# Patient Record
Sex: Male | Born: 1965 | Race: White | Hispanic: No | Smoking: Never smoker
Health system: Southern US, Community
[De-identification: ages and names within clinical notes are randomized; demographics above are authoritative.]

## PROBLEM LIST (undated history)

## (undated) DIAGNOSIS — F1110 Opioid abuse, uncomplicated: Secondary | ICD-10-CM

## (undated) DIAGNOSIS — F319 Bipolar disorder, unspecified: Secondary | ICD-10-CM

## (undated) DIAGNOSIS — F39 Unspecified mood [affective] disorder: Secondary | ICD-10-CM

## (undated) DIAGNOSIS — F329 Major depressive disorder, single episode, unspecified: Secondary | ICD-10-CM

## (undated) DIAGNOSIS — K029 Dental caries, unspecified: Secondary | ICD-10-CM

## (undated) DIAGNOSIS — F1910 Other psychoactive substance abuse, uncomplicated: Secondary | ICD-10-CM

## (undated) DIAGNOSIS — F419 Anxiety disorder, unspecified: Secondary | ICD-10-CM

## (undated) DIAGNOSIS — N179 Acute kidney failure, unspecified: Secondary | ICD-10-CM

## (undated) DIAGNOSIS — F32A Depression, unspecified: Secondary | ICD-10-CM

## (undated) DIAGNOSIS — N19 Unspecified kidney failure: Secondary | ICD-10-CM

## (undated) DIAGNOSIS — M6282 Rhabdomyolysis: Secondary | ICD-10-CM

## (undated) HISTORY — PX: HERNIA REPAIR: SHX51

## (undated) HISTORY — PX: KNEE SURGERY: SHX244

---

## 2004-06-13 ENCOUNTER — Inpatient Hospital Stay (HOSPITAL_COMMUNITY): Admission: EM | Admit: 2004-06-13 | Discharge: 2004-06-15 | Payer: Self-pay | Admitting: Emergency Medicine

## 2004-06-13 ENCOUNTER — Ambulatory Visit: Payer: Self-pay | Admitting: Internal Medicine

## 2004-08-20 ENCOUNTER — Inpatient Hospital Stay (HOSPITAL_COMMUNITY): Admission: EM | Admit: 2004-08-20 | Discharge: 2004-08-21 | Payer: Self-pay | Admitting: Emergency Medicine

## 2005-11-25 ENCOUNTER — Emergency Department (HOSPITAL_COMMUNITY): Admission: EM | Admit: 2005-11-25 | Discharge: 2005-11-25 | Payer: Self-pay | Admitting: Emergency Medicine

## 2005-11-27 ENCOUNTER — Inpatient Hospital Stay (HOSPITAL_COMMUNITY): Admission: EM | Admit: 2005-11-27 | Discharge: 2005-12-18 | Payer: Self-pay | Admitting: Emergency Medicine

## 2005-11-27 ENCOUNTER — Ambulatory Visit: Payer: Self-pay | Admitting: Internal Medicine

## 2005-12-20 ENCOUNTER — Inpatient Hospital Stay (HOSPITAL_COMMUNITY): Admission: EM | Admit: 2005-12-20 | Discharge: 2005-12-24 | Payer: Self-pay | Admitting: Emergency Medicine

## 2006-01-12 ENCOUNTER — Encounter (INDEPENDENT_AMBULATORY_CARE_PROVIDER_SITE_OTHER): Payer: Self-pay | Admitting: Ophthalmology

## 2006-01-12 ENCOUNTER — Ambulatory Visit: Payer: Self-pay | Admitting: Internal Medicine

## 2006-01-12 LAB — CONVERTED CEMR LAB
BUN: 16 mg/dL (ref 6–23)
CO2: 24 meq/L (ref 19–32)
Chloride: 106 meq/L (ref 96–112)
Sodium: 135 meq/L (ref 135–145)
Total CK: 60 units/L (ref 7–232)

## 2006-01-25 ENCOUNTER — Ambulatory Visit: Payer: Self-pay

## 2006-02-16 ENCOUNTER — Ambulatory Visit: Payer: Self-pay | Admitting: Internal Medicine

## 2006-02-16 DIAGNOSIS — Z9189 Other specified personal risk factors, not elsewhere classified: Secondary | ICD-10-CM | POA: Insufficient documentation

## 2006-02-16 DIAGNOSIS — K3184 Gastroparesis: Secondary | ICD-10-CM

## 2006-02-16 DIAGNOSIS — R030 Elevated blood-pressure reading, without diagnosis of hypertension: Secondary | ICD-10-CM

## 2006-02-16 DIAGNOSIS — M25569 Pain in unspecified knee: Secondary | ICD-10-CM

## 2006-03-27 ENCOUNTER — Ambulatory Visit: Payer: Self-pay | Admitting: Internal Medicine

## 2006-03-27 ENCOUNTER — Ambulatory Visit: Payer: Self-pay | Admitting: Infectious Diseases

## 2006-03-27 ENCOUNTER — Encounter: Admission: RE | Admit: 2006-03-27 | Discharge: 2006-03-27 | Payer: Self-pay | Admitting: Internal Medicine

## 2006-03-27 ENCOUNTER — Inpatient Hospital Stay (HOSPITAL_COMMUNITY): Admission: AD | Admit: 2006-03-27 | Discharge: 2006-04-02 | Payer: Self-pay | Admitting: Internal Medicine

## 2006-03-27 DIAGNOSIS — F1120 Opioid dependence, uncomplicated: Secondary | ICD-10-CM | POA: Insufficient documentation

## 2006-03-27 DIAGNOSIS — M6282 Rhabdomyolysis: Secondary | ICD-10-CM

## 2006-03-27 DIAGNOSIS — R110 Nausea: Secondary | ICD-10-CM

## 2006-03-27 DIAGNOSIS — F19939 Other psychoactive substance use, unspecified with withdrawal, unspecified: Secondary | ICD-10-CM

## 2006-03-27 DIAGNOSIS — N179 Acute kidney failure, unspecified: Secondary | ICD-10-CM

## 2006-03-27 DIAGNOSIS — R740 Nonspecific elevation of levels of transaminase and lactic acid dehydrogenase [LDH]: Secondary | ICD-10-CM

## 2006-03-27 DIAGNOSIS — F1410 Cocaine abuse, uncomplicated: Secondary | ICD-10-CM | POA: Insufficient documentation

## 2006-03-27 DIAGNOSIS — D649 Anemia, unspecified: Secondary | ICD-10-CM

## 2007-01-18 ENCOUNTER — Ambulatory Visit: Payer: Self-pay | Admitting: Infectious Disease

## 2007-01-18 ENCOUNTER — Inpatient Hospital Stay (HOSPITAL_COMMUNITY): Admission: EM | Admit: 2007-01-18 | Discharge: 2007-01-21 | Payer: Self-pay | Admitting: Emergency Medicine

## 2008-09-09 IMAGING — CT CT PELVIS W/O CM
2 of 3 series · 17 of 46 positions shown, 19 images · IV contrast (agent unspecified)
Comparison: None

CLINICAL DATA: Abdominal and pelvic pain.  Constipated.  Rhabdomyolysis.
 ABDOMEN CT WITHOUT CONTRAST:
TECHNIQUE: Multidetector CT imaging of the abdomen was performed following the standard protocol without IV contrast.
TECHNIQUE: Multidetector CT imaging of the pelvis was performed following the standard protocol without IV contrast.

[Series 2: abd/pelv w/o 5.0 b31f st · axial · non-contrast · 0.88mm/px · z∈[-509,-44]mm · 14 of 107 slices shown, 16 images]
[im 7/107  soft-tissue]
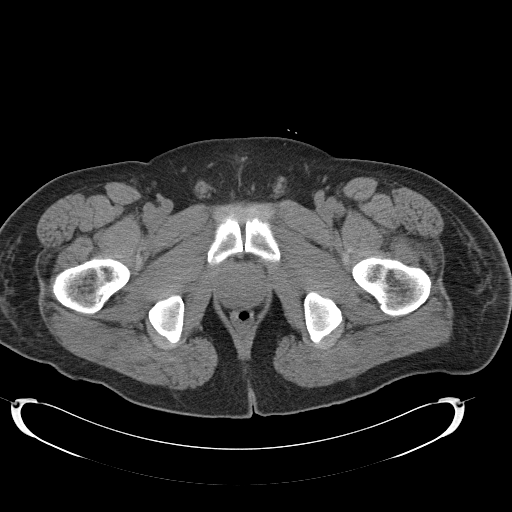
[im 7/107  bone]
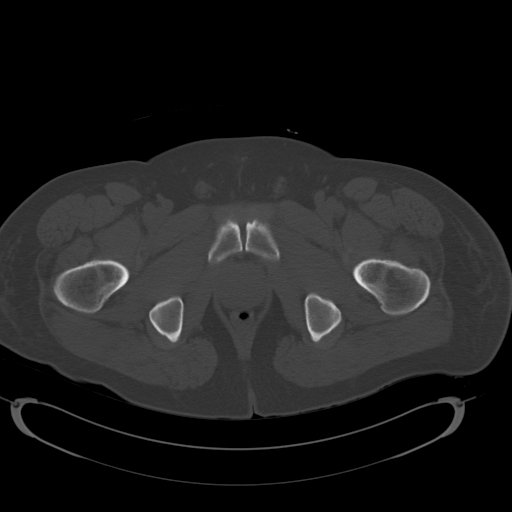
[im 14/107  soft-tissue]
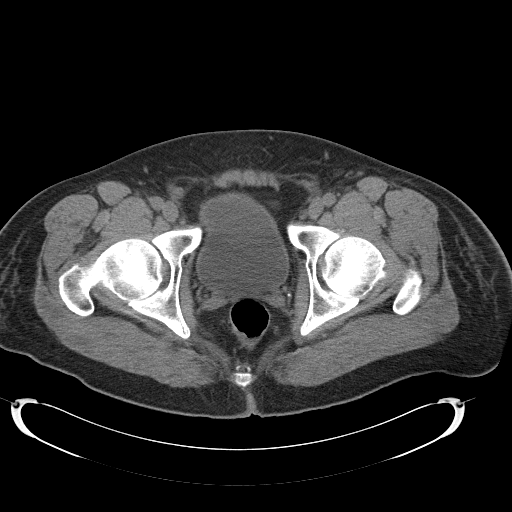
[im 21/107  soft-tissue]
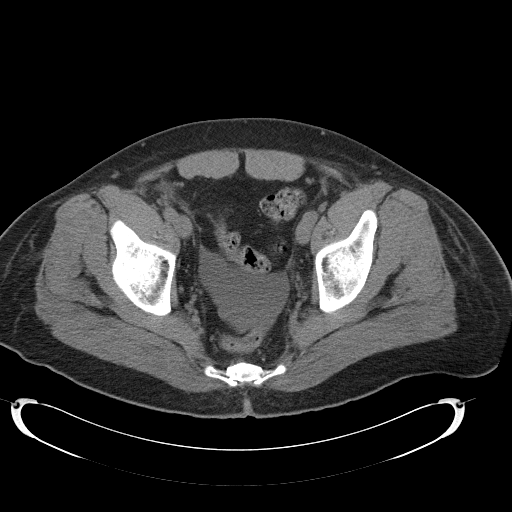
[im 28/107  soft-tissue]
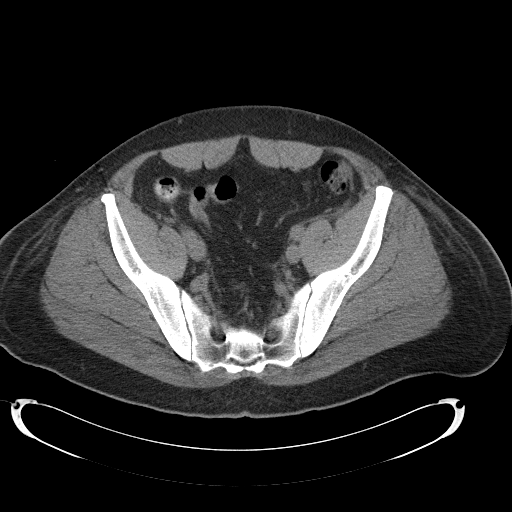
[im 35/107  soft-tissue]
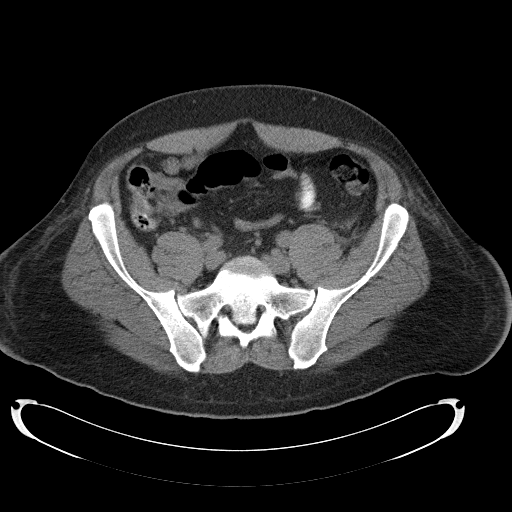
[im 42/107  soft-tissue]
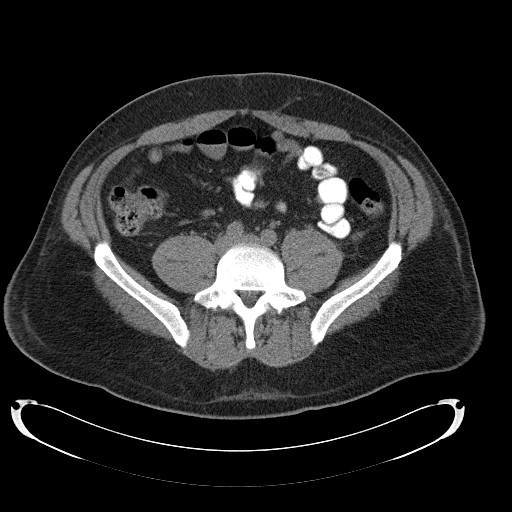
[im 48/107  soft-tissue]
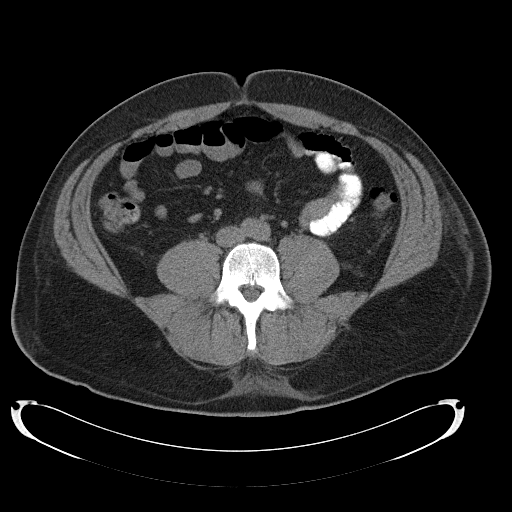
[im 59/107  soft-tissue]
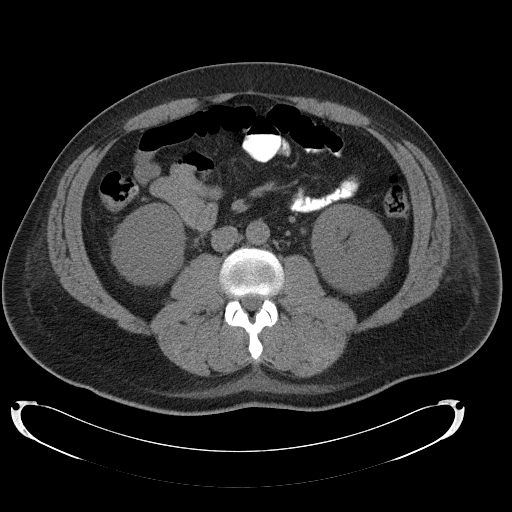
[im 65/107  soft-tissue]
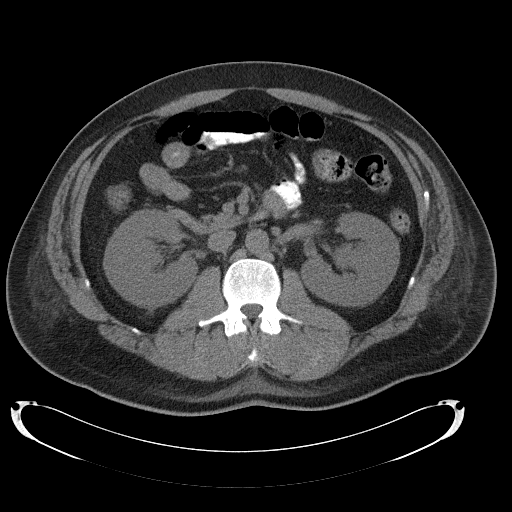
[im 65/107  bone]
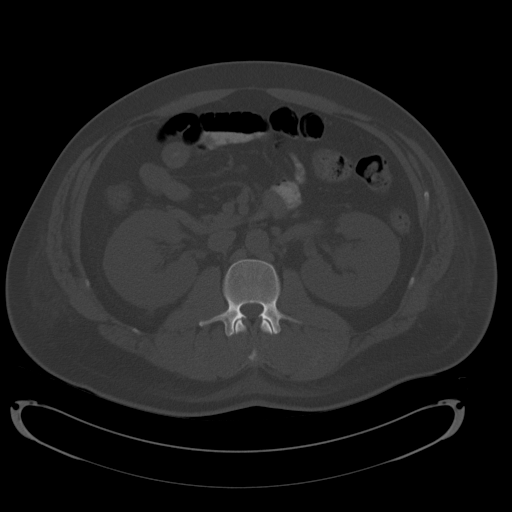
[im 72/107  soft-tissue]
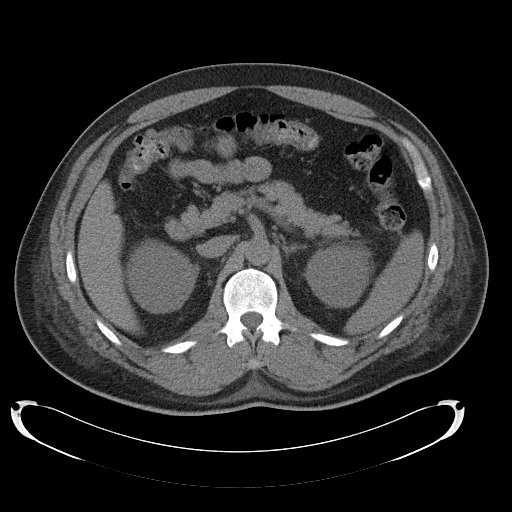
[im 79/107  soft-tissue]
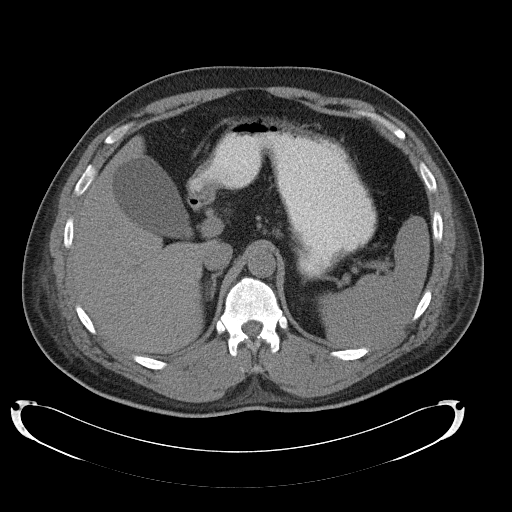
[im 86/107  soft-tissue]
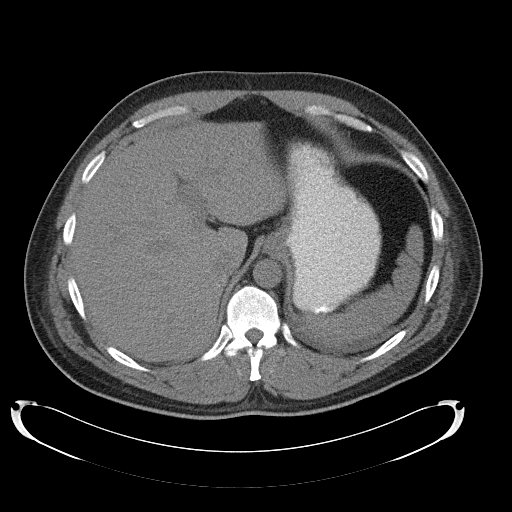
[im 93/107  soft-tissue]
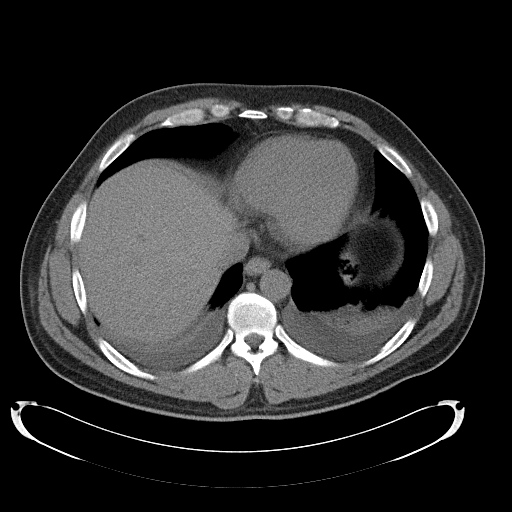
[im 100/107  soft-tissue]
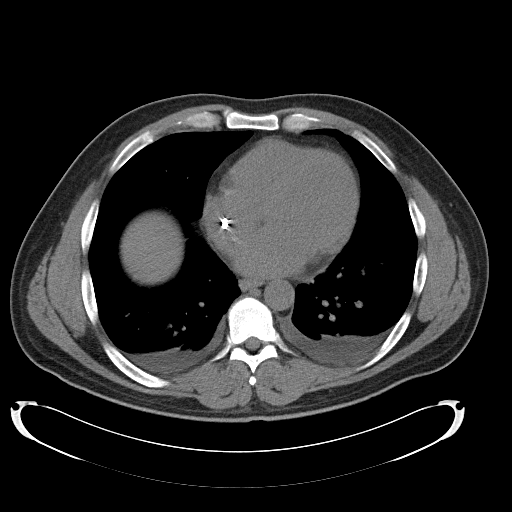

[Series 603: <mpr thick range(1)> · coronal · 1.04mm/px · 3 of 147 slices shown]
[im 49/147  soft-tissue]
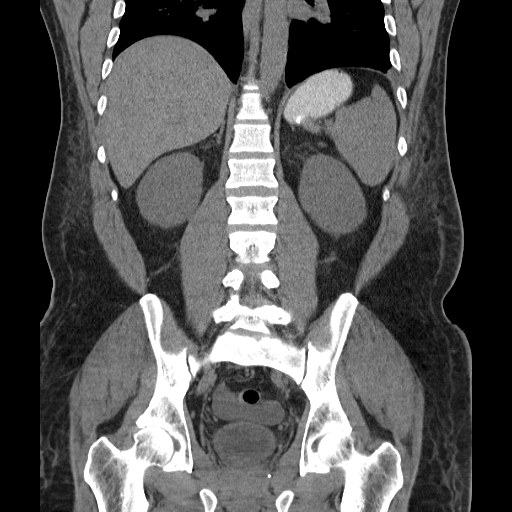
[im 65/147  soft-tissue]
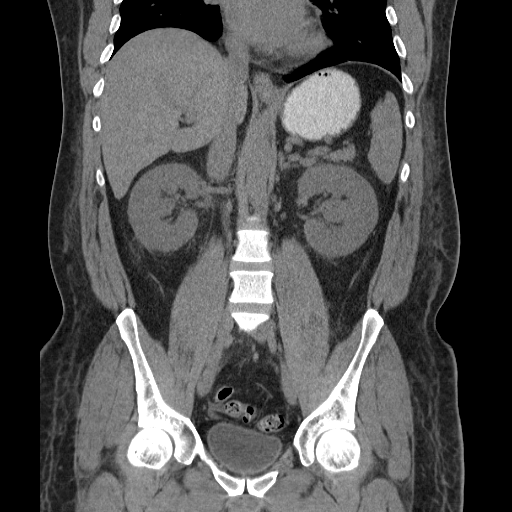
[im 82/147  soft-tissue]
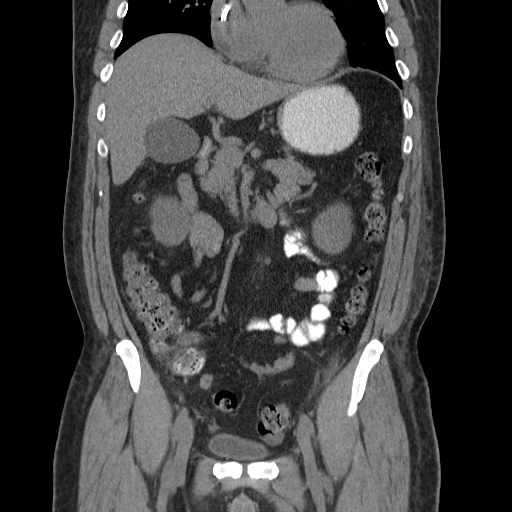

[17 of 46 positions shown; findings below may reference images not displayed]

FINDINGS: Small bilateral pleural effusions and mild basilar atelectasis identified.  The liver, gallbladder, spleen, pancreas, adrenal glands, and kidneys are within normal limits.  Please note that parenchymal abnormalities may be missed as intravenous contrast was not administered.  A mildly enlarged 1.5 cm gastrohepatic ligament node (image 19) is present.  No other enlarged lymph nodes, abdominal aortic aneurysm, biliary dilatation, or free fluid identified.  The visualized bowel is within normal limits.
IMPRESSION: 1.   Small bilateral pleural effusions and basilar atelectasis. 
 2.  Mildly enlarged gastrohepatic ligament lymph node - nonspecific.  This probably is reactive but consider limited followup of this area in 3 to 6 months. 
 PELVIS CT WITHOUT CONTRAST:
FINDINGS: A small amount of free fluid in the pelvis is identified.  No evidence of enlarged lymph nodes or masses.  The visualized bowel, appendix and bladder are within normal limits.
IMPRESSION: Small amount of free fluid without other significant abnormality.

## 2010-07-15 NOTE — H&P (Signed)
NAME:  Brandon [name], Brandon [name] NO.:  [id number]   MEDICAL RECORD NO.:  [id number]          PATIENT TYPE:  INP   LOCATION:  0105                         FACILITY:  [facility]   PHYSICIAN:  Kela Millin, M.D.DATE OF BIRTH:  1966/02/18   DATE OF ADMISSION:  08/20/2004  DATE OF DISCHARGE:                                HISTORY & PHYSICAL   PRIMARY CARE PHYSICIAN:  Unassigned.   CHIEF COMPLAINT:  Hurting all over, drug abuse.   HISTORY OF PRESENT ILLNESS:  The patient is a 45 year old white male with a  past medical history significant for cocaine abuse x 20 years, who presents  with the above complaints.  He states that for the past one week he has been  getting high daily on cocaine, and he just got to hurting everywhere and  states that he could no longer take it, so he came to the ER.  He states  that he snorts the cocaine.  The patient also reports soreness in his mouth,  stating that it has felt like he had some blisters there that have popped,  and just makes his whole mouth sore and so he has not been eating in the  past week.  He admits to generalized soreness/body aches.  Question fevers,  denies cough, dysuria, melena, and no hematochezia.  He states he has had  about 3-4 loose/watery stools per day.  The patient was seen in the ER and a  urine drug screen was positive for cocaine.  His total CPK was also  elevated, and his potassium low, and the patient is admitted the [facility] service for further evaluation and management.   PAST MEDICAL HISTORY:  As above cocaine abuse.   MEDICATIONS:  None.   ALLERGIES:  NKDA.   SOCIAL HISTORY:  Positive for cocaine x 20 years, occasional alcohol.  Denies tobacco abuse.   FAMILY HISTORY:  His mother and father both have diabetes mellitus.  His Dad  has heart disease.   REVIEW OF SYSTEMS:  As per HPI.  Other review of systems negative.   PHYSICAL EXAMINATION:  GENERAL:  The patient is a middle-aged white  male,  sleepy but easily aroused, and answers questions appropriately.  VITAL SIGNS:  Temperature 97.8 with a blood pressure of 134/77, pulse of 78,  respiratory rate of 18, O2 sat of 100%.  HEENT:  PERRL.  EOMI.  Sclerae are anicteric.  Tongue with a yellowish  shallow ulcers, also buccal mucosa with shallow ulcers noted.  NECK:  Supple.  No adenopathy.  No thyromegaly.  LUNGS:  Clear to auscultation bilaterally.  No crackles or wheezes.  CARDIOVASCULAR:  Normal S1 S2, regular rate and rhythm.  ABDOMEN:  Soft.  Bowel sounds present.  Nontender, nondistended.  No  organomegaly and no masses palpable.  EXTREMITIES:  No clubbing, cyanosis, or edema.  NEUROLOGIC:  Sleepy but easily aroused.  Alert to voice.  Cranial nerves II-  XII grossly intact.  Appropriately responsive and follows commands.  Nonfocal exam.   LABORATORY DATA:  The sodium is 136, with a potassium of 2.7,  chloride is  99, CO2 is 26, glucose is 159, BUN 8, creatinine 1.0, calcium is 8.4.  His  alcohol level is less than 5.  Urine drug screen is positive for cocaine and  otherwise negative.  His CK total is 984 with a CK-MB of 17.5.  The relative  index is 1.8.  His troponin is less than 0.05.  His white cell count is 7.4  with a hemoglobin of 12.7, hematocrit 36.4, platelet count 305, neutrophil  count 68%.   ASSESSMENT/PLAN:  1.  Cocaine abuse/intoxication.  Supportive care, intravenous fluids, Ativan      as needed.  A case management consult for community      resources/counseling.  2.  Hypokalemia.  Replace potassium, check magnesium level.  3.  Rhabdomyolysis secondary to number one.  Intravenous fluids, saline,      diuresis, follow and recheck.  4.  Mouth ulcers.  Symptomatic management.  Magic mouthwash.  Follow.       ACV/MEDQ  D:  08/20/2004  T:  08/20/2004  Job:  [id number]

## 2010-07-15 NOTE — Consult Note (Signed)
NAME:  Brandon [name], Brandon [name] NO.:  [id number]   MEDICAL RECORD NO.:  [id number]          PATIENT TYPE:  INP   LOCATION:  3311                         FACILITY:  [facility]   PHYSICIAN:  Terrial Rhodes, M.D.DATE OF BIRTH:  Dec 22, 1965   DATE OF CONSULTATION:  11/27/2005  DATE OF DISCHARGE:                                   CONSULTATION   CONSULTING PHYSICIAN:  Duncan Dull.   REASON FOR CONSULTATION:  Acute renal failure and rhabdomyolysis.   HISTORY OF PRESENT ILLNESS:  Brandon [name] is a 45 year old white male with a  past medical history significant for history of cocaine abuse who has been  clean for approximately one year and status post a 2 day binge on cocaine,  which he reports that he consumed 500 dollars worth of cocaine on Friday,  Saturday and Sunday.  He was brought to the emergency room yesterday, by  police, when he was found to be doing drugs in the bathroom.  However, he  went to triage and signed out and went back to his hotel room, did more  cocaine and passed out.  He woke up early this morning, unable to walk or  move, called police and brought him to the emergency room with diffuse  myalgias.  Patient reports that he had rust colored urine on Sunday morning  and generalized malaise.  He has had headaches, fevers, chills and inability  to move due to diffuse aches and pains.  He has a history of cocaine abuse  in the past and has a history of mild rhabdo approximately a year ago  without any renal insufficiency at that time.  He denies any suicidal  ideations.   HE HAS NO KNOWN DRUG ALLERGIES.   PAST MEDICAL HISTORY:  1. Cocaine abuse, relapsed.  2. Depression.  3. History of rhabdo due to cocaine, mild.  4. History of depression/bipolar disorder.   CURRENT MEDICATIONS:  1. Creatine supplements.  2. Trazodone 50 mg p.r.n. sleep.  3. History of Depakote, but has not taken it for over a year.   SOCIAL HISTORY:  He was living with his mother and  father in [place], but  he was kicked out on Saturday after he relapsed on cocaine.  He is  originally from [facility], [place].  Moved to [place]  approximately 2-1/2 years ago.  He was living in [place] for a year in rehab,  but was kicked out of rehab.  Moved back here to [place] for the last year  and a half.  Denies tobacco, has occasional alcohol, positive cocaine.  No  heroine.  He is single.  He has two children, one age 66 and one age 72,  alive and well in [place].  He was working in a [employer] for  several weeks, but it is unclear whether or not he has a job and is  currently homeless.   FAMILY HISTORY:  Mother is in her 59s.  She has diabetes.  Father is 17, has  heart disease, hypertension and diabetes.   REVIEW OF SYSTEMS:  In  general, he has fevers, chills, sweats, malaise.  HEENT:  He has headache.  He had a nosebleed earlier this morning.  No  vision or hearing loss.  SKIN:  No dermatology.  No rashes or lesions.  CARDIAC:  He had some chest pain earlier today, some dyspnea on exertion.  PULMONARY:  Some shortness of breath.  No hemoptysis or productive cough.  GI:  No nausea, vomiting, hematochezia, melena or bright red blood per  rectum.  GU:  No dysuria, pyuria or hematuria, but does have tea colored  urine.  RHEUMATOLOGIC:  Has diffuse myalgias and arthralgias.  All other  systems negative.   PHYSICAL EXAMINATION:  GENERAL:  This is a well-developed, well-nourished  man in moderate distress.  VITAL SIGNS:  Temperature 97.  Pulse 78.  Respiratory rate 20.  Blood  pressure 132/65.  Pulse ox 99% on room air.  HEENT EXAM:  Head normocephalic, atraumatic.  Extraocular muscles intact.  Sclerae were clear.  Oropharynx without lesions.  NECK:  Supple with full range of motion.  No  lymphadenopathy.  LUNGS:  Clear to auscultation and percussion bilaterally.  No rales or  rhonchi.  CARDIAC EXAM:  Regular rate and rhythm.  No precordial rub  appreciated.  ABDOMINAL EXAM:  Normoactive bowel sounds.  Soft.  He did have some  voluntary guarding and tenderness diffusely.  No rebound.  EXTREMITIES:  He has cold feet bilaterally with 1+ pedal pulses.  No edema.  No rashes.  NEUROLOGIC:  He was awake, alert and oriented x3.  Cranial nerves II-XII are  grossly intact.  No asterixis.   LABS:  His white blood cell count is 11.0, hemoglobin 13.3, platelets 280,  sodium 130, potassium 4.4, chloride 94, CO2 19, BUN 101, creatinine 9.5,  glucose 131, calcium 6.8, albumin 3.3, phosphorus 9.6, magnesium 2.9, total  protein 6.3, AST 2651, ALT 871.  CPKs were greater than 50,000.   ASSESSMENT/PLAN:  1. Acute renal failure, secondary to rhabdomyolysis.  Patient is currently      anuric.  We will continue with IV fluids with normal saline.  We will      recheck his labs today to see if he is developing metabolic acidosis.      He currently appears hypovolemic and will continue with IV fluids for      now.  We will follow CPKs and urine output.  He has a Foley catheter in      place.  We will hold off on dialysis; however, if his BUN and      creatinine continue to rise or if he develops hyperkalemia, we will      likely need to proceed with hemodialysis.  He is currently hemodynamic      stable and without emergent indication for dialysis.  2. Hypocalcemia, secondary to rhabdomyolysis.  We will continue with      supportive therapy.  We will hold off on bicarbonate drip until his      calcium stabilizes.  We may want to add calcium carbonate with his      meals to help treat his hyperphosphatemia.  We will, otherwise, hold      off on IV calcium for now unless he becomes symptomatic.  3. Elevated liver function tests.  Question of whether or not this is      related to his rhabdomyolysis versus medications such as trazodone.  He      denies alcohol, as ethanol level was less than 5.  He denies  any other     agents.  It is unclear whether or  not the creatine may have compounded      the issue.  We will continue to follow.  4. Rhabdomyolysis as above.  We will continue with supportive therapy.  5. Chest pain.  We will cycle CPKs, MBs and troponins.  Follow the EKGs      now and again in the morning and consider cardiology if his MBs and      troponins continue to rise.  6. Depression.  He is not suicidal.  We will continue to follow.  7. Cocaine abuse.  Continue with supportive therapy.  Avoid beta blockers  8. Metabolic acidosis, secondary to number 1.  Continue to follow.  We      will hold off on bicarbonate drips since his calcium is declining and      we will continue to follow.   Thank you for this consult.           ______________________________  Terrial Rhodes, M.D.     JC/MEDQ  D:  11/27/2005  T:  11/28/2005  Job:  [id number]

## 2010-07-15 NOTE — H&P (Signed)
NAME:  DARRIAN, GRZELAK NO.:  [id number]   MEDICAL RECORD NO.:  [id number]          PATIENT TYPE:  INP   LOCATION:  3041                         FACILITY:  [facility]   PHYSICIAN:  Antonietta Breach, M.D.  DATE OF BIRTH:  December 06, 1965   DATE OF ADMISSION:  03/27/2006  DATE OF DISCHARGE:                              HISTORY & PHYSICAL   INPATIENT CONSULTATION:   REQUESTING PHYSICIAN:  Tinnie Gens C. Ninetta Lights, M.D.   REASON FOR CONSULTATION:  1. Heroin and cocaine addiction, physiologic dependence with heroin.  2. Anxiety.   HISTORY OF PRESENT ILLNESS:  Mr. Billal Rollo is a 45 year old male  admitted to the [facility] on the 29th of January 2008 due to  acute renal insufficiency from rhabdomyolysis.   The patient developed rhabdomyolysis secondary to his cocaine abuse.  He  also developed a transaminitis due to Tylenol ingestion which was  associated with his acetaminophen/oxycodone combinations.  He has been  using regular IV heroin and took high quantities of Percocet over the  week prior to admission as well as Vicodin.  It is estimated that he  took a total of 40 Percocet, 32 Tylox, and 80 Vicodin over 5 days along  with cocaine.  There was no suicidal intent.  This was all for the  purpose of altering his mental status into a high.   The patient has had excessive feeling on edge.  He has had much worry  and muscle tension.  He is having less anxiety now on the day of  evaluation and is hopeful about recovery.  His interests are within  normal limits.  He has no hallucinations or delusions.  He is encouraged  and not having as much worry after his current medical report of his  recovering kidney function.   He has a constructive goal of substance rehabilitation.   PAST PSYCHIATRIC HISTORY:  Mr. Tisdale does have a history of multiple  rehabilitation programs for substance dependence.  These have involved  uses of antidepressants at times which he said gave  him weird side  effects and were not associated with any improvement in symptoms.  He  has no history of mania, he has no history of elevated moods independent  of substance abuse, he has no history of hallucinations or delusions.   He did make a suicide attempt at age 31; however, this was while highly  intoxicated on cocaine.   FAMILY PSYCHIATRIC HISTORY:  None known.   SOCIAL HISTORY:  The patient has no history of marriage; however, he did  live with a woman for some time and has a child with the child's mother  in the [place].  The patient has been living in [place] for some  time.  His parents are visiting him in the hospital and are supportive.  His religion is Catholic.  Regarding other illegal drugs, he states that  he has done all drugs with experimentation that he has been able to  acquire, including acid, crystal meth.  He states that his drugs of  choice are powder cocaine and heroin, although he has  done alcohol and  marijuana regularly as well.  His occupation was in [employer].   GENERAL MEDICAL PROBLEMS:  1. Acute renal insufficiency, improved.  2. Rhabdomyolysis, improved.  3. Transaminitis, improved.   The undersigned has been advising the medical team having started the  clonidine opioid withdrawal protocol.  The patient has responded well  without adverse effects.  He has been on diclofenac 100 mg t.i.d. for  pain.  He has not been on any opioids.  He has undergone intravenous  fluid treatment.  His INR is 1.0, WBC 4.6, hemoglobin 11, platelet count  228.  The potassium is 3.9, BUN 16, creatinine is 1.28.  The SGOT has  decreased from 229 down to 122.  SGPT has decreased from 161 down to  124.  His total protein is 5.2, albumin is 2.5.  His creatinine kinase  has decreased from 5135 down to 2186.  His initial Tylenol level was  negative, aspirin level negative.  His urine drug screen was positive  for cocaine, positive for opiates.  His THC was notably  negative.   REVIEW OF SYSTEMS:  CONSTITUTIONAL:  Afebrile.  HEENT:  Head:  No  trauma.  Eyes:  No visual changes.  Ears:  No hearing impairment.  Nose:  No rhinorrhea.  Mouth/Throat:  No sore throat.  NEUROLOGIC:  Unremarkable.  PSYCHIATRIC:  As above.  CARDIOVASCULAR:  No chest pain,  palpitations, or edema.  RESPIRATORY:  No coughing or wheezing.  GASTROINTESTINAL:  No nausea, vomiting, diarrhea.  GENITOURINARY:  No  dysuria.  SKIN:  Unremarkable.  MUSCULOSKELETAL:  The patient has slight  general myalgias but improving.  ENDOCRINE/METABOLIC:  He is not  sweating nor does he have any goose flesh.  He is having no adverse  effects with the clonidine.  MUSCULOSKELETAL:  No deformities,  weaknesses, or atrophy.  See the laboratory data above.  HEMATOLOGIC/LYMPHATIC:  Unremarkable.   EXAMINATION:  VITAL SIGNS:  Temperature 99.4, pulse 63, respiration 14,  blood pressure within normal limits, O2 saturation 100% on room air.  GENERAL:  The patient has no sweating.  EYE EXAM:  Reveals no mydriasis.   MENTAL STATUS EXAM:  Mr. Mahnke is a middle-aged male appearing his  chronologic age, lying in a partially-reclined supine position in his  hospital bed.  He is oriented to all spheres.  He is well groomed.  His  eye contact is good.  His speech involves normal rate and prosody.  Psychomotor tone is within normal limits.  Thought process, logical,  coherent, goal directed, no looseness of association or thought content,  no thoughts of harming himself, no thoughts of harming others, no  delusions, no hallucinations.  His fund of knowledge and intelligence  are within normal limits.  His affect is slightly anxious at baseline  but with a broad appropriate response.  His mood is slightly anxious.  His concentration is within normal limits.  His insight is good.  His  judgment is within normal limits.  His memory is intact to immediate, recent, and remote.  He is oriented to all spheres.    ASSESSMENT:  Axis I:    a.  Opioid dependence, cocaine dependence.    b.  293.84 anxiety disorder not otherwise specified (functional and  general medical as well as withdrawal factors) now stable without  treatment necessary other than his psychosocial and 12-step tools.  Axis II:  Deferred.  Axis III:  See General Medical Problems.  Axis IV:  Primary support group and general  medical.  Axis V:  55.   Mr. Terrance is not at risk to harm himself or others.  He agrees to call  emergency services for any emergency psychiatric symptoms.   RECOMMENDATION:  1. The patient is appropriate for a residential chemical dependency      program for helping the patient to develop relapse prevention      skills as well as coping skills and stress management.  2. Would finish the clonidine withdrawal protocol.  3. No other psychotropic medication is needed.  4. 12-step method.      Antonietta Breach, M.D.  Electronically Signed     JW/MEDQ  D:  03/31/2006  T:  04/01/2006  Job:  [id number]

## 2010-07-15 NOTE — Discharge Summary (Signed)
NAME:  Brandon, [name] NO.:  [id number]   MEDICAL RECORD NO.:  [id number]          PATIENT TYPE:  INP   LOCATION:  3041                         FACILITY:  [facility]   PHYSICIAN:  Alvester Morin, M.D.  DATE OF BIRTH:  June 21, 1965   DATE OF ADMISSION:  03/27/2006  DATE OF DISCHARGE:  04/02/2006                               DISCHARGE SUMMARY   ADDENDUM TO PREVIOUS DISCHARGE SUMMARY DICTATED ON March 30, 2006.   The patient's discharge was delayed because of bed availability at [name].  During this time, the patient remained stable.  He showed no  signs or symptoms of drug or alcohol withdrawal.  However, he was found  to be hypomagnesemic with a magnesium of 1.2.  This was repleted  intravenously and was normal at 1.8 on the morning of discharge.  The  patient's potassium was also repleated orally.   Brandon [name] left the hospital in stable condition.  He was transported to  [name] for [facility] drug [facility]hab.  Followup should be performed at  [name].  The patient will need a basic metabolic panel and creatinine  kinase levels 1 week after discharge to monitor his renal function.   DISCHARGE MEDICATIONS:  Multivitamin 1 tablet daily.   DISCHARGE VITALS:  Temperature 97.3, blood pressure 142/75, pulse 69,  respirations 20, oxygen saturation 97% on room air.   DISCHARGE LABS:  Sodium 139, potassium 4.0, chloride 109, bicarbonate  26, BUN 12, creatinine 1.1, glucose 97, magnesium 1.8.      Brandon [name], M.D.  Electronically Signed      Alvester Morin, M.D.  Electronically Signed    CE/MEDQ  D:  04/02/2006  T:  04/03/2006  Job:  [id number]

## 2010-07-15 NOTE — Discharge Summary (Signed)
NAME:  Brandon [name], Brandon [name] NO.:  [id number]   MEDICAL RECORD NO.:  [id number]          PATIENT TYPE:  INP   LOCATION:  2035                         FACILITY:  [facility]   PHYSICIAN:  Antony Contras, M.D.     DATE OF BIRTH:  1965-12-27   DATE OF ADMISSION:  12/20/2005  DATE OF DISCHARGE:  12/24/2005                                 DISCHARGE SUMMARY   DISCHARGE DIAGNOSES:  1. Atypical chest pain, ruled out by EKG and cardiac markers, low      probability V/Q scan.  2. Viral gastroenteritis.  3. Mild gastroparesis.  4. History of acute renal failure requiring dialysis secondary to      rhabdomyolysis caused by cocaine abuse.  5. Polysubstance abuse.  6. Depression.   CURRENT MEDICATIONS:  1. Protonix 40 mg p.o. b.i.d.  2. Reglan 10 mg p.o. b.i.d.  3. Neurontin 300 mg p.o. t.i.d.  4. Ambien CR 6.25 mg q.h.s. p.r.n. sleep with instructions on not to use      the next day if the patient does not get 7-8 hours of sleep the day      prior.   DISPOSITION AND FOLLOWUP:  The patient will follow up with Dr. Randon Goldsmith in the  outpatient clinic the first week of November.  Issues to be addressed at  that time include full resolution of his gastrointestinal discomfort, as  well as diarrhea.  I will also follow up with a BMET to ensure the patient's  renal function is stable.  The patient needs an outpatient stress test,  given his history of cocaine abuse and chest pain.  I will also ask the  patient about his attempt at remaining free of cocaine and other drug abuse.  I recently discharged this patient from the hospital the week prior and made  many referrals, including to the drug rehab outpatient treatment center in  [facility], as well as narcotics anonymous.  I will be certain to ask him if  he has 1) attended the appointments I have set up for him and 2) if he has  been successful in refraining from drug use.   PROCEDURES PERFORMED:  1. Plain film of the abdomen on December 20, 2005, revealed a normal gas      pattern.  2. Portable chest x-ray obtained on December 20, 2005, revealed no active      cardiopulmonary disease.  3. A CT of the pelvis and abdomen on December 22, 2005, revealed no acute      findings.  No evidence for pancreatitis.  Mild bilateral perinephric      fat surrounding is nonspecific, but unchanged from prior exam.      Examination of the pelvis revealed no acute findings.  4. On December 22, 2005, the patient had a V/Q scan, which was interpreted      as low probability for pulmonary embolus.   CONSULTATIONS:  None.   BRIEF ADMISSION HISTORY AND PHYSICAL:  Brandon [name] is a 45 year old white man  well known to me from a recent discharge from the hospital secondary to  rhabdomyolysis secondary to cocaine binge, causing renal failure requiring  dialysis, who returned to the [facility] ED with complaints of chest pain  and diarrhea that started 2 days prior to admission.  The chest pain is  described as muscle soreness, similar to the pain he used to have when  lifting weights.  It is located across his chest and diaphragm and radiates  to his left shoulder.  The chest pain is also pleuritic in nature.  When not  moving or breathing deeply, the pain is not present.  He also has some  generalized weakness.  The patient's second complaint is that of diarrhea.  He reports that he has had presently 14 stools by his count over the last 24  hours.  They are watery in nature.  There is no blood.  He denies fever or  chills.  He also had some vomiting that started today while in the ED after  pain med was administered.  He is unable to take anything by mouth.  He does  have headache.  No cough, no sputum.  He has mild dyspnea on exertion.  He  denies any shortness of breath at rest, or syncope.  Please see the chart  for further details of this admission.   PHYSICAL EXAMINATION:  VITAL SIGNS:  Temperature 97, blood pressure 141/95,  pulse 86,  respiratory rate 20.  O2 sats were 98% on room air.  GENERAL:  Anxious-appearing middle-aged man.  HEENT:  Pupils are equally round and reactive to light.  Extraocular muscles  are intact.  There is no scleral icterus.  Posterior oropharynx was dry.  He  had poor dentition.  NECK:  Mild tenderness to palpation.  LUNGS:  Clear to auscultation bilaterally.  No wheezes, rhonchi, or rales.  CARDIOVASCULAR:  Regular rate and rhythm.  No murmurs, rubs, or gallops.  He  had a normal S1 and S2 appreciated.  ABDOMEN:  Soft.  He had active bowel sounds.  He had mild tenderness to  palpation.  No guarding or rebound though.  EXTREMITIES:  He was moving all extremities normally.  He had pain with  range of motion in his left shoulder.  He had 5/5 strength in the bilateral  upper and lower extremities.  SKIN:  He had no breaks or rashes on the skin.  He had no appreciable  lymphadenopathy.   ADMISSION LABORATORY DATA:  Lipase was elevated at 145.  Point-of-care  cardiac markers were normal with a troponin-I of less than 0.01, CK 53, CK-  MB 2.6.  White blood cell count was 8.7, hemoglobin 11.5, platelets 437.  He  had a sodium of 136, potassium 3.8, chloride 107, bicarb 19, BUN 44,  creatinine 2.7.  Of note, his creatinine was 4.2 on December 18, 2005.  He  had an anion gap of 10, bilirubin of 0.4, alk phos 60, AST 27, ALT 38,  protein 7.2, albumin 3.4, calcium 9.0.  He had an EKG performed, which  revealed T-wave inversions in V1 compared to November 28, 2005.  Again, he had  an abdominal series that revealed some stool in the right colon, otherwise a  normal bowel gas pattern.  The second and third sets of cardiac markers were  normal.  Review of his old EKGs revealed that there were T-wave inversions  of V1 as well.   HOSPITAL COURSE:  1. Atypical chest pain.  The patient was ruled out for any ischemic events     by normal cardiac  enzymes and unchanged EKG.  Given the patient's chest      pain  was pleuritic in nature, the primary team pursued with ruling out      a pulmonary embolism.  A D-dimer had been obtained, which was elevated      at 0.88.  We then proceeded with ordering a V/Q scan; however, the      patient refused to proceed with the V/Q scan for about 1-1/2 days.  The      patient eventually was spoken to by an upper resident, who convinced      him to go forth with the V/Q scan.  This was completed and again      interpreted as low probability.  The primary team felt comfortable in      ruling out pulmonary embolism as the cause of his chest pain.  It was      unclear the source of his chest pain.  The patient did report that it      was often related to dyspepsia and was relieved with burping.      Certainly, reflux-associated symptoms could be a cause.  On the day of      discharge, the patient was no longer having any chest pain and was      stable.  Given this patient's history of chronic cocaine abuse, I will      set him up for an outpatient Myoview in order to risk stratify this      patient.   1. Nausea, vomiting, and diarrhea.  The patient was admitted, administered      IV fluids, and treated symptomatically for vomiting.  The patient was      known from past admission after an emptying study to have mild      gastroparesis.  Likely, the etiology of this acute onset of vomiting      and diarrhea is a viral gastroenteritis.  By the second day of      admission was tolerating an oral diet well.  The question was raised      again of an elevated lipase in the setting of acute renal failure.  A      CT of his abdomen and pelvis, which as above, revealed no evidence of      pancreatitis.  I would not list pancreatitis on any of his discharge      summaries or admissions in the future.  The patient was treated with      Reglan to promote gastric motility and emptying.  He tolerated this      well and was discharged on the same.   1. Acute renal insufficiency.   The patient does have a history of cocaine      abuse induced rhabdomyolysis requiring hemodialysis in his last      admission.  On this admission, his creatinine as stated on admission,      was 2.7, which is down from 4.2.  With continued fluid resuscitation,      his creatinine continued to trend down and actually on the discharge      was at 1.8.  I think this patient is fortunate to have not suffered      from chronic renal damage and again I will follow his creatinine with a      BMET at my clinic visit with him.   1. Polysubstance abuse.  The patient's urine drug screen on this admission  was negative, which is reassuring.  Again, I have referred him to      several outpatient agencies that will help with him in dealing with his      narcotics addiction.  I will see him in the outpatient clinic to assess      how well he has been doing with this.  On the day of discharge, temperature was 98.6, blood pressure 152/81, pulse  63, respiratory 20.  O2 sats were 96% on room air.  White blood cells 8.2,  hemoglobin 11.2, platelets 418.  Sodium 138, potassium 4.3, chloride 107,  bicarb 23, BUN 21, creatinine 1.8, glucose 86, calcium 9.1.      Antony Contras, M.D.  Electronically Signed     GL/MEDQ  D:  12/28/2005  T:  12/29/2005  Job:  [id number]
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
## 2010-07-15 NOTE — Discharge Summary (Signed)
NAME:  Brandon [name], Brandon [name] NO.:  [id number]   MEDICAL RECORD NO.:  [id number]          PATIENT TYPE:  INP   LOCATION:  3731                         FACILITY:  [facility]   PHYSICIAN:  Reggie Pile, MD     DATE OF BIRTH:  [birth year]   DATE OF ADMISSION:  06/13/2004  DATE OF DISCHARGE:  06/15/2004                                 DISCHARGE SUMMARY   DISCHARGE DIAGNOSES:  1.  Mild rhabdomyolysis secondary to muscle exertion and cocaine abuse.  2.  Cocaine abuse.  3.  History of suicide attempt 10 years ago.  4.  History of depression.   DISCHARGE MEDICATIONS:  None.   PROCEDURE PERFORMED:  None.   BRIEF ADMISSION HISTORY AND PHYSICAL:  The patient is a 45 year old white  male with history of cocaine abuse whose last binge was six months ago and  prior history of long term rehab for cocaine abuse, comes into the emergency  room on the day of admission after a three-day cocaine bandage and walking  the streets.  He reports he has not slept in three days. He reports that he  snorts but does not smoke cocaine. He reports that he has been barefoot for  the past several days but he is not sure why. He came in because he his feet  were sore and his legs were becoming so weak that he could not walk. He has  not eaten anything in several days.   ALLERGIES:  No known drug allergies.   MEDICATIONS:  None.   SUBSTANCE HISTORY:  Never smoked.  He has uses occasional alcohol, last use  was three days prior to admission where he had six beers. He has a history  of cocaine abuse but no IV drug abuse.   SOCIAL HISTORY:  He is single and he works as a Optician, dispensing and he is  __________ on his medication. He lives with parents since January and  previously lived in [place].   FAMILY HISTORY:  Mother and father are both alive with no known history  other than diabetes in the family.   REVIEW OF SYSTEMS:  Negative for suicidal ideation or homicidal ideation.   PHYSICAL  EXAMINATION:  VITAL SIGNS:  Temperature 97.1, pulse 72,  respirations 20, blood pressure 118/61, O2 sats 97% on room air.  GENERAL: He is well-developed, well-nourished white male in no acute  distress.  HEENT: PERRLA, no scleral icterus. Oropharynx clear. Dry mucous membranes.  NECK: Supple with no thyromegaly.  CHEST:  Clear to auscultation bilaterally.  CARDIAC:  Regular rate and rhythm without murmur.  ABDOMEN:  Soft, nontender, nondistended with positive bowel sounds.  EXTREMITIES:  No edema, clubbing, cyanosis or edema, pain to palpation of  calves and lower thighs  SKIN:  Right forearm longitudinal scar on dorsal aspect. well-healed right  heel blister 3 x 2 cm with excoriations to the left plantar aspect of foot.  NEUROLOGIC: Cranial nerves II-XII intact without deficit. Moving all  extremities with  significant weakness and bilateral lower extremities.  PSYCHIATRIC:  No suicidal ideation and does not currently  feel depressed.   ADMISSION LABORATORY:  Sodium 138, potassium 3.5, chloride 105, CO2 25, BUN  13, creatinine 1.0, glucose 128. CK level was 5833. Myoglobin was 353 and CK-  MB was 19.1.   Chest x-ray shows was normal with no acute disease.   HOSPITAL COURSE BY ACTIVE PROBLEM:  PROBLEM #1 -  RHABDOMYOLYSIS/MYOSITIS:  It is most likely secondary to his cocaine use and the fact that he had  apparently been walking 15 miles without eating or drinking much over the  past several days. He was aggressively fluid hydrated after being given a  bolus of normal saline and he responded quite well as CKs continued to trend  down and on the day of discharge was 900. He had no signs of renal failure  or insufficiency at all throughout this admission.  PROBLEM #2 -  COCAINE ABUSE:  The patient was counseled repeatedly on  cessation of cocaine and he was given information on alcohol and drugs  services. Social work was consulted and discussed cessation with him. The  patient has been  in rehab before and says now he cannot go into a rehab  facility secondary to not having insurance.   DISPOSITION AND FOLLOW-UP:  The patient was given the number for the [facility] although he is not actively a clinic patient at this  time.  There was no obvious medical need for scheduled follow-up at this  time.   DISCHARGE LAB RESULTS:  On the day of discharge the patient had a CK of 997.  A BMET showed a sodium of 139, potassium 4.2, chloride 108, CO2 25, BUN 5,  creatinine 0.8, glucose 84, calcium 8.2. A urine culture showed a 15,000  colonies with multiple species present with no uropathogens.  Urine drug  screen was positive for cocaine only.  Phosphorus was low at 2.1 and  repleted.  TSH was 1.775.  Cardiac point of care markers were negative x1.      WW/MEDQ  D:  06/15/2004  T:  06/15/2004  Job:  [id number]

## 2010-07-15 NOTE — Discharge Summary (Signed)
NAME:  Brandon [name], Brandon [name] NO.:  [id number]   MEDICAL RECORD NO.:  [id number]          PATIENT TYPE:  INP   LOCATION:  4710                         FACILITY:  [facility]   PHYSICIAN:  Acey Lav, MD  DATE OF BIRTH:  1965/03/23   DATE OF ADMISSION:  01/18/2007  DATE OF DISCHARGE:  01/21/2007                               DISCHARGE SUMMARY   DISCHARGE DIAGNOSES:  1. Acute renal failure secondary to rhabdomyolysis, resolved.  2. Rhabdomyolysis secondary to cocaine abuse.  3. Mild transaminitis, secondary to rhabdomyolysis, resolved.   DISCHARGE MEDICATIONS:  None.   DISPOSITION AND FOLLOW-UP:  Patient was discharged in hemodynamically  stable condition.  He denied any suicidal and/or homicidal ideation at  discharge.  Brandon [name] refused to follow up in our outpatient clinic  stating that he would find a PCP in the [facility] area.  He also  stated that he would make an appointment with a psychiatrist.   CONSULTATIONS:  None.   PROCEDURES:  1. Abdomen and pelvic CT scan without contrast on January 18, 2007      showed no significant abnormalities.  No renal calculi or      hydronephrosis was noted.  Bladder was decompressed with Foley      catheter.  2. Portable chest x-ray on January 20, 2007 showed cardiomegaly, low      lung volumes and vascular crowding, as well as mild central venous      pulmonary congestion.   ADMISSION HISTORY:  Brandon [name] is a 45 year old man with a history of  cocaine induced rhabdomyolysis x 3 requiring temporary hemodialysis in  the past who presented to the [facility] emergency department with  complaints of 24 hours of lower back pain.  He explained having been on  a cocaine binge for 72 hours prior to admission and had not urinated for  the last 3 to 4 days.  He developed lower back pain that he described as  sharp, radiating everywhere in his abdomen.  On admission, he stated he  wanted to die and that he was trying to let  himself die, but he  decided to call 911 after his fiance begged him to.   ADMISSION PHYSICAL EXAM:  VITAL SIGNS:  Temperature 96.9, blood pressure  149/80, heart rate 94, respiratory rate 40, O2 sat 96% on room air.  GENERAL:  Patient was somnolent, but arousable.  HEENT:  Pupils equal, round, react to light.  EOMI.  Oropharynx is  clear.  NECK:  Supple, without lymphadenopathy or thyromegaly.  LUNGS:  Clear to auscultation bilaterally.  HEART:  Regular rate and rhythm.  No murmurs, rubs or gallops.  ABDOMEN:  Soft, nontender, nondistended.  No palpable masses.  Positive  bowel sounds.  No CVA tenderness.  NEURO:  Grossly nonfocal.  The patient was unable to maintain alertness  long enough to follow commands.  He was hallucinating, grabbing at  objects that were not there, and was initially fearful.   ADMISSION LABS:  Sodium 137, potassium 3.9, chloride 105, bicarb 22, BUN  22, creatinine 1.__________ , glucose 110,  anion gap 10, bilirubin 1.9,  alkaline phosphatase 48, AST 152, ALT 53, protein 7.3, albumin 4.3,  white blood count 13.6, hemoglobin 15.5 with an MCV of 88, creatinine  355, total CK 6557.  UDS  positive for cocaine.  UA revealed 0-2 white  blood cells and red blood cells, as well as granular casts.   HOSPITAL COURSE:  1. Acute renal failure secondary to cocaine induced rhabdomyolysis,      recurrent.  On admission, a Foley catheter was placed given      patient's urinary retention.  Patient was hydrated, and his      creatinine progressively decreased back to normal.  He did not      require hemodialysis on this admission.  2. Cocaine induced rhabdomyolysis.  On admission, his total CK was      around 6500.  The value improved with hydration alone.  No Lasix of      sodium bicarbonate was needed.  UDS was positive for cocaine, which      was consistent with patient's history.  3. Mild transaminitis.  This was felt to be secondary to the elevated      AST in setting  of rhabdomyolysis.  The liver enzymes trended back      to normal with hydration.  4. Questionable suicidal ideation.  On admission, patient voiced      desire to die, but he was intoxicated at the time.  Once patient      was sober, he denied any suicidal ideations.  He did endorse a      depressed mood and explained that he had been stressed out at work,      which is why he relapsed and used cocaine.  Of note, an      acetaminophen level and salicylate level were drawn and were not      elevated.   LABS:  Sodium 142, potassium 3.5, chloride 109, bicarb 25, BUN 9,  creatinine 1.3, glucose 90, bilirubin 0.5, alkaline phosphatase 35, AST  24, ALT 25, total protein 5.4, albumin 2.8, calcium 8.3.   DISCHARGE VITALS:  Temperature 97.1, heart rate 74, respiratory rate 16,  blood pressure 148/92, O2 sat 93% on room air.      Olene Craven, M.D.  Electronically Signed      Acey Lav, MD  Electronically Signed    MC/MEDQ  D:  02/28/2007  T:  02/28/2007  Job:  [id number]

## 2010-07-15 NOTE — Discharge Summary (Signed)
NAME:  Brandon [name], STANDRE NO.:  [id number]   MEDICAL RECORD NO.:  [id number]          PATIENT TYPE:  INP   LOCATION:  3041                         FACILITY:  [facility]   PHYSICIAN:  Lacretia Leigh. Hatcher, M.D.DATE OF BIRTH:  05-14-1965   DATE OF ADMISSION:  03/27/2006  DATE OF DISCHARGE:  03/30/2006                               DISCHARGE SUMMARY   DISCHARGE DIAGNOSES:  1. Acute renal insufficiency secondary to rhabdomyolysis secondary to      cocaine abuse.  2. Rhabdomyolysis secondary to cocaine abuse.  3. Transaminitis secondary to Tylenol ingestion and rhabdomyolysis.  4. Opiate dependence, cocaine dependence.  5. Psychiatry issues mainly anxiety disorder.   DISCHARGE MEDICATIONS:  1. Clonidine 0.1 mg p.o. b.i.d. March 30, 2006.  2. Clonidine 0.1 mg p.o. daily starting March 31, 2006. To be      stopped April 01, 2006.  3. Multivitamin.  4. Diclofenac 100 mg p.o. t.i.d. p.r.n. for pain.   DISCHARGE DISPOSITION:  Patient is to be transferred to [facility] for detoxification.   FOLLOWUP:  Patient needs a basic metabolic panel in order to check renal  function as well as CK levels one week from date of discharge to check  if digoxin levels have come back to normal. The patient was encouraged  to drink a lot of fluids.   CONSULTANTS THIS ADMISSION:  Dr. Antonietta Breach, psychiatry.   IMAGES DONE THIS ADMISSION:  None.   PROCEDURES DONE THIS ADMISSION:  None.   BRIEF HISTORY AND PHYSICAL:  Please see medical records for full  details. [name] is a 45 year old white man with past medical history  of polysubstance abuse, history of acute renal failure, and  rhabdomyolysis hospitalized in October 2007 and had to be dialyzed for  the same and was cleared and returned to normal. Had been discharged.  Presented to the outpatient clinic for medical clearance to [facility] for  opiate detoxification. Per patient he is status post right knee  arthroscopy, and five days prior to admission he took several Percocet  and Vicodin and cocaine for his pain. The patient took a total of 40  Percocet, 32 Tylox, 80 Vicodin, and cocaine over five days prior to  admission. In outpatient patient was found via lab work to be in  rhabdomyolysis and acute renal failure with creatinine 2.2 and baseline  creatinine 1.3 and patient complaining of myalgia.   ALLERGIES:  Questionable KIDNEY BEAN.   PAST MEDICAL HISTORY:  Acute renal failure and rhabdomyolysis secondary  to cocaine abuse in October 2007 and also another hospitalization in  April 2006 for the same. History of suicide attempt 10 years prior to  admission. History of depression, anxiety disorder, ongoing cocaine  abuse with multiple prior tests at rehabilitation, longest sober  duration is two years.   MEDICATIONS:  Ambien CR 6.25 mg p.r.n.   SUBSTANCE HISTORY:  The patient denies tobacco abuse. Admits to cocaine  use, snorts cocaine, intermittent use for the last 26 years, and he  admits to experimentation with heroin. The patient drinks around 2-3  beers over the  weekend.   SOCIAL HISTORY:  The patient is single. Lives with his parents.   FAMILY HISTORY:  The patient's mother has atrial fibrillation, father  hypertension, atrial fibrillation.   PHYSICAL EXAMINATION:  VITAL SIGNS:  Temperature 97.4, blood pressure  146/78, respirations 20, pulse 55, O2 sats 97% on room air.  GENERAL:  The patient appeared uncomfortable and in pain.  HEENT:  Eyes:  Pinpoint pupils. ENT:  Dry nasal mucosa.  NECK:  Supple.  LUNGS:  Clear and equal bilaterally. Clear to auscultation bilaterally.  HEART:  Regular rate and rhythm. No murmurs, rubs or gallops.  ABDOMEN:  Soft, nondistended, nontender. Bowel sounds present.  EXTREMITIES:  Right knee tender to palpation with surgical scar.  NEUROLOGICAL:  Alert and oriented x4, glassy, nonfocal.   LABORATORY DATA:  On admission sodium 137, potassium  4.4, chloride 103,  bicarb 24, BUN 77, creatinine 2.2, glucose 106. Hemoglobin 12.1,  hematocrit 35.4, white count 7.3, platelets 303, CK levels of 13,122.  Acetaminophen level 14.8. Total bilirubin 0.6, alk phos 41. AST 421, ALT  229. Total protein 7.1. Albumin 3.6. Calcium 9.   HOSPITAL COURSE:  Problem #1. Acute renal insufficiency likely secondary  to rhabdomyolysis secondary to cocaine abuse. The patient was treated  with intravenous fluids which rapidly responded, and creatinine as well  as CK levels fell dramatically the following day. The patient had a good  urine output, and on day of discharge his creatinine was 1.2 back to his  baseline.  Problem #2. Rhabdomyolysis secondary to cocaine abuse. On admission the  patient's CK levels were 13,122. Like I mentioned above the patient was  treated with aggressive fluid, and CK levels were checked daily. On day  prior to discharge his CK levels were 2186. The patient did not have any  more muscle aches or pains. On a followup visit the gait as well as  basic metabolic panels for creatinine function needs to be checked one  week after discharge.  Problem #3. Transaminitis secondary to Tylenol ingestion as well as  rhabdomyolysis. On admission the patient's acetaminophen level was 14.8,  and he had reported taking 80 of Vicodin and 32 of Tylox along with 40  of Percocet. He received n-acetylcysteine during hospitalization. Poison  control was also contacted. Since patient's AST and ALT continue to  downtrend and his labs at discharge showed AST 76 with ALT 99, NAC was  stopped after a total of 24 hours. Liver function tests need to be  checked after one week as well.  Problem #4. Opiates as well as cocaine dependence. The patient was seen  by Dr. Jeanie Sewer during hospitalization and recommended to proceed with  clonidine protocol as was done in the hospital. No other psychotropics  to be added. The patient was to be admitted to [facility] in  the rehabilitation program. The patient was medically cleared and was  discharged on March 30, 2006. The patient himself wants to go to  inpatient detoxification for the same reasons.   DISCHARGE VITALS:  Temperature 97.9, blood pressure 128/76, respirations  20, pulse 51, O2 sats 97% on room air.   DISCHARGE LABORATORIES:  Sodium 140, potassium 3.4 (patient was given  KCL 40 mEq before discharge). Chloride 111, bicarb 24, glucose 93, BUN  15, creatinine 1.2. Total bilirubin 0.4, alk phos 30. AST 76, ALT 99.  Total protein 5.1. Albumin 2.5. Calcium 8. White cell count 5.9,  hemoglobin 11.21, hematocrit 31.3, MCV 91.3, platelet count 239.  Yetta Barre, M.D.  Electronically Signed      Lacretia Leigh. Ninetta Lights, M.D.  Electronically Signed    SS/MEDQ  D:  03/30/2006  T:  03/30/2006  Job:  [id number]

## 2010-12-06 LAB — POCT CARDIAC MARKERS
Myoglobin, poc: >500
Operator id: 198171
Troponin i, poc: <0.05

## 2010-12-06 LAB — COMPREHENSIVE METABOLIC PANEL
ALT: 25
ALT: 31
Alkaline Phosphatase: 35 — ABNORMAL LOW
BUN: 9
CO2: 23
CO2: 25
Chloride: 109
Chloride: 111
Creatinine, Ser: 1.24
GFR calc Af Amer: >60
GFR calc non Af Amer: >60
Glucose, Bld: 90
Potassium: 3.5
Potassium: 3.6
Sodium: 142
Total Bilirubin: 0.5
Total Bilirubin: 0.6
Total Protein: 5.4 — ABNORMAL LOW

## 2010-12-06 LAB — TSH: TSH: 1.321

## 2010-12-06 LAB — CBC
HCT: 39.7
Hemoglobin: 13.7
MCHC: 34.2
MCHC: 34.6
MCV: 87
MCV: 88
Platelets: 250
Platelets: 355
RBC: 4.56
RDW: 13.1
WBC: 5.3

## 2010-12-06 LAB — CARDIAC PANEL(CRET KIN+CKTOT+MB+TROPI): Relative Index: 0.5

## 2010-12-06 LAB — RAPID URINE DRUG SCREEN, HOSP PERFORMED
Amphetamines: NOT DETECTED
Cocaine: POSITIVE — AB
Tetrahydrocannabinol: NOT DETECTED

## 2010-12-06 LAB — HEPATIC FUNCTION PANEL
AST: 152 — ABNORMAL HIGH
Albumin: 4.3
Bilirubin, Direct: 0.3
Total Protein: 7.3

## 2010-12-06 LAB — BASIC METABOLIC PANEL
CO2: 25
Calcium: 7.9 — ABNORMAL LOW
GFR calc Af Amer: >60
GFR calc non Af Amer: >60

## 2010-12-06 LAB — URINALYSIS, ROUTINE W REFLEX MICROSCOPIC
Glucose, UA: NEGATIVE
Ketones, ur: >80 — AB
Leukocytes, UA: NEGATIVE
Nitrite: NEGATIVE
Specific Gravity, Urine: 1.03
Urobilinogen, UA: 0.2

## 2010-12-06 LAB — I-STAT 8, (EC8 V) (CONVERTED LAB)
BUN: 22
HCT: 49
Potassium: 3.9
TCO2: 23
pH, Ven: 7.432 — ABNORMAL HIGH

## 2010-12-06 LAB — CULTURE, BLOOD (ROUTINE X 2): Culture: NO GROWTH

## 2010-12-06 LAB — SODIUM, URINE, RANDOM: Sodium, Ur: <10

## 2010-12-06 LAB — MAGNESIUM: Magnesium: 2.2

## 2010-12-06 LAB — CK TOTAL AND CKMB (NOT AT ARMC)
CK, MB: 37.4 — ABNORMAL HIGH
Relative Index: 0.8

## 2010-12-06 LAB — ACETAMINOPHEN LEVEL: Acetaminophen (Tylenol), Serum: <10 — ABNORMAL LOW

## 2010-12-06 LAB — SALICYLATE LEVEL: Salicylate Lvl: <4

## 2010-12-06 LAB — URINE MICROSCOPIC-ADD ON

## 2011-05-10 ENCOUNTER — Emergency Department (HOSPITAL_BASED_OUTPATIENT_CLINIC_OR_DEPARTMENT_OTHER)
Admission: EM | Admit: 2011-05-10 | Discharge: 2011-05-10 | Disposition: A | Discharge status: Home Health | Payer: Self-pay | Attending: Emergency Medicine | Admitting: Emergency Medicine

## 2011-05-10 ENCOUNTER — Encounter (HOSPITAL_BASED_OUTPATIENT_CLINIC_OR_DEPARTMENT_OTHER): Payer: Self-pay | Admitting: Emergency Medicine

## 2011-05-10 DIAGNOSIS — M25469 Effusion, unspecified knee: Secondary | ICD-10-CM

## 2011-05-10 DIAGNOSIS — M25569 Pain in unspecified knee: Secondary | ICD-10-CM

## 2011-05-10 HISTORY — DX: Acute kidney failure, unspecified: N17.9

## 2011-05-10 HISTORY — DX: Rhabdomyolysis: M62.82

## 2011-05-10 MED ORDER — OXYCODONE-ACETAMINOPHEN 5-325 MG PO TABS: 1.0000 | ORAL_TABLET | ORAL | Status: AC | PRN

## 2011-05-10 MED ORDER — KETOROLAC TROMETHAMINE 60 MG/2ML IM SOLN
60.0000 mg | Freq: Once | INTRAMUSCULAR | Status: AC
  Administered 2011-05-10: 60 mg via INTRAMUSCULAR
  Filled 2011-05-10: qty 2

## 2011-05-10 MED ORDER — NAPROXEN 500 MG PO TABS: 500.0000 mg | ORAL_TABLET | Freq: Two times a day (BID) | ORAL | Status: DC

## 2011-05-10 NOTE — Discharge Instructions (Signed)
Please call your orthopedic doctor today for a followup within the week. Return to the emergency department for swelling, fever, redness, severe pain.  Naprosyn twice a day, keep an Ace wrap and ice packs on her knee intermittently, oxycodone for severe pain

## 2011-05-10 NOTE — ED Notes (Signed)
Pt c/o right knee pain. Pt has chronic right knee pain.

## 2011-05-10 NOTE — ED Provider Notes (Signed)
History     CSN: [id number]  Arrival date & time 05/10/11  1191   First MD Initiated Contact with Patient 05/10/11 [phone]      Chief Complaint  Patient presents with  . Knee Pain    (Consider location/radiation/quality/duration/timing/severity/associated sxs/prior treatment) HPI Comments: Patient presents to the emergency department with right knee pain. He states that he has had chronic pain in his right knee, this is associated with multiple arthroscopies of the right knee in the past. 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 This has caused increased pain and swelling in the right knee. The pain is persistent, worse with trying to strengthen the leg and bend the leg to 90. Symptoms are moderate, not associated with fevers, redness, numbness or weakness.  Patient is a 46 y.o. male presenting with knee pain. The history is provided by the patient and a friend.  Knee Pain    Past Medical History  Diagnosis Date  . Rhabdomyolysis   . Acute renal failure     Past Surgical History  Procedure Date  . Knee surgery     No family history on file.  History  Substance Use Topics  . Smoking status: Never Smoker   . Smokeless tobacco: Not on file  . Alcohol Use: No      Review of Systems  Constitutional: Negative for fever.  Musculoskeletal: Positive for joint swelling. Negative for back pain.  Skin: Negative for rash.  Neurological: Negative for weakness.    Allergies  Review of patient's allergies indicates no known allergies.  Home Medications   Current Outpatient Rx  Name Route Sig Dispense Refill  . TESTOSTERONE 50 MG/5GM TD GEL Transdermal Place 10 g onto the skin daily.    Marland Kitchen ZIPRASIDONE HCL 60 MG PO CAPS Oral Take 60 mg by mouth daily.    Marland Kitchen NAPROXEN 500 MG PO TABS Oral Take 1 tablet (500 mg total) by mouth 2 (two) times daily with a meal. 30 tablet 0  . OXYCODONE-ACETAMINOPHEN 5-325 MG PO TABS Oral Take 1  tablet by mouth every 4 (four) hours as needed for pain. May take 2 tablets PO q 6 hours for severe pain - Do not take with Tylenol as this tablet already contains tylenol 10 tablet 0    BP 138/97  Pulse 57  Temp(Src) 97.7 F (36.5 C) (Oral)  Resp 18  SpO2 98%  Physical Exam  Nursing note and vitals reviewed. Constitutional: He appears well-developed and well-nourished. No distress.  HENT:  Head: Normocephalic and atraumatic.  Eyes: Conjunctivae are normal. No scleral icterus.  Pulmonary/Chest: Effort normal.  Musculoskeletal: He exhibits tenderness ( Decreased range of motion of the right knee secondary to small joint effusion. Mild tenderness over the knee joint, range of motion of patella normal without pain. No redness of the right knee. No warmth of the right knee.). He exhibits no edema.  Neurological: He is alert. Coordination normal.       Antalgic gait secondary to right knee pain  Skin: Skin is warm and dry. No rash noted. He is not diaphoretic.    ED Course  Procedures (including critical care time)  Labs Reviewed - No data to display No results found.     1. Knee pain   2. Knee effusion       MDM  Chronic knee pain, likely inflammatory arthritis, referred back to orthopedic doctor for ongoing care and management. He has  had steroid injections and arthrocentesis for both diagnostic and therapeutic benefit in the past. We'll treat with intramuscular Toradol, home with Naprosyn twice a day and oxycodone for severe pain, 10 tablets dispensed.  Exam not consistent with septic arthritis, suspect inflammatory process, precautions given 4 return if worsening symptoms and understanding expressed.  Discharge Prescriptions include:  #1 Naprosyn #2 Percocet        Vida Roller, MD 05/10/11 0403

## 2011-05-23 ENCOUNTER — Emergency Department (HOSPITAL_COMMUNITY): Payer: Self-pay

## 2011-05-23 ENCOUNTER — Encounter (HOSPITAL_COMMUNITY): Payer: Self-pay | Admitting: Emergency Medicine

## 2011-05-23 ENCOUNTER — Emergency Department (HOSPITAL_COMMUNITY)
Admission: EM | Admit: 2011-05-23 | Discharge: 2011-05-24 | Disposition: A | Discharge status: Home / Self Care | Payer: Self-pay | Attending: Emergency Medicine | Admitting: Emergency Medicine

## 2011-05-23 DIAGNOSIS — IMO0002 Reserved for concepts with insufficient information to code with codable children: Secondary | ICD-10-CM | POA: Insufficient documentation

## 2011-05-23 DIAGNOSIS — Y9269 Other specified industrial and construction area as the place of occurrence of the external cause: Secondary | ICD-10-CM | POA: Insufficient documentation

## 2011-05-23 DIAGNOSIS — M25569 Pain in unspecified knee: Secondary | ICD-10-CM | POA: Insufficient documentation

## 2011-05-23 DIAGNOSIS — X500XXA Overexertion from strenuous movement or load, initial encounter: Secondary | ICD-10-CM | POA: Insufficient documentation

## 2011-05-23 DIAGNOSIS — S8390XA Sprain of unspecified site of unspecified knee, initial encounter: Secondary | ICD-10-CM

## 2011-05-23 NOTE — ED Notes (Signed)
PT. 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 PAIN WORSE WITH MOVEMENT.

## 2011-05-24 MED ORDER — OXYCODONE-ACETAMINOPHEN 5-325 MG PO TABS
1.0000 | ORAL_TABLET | Freq: Once | ORAL | Status: AC
  Administered 2011-05-24: 1 via ORAL
  Filled 2011-05-24: qty 1

## 2011-05-24 MED ORDER — TRAMADOL HCL 50 MG PO TABS: 50.0000 mg | ORAL_TABLET | Freq: Four times a day (QID) | ORAL | Status: AC | PRN

## 2011-05-24 MED ORDER — NAPROXEN 500 MG PO TABS: 500.0000 mg | ORAL_TABLET | Freq: Two times a day (BID) | ORAL | Status: DC

## 2011-05-24 NOTE — Discharge Instructions (Signed)
You're seen and evaluated today for your right knee pain. This time there is no concerning findings in your x-rays. There is no signs for broken bones or dislocation. You have been given crutches and a knee brace to help with your symptoms. Use rest, ice, compression and elevation to reduce swelling and pain your knee. Please followup with your primary care provider or orthopedic specialist for continued evaluation and treatment.   Knee Sprain You have a knee sprain. Sprains are painful injuries to the joints. A sprain is a partial or complete tearing of ligaments. Ligaments are tough, fibrous tissues that hold bones together at the joints. A strain (sprain) has occurred when a ligament is stretched or damaged. This injury may take several weeks to heal. This is often the same length of time as a bone fracture (break in bone) takes to heal. Even though a fracture (bone break) may not have occurred, the recovery times may be similar. HOME CARE INSTRUCTIONS   Rest the injured area for as long as directed by your caregiver. Then slowly start using the joint as directed by your caregiver and as the pain allows. Use crutches as directed. If the knee was splinted or casted, continue use and care as directed. If an ace bandage has been applied today, it should be removed and reapplied every 3 to 4 hours. It should not be applied tightly, but firmly enough to keep swelling down. Watch toes and feet for swelling, bluish discoloration, coldness, numbness or excessive pain. If any of these symptoms occur, remove the ace bandage and reapply more loosely.If these symptoms persist, seek medical attention.   For the first 24 hours, lie down. Keep the injured extremity elevated on two pillows.   Apply ice to the injured area for 15 to 20 minutes every couple hours. Repeat this 3 to 4 times per day for the first 48 hours. Put the ice in a plastic bag and place a towel between the bag of ice and your skin.   Wear any  splinting, casting, or elastic bandage applications as instructed.   Only take over-the-counter or prescription medicines for pain, discomfort, or fever as directed by your caregiver. Do not use aspirin immediately after the injury unless instructed by your caregiver. Aspirin can cause increased bleeding and bruising of the tissues.   If you were given crutches, continue to use them as instructed. Do not resume weight bearing on the affected extremity until instructed.  Persistent pain and inability to use the injured area as directed for more than 2 to 3 days are warning signs. If this happens you should see a caregiver for a follow-up visit as soon as possible. Initially, a hairline fracture (this is the same as a broken bone) may not be evident on x-rays. Persistent pain and swelling indicate that further evaluation, non-weight bearing (use of crutches as instructed), and/or further x-rays are indicated. X-rays may sometimes not show a small fracture until a week or ten days later. Make a follow-up appointment with your own caregiver or one to whom we have referred you. A radiologist (specialist in reading x-rays) may re-read your X-rays. Make sure you know how you are to get your x-ray results. Do not assume everything is normal if you do not hear from [place]. SEEK MEDICAL CARE IF:   Bruising, swelling, or pain increases.   You have cold or numb toes   You have continuing difficulty or pain with walking.  SEEK IMMEDIATE MEDICAL CARE IF:  Your toes are cold, numb or blue.   The pain is not responding to medications and continues to stay the same or get worse.  MAKE SURE YOU:   Understand these instructions.   Will watch your condition.   Will get help right away if you are not doing well or get worse.  Document Released: 02/13/2005 Document Revised: 02/02/2011 Document Reviewed: 01/28/2007 [facility] Patient Information 2012 [place], [place].   RESOURCE GUIDE  Dental Problems  Patients  with Medicaid: [facility] [id number]                                           [phone] [address] Phone:  [phone]                                                  Phone:  [phone]  If unable to pay or uninsured, contact:  [facility] or [facility]. to become qualified for the adult dental clinic.  Chronic Pain Problems Contact [facility]  [phone] Patients need to be referred by their primary care doctor.  Insufficient Money for Medicine Contact [employer] Way:  call "211" or [facility] 845-488-9778.  No Primary Care Doctor Call [facility]  [phone] Other agencies that provide inexpensive medical care    [facility]  [phone]    [facility]  [phone]    [facility]  [phone]    [facility]  [phone]    [facility]  [phone]    [facility]  [phone]  Psychological Services [facility] Behavioral Health  [phone] [facility]  [phone] [facility] Mental Health   [phone] (emergency services 337-051-6120)  Substance Abuse Resources [facility] Drug Services  (989)483-5163 [facility] [phone] [place] [phone] Floydene Flock [phone] Residential & Outpatient Substance Abuse Program  (636)133-7695  Abuse/Neglect [facility] Child Abuse Hotline 303-883-0727 [facility] Child Abuse Hotline 574-809-7563 (After Hours)  Emergency Shelter [facility] 925-484-2110  Maternity Homes Room at the [place] [facility] [place] [phone] [place] Alert Services [phone]  MRSA Hotline #:   719-227-7184    [facility] Resources  [facility] [place]     [employer]                          [facility] Dept. [address]                        [address]      [address]  Patrecia Pace  [facility] Phone:  [phone]                                   Phone:  [phone]                 Phone:  [place] Phone:  [place] [phone] (After Hours)

## 2011-05-24 NOTE — ED Provider Notes (Signed)
History     CSN: [id number]  Arrival date & time 05/23/11  2207   First MD Initiated Contact with Patient 05/24/11 0045      Chief Complaint  Patient presents with  . Knee Pain     HPI  History provided by the patient. Patient is a 46 yo male with past hx of knee surgery who reports bending down at work and having a pop to his right knee. Patient reports having history of chronic problems with knee and prior surgery in the past to same knee. Pain has continued to increasing to the area with difficulty walking.  Pt has taken Ibuprofen without significant relief.  Pt states he has had to hop due to pain while walking.  Symptoms are severe.  Pt denies any numbness or weakness in foot.  Pt denies any other injuries or complaints.     Past Medical History  Diagnosis Date  . Rhabdomyolysis   . Acute renal failure     Past Surgical History  Procedure Date  . Knee surgery     No family history on file.  History  Substance Use Topics  . Smoking status: Never Smoker   . Smokeless tobacco: Not on file  . Alcohol Use: No      Review of Systems  Respiratory: Negative for shortness of breath.   Cardiovascular: Negative for chest pain.  Musculoskeletal: Positive for joint swelling. Negative for back pain.  Skin: Negative for rash.  Neurological: Negative for light-headedness.    Allergies  Review of patient's allergies indicates no known allergies.  Home Medications   Current Outpatient Rx  Name Route Sig Dispense Refill  . TESTOSTERONE 50 MG/5GM TD GEL Transdermal Place 10 g onto the skin daily.    Marland Kitchen ZIPRASIDONE HCL 60 MG PO CAPS Oral Take 60 mg by mouth daily.      BP 122/77  Pulse 60  Temp(Src) 97 F (36.1 C) (Oral)  Resp 20  SpO2 98%  Physical Exam  Nursing note and vitals reviewed. Constitutional: He is oriented to person, place, and time. He appears well-developed and well-nourished. No distress.  HENT:  Head: Normocephalic and atraumatic.    Cardiovascular: Normal rate and regular rhythm.   Pulmonary/Chest: Effort normal and breath sounds normal.  Musculoskeletal: He exhibits no tenderness.       Mild swelling of rt knee.  Negative anterior and posterior drawer test.  No increased laxity with valgus or varus stress.  Normal distal sensations and dorsal pedal pulses.  Neurological: He is alert and oriented to person, place, and time.  Skin: Skin is warm. No erythema.  Psychiatric: He has a normal mood and affect.    ED Course  Procedures     Dg Knee Complete 4 Views Right  05/23/2011  *RADIOLOGY REPORT*  Clinical Data: Right knee popped, with right knee pain, shooting up the leg. Right knee swelling.  RIGHT KNEE - COMPLETE 4+ VIEW  Comparison: None.  Findings: There is no evidence of fracture or dislocation.  The joint spaces are grossly preserved.  Marginal osteophytes are noted arising at all three compartments, and at the tibial spine.  A small knee joint effusion is seen.  The visualized soft tissues are otherwise unremarkable in appearance.  IMPRESSION:  1.  No evidence of fracture or dislocation. 2.  Minimal tricompartmental degenerative change noted. 3.  Small knee joint effusion seen.  Original Report Authenticated By: Tonia Ghent, M.D.     1. Knee sprain  MDM  12:50 AM seen and evaluated. Patient no acute distress.        Angus Seller, [place] 05/25/11 1554

## 2011-05-29 ENCOUNTER — Ambulatory Visit: Payer: Self-pay | Admitting: Internal Medicine

## 2011-05-29 NOTE — ED Provider Notes (Signed)
Medical screening examination/treatment/procedure(s) were performed by non-physician practitioner and as supervising physician I was immediately available for consultation/collaboration.   Fouad Taul Y. Jeriko Kowalke, MD 05/29/11 1744 

## 2011-06-25 DIAGNOSIS — R51 Headache: Secondary | ICD-10-CM | POA: Insufficient documentation

## 2011-06-25 DIAGNOSIS — F19939 Other psychoactive substance use, unspecified with withdrawal, unspecified: Secondary | ICD-10-CM | POA: Insufficient documentation

## 2011-06-25 DIAGNOSIS — F1520 Other stimulant dependence, uncomplicated: Secondary | ICD-10-CM | POA: Insufficient documentation

## 2011-06-26 ENCOUNTER — Encounter (HOSPITAL_BASED_OUTPATIENT_CLINIC_OR_DEPARTMENT_OTHER): Payer: Self-pay | Admitting: *Deleted

## 2011-06-26 ENCOUNTER — Emergency Department (HOSPITAL_BASED_OUTPATIENT_CLINIC_OR_DEPARTMENT_OTHER)
Admission: EM | Admit: 2011-06-26 | Discharge: 2011-06-26 | Disposition: A | Discharge status: Home / Self Care | Payer: Self-pay | Attending: Emergency Medicine | Admitting: Emergency Medicine

## 2011-06-26 DIAGNOSIS — F13239 Sedative, hypnotic or anxiolytic dependence with withdrawal, unspecified: Secondary | ICD-10-CM

## 2011-06-26 DIAGNOSIS — R51 Headache: Secondary | ICD-10-CM

## 2011-06-26 LAB — CBC
MCV: 88.9 fL (ref 78.0–100.0)
Platelets: 271 10*3/uL (ref 150–400)
RBC: 4.5 MIL/uL (ref 4.22–5.81)
RDW: 12.1 % (ref 11.5–15.5)
WBC: 7.2 10*3/uL (ref 4.0–10.5)

## 2011-06-26 LAB — DIFFERENTIAL
Basophils Absolute: 0 10*3/uL (ref 0.0–0.1)
Eosinophils Absolute: 0.1 10*3/uL (ref 0.0–0.7)
Eosinophils Relative: 1 % (ref 0–5)
Lymphocytes Relative: 22 % (ref 12–46)
Neutrophils Relative %: 67 % (ref 43–77)

## 2011-06-26 LAB — BASIC METABOLIC PANEL
Calcium: 8.9 mg/dL (ref 8.4–10.5)
GFR calc non Af Amer: 79 mL/min — ABNORMAL LOW (ref >90)
Potassium: 3.6 mEq/L (ref 3.5–5.1)
Sodium: 138 mEq/L (ref 135–145)

## 2011-06-26 MED ORDER — DEXAMETHASONE SODIUM PHOSPHATE 4 MG/ML IJ SOLN
4.0000 mg | Freq: Once | INTRAMUSCULAR | Status: AC
  Administered 2011-06-26: 4 mg via INTRAVENOUS
  Filled 2011-06-26: qty 1

## 2011-06-26 MED ORDER — METOCLOPRAMIDE HCL 5 MG/ML IJ SOLN
10.0000 mg | Freq: Once | INTRAMUSCULAR | Status: AC
  Administered 2011-06-26: 10 mg via INTRAVENOUS
  Filled 2011-06-26: qty 2

## 2011-06-26 MED ORDER — DIPHENHYDRAMINE HCL 50 MG/ML IJ SOLN
25.0000 mg | Freq: Once | INTRAMUSCULAR | Status: AC
  Administered 2011-06-26: 25 mg via INTRAVENOUS
  Filled 2011-06-26: qty 1

## 2011-06-26 MED ORDER — KETOROLAC TROMETHAMINE 30 MG/ML IJ SOLN
30.0000 mg | Freq: Once | INTRAMUSCULAR | Status: AC
  Administered 2011-06-26: 30 mg via INTRAVENOUS
  Filled 2011-06-26: qty 1

## 2011-06-26 MED ORDER — SODIUM CHLORIDE 0.9 % IV BOLUS (SEPSIS)
1000.0000 mL | Freq: Once | INTRAVENOUS | Status: AC
  Administered 2011-06-26: 1000 mL via INTRAVENOUS

## 2011-06-26 NOTE — ED Notes (Addendum)
Pt states he has been "detoxing himself off Xanax for about a week." Now presents with headache. +nausea. Jittery. PERL Took Tylenol and Vicodin without relief.

## 2011-06-26 NOTE — ED Provider Notes (Signed)
History  This chart was scribed for Brandon Groseclose Smitty Cords, MD by Shari Heritage. The patient was seen in room MH03/MH03. Patient's care was started at 2354.  CSN: [id number]  Arrival date & time 06/25/11  2354   First MD Initiated Contact with Patient 06/26/11 0009      Chief Complaint  Patient presents with  . Headache    Patient is a 46 y.o. male presenting with headaches. The history is provided by the patient. No language interpreter was used.  Headache  This is a new problem. The current episode started yesterday. The problem occurs constantly. The problem has been gradually worsening. The headache is associated with nothing. The pain is located in the temporal region. The quality of the pain is described as sharp. The pain is moderate. The pain does not radiate. Associated symptoms include nausea. Pertinent negatives include no fever, no palpitations and no shortness of breath. He has tried acetaminophen and oral narcotic analgesics for the symptoms. The treatment provided no relief.    Brandon [name] is a 46 y.o. male who presents to the Emergency Department complaining of gradual onset, waxing and waning HA localized in the temples onset yesterday and pain has worsened since then with associated nausea. Patient reports that he was sleeping when pain began. The pain is worse with opening his eyes. Patient states that PCP wrote an RX for more of his xanax but pharmacy would no longer refill Xanax prescription, so instead of buying it off the street illegally, he decided to quit cold [place] about one week ago. He states that he was taking between 2 to 5 a day before quitting. Patient reports taking Tylenol and Vicodin with no pain relief. He denies fevers, emesis, diarrhea, and abdominal pain as associated symptoms. Patient with h/o acute renal failure. Patient is a non-smoker and denies alcohol use. No tractional symptoms.  No f/c/r.  No stiff neck.  No weakness no numbness no changes in  speech or cognition   Past Medical History  Diagnosis Date  . Rhabdomyolysis   . Acute renal failure     Past Surgical History  Procedure Date  . Knee surgery     History reviewed. No pertinent family history.  History  Substance Use Topics  . Smoking status: Never Smoker   . Smokeless tobacco: Not on file  . Alcohol Use: No      Review of Systems  Constitutional: Negative for fever, chills and fatigue.  Eyes: Negative for visual disturbance.  Respiratory: Negative for cough, shortness of breath, wheezing and stridor.   Cardiovascular: Negative for chest pain, palpitations and leg swelling.  Gastrointestinal: Positive for nausea.  Genitourinary: Negative for dysuria and hematuria.  Musculoskeletal: Negative for back pain.  Skin: Negative for wound.  Neurological: Positive for headaches.  Hematological: Negative.   Psychiatric/Behavioral: Negative for confusion.  All other systems reviewed and are negative.    Allergies  Review of patient's allergies indicates no known allergies.  Home Medications   Current Outpatient Rx  Name Route Sig Dispense Refill  . XANAX PO Oral Take by mouth.    . ZOLPIDEM TARTRATE 10 MG PO TABS Oral Take 10 mg by mouth at bedtime as needed.    Marland Kitchen NAPROXEN 500 MG PO TABS Oral Take 1 tablet (500 mg total) by mouth 2 (two) times daily. 30 tablet 0  . TESTOSTERONE 50 MG/5GM TD GEL Transdermal Place 10 g onto the skin daily.    Marland Kitchen ZIPRASIDONE HCL 60 MG PO  CAPS Oral Take 60 mg by mouth daily.      Triage Vitals: BP 121/76  Pulse 78  Temp(Src) 97.5 F (36.4 C) (Oral)  Resp 24  Ht 6' 2.5" (1.892 m)  Wt 250 lb (113.399 kg)  BMI 31.67 kg/m2  SpO2 95%  Physical Exam  Nursing note and vitals reviewed. Constitutional: He is oriented to person, place, and time. He appears well-developed and well-nourished. No distress.       Pt has pressured speech  HENT:  Head: Normocephalic and atraumatic.  Mouth/Throat: Oropharynx is clear and moist.  No oropharyngeal exudate.       TM normal bilaterally.   Eyes: Conjunctivae and EOM are normal. Pupils are equal, round, and reactive to light.  Neck: Normal range of motion. Neck supple. No tracheal deviation present.       No meningeal signs  Cardiovascular: Normal rate, regular rhythm, normal heart sounds and intact distal pulses.  Exam reveals no gallop and no friction rub.   No murmur heard. Pulmonary/Chest: Effort normal and breath sounds normal. No respiratory distress. He has no wheezes. He has no rales. He exhibits no tenderness.  Abdominal: Soft. Bowel sounds are normal. He exhibits no distension. There is no tenderness. There is no rebound and no guarding.  Musculoskeletal: Normal range of motion. He exhibits no edema and no tenderness.       5/5 strength in lower and upper extremities.  Neurological: He is alert and oriented to person, place, and time. No cranial nerve deficit.  Skin: Skin is warm. No rash noted. No erythema. No pallor.  Psychiatric: Thought content normal. His mood appears anxious. His speech is not slurred.    ED Course  Procedures (including critical care time)  DIAGNOSTIC STUDIES: Oxygen Saturation is 95% on room, normal by my interpretation.    COORDINATION OF CARE: 11:54PM-Treatment plan was discussed with patient.   Labs Reviewed  CBC  DIFFERENTIAL  BASIC METABOLIC PANEL   No results found.   No diagnosis found.    MDM  No indication for imaging at this time.  Patient does not want help with detox, has been off his medications for more than a week without symptoms.  Wants to follow up for family doctor.  Return for worsening headache, weakness numbness, stiff neck fevers or any concerns.     Jasmine Awe, MD 06/26/11 [phone]

## 2011-09-17 ENCOUNTER — Emergency Department (HOSPITAL_COMMUNITY)
Admission: EM | Admit: 2011-09-17 | Discharge: 2011-09-19 | Disposition: A | Discharge status: Rehabilitation Facility | Payer: Self-pay | Attending: Emergency Medicine | Admitting: Emergency Medicine

## 2011-09-17 DIAGNOSIS — R52 Pain, unspecified: Secondary | ICD-10-CM | POA: Insufficient documentation

## 2011-09-17 DIAGNOSIS — F1920 Other psychoactive substance dependence, uncomplicated: Secondary | ICD-10-CM | POA: Insufficient documentation

## 2011-09-17 DIAGNOSIS — F19939 Other psychoactive substance use, unspecified with withdrawal, unspecified: Secondary | ICD-10-CM | POA: Insufficient documentation

## 2011-09-18 LAB — RAPID URINE DRUG SCREEN, HOSP PERFORMED
Amphetamines: NOT DETECTED
Barbiturates: NOT DETECTED
Benzodiazepines: NOT DETECTED
Cocaine: NOT DETECTED
Opiates: NOT DETECTED
Tetrahydrocannabinol: NOT DETECTED

## 2011-09-18 LAB — BASIC METABOLIC PANEL WITH GFR
BUN: 12 mg/dL (ref 6–23)
CO2: 25 meq/L (ref 19–32)
Chloride: 96 meq/L (ref 96–112)
Creatinine, Ser: 1.1 mg/dL (ref 0.50–1.35)
Glucose, Bld: 117 mg/dL — ABNORMAL HIGH (ref 70–99)

## 2011-09-18 LAB — CBC WITH DIFFERENTIAL/PLATELET
Basophils Absolute: 0 K/uL (ref 0.0–0.1)
Basophils Relative: 1 % (ref 0–1)
Eosinophils Absolute: 0.1 10*3/uL (ref 0.0–0.7)
Eosinophils Relative: 1 % (ref 0–5)
HCT: 40.5 % (ref 39.0–52.0)
Hemoglobin: 14.1 g/dL (ref 13.0–17.0)
Lymphocytes Relative: 24 % (ref 12–46)
Lymphs Abs: 1.9 10*3/uL (ref 0.7–4.0)
MCH: 31.8 pg (ref 26.0–34.0)
MCHC: 34.8 g/dL (ref 30.0–36.0)
MCV: 91.2 fL (ref 78.0–100.0)
Monocytes Absolute: 1.1 K/uL — ABNORMAL HIGH (ref 0.1–1.0)
Monocytes Relative: 14 % — ABNORMAL HIGH (ref 3–12)
Neutro Abs: 4.8 10*3/uL (ref 1.7–7.7)
Neutrophils Relative %: 61 % (ref 43–77)
Platelets: 314 10*3/uL (ref 150–400)
RBC: 4.44 MIL/uL (ref 4.22–5.81)
RDW: 12.8 % (ref 11.5–15.5)
WBC: 7.9 10*3/uL (ref 4.0–10.5)

## 2011-09-18 LAB — BASIC METABOLIC PANEL
Calcium: 9.1 mg/dL (ref 8.4–10.5)
GFR calc Af Amer: >90 mL/min (ref >90)
GFR calc non Af Amer: 79 mL/min — ABNORMAL LOW (ref >90)
Potassium: 3.8 mEq/L (ref 3.5–5.1)
Sodium: 133 mEq/L — ABNORMAL LOW (ref 135–145)

## 2011-09-18 LAB — ETHANOL: Alcohol, Ethyl (B): <11 mg/dL (ref 0–11)

## 2011-09-18 MED ORDER — IBUPROFEN 600 MG PO TABS
600.0000 mg | ORAL_TABLET | Freq: Three times a day (TID) | ORAL | Status: DC | PRN
  Administered 2011-09-19: 600 mg via ORAL
  Filled 2011-09-18: qty 3

## 2011-09-18 MED ORDER — LORAZEPAM 1 MG PO TABS
1.0000 mg | ORAL_TABLET | Freq: Three times a day (TID) | ORAL | Status: DC | PRN
  Administered 2011-09-18 – 2011-09-19 (×4): 1 mg via ORAL
  Filled 2011-09-18 (×4): qty 1

## 2011-09-18 MED ORDER — ACETAMINOPHEN 325 MG PO TABS
650.0000 mg | ORAL_TABLET | ORAL | Status: DC | PRN
  Administered 2011-09-18: 650 mg via ORAL
  Filled 2011-09-18: qty 2

## 2011-09-18 MED ORDER — ZIPRASIDONE HCL 20 MG PO CAPS
60.0000 mg | ORAL_CAPSULE | Freq: Two times a day (BID) | ORAL | Status: DC
  Administered 2011-09-18 (×2): 60 mg via ORAL
  Filled 2011-09-18 (×2): qty 3

## 2011-09-18 MED ORDER — CLONIDINE HCL 0.1 MG PO TABS
0.1000 mg | ORAL_TABLET | Freq: Three times a day (TID) | ORAL | Status: DC | PRN
  Administered 2011-09-19: 0.1 mg via ORAL
  Filled 2011-09-18: qty 1

## 2011-09-18 MED ORDER — NICOTINE 21 MG/24HR TD PT24
21.0000 mg | MEDICATED_PATCH | Freq: Every day | TRANSDERMAL | Status: DC
  Filled 2011-09-18: qty 1

## 2011-09-18 MED FILL — Clonidine HCl Tab 0.1 MG: ORAL | Qty: 1 | Status: AC

## 2011-09-18 MED FILL — Lorazepam Tab 1 MG: ORAL | Qty: 2 | Status: AC

## 2011-09-18 NOTE — ED Provider Notes (Signed)
Ppt resting comfortably. Nad. Vitals normal. Discussed w act team.   Suzi Roots, MD 09/18/11 [phone]

## 2011-09-18 NOTE — ED Notes (Signed)
Late entry: 09/17/11 Nsg- pt arrives to TCU 26 from Triage c/o request to detox. Pt has increase anxiety , no nausea, vomiting. Very shaky, tremors. Stated took 90mg  of Oxycodone several hours ago. States that he need Geodon 120mg  to help with sleep and to calm down. A & O, cooperative. VS: 101/68, HR: 75,  SpO2: 95, RR: 25. States that he has take Geodon x 34yrs out patient to mental health clinic , [place]. Personal belonging in locker 26 x 2 bags and 1 large blue striped bag at nursing station. Pt is on monitor.  This note was typed by Guy Franco from a progress note written by Hayden Pedro, RN. Epic System down when pt was admitted/ moved to [employer].

## 2011-09-18 NOTE — ED Notes (Signed)
Patient is resting comfortably. 

## 2011-09-19 ENCOUNTER — Encounter (HOSPITAL_COMMUNITY): Payer: Self-pay | Admitting: *Deleted

## 2011-09-19 MED ORDER — METHOCARBAMOL 500 MG PO TABS
500.0000 mg | ORAL_TABLET | Freq: Three times a day (TID) | ORAL | Status: DC | PRN
  Administered 2011-09-19: 500 mg via ORAL
  Filled 2011-09-19 (×2): qty 1

## 2011-09-19 MED ORDER — HYDROXYZINE HCL 25 MG PO TABS
25.0000 mg | ORAL_TABLET | Freq: Four times a day (QID) | ORAL | Status: DC | PRN
  Administered 2011-09-19 (×2): 25 mg via ORAL
  Filled 2011-09-19 (×2): qty 1

## 2011-09-19 MED ORDER — METHOCARBAMOL 500 MG PO TABS
500.0000 mg | ORAL_TABLET | Freq: Once | ORAL | Status: AC
  Administered 2011-09-19: 500 mg via ORAL

## 2011-09-19 MED ORDER — ZIPRASIDONE HCL 20 MG PO CAPS: 120.0000 mg | ORAL_CAPSULE | Freq: Every evening | ORAL | Status: DC

## 2011-09-19 MED ORDER — DICYCLOMINE HCL 20 MG PO TABS: 20.0000 mg | ORAL_TABLET | Freq: Four times a day (QID) | ORAL | Status: DC | PRN

## 2011-09-19 MED ORDER — METHOCARBAMOL 500 MG PO TABS
1000.0000 mg | ORAL_TABLET | Freq: Three times a day (TID) | ORAL | Status: DC | PRN
  Administered 2011-09-19: 1000 mg via ORAL
  Filled 2011-09-19: qty 2

## 2011-09-19 MED ORDER — ONDANSETRON 4 MG PO TBDP
4.0000 mg | ORAL_TABLET | Freq: Four times a day (QID) | ORAL | Status: DC | PRN
  Administered 2011-09-19: 4 mg via ORAL
  Filled 2011-09-19: qty 1

## 2011-09-19 MED ORDER — LOPERAMIDE HCL 2 MG PO CAPS: 2.0000 mg | ORAL_CAPSULE | ORAL | Status: DC | PRN

## 2011-09-19 MED ORDER — NAPROXEN 500 MG PO TABS: 500.0000 mg | ORAL_TABLET | Freq: Two times a day (BID) | ORAL | Status: DC | PRN

## 2011-09-19 NOTE — ED Provider Notes (Signed)
Patient was admitted for detox of narcotics. He relates this morning he is having diffuse body pains. He was started on clonidine without the ancillary medications i.e. Robaxin, Atarax. These were written. Patient also states he takes Geodon 120 mg at 10 PM, his Geodon was written for 60 mg twice a day. He states when he takes it that way he sleeps all day. His Geodon was rewritten. Per ACT team he has been evaluated. Patient has had his paperwork sent to [facility]  By ACT and is waiting available bed.   Devoria Albe, MD, FACEP   Ward Givens, MD 09/19/11 [phone]

## 2011-09-19 NOTE — ED Notes (Signed)
Pt belongings sent with pt, discharged to self and states he has a family member driving him directly to [facility]. Pt medicated with Robaxin and Clonindine for complaints of withdrawal. Pt informed that he cannot present to [facility] with active substance use and he states that he understands he is not to use any substance between leaving here and arriving at [facility] or they will not allow him to be admitted. Pt discharged in safe and stable condition.

## 2011-09-19 NOTE — BH Assessment (Addendum)
Assessment Note   Brandon [name] Brandon [name] is a 46 y.o. male who presents to [facility] for detox.  Pt reports that he's been prescribed Oxycodone for knee surgery that he had 2 yrs ago for [place] and ACL injury.  Pt states he started abusing medication approx 1 month ago.  Pt says he now uses 60-90mg  daily. 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  Pt c/o w/d sxs: "cranky", body aches, stomach cramps, sweats, slight tremors, and diarrhea.  Pt's last use was 09/18/11.  Axis I: Opioid Dep Axis II: Deferred Axis III:  Past Medical History  Diagnosis Date  . Rhabdomyolysis   . Acute renal failure    Axis IV: other psychosocial or environmental problems, problems related to social environment and problems with primary support group Axis V: 51-60 moderate symptoms  Past Medical History:  Past Medical History  Diagnosis Date  . Rhabdomyolysis   . Acute renal failure     Past Surgical History  Procedure Date  . Knee surgery     Family History: No family history on file.  Social History:  reports that he has never smoked. He does not have any smokeless tobacco history on file. He reports that he uses illicit drugs (Oxycodone). He reports that he does not drink alcohol.  Additional Social History:  Alcohol / Drug Use Pain Medications: Oxycodone  Prescriptions: None  Over the Counter: None  History of alcohol / drug use?: Yes Substance #1 Name of Substance 1: Oxycodone  1 - Age of First Use: 73 YOM 1 - Amount (size/oz): 60-90MG S 1 - Frequency: Daily  1 - Duration: 2 Yrs  1 - Last Use / Amount: 09/18/11  CIWA: CIWA-Ar BP: 119/80 mmHg Pulse Rate: 96  COWS: Clinical Opiate Withdrawal Scale (COWS) Resting Pulse Rate: Pulse Rate 81-100 Sweating: Subjective report of chills or flushing Restlessness: Able to sit still Pupil Size: Pupils pinned or normal size for room light Bone or Joint Aches: Patient  reports sever diffuse aching of joints/muscles Runny Nose or Tearing: Not present GI Upset: No GI symptoms Tremor: Tremor can be felt, but not observed Yawning: No yawning Anxiety or Irritability: Patient reports increasing irritability or anxiousness Gooseflesh Skin: Skin is smooth COWS Total Score: 6   Allergies: No Known Allergies  Home Medications:  (Not in a hospital admission)  OB/GYN Status:  No LMP for male patient.  General Assessment Data Location of Assessment: [facility] ED Living Arrangements: Alone Can pt return to current living arrangement?: Yes Admission Status: Voluntary Is patient capable of signing voluntary admission?: Yes Transfer from: Acute Hospital Referral Source: MD  Education Status Is patient currently in school?: No Current Grade: None  Highest grade of school patient has completed: None  Name of school: None  Contact person: None   Risk to self Suicidal Ideation: No Suicidal Intent: No Is patient at risk for suicide?: Yes Suicidal Plan?: No Access to Means: No What has been your use of drugs/alcohol within the last 12 months?: Abusing Oxycodone  Previous Attempts/Gestures: No How many times?: 0  Other Self Harm Risks: None  Triggers for Past Attempts: None known Intentional Self Injurious Behavior: None Family Suicide History: No Recent stressful life event(s): Other (Comment) (Chronic ) Persecutory voices/beliefs?: No Depression: Yes Depression Symptoms: Loss of interest in usual pleasures Substance abuse history and/or treatment for substance abuse?: Yes Suicide prevention information given to non-admitted patients: Not  applicable  Risk to Others Homicidal Ideation: No Thoughts of Harm to Others: No Current Homicidal Intent: No Current Homicidal Plan: No Access to Homicidal Means: No Identified Victim: None  History of harm to others?: No Assessment of Violence: None Noted Violent Behavior Description: None  Does patient have  access to weapons?: No Criminal Charges Pending?: No Does patient have a court date: No  Psychosis Hallucinations: None noted Delusions: None noted  Mental Status Report Appear/Hygiene: Disheveled Eye Contact: Poor Motor Activity: Unremarkable Speech: Logical/coherent Level of Consciousness: Sleeping (Easily aroused ) Mood: Depressed;Irritable Affect: Anxious;Irritable Anxiety Level: None Thought Processes: Coherent;Relevant Judgement: Unimpaired Orientation: Person;Time;Place;Situation Obsessive Compulsive Thoughts/Behaviors: None  Cognitive Functioning Concentration: Normal Memory: Recent Intact;Remote Intact IQ: Average Insight: Fair Impulse Control: Fair Appetite: Fair Weight Loss: 0  Weight Gain: 0  Sleep: No Change Total Hours of Sleep: 8  Vegetative Symptoms: None  ADLScreening [facility]) Patient's cognitive ability adequate to safely complete daily activities?: Yes Patient able to express need for assistance with ADLs?: Yes Independently performs ADLs?: Yes  Abuse/Neglect [facility]) Physical Abuse: Denies Verbal Abuse: Denies Sexual Abuse: Denies  Prior Inpatient Therapy Prior Inpatient Therapy: No Prior Therapy Dates: None  Prior Therapy Facilty/Provider(s): None  Reason for Treatment: None   Prior Outpatient Therapy Prior Outpatient Therapy: No Prior Therapy Dates: None  Prior Therapy Facilty/Provider(s): None  Reason for Treatment: None   ADL Screening (condition at time of admission) Patient's cognitive ability adequate to safely complete daily activities?: Yes Patient able to express need for assistance with ADLs?: Yes Independently performs ADLs?: Yes Weakness of Legs: None Weakness of Arms/Hands: None  Home Assistive Devices/Equipment Home Assistive Devices/Equipment: None  Therapy Consults (therapy consults require a physician order) PT Evaluation Needed: No OT Evalulation Needed: No SLP Evaluation Needed:  No Abuse/Neglect Assessment (Assessment to be complete while patient is alone) Physical Abuse: Denies Verbal Abuse: Denies Sexual Abuse: Denies Exploitation of patient/patient's resources: Denies Self-Neglect: Denies Values / Beliefs Cultural Requests During Hospitalization: None Spiritual Requests During Hospitalization: None Consults Spiritual Care Consult Needed: No Social Work Consult Needed: No Merchant navy officer (For Healthcare) Advance Directive: Patient does not have advance directive;Patient would not like information Pre-existing out of facility DNR order (yellow form or pink MOST form): No Nutrition Screen Diet: Regular Unintentional weight loss greater than 10lbs within the last month: No Problems chewing or swallowing foods and/or liquids: No Home Tube Feeding or Total Parenteral Nutrition (TPN): No Patient appears severely malnourished: No  Additional Information 1:1 In Past 12 Months?: No CIRT Risk: No Elopement Risk: No Does patient have medical clearance?: Yes     Disposition:  Disposition Disposition of Patient: Inpatient treatment program;Referred to ([facility]) Type of inpatient treatment program: Adult Patient referred to: [facility]  On Site Evaluation by:   Reviewed with Physician:     Murrell Redden 09/19/2011 3:17 AM

## 2011-09-19 NOTE — ED Notes (Signed)
Pt is making phone calls and states that he has secured a ride to [facility] and that the family member will pick him up at 2100. MD notified of need for discharge

## 2011-11-24 ENCOUNTER — Emergency Department (HOSPITAL_COMMUNITY)
Admission: EM | Admit: 2011-11-24 | Discharge: 2011-11-27 | Disposition: A | Discharge status: Home / Self Care | Payer: Self-pay | Attending: Emergency Medicine | Admitting: Emergency Medicine

## 2011-11-24 DIAGNOSIS — F1110 Opioid abuse, uncomplicated: Secondary | ICD-10-CM | POA: Insufficient documentation

## 2011-11-24 DIAGNOSIS — R45851 Suicidal ideations: Secondary | ICD-10-CM | POA: Insufficient documentation

## 2011-11-24 LAB — COMPREHENSIVE METABOLIC PANEL
ALT: 15 U/L (ref 0–53)
Alkaline Phosphatase: 54 U/L (ref 39–117)
BUN: 13 mg/dL (ref 6–23)
CO2: 27 mEq/L (ref 19–32)
GFR calc Af Amer: >90 mL/min (ref >90)
GFR calc non Af Amer: >90 mL/min (ref >90)
Glucose, Bld: 95 mg/dL (ref 70–99)
Potassium: 4.1 mEq/L (ref 3.5–5.1)
Sodium: 135 mEq/L (ref 135–145)
Total Bilirubin: 0.6 mg/dL (ref 0.3–1.2)

## 2011-11-24 LAB — RAPID URINE DRUG SCREEN, HOSP PERFORMED: Amphetamines: NOT DETECTED

## 2011-11-24 LAB — CBC WITH DIFFERENTIAL/PLATELET
Eosinophils Relative: 0 % (ref 0–5)
Hemoglobin: 16.1 g/dL (ref 13.0–17.0)
Lymphocytes Relative: 14 % (ref 12–46)
Lymphs Abs: 1.2 10*3/uL (ref 0.7–4.0)
MCV: 88.7 fL (ref 78.0–100.0)
Monocytes Relative: 7 % (ref 3–12)
Neutrophils Relative %: 79 % — ABNORMAL HIGH (ref 43–77)
Platelets: 329 10*3/uL (ref 150–400)
RBC: 5.14 MIL/uL (ref 4.22–5.81)
WBC: 8.3 10*3/uL (ref 4.0–10.5)

## 2011-11-24 MED ORDER — NAPROXEN 500 MG PO TABS
500.0000 mg | ORAL_TABLET | Freq: Two times a day (BID) | ORAL | Status: DC | PRN
  Administered 2011-11-24 – 2011-11-26 (×3): 500 mg via ORAL
  Filled 2011-11-24 (×3): qty 1

## 2011-11-24 MED ORDER — ZOLPIDEM TARTRATE 10 MG PO TABS
10.0000 mg | ORAL_TABLET | Freq: Every evening | ORAL | Status: DC | PRN
  Administered 2011-11-26: 10 mg via ORAL
  Filled 2011-11-24: qty 1

## 2011-11-24 MED ORDER — ONDANSETRON 4 MG PO TBDP: 4.0000 mg | ORAL_TABLET | Freq: Four times a day (QID) | ORAL | Status: DC | PRN

## 2011-11-24 MED ORDER — CLONIDINE HCL 0.1 MG PO TABS
0.1000 mg | ORAL_TABLET | ORAL | Status: DC
  Administered 2011-11-26 – 2011-11-27 (×2): 0.1 mg via ORAL
  Filled 2011-11-24 (×2): qty 1

## 2011-11-24 MED ORDER — METHOCARBAMOL 500 MG PO TABS
500.0000 mg | ORAL_TABLET | Freq: Three times a day (TID) | ORAL | Status: DC | PRN
  Administered 2011-11-25 – 2011-11-27 (×4): 500 mg via ORAL
  Filled 2011-11-24 (×4): qty 1

## 2011-11-24 MED ORDER — CLONIDINE HCL 0.1 MG PO TABS: 0.1000 mg | ORAL_TABLET | Freq: Every day | ORAL | Status: DC

## 2011-11-24 MED ORDER — CLONIDINE HCL 0.1 MG PO TABS
0.1000 mg | ORAL_TABLET | Freq: Four times a day (QID) | ORAL | Status: AC
  Administered 2011-11-24 – 2011-11-26 (×8): 0.1 mg via ORAL
  Filled 2011-11-24 (×8): qty 1

## 2011-11-24 MED ORDER — IBUPROFEN 600 MG PO TABS: 600.0000 mg | ORAL_TABLET | Freq: Three times a day (TID) | ORAL | Status: DC | PRN

## 2011-11-24 MED ORDER — LORAZEPAM 1 MG PO TABS
1.0000 mg | ORAL_TABLET | Freq: Three times a day (TID) | ORAL | Status: DC | PRN
  Administered 2011-11-25 – 2011-11-27 (×4): 1 mg via ORAL
  Filled 2011-11-24 (×5): qty 1

## 2011-11-24 MED ORDER — DICYCLOMINE HCL 20 MG PO TABS: 20.0000 mg | ORAL_TABLET | Freq: Four times a day (QID) | ORAL | Status: DC | PRN

## 2011-11-24 MED ORDER — ZIPRASIDONE HCL 20 MG PO CAPS
60.0000 mg | ORAL_CAPSULE | Freq: Every day | ORAL | Status: DC
  Administered 2011-11-24 – 2011-11-25 (×2): 60 mg via ORAL
  Filled 2011-11-24 (×2): qty 3

## 2011-11-24 MED ORDER — LOPERAMIDE HCL 2 MG PO CAPS: 2.0000 mg | ORAL_CAPSULE | ORAL | Status: DC | PRN

## 2011-11-24 MED ORDER — HYDROXYZINE HCL 25 MG PO TABS
25.0000 mg | ORAL_TABLET | Freq: Four times a day (QID) | ORAL | Status: DC | PRN
  Administered 2011-11-25 – 2011-11-26 (×2): 25 mg via ORAL
  Filled 2011-11-24 (×2): qty 1

## 2011-11-24 NOTE — ED Notes (Signed)
Pt ran out of geodon x last 2 days.  Came here for treatment b/c of his pain instead of getting more.  Pt states he and fiance broke up last week and his father had recently had a stoke so is feeling hopelessness but denies SI/HI.

## 2011-11-24 NOTE — ED Notes (Signed)
Pt. Unable to urinate at this time. 

## 2011-11-24 NOTE — ED Notes (Signed)
Wants detox off oxycodone.  Has been through detox and has gotten hooked again.  Has also been taking approx 5000-6000mg  of tylenol a day to "counteract the withdrawal symptoms"   Last used oxycodone last night-took 180mg  total over the course of the night (30mg  tabs).  Last used 500mg  tylenol at 10am, 1130am and 1pm today.  Last drank 2, 24 oz beers last night.

## 2011-11-24 NOTE — ED Provider Notes (Signed)
History     CSN: [id number]  Arrival date & time 11/24/11  1350   First MD Initiated Contact with Patient 11/24/11 1506      Chief Complaint  Patient presents with  . Medical Clearance    (Consider location/radiation/quality/duration/timing/severity/associated sxs/prior treatment) HPI Comments: Patient comes in today requesting detox from narcotics.  He reports that he has been using approximately 250 mg of Oxycodone daily.  His fiancee recently broke up with him and he has been increasingly depressed.  He states that he has also been having suicidal thoughts, but no plan.  He denies HI at this time.  He reports occasional alcohol use.  Last drink was approximately 24 hours ago. He drank two beers last evening.  He denies drug use aside from narcotics.  He does not have a psychiatrist.  He is currently on Geodon, but has been out of this medication for the past two days.  The history is provided by the patient.    Past Medical History  Diagnosis Date  . Rhabdomyolysis   . Acute renal failure     Past Surgical History  Procedure Date  . Knee surgery     No family history on file.  History  Substance Use Topics  . Smoking status: Never Smoker   . Smokeless tobacco: Not on file  . Alcohol Use: No      Review of Systems  Constitutional: Negative for fever and chills.  Cardiovascular: Negative for chest pain.  Gastrointestinal: Positive for nausea. Negative for vomiting.  Musculoskeletal: Positive for myalgias.  Neurological: Negative for dizziness, seizures and syncope.  Psychiatric/Behavioral: Positive for suicidal ideas, disturbed wake/sleep cycle and dysphoric mood. Negative for hallucinations, confusion and self-injury. The patient is nervous/anxious.     Allergies  Review of patient's allergies indicates no known allergies.  Home Medications   Current Outpatient Rx  Name Route Sig Dispense Refill  . ZIPRASIDONE HCL 60 MG PO CAPS Oral Take 60 mg by mouth  daily.    Marland Kitchen ZOLPIDEM TARTRATE 10 MG PO TABS Oral Take 10 mg by mouth at bedtime as needed. For insomnia      BP 127/84  Pulse 84  Temp 97.7 F (36.5 C) (Oral)  Resp 18  SpO2 99%  Physical Exam  Nursing note and vitals reviewed. Constitutional: He appears well-developed and well-nourished. No distress.  HENT:  Head: Normocephalic and atraumatic.  Mouth/Throat: Oropharynx is clear and moist.  Eyes: EOM are normal. Pupils are equal, round, and reactive to light.  Neck: Normal range of motion. Neck supple.  Cardiovascular: Normal rate, regular rhythm and normal heart sounds.   Pulmonary/Chest: Effort normal and breath sounds normal.  Abdominal: Soft. There is no tenderness.  Neurological: He is alert.  Skin: Skin is warm and dry. He is not diaphoretic.  Psychiatric: His speech is normal. He is not actively hallucinating. Thought content is not paranoid and not delusional. Cognition and memory are normal. He exhibits a depressed mood. He expresses suicidal ideation. He expresses no homicidal ideation. He expresses no suicidal plans and no homicidal plans.    ED Course  Procedures (including critical care time)  Labs Reviewed  CBC WITH DIFFERENTIAL - Abnormal; Notable for the following:    Neutrophils Relative 79 (*)     All other components within normal limits  URINE RAPID DRUG SCREEN (HOSP PERFORMED)  COMPREHENSIVE METABOLIC PANEL  ETHANOL  ACETAMINOPHEN LEVEL   No results found.   No diagnosis found.  Patient discussed with Dr. Radford Pax.  Discussed with the patient the possibility of doing detox outpatient and offered to give the patient clonidine and zofran to assist with withdrawal.  Patient states that he would kill himself if discharged home and would not be safe.  MDM  Patient comes in today with suicidal thoughts and also requesting detox from oxycodone.  Psych holding orders have been placed as well as med rec orders.  Clonidine order set has been placed to assist  with withdrawal.  ACT has been consulted.        Pascal Lux Hooper, PA-C 11/25/11 0106  Magnus Sinning, PA-C 11/25/11 0110

## 2011-11-25 MED ORDER — ZIPRASIDONE HCL 20 MG PO CAPS
60.0000 mg | ORAL_CAPSULE | Freq: Two times a day (BID) | ORAL | Status: DC
  Administered 2011-11-25 – 2011-11-26 (×3): 60 mg via ORAL
  Filled 2011-11-25 (×3): qty 3

## 2011-11-25 NOTE — ED Notes (Signed)
CSW met with pt by bedside to provide update on referral status. CSW informed pt that he has been declined at [facility]. Currently [facility] has no beds available at this time. Pt informed CSW that he still desires detox at this time. Pt reported that he is experiencing withdrawal symptoms such as tactile disturbances, nausea, and headaches. CSW will notify RN and MD.

## 2011-11-25 NOTE — ED Provider Notes (Addendum)
Pt resting, nad. Vitals normal. Discussed w act team - working on possible rehab/detox placement.   Suzi Roots, MD 11/25/11 0741  Pt resting, nad. Have paged act to discuss disposition.   Suzi Roots, MD 11/26/11 [phone]

## 2011-11-25 NOTE — ED Provider Notes (Signed)
Medical screening examination/treatment/procedure(s) were performed by non-physician practitioner and as supervising physician I was immediately available for consultation/collaboration.    Nthony Lefferts L Sherman Donaldson, MD 11/25/11 0744 

## 2011-11-25 NOTE — BH Assessment (Signed)
Assessment Note   Brandon [name] Brandon [name] is an 46 y.o. male who presents voluntarily requesting detox from oxycodone. Pt states he uses 200mg  daily and has for the pas 2 weeks. He reports using heavily for the past 2 years. He states he had knee surgery 5 years ago and was prescribed pain medication. He states he quickly became addicted and started abusing his prescription. He reports seeking treatment at this time because he is "tired" and "doesn't want to do this any more." He reports he went through detox earlier this summer and was doing well until his fiance cheated on him and they broke up. He reports he has felt depressed since that time and started using again. Current withdrawal symptoms include anxiety, body aches, restlessness, and nausea. He denies a history of seizures.  He denies SI and [facility]. He endorses passive HI, stating "I'm not going to lie I've thought about hurting the guy (who cheated with his girlfriend) but I wouldn't do it. I would throw a party if something ever happened to him but I'm not going to hurt him." He has a past history of depression. He has no current outpatient providers.      Axis I: Depressive Disorder NOS and 304.00  Opioid Dependence  Axis II: Deferred Axis III:  Past Medical History  Diagnosis Date  . Rhabdomyolysis   . Acute renal failure    Axis IV: problems related to social environment and problems with primary support group Axis V: 51-60 moderate symptoms  Past Medical History:  Past Medical History  Diagnosis Date  . Rhabdomyolysis   . Acute renal failure     Past Surgical History  Procedure Date  . Knee surgery     Family History: No family history on file.  Social History:  reports that he has never smoked. He does not have any smokeless tobacco history on file. He reports that he uses illicit drugs (Oxycodone). He reports that he does not drink alcohol.  Additional Social History:  Alcohol / Drug Use History of alcohol / drug use?:  Yes Substance #1 Name of Substance 1: Oxycodone 1 - Age of First Use: 40 1 - Amount (size/oz): 200mg   1 - Frequency: daily 1 - Duration: daily for 2 weeks, heavily on and off for 5 years 1 - Last Use / Amount: 11/24/11 Substance #2 Name of Substance 2: alcohol 2 - Age of First Use: 20s 2 - Amount (size/oz): varries 2 - Frequency: varries  CIWA: CIWA-Ar BP: 133/75 mmHg Pulse Rate: 76  COWS:    Allergies: No Known Allergies  Home Medications:  (Not in a hospital admission)  OB/GYN Status:  No LMP for male patient.  General Assessment Data Location of Assessment: [facility] Living Arrangements: Alone Can pt return to current living arrangement?: Yes Admission Status: Voluntary Is patient capable of signing voluntary admission?: Yes Transfer from: Acute Hospital Referral Source: MD  Education Status Is patient currently in school?: No  Risk to self Suicidal Ideation: No-Not Currently/Within Last 6 Months Suicidal Intent: No Is patient at risk for suicide?: No Suicidal Plan?: No Access to Means: No What has been your use of drugs/alcohol within the last 12 months?: oxycodone Previous Attempts/Gestures: Yes How many times?: 1  Other Self Harm Risks: none Triggers for Past Attempts: Other personal contacts Intentional Self Injurious Behavior: None Family Suicide History: No Recent stressful life event(s): Conflict (Comment) (break up with fiance) Persecutory voices/beliefs?: No Depression: Yes Depression Symptoms: Despondent;Loss of interest in usual  pleasures;Feeling worthless/self pity;Fatigue Substance abuse history and/or treatment for substance abuse?: Yes Suicide prevention information given to non-admitted patients: Not applicable  Risk to Others Homicidal Ideation: Yes-Currently Present Thoughts of Harm to Others: No Current Homicidal Intent: No Current Homicidal Plan: No Access to Homicidal Means: No Identified Victim:  (man who cheated with his  fiance) History of harm to others?: No Assessment of Violence: None Noted Violent Behavior Description: cooperative Does patient have access to weapons?: No Criminal Charges Pending?: No Does patient have a court date: No  Psychosis Hallucinations: None noted Delusions: None noted  Mental Status Report Appear/Hygiene: Disheveled Eye Contact: Fair Motor Activity: Unremarkable Speech: Logical/coherent Level of Consciousness: Alert Mood: Depressed;Anxious Affect: Depressed;Anxious Anxiety Level: Moderate Thought Processes: Coherent;Relevant Judgement: Unimpaired Orientation: Person;Place;Time;Situation Obsessive Compulsive Thoughts/Behaviors: None  Cognitive Functioning Concentration: Normal Memory: Remote Intact;Recent Intact IQ: Average Insight: Fair Impulse Control: Poor Appetite: Fair Weight Loss: 0  Weight Gain: 0  Sleep: Increased Vegetative Symptoms: Staying in bed  ADLScreening [facility] Assessment Services) Patient's cognitive ability adequate to safely complete daily activities?: Yes Patient able to express need for assistance with ADLs?: Yes Independently performs ADLs?: Yes (appropriate for developmental age)  Abuse/Neglect [facility]) Physical Abuse: Denies Verbal Abuse: Denies Sexual Abuse: Denies  Prior Inpatient Therapy Prior Inpatient Therapy: Yes Prior Therapy Dates: 2013 Prior Therapy Facilty/Provider(s): [facility] Reason for Treatment: detox  Prior Outpatient Therapy Prior Outpatient Therapy: No Prior Therapy Dates: na Prior Therapy Facilty/Provider(s): na Reason for Treatment: na  ADL Screening (condition at time of admission) Patient's cognitive ability adequate to safely complete daily activities?: Yes Patient able to express need for assistance with ADLs?: Yes Independently performs ADLs?: Yes (appropriate for developmental age) Weakness of Legs: None Weakness of Arms/Hands: None  Home Assistive Devices/Equipment Home Assistive  Devices/Equipment: None    Abuse/Neglect Assessment (Assessment to be complete while patient is alone) Physical Abuse: Denies Verbal Abuse: Denies Sexual Abuse: Denies Exploitation of patient/patient's resources: Denies Self-Neglect: Denies     Merchant navy officer (For Healthcare) Advance Directive: Patient does not have advance directive Pre-existing out of facility DNR order (yellow form or pink MOST form): No Nutrition Screen- [facility] Patient's home diet: Regular Have you recently lost weight without trying?: No Have you been eating poorly because of a decreased appetite?: No Malnutrition Screening Tool Score: 0   Additional Information 1:1 In Past 12 Months?: No CIRT Risk: No Elopement Risk: No Does patient have medical clearance?: Yes     Disposition:  Disposition Disposition of Patient: Referred to;Inpatient treatment program Type of inpatient treatment program: Adult Patient referred to: [facility]  On Site Evaluation by:   Reviewed with Physician:     Marjean Donna 11/25/2011 12:53 AM

## 2011-11-25 NOTE — ED Notes (Signed)
Snack given.

## 2011-11-25 NOTE — ED Notes (Signed)
Greg into see 

## 2011-11-25 NOTE — BH Assessment (Signed)
Assessment Note   Brandon [name] is an 46 y.o. male. Per initial assessment: who presents voluntarily requesting detox from oxycodone. Pt states he uses 200mg  daily and has for the pas 2 weeks. He reports using heavily for the past 2 years. He states he had knee surgery 5 years ago and was prescribed pain medication. He states he quickly became addicted and started abusing his prescription. He reports seeking treatment at this time because he is "tired" and "doesn't want to do this any more." He reports he went through detox earlier this summer and was doing well until his fiance cheated on him and they broke up. He reports he has felt depressed since that time and started using again. Current withdrawal symptoms include anxiety, body aches, restlessness, and nausea. He denies a history of seizures.  He denies SI and [facility]. He endorses passive HI, stating "I'm not going to lie I've thought about hurting the guy (who cheated with his girlfriend) but I wouldn't do it. I would throw a party if something ever happened to him but I'm not going to hurt him." He has a past history of depression. He has no current outpatient providers.   During reassessment pt continues to verbalize withdrawal symptoms such as headache, stomach pain, and tactile disturbances. Patient verbalized to Clinical research associate that he desires to receive detox at this time. CSW explained to pt that he has been declined at [facility] and [facility] for detox and that [facility] is currently full today. CSW/ACT will continue to follow pt and assist with placement.   Axis I: Alcohol Abuse, Mood Disorder NOS and 304.00 Opioid Dependence Axis II: No diagnosis Axis III:  Past Medical History  Diagnosis Date  . Rhabdomyolysis   . Acute renal failure    Axis IV: problems related to social environment and problems with primary support group Axis V: 41-50 serious symptoms  Past Medical History:  Past Medical History  Diagnosis Date  . Rhabdomyolysis   . Acute renal  failure     Past Surgical History  Procedure Date  . Knee surgery     Family History: No family history on file.  Social History:  reports that he has never smoked. He does not have any smokeless tobacco history on file. He reports that he uses illicit drugs (Oxycodone). He reports that he does not drink alcohol.  Additional Social History:  Alcohol / Drug Use Pain Medications: See MAR Prescriptions: See MAR Over the Counter: See MAR History of alcohol / drug use?: Yes Substance #1 Name of Substance 1: Oxycodone 1 - Age of First Use: 40 1 - Amount (size/oz): 200mg  1 - Frequency: daily 1 - Duration: daily for past 2 wks, heavy drinker for past 5 years 1 - Last Use / Amount: 11/24/11 Substance #2 Name of Substance 2: Alcohol 2 - Age of First Use: 20s 2 - Amount (size/oz): varies 2 - Frequency: varies 2 - Duration: unknown 2 - Last Use / Amount: unknown  CIWA: CIWA-Ar BP: 111/70 mmHg Pulse Rate: 76  COWS: Clinical Opiate Withdrawal Scale (COWS) Resting Pulse Rate: Pulse Rate 80 or below Sweating: Subjective report of chills or flushing Restlessness: Frequent shifting or extraneous movements of legs/arms Pupil Size: Pupils pinned or normal size for room light Bone or Joint Aches: Patient reports sever diffuse aching of joints/muscles Runny Nose or Tearing: Not present GI Upset: nausea or loose stool Tremor: Slight tremor observable Yawning: No yawning Anxiety or Irritability: Patient obviously irritable/anxious Gooseflesh Skin: Skin is  smooth COWS Total Score: 12   Allergies: No Known Allergies  Home Medications:  (Not in a hospital admission)  OB/GYN Status:  No LMP for male patient.  General Assessment Data [facility]: [facility] Living Arrangements: Alone Can pt return to current living arrangement?: Yes Admission Status: Voluntary Is patient capable of signing voluntary admission?: Yes Transfer from: Acute Hospital Referral Source: MD  Education  Status Is patient currently in school?: No  Risk to self Suicidal Ideation: No-Not Currently/Within Last 6 Months Suicidal Intent: No Is patient at risk for suicide?: No Suicidal Plan?: No Access to Means: No What has been your use of drugs/alcohol within the last 12 months?: Oxycodone Previous Attempts/Gestures: Yes How many times?: 1  Other Self Harm Risks: None Triggers for Past Attempts: Other (Comment) (Relational stressors) Intentional Self Injurious Behavior: None Family Suicide History: No Recent stressful life event(s): Conflict (Comment) (Separation from fiance) Persecutory voices/beliefs?: No Depression: Yes Depression Symptoms: Despondent;Loss of interest in usual pleasures;Feeling worthless/self pity;Fatigue Substance abuse history and/or treatment for substance abuse?: Yes Suicide prevention information given to non-admitted patients: Not applicable  Risk to Others Homicidal Ideation: Yes-Currently Present Thoughts of Harm to Others: No-Not Currently Present/Within Last 6 Months Current Homicidal Intent: No Current Homicidal Plan: No Access to Homicidal Means: No Identified Victim: Unidentified male  History of harm to others?: No Assessment of Violence: None Noted Violent Behavior Description: Calm and cooperative Does patient have access to weapons?: No Criminal Charges Pending?: No Does patient have a court date: No  Psychosis Hallucinations: None noted Delusions: None noted  Mental Status Report Appear/Hygiene: Disheveled Eye Contact: Poor Motor Activity: Freedom of movement Speech: Logical/coherent Level of Consciousness: Quiet/awake;Sleeping Mood: Depressed Affect: Appropriate to circumstance;Depressed Anxiety Level: Minimal Thought Processes: Coherent;Relevant Judgement: Unimpaired Orientation: Person;Place;Time;Situation Obsessive Compulsive Thoughts/Behaviors: None  Cognitive Functioning Concentration: Normal Memory: Recent Intact;Remote  Intact IQ: Average Insight: Fair Impulse Control: Poor Appetite: Fair Weight Loss: 0  Weight Gain: 0  Sleep: Increased Total Hours of Sleep:  (unk) Vegetative Symptoms: Staying in bed  ADLScreening [facility] Assessment Services) Patient's cognitive ability adequate to safely complete daily activities?: Yes Patient able to express need for assistance with ADLs?: Yes Independently performs ADLs?: Yes (appropriate for developmental age)  Abuse/Neglect [facility]) Physical Abuse: Denies Verbal Abuse: Denies Sexual Abuse: Denies  Prior Inpatient Therapy Prior Inpatient Therapy: Yes Prior Therapy Dates: 2013 Prior Therapy Facilty/Provider(s): [facility] Reason for Treatment: detox  Prior Outpatient Therapy Prior Outpatient Therapy: No Prior Therapy Dates: N/A Prior Therapy Facilty/Provider(s): N/A Reason for Treatment: N/A  ADL Screening (condition at time of admission) Patient's cognitive ability adequate to safely complete daily activities?: Yes Patient able to express need for assistance with ADLs?: Yes Independently performs ADLs?: Yes (appropriate for developmental age) Weakness of Legs: None Weakness of Arms/Hands: None  Home Assistive Devices/Equipment Home Assistive Devices/Equipment: None  Therapy Consults (therapy consults require a physician order) PT Evaluation Needed: No OT Evalulation Needed: No SLP Evaluation Needed: No Abuse/Neglect Assessment (Assessment to be complete while patient is alone) Physical Abuse: Denies Verbal Abuse: Denies Sexual Abuse: Denies Exploitation of patient/patient's resources: Denies Self-Neglect: Denies Values / Beliefs Cultural Requests During Hospitalization: None Spiritual Requests During Hospitalization: None Consults Spiritual Care Consult Needed: No Social Work Consult Needed: No Merchant navy officer (For Healthcare) Advance Directive: Patient does not have advance directive Pre-existing out of facility DNR order (yellow form or pink  MOST form): No Nutrition Screen- [facility] Patient's home diet: Regular Have you recently lost weight without trying?: No  Have you been eating poorly because of a decreased appetite?: No Malnutrition Screening Tool Score: 0   Additional Information 1:1 In Past 12 Months?: No CIRT Risk: No Elopement Risk: No Does patient have medical clearance?: Yes     Disposition: Currently no detox beds available but may have openings tomorrow. Disposition Disposition of Patient: Inpatient treatment program Type of inpatient treatment program: Adult Patient referred to: [facility]  On Site Evaluation by:  Self Reviewed with Physician:     Haskel Khan 11/25/2011 2:53 PM

## 2011-11-26 NOTE — ED Notes (Signed)
NAD, resting quietly, watching tv

## 2011-11-26 NOTE — ED Notes (Signed)
CSW spoke with Vernona Rieger from [facility] who reported that there are no beds today; however, the  facility is expecting a number of discharges tomorrow. Vernona Rieger requested that CSW/ACT  follow up tomorrow morning for potential placement.  Janann Colonel., MSW, [facility] Clinical Social Worker [phone]

## 2011-11-26 NOTE — ED Notes (Signed)
Up to the bathroom 

## 2011-11-27 MED ORDER — HYDROXYZINE HCL 25 MG PO TABS: 25.0000 mg | ORAL_TABLET | Freq: Four times a day (QID) | ORAL | Status: DC | PRN

## 2011-11-27 MED ORDER — CLONIDINE HCL 0.1 MG PO TABS: 0.1000 mg | ORAL_TABLET | Freq: Three times a day (TID) | ORAL | Status: DC | PRN

## 2011-11-27 MED ORDER — LORAZEPAM 1 MG PO TABS: 1.0000 mg | ORAL_TABLET | Freq: Four times a day (QID) | ORAL | Status: DC | PRN

## 2011-11-27 NOTE — ED Provider Notes (Signed)
Filed Vitals:   11/27/11 0603  BP:   Pulse: 53  Temp: 97.4 F (36.3 C)  Resp: 15   Pt seen and assessed. Would like to go home. Says doesn't want to go todetox. Says feels like getting over worst of withdrawal symptoms and just want prescriptions for a few more days to continue to help with symptoms. Here voluntarily. Will DC with outpt resources.  Raeford Razor, MD 11/27/11 [phone]

## 2011-11-27 NOTE — ED Notes (Signed)
Patient resting in bed. Door open no complaints.

## 2011-12-05 ENCOUNTER — Encounter (HOSPITAL_BASED_OUTPATIENT_CLINIC_OR_DEPARTMENT_OTHER): Payer: Self-pay | Admitting: *Deleted

## 2011-12-05 ENCOUNTER — Emergency Department (HOSPITAL_BASED_OUTPATIENT_CLINIC_OR_DEPARTMENT_OTHER)
Admission: EM | Admit: 2011-12-05 | Discharge: 2011-12-05 | Disposition: A | Discharge status: Home / Self Care | Payer: Self-pay | Attending: Emergency Medicine | Admitting: Emergency Medicine

## 2011-12-05 DIAGNOSIS — Z760 Encounter for issue of repeat prescription: Secondary | ICD-10-CM | POA: Insufficient documentation

## 2011-12-05 DIAGNOSIS — F1921 Other psychoactive substance dependence, in remission: Secondary | ICD-10-CM

## 2011-12-05 DIAGNOSIS — N179 Acute kidney failure, unspecified: Secondary | ICD-10-CM | POA: Insufficient documentation

## 2011-12-05 DIAGNOSIS — M25519 Pain in unspecified shoulder: Secondary | ICD-10-CM | POA: Insufficient documentation

## 2011-12-05 MED ORDER — CLONIDINE HCL 0.2 MG PO TABS: 0.1000 mg | ORAL_TABLET | Freq: Two times a day (BID) | ORAL | Status: DC

## 2011-12-05 NOTE — ED Provider Notes (Signed)
History     CSN: [id number]  Arrival date & time 12/05/11  1023   First MD Initiated Contact with Patient 12/05/11 1118      Chief Complaint  Patient presents with  . Medication Refill    (Consider location/radiation/quality/duration/timing/severity/associated sxs/prior treatment) HPI  1- shoulder injury- Patient fell and caught arm over ladder stretching shoulder and complains of pain in right shoulder but did not have any direct blow to shoulder and did not fall on it.  This occurred yesterday.  He has no numbness or tingling or weakness.   2- Patient states he was [facility] for detox but is almost out of detox meds and was told to come here for refill.  Patient taking po well without vomiting, diarrhea, or shakes.  He has been taking ativan and clonidine.    Past Medical History  Diagnosis Date  . Rhabdomyolysis   . Acute renal failure     Past Surgical History  Procedure Date  . Knee surgery     History reviewed. No pertinent family history.  History  Substance Use Topics  . Smoking status: Never Smoker   . Smokeless tobacco: Not on file  . Alcohol Use: No      Review of Systems  Constitutional: Negative for fever and chills.  HENT: Negative for neck stiffness.   Eyes: Negative for visual disturbance.  Respiratory: Negative for shortness of breath.   Cardiovascular: Negative for chest pain.  Gastrointestinal: Negative for vomiting, diarrhea and blood in stool.  Genitourinary: Negative for dysuria, frequency and decreased urine volume.  Musculoskeletal: Negative for myalgias and joint swelling.  Skin: Negative for rash.  Neurological: Negative for weakness.  Hematological: Negative for adenopathy.  Psychiatric/Behavioral: Negative for agitation.    Allergies  Review of patient's allergies indicates no known allergies.  Home Medications   Current Outpatient Rx  Name Route Sig Dispense Refill  . ALPRAZOLAM 1 MG PO TABS Oral Take 1 mg by mouth at bedtime as  needed. For sleep.    Marland Kitchen CLONIDINE HCL 0.1 MG PO TABS Oral Take 1 tablet (0.1 mg total) by mouth 3 (three) times daily as needed (withdrawal symptoms). 9 tablet 0  . HYDROXYZINE HCL 25 MG PO TABS Oral Take 1 tablet (25 mg total) by mouth every 6 (six) hours as needed for anxiety. 12 tablet 0  . LORAZEPAM 1 MG PO TABS Oral Take 1 tablet (1 mg total) by mouth every 6 (six) hours as needed for anxiety (agitation). 12 tablet 0  . OXYCODONE HCL 10 MG PO TABS Oral Take 10 mg by mouth daily as needed. For breakthrough pain.    Marland Kitchen ZIPRASIDONE HCL 60 MG PO CAPS Oral Take 60 mg by mouth daily.    Marland Kitchen ZOLPIDEM TARTRATE 10 MG PO TABS Oral Take 10 mg by mouth at bedtime as needed. For insomnia      BP 130/101  Pulse 88  Temp 97.6 F (36.4 C) (Oral)  Resp 20  SpO2 97%  Physical Exam  Nursing note and vitals reviewed. Constitutional: He is oriented to person, place, and time. He appears well-developed and well-nourished.  HENT:  Head: Normocephalic and atraumatic.  Eyes: Conjunctivae normal and EOM are normal. Pupils are equal, round, and reactive to light.  Neck: Normal range of motion. Neck supple.  Cardiovascular: Normal rate and regular rhythm.   Pulmonary/Chest: Effort normal and breath sounds normal.  Abdominal: Soft. Bowel sounds are normal.  Musculoskeletal: Normal range of motion.  Right shoulder with full arom, no trauma seen or deformity noted.   Neurological: He is alert and oriented to person, place, and time.  Skin: Skin is warm and dry.  Psychiatric: He has a normal mood and affect. His behavior is normal.    ED Course  Procedures (including critical care time)  Labs Reviewed - No data to display No results found.   No diagnosis found.    MDM     Patient given rx for clonidine    Hilario Quarry, MD 12/05/11 [phone]

## 2011-12-05 NOTE — ED Notes (Signed)
Pt amb to room 12 with quick steady gait in nad. Pt requests refills for his "detox medications". Pt states he was seen at [facility] and rx medications to help him detox from opiates. He is now out of these medications. Pt also states that he injured his shoulder at work yesterday and it feels sore.

## 2011-12-06 ENCOUNTER — Emergency Department (HOSPITAL_BASED_OUTPATIENT_CLINIC_OR_DEPARTMENT_OTHER): Payer: Self-pay

## 2011-12-06 ENCOUNTER — Emergency Department (HOSPITAL_BASED_OUTPATIENT_CLINIC_OR_DEPARTMENT_OTHER)
Admission: EM | Admit: 2011-12-06 | Discharge: 2011-12-06 | Disposition: A | Discharge status: Home / Self Care | Payer: Self-pay | Attending: Emergency Medicine | Admitting: Emergency Medicine

## 2011-12-06 ENCOUNTER — Encounter (HOSPITAL_BASED_OUTPATIENT_CLINIC_OR_DEPARTMENT_OTHER): Payer: Self-pay | Admitting: Emergency Medicine

## 2011-12-06 DIAGNOSIS — W010XXA Fall on same level from slipping, tripping and stumbling without subsequent striking against object, initial encounter: Secondary | ICD-10-CM | POA: Insufficient documentation

## 2011-12-06 DIAGNOSIS — F1120 Opioid dependence, uncomplicated: Secondary | ICD-10-CM | POA: Insufficient documentation

## 2011-12-06 DIAGNOSIS — W19XXXA Unspecified fall, initial encounter: Secondary | ICD-10-CM

## 2011-12-06 DIAGNOSIS — S20219A Contusion of unspecified front wall of thorax, initial encounter: Secondary | ICD-10-CM | POA: Insufficient documentation

## 2011-12-06 HISTORY — DX: Opioid abuse, uncomplicated: F11.10

## 2011-12-06 MED ORDER — KETOROLAC TROMETHAMINE 60 MG/2ML IM SOLN
INTRAMUSCULAR | Status: AC
  Administered 2011-12-06: 60 mg via INTRAMUSCULAR
  Filled 2011-12-06: qty 2

## 2011-12-06 MED ORDER — KETOROLAC TROMETHAMINE 60 MG/2ML IM SOLN
60.0000 mg | Freq: Once | INTRAMUSCULAR | Status: AC
  Administered 2011-12-06: 60 mg via INTRAMUSCULAR

## 2011-12-06 NOTE — ED Notes (Signed)
D/c home with ride- no rx given 

## 2011-12-06 NOTE — ED Notes (Signed)
Pt states he fell on a table. C/o right sided rib pain

## 2011-12-06 NOTE — ED Notes (Signed)
Delo MD at bedside. 

## 2011-12-06 NOTE — ED Provider Notes (Signed)
History     CSN: [id number]  Arrival date & time 12/06/11  0320   First MD Initiated Contact with Patient 12/06/11 [phone]      Chief Complaint  Patient presents with  . Fall  . Rib Injury    (Consider location/radiation/quality/duration/timing/severity/associated sxs/prior treatment) HPI Comments: Patient states "I tripped and I hurt myself".  He tells me he tripped and fell, injuring his chest.  He is complaining of severe pain in the right ribs and chest.  "Just make the pain go away".  He has a history of oxycontin addiction.  Was recently at [facility] requesting to go to rehab, then changed his mind and left.  He presented here yesterday for a refill of clonidine for his withdrawal symptoms.    Patient is a 46 y.o. male presenting with fall. The history is provided by the patient.  Fall The accident occurred 1 to 2 hours ago. The fall occurred while walking. He fell from an unknown height. He landed on a hard floor. There was no blood loss. Point of impact: right chest. Pain location: right chest. The pain is severe. He was ambulatory at the scene. The symptoms are aggravated by activity, standing and pressure on the injury.    Past Medical History  Diagnosis Date  . Rhabdomyolysis   . Acute renal failure   . Opiate abuse, continuous     Past Surgical History  Procedure Date  . Knee surgery     No family history on file.  History  Substance Use Topics  . Smoking status: Never Smoker   . Smokeless tobacco: Not on file  . Alcohol Use: No      Review of Systems  All other systems reviewed and are negative.    Allergies  Review of patient's allergies indicates no known allergies.  Home Medications   Current Outpatient Rx  Name Route Sig Dispense Refill  . ALPRAZOLAM 1 MG PO TABS Oral Take 1 mg by mouth at bedtime as needed. For sleep.    Marland Kitchen CLONIDINE HCL 0.1 MG PO TABS Oral Take 1 tablet (0.1 mg total) by mouth 3 (three) times daily as needed (withdrawal  symptoms). 9 tablet 0  . CLONIDINE HCL 0.2 MG PO TABS Oral Take 0.5 tablets (0.1 mg total) by mouth 2 (two) times daily. 10 tablet 0  . HYDROXYZINE HCL 25 MG PO TABS Oral Take 1 tablet (25 mg total) by mouth every 6 (six) hours as needed for anxiety. 12 tablet 0  . LORAZEPAM 1 MG PO TABS Oral Take 1 tablet (1 mg total) by mouth every 6 (six) hours as needed for anxiety (agitation). 12 tablet 0  . OXYCODONE HCL 10 MG PO TABS Oral Take 10 mg by mouth daily as needed. For breakthrough pain.    Marland Kitchen ZIPRASIDONE HCL 60 MG PO CAPS Oral Take 60 mg by mouth daily.    Marland Kitchen ZOLPIDEM TARTRATE 10 MG PO TABS Oral Take 10 mg by mouth at bedtime as needed. For insomnia      BP 137/102  Pulse 98  Temp 97.2 F (36.2 C) (Oral)  Resp 20  SpO2 98%  Physical Exam  Nursing note and vitals reviewed. Constitutional: He is oriented to person, place, and time. He appears well-developed and well-nourished.       He appears anxious.Marland Kitchen  HENT:  Head: Normocephalic and atraumatic.  Neck: Normal range of motion. Neck supple.  Cardiovascular: Normal rate and regular rhythm.   No murmur heard. Pulmonary/Chest: Effort  normal and breath sounds normal. No respiratory distress.  Abdominal: Soft. Bowel sounds are normal. He exhibits no distension. There is no tenderness.  Musculoskeletal: Normal range of motion. He exhibits no edema.       There is a small ecchymosis to the right anterior chest just below the right nipple.  He has ttp to the right chest wall.    Neurological: He is alert and oriented to person, place, and time.  Skin: Skin is warm and dry.    ED Course  Procedures (including critical care time)  Labs Reviewed - No data to display No results found.   No diagnosis found.    MDM  The patient is feeling better with toradol and the xrays do not reveal any fractures.  He will be discharged to home with nsaids, rest, and time.  To return prn if he worsens.        Geoffery Lyons, MD 12/06/11 [phone]

## 2011-12-06 NOTE — ED Notes (Signed)
MD at bedside. 

## 2012-03-12 ENCOUNTER — Inpatient Hospital Stay (HOSPITAL_COMMUNITY)
Admission: EM | Admit: 2012-03-12 | Discharge: 2012-03-16 | DRG: 918 | Disposition: A | Discharge status: Other Acute Inpatient Hospital | Payer: Self-pay | Attending: Internal Medicine | Admitting: Internal Medicine

## 2012-03-12 ENCOUNTER — Encounter (HOSPITAL_COMMUNITY): Payer: Self-pay

## 2012-03-12 DIAGNOSIS — R34 Anuria and oliguria: Secondary | ICD-10-CM

## 2012-03-12 DIAGNOSIS — D649 Anemia, unspecified: Secondary | ICD-10-CM

## 2012-03-12 DIAGNOSIS — T43624A Poisoning by amphetamines, undetermined, initial encounter: Secondary | ICD-10-CM | POA: Diagnosis present

## 2012-03-12 DIAGNOSIS — F39 Unspecified mood [affective] disorder: Secondary | ICD-10-CM

## 2012-03-12 DIAGNOSIS — R45850 Homicidal ideations: Secondary | ICD-10-CM

## 2012-03-12 DIAGNOSIS — R45851 Suicidal ideations: Secondary | ICD-10-CM

## 2012-03-12 DIAGNOSIS — F19921 Other psychoactive substance use, unspecified with intoxication with delirium: Secondary | ICD-10-CM | POA: Diagnosis present

## 2012-03-12 DIAGNOSIS — R4182 Altered mental status, unspecified: Secondary | ICD-10-CM

## 2012-03-12 DIAGNOSIS — E860 Dehydration: Secondary | ICD-10-CM | POA: Diagnosis present

## 2012-03-12 DIAGNOSIS — T405X1A Poisoning by cocaine, accidental (unintentional), initial encounter: Principal | ICD-10-CM | POA: Diagnosis present

## 2012-03-12 DIAGNOSIS — F411 Generalized anxiety disorder: Secondary | ICD-10-CM | POA: Diagnosis present

## 2012-03-12 DIAGNOSIS — F1510 Other stimulant abuse, uncomplicated: Secondary | ICD-10-CM | POA: Diagnosis present

## 2012-03-12 DIAGNOSIS — F319 Bipolar disorder, unspecified: Secondary | ICD-10-CM | POA: Diagnosis present

## 2012-03-12 DIAGNOSIS — F1410 Cocaine abuse, uncomplicated: Secondary | ICD-10-CM | POA: Diagnosis present

## 2012-03-12 DIAGNOSIS — Y929 Unspecified place or not applicable: Secondary | ICD-10-CM

## 2012-03-12 DIAGNOSIS — T43591A Poisoning by other antipsychotics and neuroleptics, accidental (unintentional), initial encounter: Secondary | ICD-10-CM | POA: Diagnosis present

## 2012-03-12 DIAGNOSIS — F1020 Alcohol dependence, uncomplicated: Secondary | ICD-10-CM | POA: Diagnosis present

## 2012-03-12 DIAGNOSIS — M6282 Rhabdomyolysis: Secondary | ICD-10-CM

## 2012-03-12 DIAGNOSIS — F10239 Alcohol dependence with withdrawal, unspecified: Secondary | ICD-10-CM | POA: Diagnosis present

## 2012-03-12 DIAGNOSIS — F1910 Other psychoactive substance abuse, uncomplicated: Secondary | ICD-10-CM

## 2012-03-12 DIAGNOSIS — F10939 Alcohol use, unspecified with withdrawal, unspecified: Secondary | ICD-10-CM | POA: Diagnosis present

## 2012-03-12 HISTORY — DX: Other psychoactive substance abuse, uncomplicated: F19.10

## 2012-03-12 HISTORY — DX: Bipolar disorder, unspecified: F31.9

## 2012-03-12 HISTORY — DX: Unspecified kidney failure: N19

## 2012-03-12 LAB — COMPREHENSIVE METABOLIC PANEL
ALT: 35 U/L (ref 0–53)
Alkaline Phosphatase: 51 U/L (ref 39–117)
CO2: 23 mEq/L (ref 19–32)
Chloride: 94 mEq/L — ABNORMAL LOW (ref 96–112)
GFR calc Af Amer: >90 mL/min (ref >90)
GFR calc non Af Amer: 89 mL/min — ABNORMAL LOW (ref >90)
Glucose, Bld: 62 mg/dL — ABNORMAL LOW (ref 70–99)
Potassium: 4 mEq/L (ref 3.5–5.1)
Sodium: 132 mEq/L — ABNORMAL LOW (ref 135–145)
Total Bilirubin: 0.8 mg/dL (ref 0.3–1.2)

## 2012-03-12 LAB — GLUCOSE, CAPILLARY: Glucose-Capillary: 112 mg/dL — ABNORMAL HIGH (ref 70–99)

## 2012-03-12 LAB — URINE MICROSCOPIC-ADD ON

## 2012-03-12 LAB — TROPONIN I
Troponin I: <0.3 ng/mL (ref <0.30)
Troponin I: <0.3 ng/mL (ref <0.30)

## 2012-03-12 LAB — CK TOTAL AND CKMB (NOT AT ARMC)
CK, MB: 13.5 ng/mL (ref 0.3–4.0)
Relative Index: 0.9 (ref 0.0–2.5)
Total CK: 2665 U/L — ABNORMAL HIGH (ref 7–232)
Total CK: 1974 U/L — ABNORMAL HIGH (ref 7–232)
Total CK: 1505 U/L — ABNORMAL HIGH (ref 7–232)

## 2012-03-12 LAB — CBC WITH DIFFERENTIAL/PLATELET
Hemoglobin: 14.2 g/dL (ref 13.0–17.0)
Lymphocytes Relative: 17 % (ref 12–46)
Lymphs Abs: 1.4 10*3/uL (ref 0.7–4.0)
Neutrophils Relative %: 66 % (ref 43–77)
Platelets: 269 10*3/uL (ref 150–400)
RBC: 4.43 MIL/uL (ref 4.22–5.81)
WBC: 8.5 10*3/uL (ref 4.0–10.5)

## 2012-03-12 LAB — URINALYSIS, ROUTINE W REFLEX MICROSCOPIC
Nitrite: NEGATIVE
Specific Gravity, Urine: 1.027 (ref 1.005–1.030)
Urobilinogen, UA: 0.2 mg/dL (ref 0.0–1.0)

## 2012-03-12 LAB — RAPID URINE DRUG SCREEN, HOSP PERFORMED: Amphetamines: POSITIVE — AB

## 2012-03-12 LAB — ETHANOL: Alcohol, Ethyl (B): <11 mg/dL (ref 0–11)

## 2012-03-12 LAB — CK: Total CK: 3459 U/L — ABNORMAL HIGH (ref 7–232)

## 2012-03-12 MED ORDER — SODIUM CHLORIDE 0.9 % IV SOLN
INTRAVENOUS | Status: DC
  Administered 2012-03-12: 11:00:00 via INTRAVENOUS

## 2012-03-12 MED ORDER — ONDANSETRON HCL 4 MG PO TABS: 4.0000 mg | ORAL_TABLET | Freq: Four times a day (QID) | ORAL | Status: DC | PRN

## 2012-03-12 MED ORDER — DEXTROSE 50 % IV SOLN
1.0000 | Freq: Once | INTRAVENOUS | Status: AC
  Administered 2012-03-12: 50 mL via INTRAVENOUS
  Filled 2012-03-12: qty 50

## 2012-03-12 MED ORDER — ENOXAPARIN SODIUM 30 MG/0.3ML ~~LOC~~ SOLN: 30.0000 mg | SUBCUTANEOUS | Status: DC

## 2012-03-12 MED ORDER — HYDROCODONE-ACETAMINOPHEN 5-325 MG PO TABS
1.0000 | ORAL_TABLET | ORAL | Status: DC | PRN
  Administered 2012-03-14 – 2012-03-16 (×7): 2 via ORAL
  Filled 2012-03-12 (×2): qty 2
  Filled 2012-03-12: qty 1
  Filled 2012-03-12 (×5): qty 2

## 2012-03-12 MED ORDER — ONDANSETRON HCL 4 MG/2ML IJ SOLN: 4.0000 mg | Freq: Four times a day (QID) | INTRAMUSCULAR | Status: DC | PRN

## 2012-03-12 MED ORDER — LORAZEPAM 2 MG/ML IJ SOLN
1.0000 mg | INTRAMUSCULAR | Status: DC | PRN
  Administered 2012-03-12 – 2012-03-14 (×8): 1 mg via INTRAVENOUS
  Filled 2012-03-12 (×8): qty 1

## 2012-03-12 MED ORDER — SODIUM CHLORIDE 0.9 % IV BOLUS (SEPSIS)
1000.0000 mL | Freq: Once | INTRAVENOUS | Status: AC
  Administered 2012-03-12: 1000 mL via INTRAVENOUS

## 2012-03-12 MED ORDER — SODIUM CHLORIDE 0.9 % IV SOLN
INTRAVENOUS | Status: DC
  Administered 2012-03-12: 23:00:00 via INTRAVENOUS
  Administered 2012-03-12: 150 mL/h via INTRAVENOUS

## 2012-03-12 MED ORDER — ENOXAPARIN SODIUM 40 MG/0.4ML ~~LOC~~ SOLN
40.0000 mg | SUBCUTANEOUS | Status: DC
  Administered 2012-03-12 – 2012-03-14 (×3): 40 mg via SUBCUTANEOUS
  Filled 2012-03-12 (×4): qty 0.4

## 2012-03-12 MED ORDER — SODIUM CHLORIDE 0.9 % IJ SOLN
3.0000 mL | Freq: Two times a day (BID) | INTRAMUSCULAR | Status: DC
  Administered 2012-03-12 – 2012-03-14 (×4): 3 mL via INTRAVENOUS

## 2012-03-12 NOTE — ED Notes (Signed)
Per EMS pt has been taking about 7grams of cocaine, 300mg  of oxycodone and some adderall over the past 2days. Pt c/o severe kidney pain and leg pain, states has had "kidney shutdown" in past d/t same. Pt denies SI, pt was picked up from a hotel room.

## 2012-03-12 NOTE — ED Notes (Signed)
Poison controlled contacted and alerted of pt. Poison control recommends supportive care at this point

## 2012-03-12 NOTE — Consult Note (Signed)
Reason for Consult: SI ? Referring Physician: unknown  Brandon [name] is an 47 y.o. male.  HPI:    Patient is 47 year old male with history of substance abuse, including cocaine and amphetamines, presents to [facility] long emergency department with altered mental status. Seen today and chart was reviewed. patient is unable to provide detailed history is he's very agitated and does not answer questions appropriately.  Reportedly patient has had chest discomfort but the details are not clear.  In emergency department, patient found to be agitated and reported suicidal ideations. urine drug screen positive for cocaine and amphetamines, rhabdomyolysis diagnosed given elevated CK values.   appeared to be confused now and his body is shaking. Admits SI now but no plans. Admits using drugs these days. Not sure why he is here now.   Past Medical History  Diagnosis Date  . Drug abuse   . Kidney failure   . Bipolar 1 disorder     History reviewed. No pertinent past surgical history.  Family History  Problem Relation Age of Onset  . Heart disease Mother   . Heart disease Father     Social History:  reports that he has never smoked. He has never used smokeless tobacco. He reports that he drinks alcohol. He reports that he uses illicit drugs (Cocaine and Methamphetamines).  Allergies: No Known Allergies  Medications: I have reviewed the patient's current medications.  Results for orders placed during the hospital encounter of 03/12/12 (from the past 48 hour(s))  LACTIC ACID, PLASMA     Status: Normal   Collection Time   03/12/12  8:50 AM      Component Value Range Comment   Lactic Acid, Venous 1.1  0.5 - 2.2 mmol/L   CBC WITH DIFFERENTIAL     Status: Abnormal   Collection Time   03/12/12  8:50 AM      Component Value Range Comment   WBC 8.5  4.0 - 10.5 K/uL    RBC 4.43  4.22 - 5.81 MIL/uL    Hemoglobin 14.2  13.0 - 17.0 g/dL    HCT 16.1  09.6 - 04.5 %    MCV 91.2  78.0 - 100.0 fL    MCH  32.1  26.0 - 34.0 pg    MCHC 35.1  30.0 - 36.0 g/dL    RDW 40.9  81.1 - 91.4 %    Platelets 269  150 - 400 K/uL    Neutrophils Relative 66  43 - 77 %    Neutro Abs 5.6  1.7 - 7.7 K/uL    Lymphocytes Relative 17  12 - 46 %    Lymphs Abs 1.4  0.7 - 4.0 K/uL    Monocytes Relative 16 (*) 3 - 12 %    Monocytes Absolute 1.3 (*) 0.1 - 1.0 K/uL    Eosinophils Relative 1  0 - 5 %    Eosinophils Absolute 0.1  0.0 - 0.7 K/uL    Basophils Relative 0  0 - 1 %    Basophils Absolute 0.0  0.0 - 0.1 K/uL   COMPREHENSIVE METABOLIC PANEL     Status: Abnormal   Collection Time   03/12/12  8:50 AM      Component Value Range Comment   Sodium 132 (*) 135 - 145 mEq/L    Potassium 4.0  3.5 - 5.1 mEq/L    Chloride 94 (*) 96 - 112 mEq/L    CO2 23  19 - 32 mEq/L    Glucose, Bld  62 (*) 70 - 99 mg/dL    BUN 19  6 - 23 mg/dL    Creatinine, Ser 4.09  0.50 - 1.35 mg/dL    Calcium 9.2  8.4 - 81.1 mg/dL    Total Protein 6.7  6.0 - 8.3 g/dL    Albumin 3.9  3.5 - 5.2 g/dL    AST 93 (*) 0 - 37 U/L    ALT 35  0 - 53 U/L    Alkaline Phosphatase 51  39 - 117 U/L    Total Bilirubin 0.8  0.3 - 1.2 mg/dL    GFR calc non Af Amer 89 (*) >90 mL/min    GFR calc Af Amer >90  >90 mL/min   ETHANOL     Status: Normal   Collection Time   03/12/12  8:50 AM      Component Value Range Comment   Alcohol, Ethyl (B) <11  0 - 11 mg/dL   CK     Status: Abnormal   Collection Time   03/12/12  8:50 AM      Component Value Range Comment   Total CK 3459 (*) 7 - 232 U/L   GLUCOSE, CAPILLARY     Status: Abnormal   Collection Time   03/12/12 10:53 AM      Component Value Range Comment   Glucose-Capillary 112 (*) 70 - 99 mg/dL   TROPONIN I     Status: Normal   Collection Time   03/12/12 11:10 AM      Component Value Range Comment   Troponin I <0.30  <0.30 ng/mL   CK TOTAL AND CKMB     Status: Abnormal   Collection Time   03/12/12 11:10 AM      Component Value Range Comment   Total CK 2665 (*) 7 - 232 U/L    CK, MB 23.5 (*) 0.3 - 4.0  ng/mL    Relative Index 0.9  0.0 - 2.5   URINE RAPID DRUG SCREEN (HOSP PERFORMED)     Status: Abnormal   Collection Time   03/12/12 11:31 AM      Component Value Range Comment   Opiates NONE DETECTED  NONE DETECTED    Cocaine POSITIVE (*) NONE DETECTED    Benzodiazepines NONE DETECTED  NONE DETECTED    Amphetamines POSITIVE (*) NONE DETECTED    Tetrahydrocannabinol NONE DETECTED  NONE DETECTED    Barbiturates NONE DETECTED  NONE DETECTED   URINALYSIS, ROUTINE W REFLEX MICROSCOPIC     Status: Abnormal   Collection Time   03/12/12 11:31 AM      Component Value Range Comment   Color, Urine YELLOW  YELLOW    APPearance CLEAR  CLEAR    Specific Gravity, Urine 1.027  1.005 - 1.030    pH 6.0  5.0 - 8.0    Glucose, UA 250 (*) NEGATIVE mg/dL    Hgb urine dipstick MODERATE (*) NEGATIVE    Bilirubin Urine SMALL (*) NEGATIVE    Ketones, ur >80 (*) NEGATIVE mg/dL    Protein, ur NEGATIVE  NEGATIVE mg/dL    Urobilinogen, UA 0.2  0.0 - 1.0 mg/dL    Nitrite NEGATIVE  NEGATIVE    Leukocytes, UA NEGATIVE  NEGATIVE   URINE MICROSCOPIC-ADD ON     Status: Normal   Collection Time   03/12/12 11:31 AM      Component Value Range Comment   Squamous Epithelial / LPF RARE  RARE    WBC, UA 0-2  <3 WBC/hpf  RBC / HPF 3-6  <3 RBC/hpf    Bacteria, UA RARE  RARE   TROPONIN I     Status: Normal   Collection Time   03/12/12  4:26 PM      Component Value Range Comment   Troponin I <0.30  <0.30 ng/mL   CK TOTAL AND CKMB     Status: Abnormal   Collection Time   03/12/12  4:26 PM      Component Value Range Comment   Total CK 1974 (*) 7 - 232 U/L    CK, MB 18.4 (*) 0.3 - 4.0 ng/mL CRITICAL VALUE NOTED.  VALUE IS CONSISTENT WITH PREVIOUSLY REPORTED AND CALLED VALUE.   Relative Index 0.9  0.0 - 2.5     No results found.  ROS Blood pressure 120/65, pulse 78, temperature 98 F (36.7 C), temperature source Oral, resp. rate 16, height 6\' 3"  (1.905 m), weight 113.399 kg (250 lb), SpO2 93.00%. Physical  Exam   Mental Status Examination/Evaluation:   Appearance: on bed confused   Eye Contact::  none   Speech: limited   Volume: low   Mood: no answer   Affect: ristricted   Thought Process: organized   Orientation: 1/4 person only   Thought Content: denies AVH   Suicidal Thoughts: yes no plans   Homicidal Thoughts: no   Memory: Recent; Poor   Judgement: Impaired   Insight: Lacking   Psychomotor Activity: slow   Concentration: poor   Recall: poor   Akathisia: No   Assessment:   AXIS I: Delirium  NOS, polysubstance dep AXIS II: Deferred   AXIS III: see emdical hx ?     AXIS IV: unknown   AXIS V: 15   ?   Treatment Plan/Recommendations:   - Likley delirium at this time    - use Zyprexa 5-10 mg q12 hour prn for agitation if needed   - will follow tomorrow  Wonda Cerise 03/12/2012, 9:38 PM

## 2012-03-12 NOTE — ED Notes (Signed)
Report given to carol, rn on floor 

## 2012-03-12 NOTE — ED Notes (Signed)
Pt given urinal, alerted he needs to urinate, pt reports he is unable to urinate at present

## 2012-03-12 NOTE — ED Notes (Signed)
rn called carrol, rn pts floor rn and alerted her of critical value

## 2012-03-12 NOTE — ED Notes (Signed)
Pt given phone and allowed to make phone call. Pt phone placed back in pts belonging bag.

## 2012-03-12 NOTE — ED Notes (Signed)
WJX:[id number]<WG> Expected date:<BR> Expected time:<BR> Means of arrival:<BR> Comments:<BR> OD

## 2012-03-12 NOTE — ED Notes (Signed)
Patient has been searched by security.

## 2012-03-12 NOTE — H&P (Signed)
[facility] History and Physical  Brandon [name] ZOX:[id number] DOB: 02-Nov-1965 DOA: 03/12/2012  Referring physician: ED physician PCP: No primary provider on file.   Chief Complaint: Altered mental status  HPI:  Patient is 47 year old male with history of substance abuse, including cocaine and amphetamines, presents to [facility] emergency department with altered mental status. Please note that patient is unable to provide detailed history is he's very agitated and does not answer questions appropriately. Most of the history is obtained from available records and emergency department physician. Reportedly patient has had chest discomfort but the details are not clear.  In emergency department, patient found to be agitated and suicidal, urine drug screen positive for cocaine and amphetamines, rhabdomyolysis diagnosed given elevated CK values. Patient given 1 L bolus of normal saline.  Assessment and Plan:  Principal Problem:  *Altered mental state - Secondary to overdose on substances, including cocaine and amphetamines - Alcohol level undetected on admission - We will admit patient to telemetry bed as UDS is positive for cocaine and there was questionable chest discomfort on admission - We'll continue to cycle cardiac enzymes, including troponin and CK values - Obtain 12-lead EKG - Provide supportive care, oxygen as needed and analgesia as needed Active Problems:  Suicidal ideation - Suicide precautions implemented, sitter at bedside - Psych consult obtained  Substance abuse - UDS positive for cocaine and amphetamine - Consultation for cessation will need to be provided once patient able to participate   Rhabdomyolysis - Secondary to cocaine use - We'll continue to provide supportive care with IV fluids and analgesia as needed - Continued to trend CK values   Code Status: Full Family Communication: No family present at bedside Disposition Plan: Admit to telemetry  unit  Review of Systems:  Unable to obtain from patient as patient is very agitated with altered mental status.  Past Medical History  Diagnosis Date  . Drug abuse   . Kidney failure     History reviewed. No pertinent past surgical history.  Social History:  does not have a smoking history on file. He does not have any smokeless tobacco history on file. He reports that he drinks alcohol. He reports that he uses illicit drugs (Cocaine).  No family history of cancers or heart disease.  Prior to Admission medications   Not on File    Physical Exam: Filed Vitals:   03/12/12 0843 03/12/12 0929 03/12/12 1000  BP: 111/62 118/66 122/59  Pulse: 78 81 82  Temp: 97.3 F (36.3 C) 97.7 F (36.5 C)   TempSrc: Oral Oral   Resp: 24 30 27   SpO2: 94% 100% 99%    Physical Exam  Constitutional: Agitated, speech tangential, not in acute distress HENT: Normocephalic. External right and left ear normal. Oropharynx is dry and MM dry Eyes: Conjunctivae and EOM are normal. PERRLA, no scleral icterus.  Neck: Normal ROM. Neck supple. No JVD. No tracheal deviation. No thyromegaly.  CVS: RRR, S1/S2 +, no murmurs, no gallops, no carotid bruit.  Pulmonary: Effort and breath sounds normal, no stridor, rhonchi, wheezes, rales.  Abdominal: Soft. BS +,  no distension, tenderness, rebound or guarding.  Musculoskeletal: Normal range of motion. No edema and no tenderness.  Lymphadenopathy: No lymphadenopathy noted, cervical, inguinal. Neuro: Alert but agitated. Unable to perform detailed examination at patient not cooperative. Overall appears to be nonfocal  Skin: Skin is warm and dry. No rash noted. Not diaphoretic. No erythema. No pallor.  Psychiatric: Agitated  Labs on Admission:  Basic Metabolic Panel:  Lab 03/12/12 0850  NA 132*  K 4.0  CL 94*  CO2 23  GLUCOSE 62*  BUN 19  CREATININE 1.00  CALCIUM 9.2  MG --  PHOS --   Liver Function Tests:  Lab 03/12/12 0850  AST 93*  ALT 35   ALKPHOS 51  BILITOT 0.8  PROT 6.7  ALBUMIN 3.9   CBC:  Lab 03/12/12 0850  WBC 8.5  NEUTROABS 5.6  HGB 14.2  HCT 40.4  MCV 91.2  PLT 269   Cardiac Enzymes:  Lab 03/12/12 0850  CKTOTAL 3459*  CKMB --  CKMBINDEX --  TROPONINI --   Radiological Exams on Admission: No results found.  EKG: Normal sinus rhythm, no ST/T wave changes  Debbora Presto, MD  [facility] Pager [phone]  If 7PM-7AM, please contact night-coverage [email or web address] Password [facility] 03/12/2012, 10:46 AM

## 2012-03-12 NOTE — Progress Notes (Signed)
CRITICAL VALUE ALERT  Critical value received:  CKMB 23.5  Date of notification:  03/12/12  Time of notification:  1200  Critical value read back:yes  Nurse who received alert:  CCM  MD notified (1st page):  Dr. Izola Price  Time of first page:  1230  MD notified (2nd page):  Time of second page:  Responding MD:  Dr. Izola Price   Time MD responded:  1250

## 2012-03-12 NOTE — ED Provider Notes (Signed)
History     CSN: [id number]  Arrival date & time 03/12/12  2956   First MD Initiated Contact with Patient 03/12/12 [phone]      Chief Complaint  Patient presents with  . Drug Overdose    (Consider location/radiation/quality/duration/timing/severity/associated sxs/prior treatment) The history is provided by the patient and the EMS personnel.   patient here complaining of whole-body pain after recent heavy cocaine, OxyContin, Adderall use. States that he's been on a binge and that he is concerned that his kidneys might be in failure. History of same in the past. He denies attempting suicide. No auditory or visual hallucinations. Denies any anginal type chest pain. Called EMS and was transported here  Past Medical History  Diagnosis Date  . Drug abuse   . Kidney failure     History reviewed. No pertinent past surgical history.  No family history on file.  History  Substance Use Topics  . Smoking status: Not on file  . Smokeless tobacco: Not on file  . Alcohol Use: Yes      Review of Systems  All other systems reviewed and are negative.    Allergies  Review of patient's allergies indicates not on file.  Home Medications  No current outpatient prescriptions on file.  There were no vitals taken for this visit.  Physical Exam  Nursing note and vitals reviewed. Constitutional: He is oriented to person, place, and time. He appears well-developed and well-nourished.  Non-toxic appearance. No distress.  HENT:  Head: Normocephalic and atraumatic.  Eyes: Conjunctivae normal, EOM and lids are normal. Pupils are equal, round, and reactive to light.  Neck: Normal range of motion. Neck supple. No tracheal deviation present. No mass present.  Cardiovascular: Normal rate, regular rhythm and normal heart sounds.  Exam reveals no gallop.   No murmur heard. Pulmonary/Chest: Effort normal and breath sounds normal. No stridor. No respiratory distress. He has no decreased breath  sounds. He has no wheezes. He has no rhonchi. He has no rales.  Abdominal: Soft. Normal appearance and bowel sounds are normal. He exhibits no distension. There is no tenderness. There is no rebound and no CVA tenderness.  Musculoskeletal: Normal range of motion. He exhibits no edema and no tenderness.  Neurological: He is alert and oriented to person, place, and time. He has normal strength. No cranial nerve deficit or sensory deficit. GCS eye subscore is 4. GCS verbal subscore is 5. GCS motor subscore is 6.  Skin: Skin is warm and dry. No abrasion and no rash noted.  Psychiatric: His speech is normal. His mood appears anxious. He is agitated. He expresses no homicidal and no suicidal ideation.    ED Course  Procedures (including critical care time)   Labs Reviewed  LACTIC ACID, PLASMA  CBC WITH DIFFERENTIAL  COMPREHENSIVE METABOLIC PANEL  URINE RAPID DRUG SCREEN (HOSP PERFORMED)  ETHANOL  CK   No results found.   No diagnosis found.    MDM  Patient given IV fluids here and will be admitted for observation for early rhabdomyolysis        Toy Baker, MD 03/12/12 1032

## 2012-03-13 DIAGNOSIS — R34 Anuria and oliguria: Secondary | ICD-10-CM

## 2012-03-13 LAB — BASIC METABOLIC PANEL
BUN: 11 mg/dL (ref 6–23)
CO2: 25 mEq/L (ref 19–32)
Glucose, Bld: 103 mg/dL — ABNORMAL HIGH (ref 70–99)
Potassium: 3.4 mEq/L — ABNORMAL LOW (ref 3.5–5.1)
Sodium: 139 mEq/L (ref 135–145)

## 2012-03-13 LAB — CBC
Hemoglobin: 12.4 g/dL — ABNORMAL LOW (ref 13.0–17.0)
MCH: 31.1 pg (ref 26.0–34.0)
MCHC: 33.8 g/dL (ref 30.0–36.0)
MCV: 92 fL (ref 78.0–100.0)
RBC: 3.99 MIL/uL — ABNORMAL LOW (ref 4.22–5.81)

## 2012-03-13 LAB — GLUCOSE, CAPILLARY: Glucose-Capillary: 82 mg/dL (ref 70–99)

## 2012-03-13 MED ORDER — SODIUM CHLORIDE 0.9 % IV SOLN
INTRAVENOUS | Status: DC
  Administered 2012-03-13 (×3): via INTRAVENOUS

## 2012-03-13 MED ORDER — ZOLPIDEM TARTRATE 5 MG PO TABS
5.0000 mg | ORAL_TABLET | Freq: Once | ORAL | Status: AC
  Administered 2012-03-13: 5 mg via ORAL
  Filled 2012-03-13: qty 1

## 2012-03-13 MED ORDER — ZOLPIDEM TARTRATE 10 MG PO TABS
10.0000 mg | ORAL_TABLET | Freq: Once | ORAL | Status: AC
  Administered 2012-03-13: 10 mg via ORAL
  Filled 2012-03-13: qty 1

## 2012-03-13 NOTE — Progress Notes (Signed)
Chaplain visited with pt in response to spiritual care consult.   Pt was sleeping at time of chaplain visit.  Sitter with pt reported that he had been somewhat agitated, chaplain and sitter decided to allow pt to rest.  Chaplain will continue to follow and check back with pt in morning.    Belva Crome  MDiv, Chaplain

## 2012-03-13 NOTE — Progress Notes (Signed)
Voided 900cc, bladder scan PVR shows 347 cc in bladder.

## 2012-03-13 NOTE — Progress Notes (Signed)
Attempted to meet with Pt.  NT giving Pt a bath.  CSW to continue to follow.  Providence Crosby, LCSWA Clinical Social Work [phone]

## 2012-03-13 NOTE — Progress Notes (Signed)
CRITICAL VALUE ALERT  Critical value received:  CKMB 13.5  Date of notification:  03/12/12  Time of notification:  23:04  Critical value read back:yes  Nurse who received alert:  Christell Faith, RN  MD notified (1st page):  Kirtland Bouchard. Schorr (floor coverage 7p-7a)  Time of first page:  23:07  MD notified (2nd page): N/A  Time of second page: N/A  Responding MD:  Merdis Delay  Time MD responded:  23:30   MD called back with no new orders for patient.

## 2012-03-13 NOTE — Consult Note (Addendum)
Reason for Consult: SI ? Referring Physician: unknown  Brandon [name] is an 47 y.o. male.  HPI:    Patient is 47 year old male with history of substance abuse, including cocaine and amphetamines, presents to [facility] emergency department with altered mental status. Seen today and chart was reviewed. patient is unable to provide detailed history is he's very agitated and does not answer questions appropriately.  Reportedly patient has had chest discomfort but the details are not clear.  In emergency department, patient found to be agitated and reported suicidal ideations. urine drug screen positive for cocaine and amphetamines, rhabdomyolysis diagnosed given elevated CK values.    Interval Hx:  More calmer and organized now.but his body is restless. Per pt its his baseline but per father it is not.  denies any SI now or before coming here. Father is not concerned about his safety either. Admits using drugs these days.  Past Medical History  Diagnosis Date  . Drug abuse   . Kidney failure   . Bipolar 1 disorder     History reviewed. No pertinent past surgical history.  Family History  Problem Relation Age of Onset  . Heart disease Mother   . Heart disease Father     Social History:  reports that he has never smoked. He has never used smokeless tobacco. He reports that he drinks alcohol. He reports that he uses illicit drugs (Cocaine and Methamphetamines).  Allergies: No Known Allergies  Medications: I have reviewed the patient's current medications.  Results for orders placed during the hospital encounter of 03/12/12 (from the past 48 hour(s))  LACTIC ACID, PLASMA     Status: Normal   Collection Time   03/12/12  8:50 AM      Component Value Range Comment   Lactic Acid, Venous 1.1  0.5 - 2.2 mmol/L   CBC WITH DIFFERENTIAL     Status: Abnormal   Collection Time   03/12/12  8:50 AM      Component Value Range Comment   WBC 8.5  4.0 - 10.5 K/uL    RBC 4.43  4.22 - 5.81 MIL/uL      Hemoglobin 14.2  13.0 - 17.0 g/dL    HCT 65.7  84.6 - 96.2 %    MCV 91.2  78.0 - 100.0 fL    MCH 32.1  26.0 - 34.0 pg    MCHC 35.1  30.0 - 36.0 g/dL    RDW 95.2  84.1 - 32.4 %    Platelets 269  150 - 400 K/uL    Neutrophils Relative 66  43 - 77 %    Neutro Abs 5.6  1.7 - 7.7 K/uL    Lymphocytes Relative 17  12 - 46 %    Lymphs Abs 1.4  0.7 - 4.0 K/uL    Monocytes Relative 16 (*) 3 - 12 %    Monocytes Absolute 1.3 (*) 0.1 - 1.0 K/uL    Eosinophils Relative 1  0 - 5 %    Eosinophils Absolute 0.1  0.0 - 0.7 K/uL    Basophils Relative 0  0 - 1 %    Basophils Absolute 0.0  0.0 - 0.1 K/uL   COMPREHENSIVE METABOLIC PANEL     Status: Abnormal   Collection Time   03/12/12  8:50 AM      Component Value Range Comment   Sodium 132 (*) 135 - 145 mEq/L    Potassium 4.0  3.5 - 5.1 mEq/L    Chloride 94 (*) 96 -  112 mEq/L    CO2 23  19 - 32 mEq/L    Glucose, Bld 62 (*) 70 - 99 mg/dL    BUN 19  6 - 23 mg/dL    Creatinine, Ser 7.82  0.50 - 1.35 mg/dL    Calcium 9.2  8.4 - 95.6 mg/dL    Total Protein 6.7  6.0 - 8.3 g/dL    Albumin 3.9  3.5 - 5.2 g/dL    AST 93 (*) 0 - 37 U/L    ALT 35  0 - 53 U/L    Alkaline Phosphatase 51  39 - 117 U/L    Total Bilirubin 0.8  0.3 - 1.2 mg/dL    GFR calc non Af Amer 89 (*) >90 mL/min    GFR calc Af Amer >90  >90 mL/min   ETHANOL     Status: Normal   Collection Time   03/12/12  8:50 AM      Component Value Range Comment   Alcohol, Ethyl (B) <11  0 - 11 mg/dL   CK     Status: Abnormal   Collection Time   03/12/12  8:50 AM      Component Value Range Comment   Total CK 3459 (*) 7 - 232 U/L   GLUCOSE, CAPILLARY     Status: Abnormal   Collection Time   03/12/12 10:53 AM      Component Value Range Comment   Glucose-Capillary 112 (*) 70 - 99 mg/dL   TROPONIN I     Status: Normal   Collection Time   03/12/12 11:10 AM      Component Value Range Comment   Troponin I <0.30  <0.30 ng/mL   CK TOTAL AND CKMB     Status: Abnormal   Collection Time   03/12/12  11:10 AM      Component Value Range Comment   Total CK 2665 (*) 7 - 232 U/L    CK, MB 23.5 (*) 0.3 - 4.0 ng/mL    Relative Index 0.9  0.0 - 2.5   URINE RAPID DRUG SCREEN (HOSP PERFORMED)     Status: Abnormal   Collection Time   03/12/12 11:31 AM      Component Value Range Comment   Opiates NONE DETECTED  NONE DETECTED    Cocaine POSITIVE (*) NONE DETECTED    Benzodiazepines NONE DETECTED  NONE DETECTED    Amphetamines POSITIVE (*) NONE DETECTED    Tetrahydrocannabinol NONE DETECTED  NONE DETECTED    Barbiturates NONE DETECTED  NONE DETECTED   URINALYSIS, ROUTINE W REFLEX MICROSCOPIC     Status: Abnormal   Collection Time   03/12/12 11:31 AM      Component Value Range Comment   Color, Urine YELLOW  YELLOW    APPearance CLEAR  CLEAR    Specific Gravity, Urine 1.027  1.005 - 1.030    pH 6.0  5.0 - 8.0    Glucose, UA 250 (*) NEGATIVE mg/dL    Hgb urine dipstick MODERATE (*) NEGATIVE    Bilirubin Urine SMALL (*) NEGATIVE    Ketones, ur >80 (*) NEGATIVE mg/dL    Protein, ur NEGATIVE  NEGATIVE mg/dL    Urobilinogen, UA 0.2  0.0 - 1.0 mg/dL    Nitrite NEGATIVE  NEGATIVE    Leukocytes, UA NEGATIVE  NEGATIVE   URINE MICROSCOPIC-ADD ON     Status: Normal   Collection Time   03/12/12 11:31 AM      Component Value Range Comment  Squamous Epithelial / LPF RARE  RARE    WBC, UA 0-2  <3 WBC/hpf    RBC / HPF 3-6  <3 RBC/hpf    Bacteria, UA RARE  RARE   TROPONIN I     Status: Normal   Collection Time   03/12/12  4:26 PM      Component Value Range Comment   Troponin I <0.30  <0.30 ng/mL   CK TOTAL AND CKMB     Status: Abnormal   Collection Time   03/12/12  4:26 PM      Component Value Range Comment   Total CK 1974 (*) 7 - 232 U/L    CK, MB 18.4 (*) 0.3 - 4.0 ng/mL CRITICAL VALUE NOTED.  VALUE IS CONSISTENT WITH PREVIOUSLY REPORTED AND CALLED VALUE.   Relative Index 0.9  0.0 - 2.5   TROPONIN I     Status: Normal   Collection Time   03/12/12 10:26 PM      Component Value Range Comment    Troponin I <0.30  <0.30 ng/mL   CK TOTAL AND CKMB     Status: Abnormal   Collection Time   03/12/12 10:26 PM      Component Value Range Comment   Total CK 1505 (*) 7 - 232 U/L    CK, MB 13.5 (*) 0.3 - 4.0 ng/mL    Relative Index 0.9  0.0 - 2.5   CK TOTAL AND CKMB     Status: Abnormal   Collection Time   03/13/12  4:50 AM      Component Value Range Comment   Total CK 1158 (*) 7 - 232 U/L    CK, MB 10.4 (*) 0.3 - 4.0 ng/mL CRITICAL VALUE NOTED.  VALUE IS CONSISTENT WITH PREVIOUSLY REPORTED AND CALLED VALUE.   Relative Index 0.9  0.0 - 2.5   BASIC METABOLIC PANEL     Status: Abnormal   Collection Time   03/13/12  4:50 AM      Component Value Range Comment   Sodium 139  135 - 145 mEq/L    Potassium 3.4 (*) 3.5 - 5.1 mEq/L    Chloride 107  96 - 112 mEq/L    CO2 25  19 - 32 mEq/L    Glucose, Bld 103 (*) 70 - 99 mg/dL    BUN 11  6 - 23 mg/dL    Creatinine, Ser 1.61  0.50 - 1.35 mg/dL    Calcium 8.2 (*) 8.4 - 10.5 mg/dL    GFR calc non Af Amer >90  >90 mL/min    GFR calc Af Amer >90  >90 mL/min   CBC     Status: Abnormal   Collection Time   03/13/12  4:50 AM      Component Value Range Comment   WBC 4.2  4.0 - 10.5 K/uL    RBC 3.99 (*) 4.22 - 5.81 MIL/uL    Hemoglobin 12.4 (*) 13.0 - 17.0 g/dL    HCT 09.6 (*) 04.5 - 52.0 %    MCV 92.0  78.0 - 100.0 fL    MCH 31.1  26.0 - 34.0 pg    MCHC 33.8  30.0 - 36.0 g/dL    RDW 40.9  81.1 - 91.4 %    Platelets 223  150 - 400 K/uL   GLUCOSE, CAPILLARY     Status: Normal   Collection Time   03/13/12  7:54 AM      Component Value Range Comment   Glucose-Capillary  82  70 - 99 mg/dL   CK TOTAL AND CKMB     Status: Abnormal   Collection Time   03/13/12 11:03 AM      Component Value Range Comment   Total CK 915 (*) 7 - 232 U/L    CK, MB 8.1 (*) 0.3 - 4.0 ng/mL CRITICAL VALUE NOTED.  VALUE IS CONSISTENT WITH PREVIOUSLY REPORTED AND CALLED VALUE.   Relative Index 0.9  0.0 - 2.5     No results found.  ROS  Blood pressure 122/70, pulse 65,  temperature 97.6 F (36.4 C), temperature source Oral, resp. rate 18, height 6\' 3"  (1.905 m), weight 113.399 kg (250 lb), SpO2 97.00%. Physical Exam    Mental Status Examination/Evaluation:   Appearance: on bed confused   Eye Contact::  none   Speech: limited   Volume: low   Mood: no answer   Affect: ristricted   Thought Process: organized   Orientation: 1/4 person only   Thought Content: denies AVH   Suicidal Thoughts: yes no plans   Homicidal Thoughts: no   Memory: Recent; Poor   Judgement: Impaired   Insight: Lacking   Psychomotor Activity: slow   Concentration: poor   Recall: poor   Akathisia: No   Assessment:   AXIS I: Delirium  NOS (resolving), polysubstance dep AXIS II: Deferred   AXIS III: see emdical hx ?     AXIS IV: unknown   AXIS V: 15   ?   Treatment Plan/Recommendations:   - Likley delirium at this time and it is resolving.   - recommend drug rehab but pt is not interested. Will recommend to give drug rehab info at discharge. Psy SW can help. No enough evidence to commit him against his will at this time in terms of safety. But if primary team thinks he should stay here for medical reasons and he refuse that we can evaluate him for capapcity    - use Zyprexa 5-10 mg q12 hour prn for agitation if needed   - will follow as needed  Wonda Cerise 03/13/2012, 10:27 PM

## 2012-03-13 NOTE — Discharge Summary (Deleted)
Physician Discharge Summary  Maylon Sailors ZOX:[id number] DOB: 1965-03-03 DOA: 03/12/2012  PCP: No primary provider on file.  Admit date: 03/12/2012 AMA date: 03/13/2012    Recommendations for Outpatient Follow-up:  1. Primary care doctor within 1 week for ongoing substance abuse counseling  Discharge Diagnoses:  Principal Problem:  *Altered mental state Active Problems:  Suicidal ideation  Substance abuse  Rhabdomyolysis  Oliguria   Discharge Condition: fair  Diet recommendation: healthy heart  Wt Readings from Last 3 Encounters:  03/12/12 113.399 kg (250 lb)    History of present illness:   Patient is 47 year old male with history of substance abuse, including cocaine and amphetamines, presents to [facility] emergency department with altered mental status. Please note that patient is unable to provide detailed history is he's very agitated and does not answer questions appropriately. Most of the history is obtained from available records and emergency department physician. Reportedly patient has had chest discomfort but the details are not clear.  In emergency department, patient found to be agitated and suicidal, urine drug screen positive for cocaine and amphetamines, rhabdomyolysis diagnosed given elevated CK values. Patient given 1 L bolus of normal saline.    Hospital Course:   [name] was hospitalized with cocaine and amphetamine overdose and altered mental status.  He also had rhabdomyolysis.  His altered mental status improved after 24 hours.  He was given IVF and although his urine output was initially low, it started to improved and he made of urine on 1/15 prior to leaving AMA.  His creatinine was stable.  The patient was advised that he should be monitored for signs of drug and alcohol withdrawal and be given additional hydration for treatment of his rhabdomyolysis.  He risks death and kidney failure.     Procedures:  None  Consultations:  Psychiatry  Discharge Exam: Filed Vitals:   03/13/12 1331  BP: 120/60  Pulse: 70  Temp: 97.9 F (36.6 C)  Resp: 18   Filed Vitals:   03/12/12 2010 03/12/12 2100 03/13/12 0602 03/13/12 1331  BP:  115/63 107/53 120/60  Pulse:  70 68 70  Temp:  97.3 F (36.3 C) 98.2 F (36.8 C) 97.9 F (36.6 C)  TempSrc:  Oral Oral Oral  Resp:  16 18 18   Height: 6\' 3"  (1.905 m)     Weight:      SpO2:  97% 97% 97%    General: Caucasian male, no acute distress, sleepy but arouseable  HEENT: NCAT, MMM  Cardiovascular: RRR, no murmurs, rubs, or gallops, 2+ pulses  Respiratory: CTAB, no increased WOB  Abdomen: NABS, soft, nondistended, nontender, no organomegaly  MSK: Normal tone and bulk  Neuro: Grossly intact  Discharge Instructions     Medication List     As of 03/13/2012  5:23 PM    ASK your doctor about these medications         ziprasidone 60 MG capsule   Commonly known as: GEODON   Take 60 mg by mouth 2 (two) times daily with a meal.          The results of significant diagnostics from this hospitalization (including imaging, microbiology, ancillary and laboratory) are listed below for reference.    Significant Diagnostic Studies: No results found.  Microbiology: No results found for this or any previous visit (from the past 240 hour(s)).   Labs: Basic Metabolic Panel:  Lab 03/13/12 8119 03/12/12 0850  NA 139 132*  K 3.4* 4.0  CL 107 94*  CO2  25 23  GLUCOSE 103* 62*  BUN 11 19  CREATININE 0.95 1.00  CALCIUM 8.2* 9.2  MG -- --  PHOS -- --   Liver Function Tests:  Lab 03/12/12 0850  AST 93*  ALT 35  ALKPHOS 51  BILITOT 0.8  PROT 6.7  ALBUMIN 3.9   No results found for this basename: LIPASE:5,AMYLASE:5 in the last 168 hours No results found for this basename: AMMONIA:5 in the last 168 hours CBC:  Lab 03/13/12 0450 03/12/12 0850  WBC 4.2 8.5  NEUTROABS -- 5.6  HGB 12.4* 14.2  HCT 36.7* 40.4  MCV 92.0  91.2  PLT 223 269   Cardiac Enzymes:  Lab 03/13/12 1103 03/13/12 0450 03/12/12 2226 03/12/12 1626 03/12/12 1110  CKTOTAL 915* 1158* 1505* 1974* 2665*  CKMB 8.1* 10.4* 13.5* 18.4* 23.5*  CKMBINDEX -- -- -- -- --  TROPONINI -- -- <0.30 <0.30 <0.30   BNP: BNP (last 3 results) No results found for this basename: PROBNP:3 in the last 8760 hours CBG:  Lab 03/13/12 0754 03/12/12 1053  GLUCAP 82 112*    Time coordinating discharge: 30 minutes  Signed:  Lucius Wise  [facility] 03/13/2012, 5:23 PM

## 2012-03-13 NOTE — Progress Notes (Signed)
   CARE MANAGEMENT NOTE 03/13/2012  Patient:  Brandon [name], Brandon [name]   Account Number:  [id number]  Date Initiated:  03/13/2012  Documentation initiated by:  Jiles Crocker  Subjective/Objective Assessment:   ADMITTED WITH OVERDOSE     Action/Plan:   SOC WORKER REFERRAL PLACED FOR POSSIBLE INPT PSYCH/ SUBSTANCE ABUSE   Anticipated DC Date:  03/20/2012   Anticipated DC Plan:  [facility]  In-house referral  Clinical Social Worker      DC Planning Services  CM consult              Status of service:  In process, will continue to follow Medicare Important Message given?  NA - LOS <3 / Initial given by admissions (If response is "NO", the following Medicare IM given date fields will be blank)  Per UR Regulation:  Reviewed for med. necessity/level of care/duration of stay  Comments:  03/13/2012- B Aarnav Steagall RN,BSN,MHA

## 2012-03-13 NOTE — Progress Notes (Signed)
[facility] PROGRESS NOTE  Brandon [name] ZOX:[id number] DOB: 09-Feb-1966 DOA: 03/12/2012 PCP: No primary provider on file.  Assessment/Plan   *Altered mental state, likely drug induced delirium resolving - Likely secondary to overdose on substances, including cocaine and amphetamines  - Alcohol level undetected on admission  - troponin neg x 3 - EKG NSR  Active Problems:  Suicidal ideation  - Suicide precautions implemented, sitter at bedside  - Appreciate Psychiatry assistance  Substance abuse  - UDS positive for cocaine and amphetamine  - Consultation for cessation will need to be provided once patient able to participate   Rhabdomyolysis CPK slowly trending down, but decreased uop.  Patient refuses foley - Secondary to cocaine use  - Increase IV fluids  - post void residual - Repeat CK in AM  Diet:  Regular Access:  PIV IVF:  NS at 200 ml/h Proph:  lovenox  Code Status: full code Family Communication: spoke with patient alone Disposition Plan: pending resolution of rhabdo, psychiatry clearance, further counseling on drug abstinence   Consultants:  Psychiatry  Procedures:  none  Antibiotics:  none   HPI/Subjective:  Patient states that he feels his thinking is clearer and he feels better.  He voiced to the nurse that he is anxious about his job.  Denies headache, chest pain, shorntess of breath, N/V/D/C.  Voided once yesterday around noon per patient and sitter.    Objective: Filed Vitals:   03/12/12 1230 03/12/12 2010 03/12/12 2100 03/13/12 0602  BP: 120/65  115/63 107/53  Pulse: 78  70 68  Temp: 98 F (36.7 C)  97.3 F (36.3 C) 98.2 F (36.8 C)  TempSrc: Oral  Oral Oral  Resp: 16  16 18   Height:  6\' 3"  (1.905 m)    Weight:      SpO2: 93%  97% 97%    Intake/Output Summary (Last 24 hours) at 03/13/12 0810 Last data filed at 03/12/12 2248  Gross per 24 hour  Intake    833 ml  Output      0 ml  Net    833 ml   Filed Weights   03/12/12  1030  Weight: 113.399 kg (250 lb)    Exam:   General:  Caucasian male, no acute distress, sleepy but arouseable  HEENT:  NCAT, MMM  Cardiovascular:  RRR, no murmurs, rubs, or gallops, 2+ pulses  Respiratory:  CTAB, no increased WOB  Abdomen:  NABS, soft, nondistended, nontender, no organomegaly  MSK:  Normal tone and bulk  Neuro:  Grossly intact  Data Reviewed: Basic Metabolic Panel:  Lab 03/13/12 8119 03/12/12 0850  NA 139 132*  K 3.4* 4.0  CL 107 94*  CO2 25 23  GLUCOSE 103* 62*  BUN 11 19  CREATININE 0.95 1.00  CALCIUM 8.2* 9.2  MG -- --  PHOS -- --   Liver Function Tests:  Lab 03/12/12 0850  AST 93*  ALT 35  ALKPHOS 51  BILITOT 0.8  PROT 6.7  ALBUMIN 3.9   No results found for this basename: LIPASE:5,AMYLASE:5 in the last 168 hours No results found for this basename: AMMONIA:5 in the last 168 hours CBC:  Lab 03/13/12 0450 03/12/12 0850  WBC 4.2 8.5  NEUTROABS -- 5.6  HGB 12.4* 14.2  HCT 36.7* 40.4  MCV 92.0 91.2  PLT 223 269   Cardiac Enzymes:  Lab 03/13/12 0450 03/12/12 2226 03/12/12 1626 03/12/12 1110 03/12/12 0850  CKTOTAL 1158* 1505* 1974* 2665* 3459*  CKMB 10.4* 13.5* 18.4* 23.5* --  CKMBINDEX -- -- -- -- --  TROPONINI -- <0.30 <0.30 <0.30 --   BNP (last 3 results) No results found for this basename: PROBNP:3 in the last 8760 hours CBG:  Lab 03/13/12 0754 03/12/12 1053  GLUCAP 82 112*    No results found for this or any previous visit (from the past 240 hour(s)).   Studies: No results found.  Scheduled Meds:   . enoxaparin (LOVENOX) injection  40 mg Subcutaneous Q24H  . sodium chloride  3 mL Intravenous Q12H   Continuous Infusions:   . sodium chloride 150 mL/hr at 03/12/12 2247    Principal Problem:  *Altered mental state Active Problems:  Suicidal ideation  Substance abuse  Rhabdomyolysis    Time spent: 30 min    Ervin Hensley, [facility]  [facility] Pager [phone]. If 8PM-8AM, please contact  night-coverage at [email or web address], password [facility] 03/13/2012, 8:10 AM  LOS: 1 day

## 2012-03-14 DIAGNOSIS — R404 Transient alteration of awareness: Secondary | ICD-10-CM

## 2012-03-14 DIAGNOSIS — F1920 Other psychoactive substance dependence, uncomplicated: Secondary | ICD-10-CM

## 2012-03-14 LAB — BASIC METABOLIC PANEL
CO2: 25 mEq/L (ref 19–32)
Chloride: 105 mEq/L (ref 96–112)
Glucose, Bld: 89 mg/dL (ref 70–99)
Potassium: 3.6 mEq/L (ref 3.5–5.1)
Sodium: 138 mEq/L (ref 135–145)

## 2012-03-14 LAB — CBC
HCT: 36.3 % — ABNORMAL LOW (ref 39.0–52.0)
Hemoglobin: 12.4 g/dL — ABNORMAL LOW (ref 13.0–17.0)
RBC: 3.94 MIL/uL — ABNORMAL LOW (ref 4.22–5.81)

## 2012-03-14 LAB — GLUCOSE, CAPILLARY: Glucose-Capillary: 89 mg/dL (ref 70–99)

## 2012-03-14 MED ORDER — THIAMINE HCL 100 MG/ML IJ SOLN
100.0000 mg | Freq: Every day | INTRAMUSCULAR | Status: DC
  Filled 2012-03-14 (×3): qty 1

## 2012-03-14 MED ORDER — LORAZEPAM 2 MG/ML IJ SOLN
1.0000 mg | Freq: Once | INTRAMUSCULAR | Status: AC
  Administered 2012-03-14: 1 mg via INTRAVENOUS
  Filled 2012-03-14: qty 1

## 2012-03-14 MED ORDER — ENOXAPARIN SODIUM 60 MG/0.6ML ~~LOC~~ SOLN
60.0000 mg | SUBCUTANEOUS | Status: DC
  Administered 2012-03-15: 60 mg via SUBCUTANEOUS
  Filled 2012-03-14 (×2): qty 0.6

## 2012-03-14 MED ORDER — LORAZEPAM 2 MG/ML IJ SOLN
1.0000 mg | Freq: Four times a day (QID) | INTRAMUSCULAR | Status: DC | PRN
  Administered 2012-03-14 – 2012-03-15 (×5): 1 mg via INTRAVENOUS
  Filled 2012-03-14 (×6): qty 1

## 2012-03-14 MED ORDER — LORAZEPAM 2 MG/ML IJ SOLN: 0.0000 mg | Freq: Four times a day (QID) | INTRAMUSCULAR | Status: DC

## 2012-03-14 MED ORDER — LORAZEPAM 2 MG/ML IJ SOLN: 0.0000 mg | Freq: Two times a day (BID) | INTRAMUSCULAR | Status: DC

## 2012-03-14 MED ORDER — VITAMIN B-1 100 MG PO TABS
100.0000 mg | ORAL_TABLET | Freq: Every day | ORAL | Status: DC
  Administered 2012-03-14 – 2012-03-15 (×2): 100 mg via ORAL
  Filled 2012-03-14 (×3): qty 1

## 2012-03-14 MED ORDER — OLANZAPINE 5 MG PO TABS
5.0000 mg | ORAL_TABLET | Freq: Two times a day (BID) | ORAL | Status: DC | PRN
  Administered 2012-03-15: 5 mg via ORAL
  Filled 2012-03-14: qty 1

## 2012-03-14 MED ORDER — FOLIC ACID 1 MG PO TABS
1.0000 mg | ORAL_TABLET | Freq: Every day | ORAL | Status: DC
  Administered 2012-03-14 – 2012-03-15 (×2): 1 mg via ORAL
  Filled 2012-03-14 (×3): qty 1

## 2012-03-14 MED ORDER — ADULT MULTIVITAMIN W/MINERALS CH
1.0000 | ORAL_TABLET | Freq: Every day | ORAL | Status: DC
  Administered 2012-03-14 – 2012-03-15 (×2): 1 via ORAL
  Filled 2012-03-14 (×3): qty 1

## 2012-03-14 MED ORDER — LORAZEPAM 1 MG PO TABS
1.0000 mg | ORAL_TABLET | Freq: Four times a day (QID) | ORAL | Status: DC | PRN
  Filled 2012-03-14 (×2): qty 1

## 2012-03-14 MED ORDER — LORAZEPAM 1 MG PO TABS
1.0000 mg | ORAL_TABLET | ORAL | Status: DC | PRN
  Administered 2012-03-14 – 2012-03-16 (×6): 1 mg via ORAL
  Filled 2012-03-14 (×5): qty 1

## 2012-03-14 NOTE — Progress Notes (Signed)
Went in with Dr. Malachi Bonds to notify patient about his involuntary commitment. To ensure safety there were 4 public safety officers standing by in the hall. Patient was argumentative and again voiced his desire to leave. Dr. Malachi Bonds informed him that medically and per the commitment order that he needed to stay and be seen by the psychiatrist.  Patient tried to deny his suicidal ideation from the previous night.  However, he did not deny that there are people that he would like to hurt but does not want to go back to jail. Patient was visibly upset and angry. Sitter remains in room. Erskin Burnet RN

## 2012-03-14 NOTE — Progress Notes (Signed)
[facility] PROGRESS NOTE  Brandon [name] AVW:[id number] DOB: Nov 13, 1965 DOA: 03/12/2012 PCP: No primary provider on file.  Assessment/Plan   *Altered mental state, likely drug induced delirium resolving - Likely secondary to overdose on substances, including cocaine and amphetamines  - Alcohol level undetected on admission  - troponin neg x 3 - EKG NSR  Active Problems:  Suicidal ideation Patient initially endorsed suicidal ideation when he was still under the influence of drugs.  He subsequently denied SI/HI during exam the following day and was very anxious to leave.  He was seen by psychiatry who recommended outpatient follow up.  Overnight, however, he was threatening to the nursing staff and made concerning comments about his thoughts of harming himself and others.  Please defer to 1/15-1/16 nursing notes. -  Patient's sitter was reinstituted -  IVC papers signed by myself -  Psychiatry to reassess patient  - Suicide precautions  Substance abuse  - UDS positive for cocaine and amphetamine  -  Counseled abstinence  Rhabdomyolysis CPK trended down.  Creatinine stable and uop increased.  Likely secondary to cocaine use  - d/c IVF  Diet:  Regular Access:  PIV IVF:  OFF Proph:  lovenox  Code Status: full code Family Communication: spoke with patient alone Disposition Plan:  pending psychiatry reevaluation   Consultants:  Psychiatry  Procedures:  none  Antibiotics:  none   HPI/Subjective:  Patient states that he feels his thinking is clearer and he feels better.  He states he does not remember the events of overnight and states that he does want to harm other people, but "I don't want to go back to jail."  He denies suicidal ideation.  He has been intermittently agitated during the day.  Denies headache, chest pain, shorntess of breath, N/V/D/C.   Objective: Filed Vitals:   03/13/12 2146 03/14/12 0445 03/14/12 0825 03/14/12 1405  BP: 122/70 131/69 120/66  149/83  Pulse: 65 70 63 70  Temp: 97.6 F (36.4 C) 97.6 F (36.4 C) 98.2 F (36.8 C) 98 F (36.7 C)  TempSrc: Oral Oral Oral Oral  Resp: 18 16 20 18   Height:      Weight:      SpO2: 97% 98% 96% 97%    Intake/Output Summary (Last 24 hours) at 03/14/12 1836 Last data filed at 03/14/12 1737  Gross per 24 hour  Intake   5480 ml  Output   4500 ml  Net    980 ml   Filed Weights   03/12/12 1030  Weight: 113.399 kg (250 lb)    Exam:  Refused exam today.    General:  Caucasian male, no acute distress, awake and alert  HEENT:  Appears to have MMM, PERRL   Cardiovascular:  Patient refused  Respiratory:  Patient refused  Abdomen:  Patient refused  MSK:  Normal tone and bulk  Neuro:  Grossly moves all extremities  Psych:  Voiced that he was angry about needing to see psychiatry again but did not move in the bed or attempt to get out of bed.    Data Reviewed: Basic Metabolic Panel:  Lab 03/14/12 8295 03/13/12 0450 03/12/12 0850  NA 138 139 132*  K 3.6 3.4* 4.0  CL 105 107 94*  CO2 25 25 23   GLUCOSE 89 103* 62*  BUN 6 11 19   CREATININE 0.93 0.95 1.00  CALCIUM 8.1* 8.2* 9.2  MG -- -- --  PHOS -- -- --   Liver Function Tests:  Lab 03/12/12 0850  AST 93*  ALT 35  ALKPHOS 51  BILITOT 0.8  PROT 6.7  ALBUMIN 3.9   No results found for this basename: LIPASE:5,AMYLASE:5 in the last 168 hours No results found for this basename: AMMONIA:5 in the last 168 hours CBC:  Lab 03/14/12 0433 03/13/12 0450 03/12/12 0850  WBC 6.3 4.2 8.5  NEUTROABS -- -- 5.6  HGB 12.4* 12.4* 14.2  HCT 36.3* 36.7* 40.4  MCV 92.1 92.0 91.2  PLT 226 223 269   Cardiac Enzymes:  Lab 03/14/12 0433 03/13/12 1103 03/13/12 0450 03/12/12 2226 03/12/12 1626 03/12/12 1110  CKTOTAL 509* 915* 1158* 1505* 1974* --  CKMB -- 8.1* 10.4* 13.5* 18.4* 23.5*  CKMBINDEX -- -- -- -- -- --  TROPONINI -- -- -- <0.30 <0.30 <0.30   BNP (last 3 results) No results found for this basename: PROBNP:3 in the  last 8760 hours CBG:  Lab 03/14/12 0752 03/13/12 0754 03/12/12 1053  GLUCAP 89 82 112*    No results found for this or any previous visit (from the past 240 hour(s)).   Studies: No results found.  Scheduled Meds:    . enoxaparin (LOVENOX) injection  60 mg Subcutaneous Q24H  . folic acid  1 mg Oral Daily  . multivitamin with minerals  1 tablet Oral Daily  . sodium chloride  3 mL Intravenous Q12H  . thiamine  100 mg Oral Daily   Or  . thiamine  100 mg Intravenous Daily   Continuous Infusions:   Principal Problem:  *Altered mental state Active Problems:  Suicidal ideation  Substance abuse  Rhabdomyolysis  Oliguria    Time spent: 30 min    Aamira Bischoff, [facility]  [facility] Pager [phone]. If 8PM-8AM, please contact night-coverage at [email or web address], password [facility] 03/14/2012, 6:36 PM  LOS: 2 days

## 2012-03-14 NOTE — Progress Notes (Addendum)
Pt becoming increasingly agitated and banging feet on foot board on bed. Pt expressing desire to "put a bullet" in his ex-girlfriends boyfriend head. Pt would not reveal name of person he wanted to harm. Also expressing desire to "cut out peoples tongues". Pt states he is a "mean and angry" person. When asked if patient thinks of harming himself he responds "If I told you the truth I would not get to go home, but No". Therapeutic communication used and meditation used at bedside to calm patient. MD and [facility] notified, suicide/safety sitter ordered for bedside. Will continue to monitor and use therapeutic conversation with this patient.  Earnest Conroy. Clelia Croft, RN

## 2012-03-14 NOTE — Progress Notes (Signed)
Per report patient suicide precautions and sitter discontinued. MD paged to discontinue order in EPIC. Pt denies suicidal ideation.  Earnest Conroy. Clelia Croft, RN

## 2012-03-14 NOTE — Progress Notes (Signed)
Notified Magistrate of impending IVC fax.  Confirmed with Magistrate receipt of fax.  Providence Crosby, LCSWA Clinical Social Work [phone]

## 2012-03-14 NOTE — Progress Notes (Signed)
Received signed involuntary committment order papers from Emergency planning/management officer. Have placed them on the chart and notified Dr. Malachi Bonds. Erskin Burnet RN

## 2012-03-14 NOTE — Consult Note (Signed)
Reason for Consult: SI ? Referring Physician: unknown  Brandon [name] is an 47 y.o. male.  HPI:    Patient is 47 year old male with history of substance abuse, including cocaine and amphetamines, presents to [facility] emergency department with altered mental status. Seen today and chart was reviewed. patient is unable to provide detailed history is he's very agitated and does not answer questions appropriately.  Reportedly patient has had chest discomfort but the details are not clear.  In emergency department, patient found to be agitated and reported suicidal ideations. urine drug screen positive for cocaine and amphetamines, rhabdomyolysis diagnosed given elevated CK values.    Interval Hx:  More calmer now but his body is restless. Per pt he is always like that . denies any SI or HI now. wants to break up with her girl friend.  denies any SI now or before coming here.  Admits using drugs these days. He is very unreliable.  Past Medical History  Diagnosis Date  . Drug abuse   . Kidney failure   . Bipolar 1 disorder     History reviewed. No pertinent past surgical history.  Family History  Problem Relation Age of Onset  . Heart disease Mother   . Heart disease Father     Social History:  reports that he has never smoked. He has never used smokeless tobacco. He reports that he drinks alcohol. He reports that he uses illicit drugs (Cocaine and Methamphetamines).  Allergies: No Known Allergies  Medications: I have reviewed the patient's current medications.  Results for orders placed during the hospital encounter of 03/12/12 (from the past 48 hour(s))  TROPONIN I     Status: Normal   Collection Time   03/12/12  4:26 PM      Component Value Range Comment   Troponin I <0.30  <0.30 ng/mL   CK TOTAL AND CKMB     Status: Abnormal   Collection Time   03/12/12  4:26 PM      Component Value Range Comment   Total CK 1974 (*) 7 - 232 U/L    CK, MB 18.4 (*) 0.3 - 4.0 ng/mL CRITICAL  VALUE NOTED.  VALUE IS CONSISTENT WITH PREVIOUSLY REPORTED AND CALLED VALUE.   Relative Index 0.9  0.0 - 2.5   TROPONIN I     Status: Normal   Collection Time   03/12/12 10:26 PM      Component Value Range Comment   Troponin I <0.30  <0.30 ng/mL   CK TOTAL AND CKMB     Status: Abnormal   Collection Time   03/12/12 10:26 PM      Component Value Range Comment   Total CK 1505 (*) 7 - 232 U/L    CK, MB 13.5 (*) 0.3 - 4.0 ng/mL    Relative Index 0.9  0.0 - 2.5   CK TOTAL AND CKMB     Status: Abnormal   Collection Time   03/13/12  4:50 AM      Component Value Range Comment   Total CK 1158 (*) 7 - 232 U/L    CK, MB 10.4 (*) 0.3 - 4.0 ng/mL CRITICAL VALUE NOTED.  VALUE IS CONSISTENT WITH PREVIOUSLY REPORTED AND CALLED VALUE.   Relative Index 0.9  0.0 - 2.5   BASIC METABOLIC PANEL     Status: Abnormal   Collection Time   03/13/12  4:50 AM      Component Value Range Comment   Sodium 139  135 - 145  mEq/L    Potassium 3.4 (*) 3.5 - 5.1 mEq/L    Chloride 107  96 - 112 mEq/L    CO2 25  19 - 32 mEq/L    Glucose, Bld 103 (*) 70 - 99 mg/dL    BUN 11  6 - 23 mg/dL    Creatinine, Ser 8.29  0.50 - 1.35 mg/dL    Calcium 8.2 (*) 8.4 - 10.5 mg/dL    GFR calc non Af Amer >90  >90 mL/min    GFR calc Af Amer >90  >90 mL/min   CBC     Status: Abnormal   Collection Time   03/13/12  4:50 AM      Component Value Range Comment   WBC 4.2  4.0 - 10.5 K/uL    RBC 3.99 (*) 4.22 - 5.81 MIL/uL    Hemoglobin 12.4 (*) 13.0 - 17.0 g/dL    HCT 56.2 (*) 13.0 - 52.0 %    MCV 92.0  78.0 - 100.0 fL    MCH 31.1  26.0 - 34.0 pg    MCHC 33.8  30.0 - 36.0 g/dL    RDW 86.5  78.4 - 69.6 %    Platelets 223  150 - 400 K/uL   GLUCOSE, CAPILLARY     Status: Normal   Collection Time   03/13/12  7:54 AM      Component Value Range Comment   Glucose-Capillary 82  70 - 99 mg/dL   CK TOTAL AND CKMB     Status: Abnormal   Collection Time   03/13/12 11:03 AM      Component Value Range Comment   Total CK 915 (*) 7 - 232 U/L     CK, MB 8.1 (*) 0.3 - 4.0 ng/mL CRITICAL VALUE NOTED.  VALUE IS CONSISTENT WITH PREVIOUSLY REPORTED AND CALLED VALUE.   Relative Index 0.9  0.0 - 2.5   CBC     Status: Abnormal   Collection Time   03/14/12  4:33 AM      Component Value Range Comment   WBC 6.3  4.0 - 10.5 K/uL    RBC 3.94 (*) 4.22 - 5.81 MIL/uL    Hemoglobin 12.4 (*) 13.0 - 17.0 g/dL    HCT 29.5 (*) 28.4 - 52.0 %    MCV 92.1  78.0 - 100.0 fL    MCH 31.5  26.0 - 34.0 pg    MCHC 34.2  30.0 - 36.0 g/dL    RDW 13.2  44.0 - 10.2 %    Platelets 226  150 - 400 K/uL   BASIC METABOLIC PANEL     Status: Abnormal   Collection Time   03/14/12  4:33 AM      Component Value Range Comment   Sodium 138  135 - 145 mEq/L    Potassium 3.6  3.5 - 5.1 mEq/L    Chloride 105  96 - 112 mEq/L    CO2 25  19 - 32 mEq/L    Glucose, Bld 89  70 - 99 mg/dL    BUN 6  6 - 23 mg/dL    Creatinine, Ser 7.25  0.50 - 1.35 mg/dL    Calcium 8.1 (*) 8.4 - 10.5 mg/dL    GFR calc non Af Amer >90  >90 mL/min    GFR calc Af Amer >90  >90 mL/min   CK     Status: Abnormal   Collection Time   03/14/12  4:33 AM  Component Value Range Comment   Total CK 509 (*) 7 - 232 U/L   GLUCOSE, CAPILLARY     Status: Normal   Collection Time   03/14/12  7:52 AM      Component Value Range Comment   Glucose-Capillary 89  70 - 99 mg/dL    Comment 1 Notify RN      Comment 2 Documented in Chart       No results found.  Review of Systems  Neurological: Positive for tremors.   Blood pressure 120/66, pulse 63, temperature 98.2 F (36.8 C), temperature source Oral, resp. rate 20, height 6\' 3"  (1.905 m), weight 113.399 kg (250 lb), SpO2 96.00%. Physical Exam    Mental Status Examination/Evaluation:   Appearance: on bed confused   Eye Contact::  none   Speech: limited   Volume: low   Mood: no answer   Affect: ristricted   Thought Process: disorganized at times   Orientation: 44   Thought Content: denies AVH   Suicidal Thoughts: none  now   Homicidal Thoughts: no   Memory: Recent; Poor   Judgement: Impaired   Insight: Lacking   Psychomotor Activity: slow   Concentration: poor   Recall: poor   Akathisia: No   Assessment:   AXIS I: Delirium  NOS (resolving), polysubstance dep AXIS II: Deferred   AXIS III: see emdical hx ?     AXIS IV: unknown   AXIS V: 20   ?   Treatment Plan/Recommendations:   - Likley delirium at this time and it is resolving.   - I agree with IVC based on hx we have.  Pt is very unreliable and angry. He is talking irrational at times.   - will consider transfer to [facility] after medical clearance for safety and further evaluation    - use Zyprexa 5-10 mg q12 hour prn for agitation if needed   - will follow as needed  Wonda Cerise 03/14/2012, 12:01 PM

## 2012-03-14 NOTE — Progress Notes (Signed)
Patient is becoming more and more restless. Patient is saying that he is not staying here past 1600, that he will leave and that he must get to his job. Patient is very argumentative and not willing to listen. Patient's parents called and patient gave permission to speak to parents on my phone and when they called back on his phone. Patient's parents were concerned that the patient thought he was leaving today.  Dr. Malachi Bonds notified. Sitter still by bedside closely monitoring the patient. Erskin Burnet RN

## 2012-03-15 DIAGNOSIS — D649 Anemia, unspecified: Secondary | ICD-10-CM

## 2012-03-15 DIAGNOSIS — F22 Delusional disorders: Secondary | ICD-10-CM

## 2012-03-15 MED ORDER — OLANZAPINE 10 MG PO TABS
10.0000 mg | ORAL_TABLET | Freq: Two times a day (BID) | ORAL | Status: DC | PRN
  Filled 2012-03-15: qty 1

## 2012-03-15 MED ORDER — ZOLPIDEM TARTRATE 5 MG PO TABS
5.0000 mg | ORAL_TABLET | Freq: Every evening | ORAL | Status: DC | PRN
  Administered 2012-03-15: 5 mg via ORAL
  Filled 2012-03-15: qty 1

## 2012-03-15 MED ORDER — ZOLPIDEM TARTRATE 5 MG PO TABS
5.0000 mg | ORAL_TABLET | Freq: Once | ORAL | Status: AC
  Administered 2012-03-15: 5 mg via ORAL
  Filled 2012-03-15: qty 1

## 2012-03-15 NOTE — Progress Notes (Signed)
Per MD, Pt medically ready for d/c.  Notified [facility].  [facility] to review for possible admission.  Providence Crosby, LCSWA Clinical Social Work [phone]

## 2012-03-15 NOTE — Progress Notes (Signed)
Patient starting get agitated and angry wanting to go home, grinding teeth and fidgety. Asking for ativan and pain medicine. PRN meds given which helped patient to calm down .

## 2012-03-15 NOTE — Discharge Summary (Signed)
Physician Discharge Summary  Brandon [name] RUE:[id number] DOB: 11/19/1965 DOA: 03/12/2012  PCP: No primary provider on file.  Admit date: 03/12/2012 AMA date: 03/16/2012    Recommendations for Outpatient Follow-up:  1. To [facility] for ongoing treatment for suicidal and homicidal ideation  Discharge Diagnoses:  Active Problems:  Suicidal ideation  Substance abuse  Normocytic anemia  Mood disorder   Discharge Condition:   Stable, improved  Diet recommendation: healthy heart  Wt Readings from Last 3 Encounters:  03/12/12 113.399 kg (250 lb)    History of present illness:   Patient is 47 year old male with history of substance abuse, including cocaine and amphetamines, presents to [facility] emergency department with altered mental status. Please note that patient is unable to provide detailed history is he's very agitated and does not answer questions appropriately. Most of the history is obtained from available records and emergency department physician. Reportedly patient has had chest discomfort but the details are not clear.  In emergency department, patient found to be agitated and suicidal, urine drug screen positive for cocaine and amphetamines, rhabdomyolysis diagnosed given elevated CK values. Patient given 1 L bolus of normal saline.    Hospital Course:   Altered mental state, likely drug induced delirium resolved.  Was likely secondary to overdose of cocaine and amphetamines. Alcohol level was undetected on admission. Troponins neg x 3 and ECG was NSR. He did not have any metabolic derangements to explain his altered mentation and it improved within 24 to 48 hours. He has agitation which may have been a sign of alcohol withdrawal which responded to ativan as needed and he did have some restlessness which improved during hospitalization, however he did not have tongue fasciculation, tachycardia, or tremors.  Continue ativan as needed for anxiety as well as thiamine and folate.     Suicidal ideation Patient initially endorsed suicidal ideation and attempt when he was still under the influence of drugs.  He subsequently denied SI/HI during exam the following day and was very anxious to leave. He was seen by psychiatry who recommended outpatient follow up. Overnight from 1/15 to 1/16, however, he was threatening to the nursing staff and made concerning comments about his thoughts of harming himself and others. His sitter and suicide precautions were reinstated and he was involuntarily committed.  Psychiatry reassessed the patient and recommended admission to behavioral health.  He was previously on geodon 60mg  BID which was held during his acute delirious state and not restarted by psychiatry as an inpatient.  He was instead started on zyprexa as needed.  Will defer further management to inpatient psychiatry.    Substance abuse UDS positive for cocaine and amphetamine.  Counseled abstinence.  Rhabdomyolysis CPK was 1505 and trended down with hydration.  On the first day of admission he had minimal urine output but by the second date of admission, he made almost 2L of urine.  His creatinine reamined stable.  He was likely somewhat dehydrated prior to admission.    Mild normocytic anemia:  May have been due to recent illness.  Repeat by PCP within 2 weeks as outpatient and further evaluation of anemia if indicated.     Procedures:  None  Consultations:  Psychiatry  Discharge Exam: Filed Vitals:   03/16/12 1402  BP: 116/76  Pulse: 66  Temp: 97.9 F (36.6 C)  Resp: 18   Filed Vitals:   03/15/12 1705 03/16/12 0000 03/16/12 0556 03/16/12 1402  BP: 119/73 116/74 130/85 116/76  Pulse: 60 56 58 66  Temp: 97.3 F (36.3 C) 97.5 F (36.4 C)  97.9 F (36.6 C)  TempSrc: Oral Oral  Oral  Resp: 20 16 16 18   Height:      Weight:      SpO2: 92% 94% 95% 97%   Patient voices concerns that he is not being seen by the doctors, however, he has been seen daily by myself and  psychiatry.  Denies SOB, CP, N/V/D/C, dysuria.  He has been eating and voiding normally.    General: Caucasian male, no acute distress, lying in bed.  Cooperative with exam HEENT: NCAT, MMM  Cardiovascular: RRR, no murmurs, rubs, or gallops, 2+ pulses  Respiratory: CTAB, no increased WOB  Abdomen: NABS, soft, nondistended, nontender, no organomegaly  MSK: Normal tone and bulk  Neuro: Grossly intact, moves all extremities.    Discharge Instructions      Discharge Orders    Future Orders Please Complete By Expires   Diet general      Increase activity slowly      Discharge instructions      Comments:   You were hospitalized with drug overdose and muscle damage from dehydration and drug use.  You are being transferred to inpatient psychiatry/behavioral health for ongoing treatment.   Call MD for:  temperature >100.4      Call MD for:  persistant nausea and vomiting      Call MD for:  severe uncontrolled pain      Call MD for:  difficulty breathing, headache or visual disturbances      Call MD for:  hives      Call MD for:  extreme fatigue      Call MD for:  persistant dizziness or light-headedness          Medication List     As of 03/16/2012  2:24 PM    STOP taking these medications         ziprasidone 60 MG capsule   Commonly known as: GEODON      TAKE these medications         folic acid 1 MG tablet   Commonly known as: FOLVITE   Take 1 tablet (1 mg total) by mouth daily.      HYDROcodone-acetaminophen 5-325 MG per tablet   Commonly known as: NORCO/VICODIN   Take 1-2 tablets by mouth every 6 (six) hours as needed.      LORazepam 1 MG tablet   Commonly known as: ATIVAN   Take 1 tablet (1 mg total) by mouth every 4 (four) hours as needed for anxiety.      multivitamin with minerals Tabs   Take 1 tablet by mouth daily.      OLANZapine 10 MG tablet   Commonly known as: ZYPREXA   Take 1 tablet (10 mg total) by mouth 2 (two) times daily as needed (agitation).        thiamine 100 MG tablet   Take 1 tablet (100 mg total) by mouth daily.         Follow-up Information    Follow up with primary care doctor . In 1 week.          The results of significant diagnostics from this hospitalization (including imaging, microbiology, ancillary and laboratory) are listed below for reference.    Significant Diagnostic Studies: No results found.  Microbiology: No results found for this or any previous visit (from the past 240 hour(s)).   Labs: Basic Metabolic Panel:  Lab 03/14/12 1914 03/13/12 0450  03/12/12 0850  NA 138 139 132*  K 3.6 3.4* 4.0  CL 105 107 94*  CO2 25 25 23   GLUCOSE 89 103* 62*  BUN 6 11 19   CREATININE 0.93 0.95 1.00  CALCIUM 8.1* 8.2* 9.2  MG -- -- --  PHOS -- -- --   Liver Function Tests:  Lab 03/12/12 0850  AST 93*  ALT 35  ALKPHOS 51  BILITOT 0.8  PROT 6.7  ALBUMIN 3.9   No results found for this basename: LIPASE:5,AMYLASE:5 in the last 168 hours No results found for this basename: AMMONIA:5 in the last 168 hours CBC:  Lab 03/14/12 0433 03/13/12 0450 03/12/12 0850  WBC 6.3 4.2 8.5  NEUTROABS -- -- 5.6  HGB 12.4* 12.4* 14.2  HCT 36.3* 36.7* 40.4  MCV 92.1 92.0 91.2  PLT 226 223 269   Cardiac Enzymes:  Lab 03/14/12 0433 03/13/12 1103 03/13/12 0450 03/12/12 2226 03/12/12 1626 03/12/12 1110  CKTOTAL 509* 915* 1158* 1505* 1974* --  CKMB -- 8.1* 10.4* 13.5* 18.4* 23.5*  CKMBINDEX -- -- -- -- -- --  TROPONINI -- -- -- <0.30 <0.30 <0.30   BNP: BNP (last 3 results) No results found for this basename: PROBNP:3 in the last 8760 hours CBG:  Lab 03/14/12 0752 03/13/12 0754 03/12/12 1053  GLUCAP 89 82 112*    Time coordinating discharge: 30 minutes  Signed:  Vercie Pokorny  [facility] 03/16/2012, 2:24 PM

## 2012-03-15 NOTE — Progress Notes (Signed)
Received report from RN, pt arrived the unit, alert but sleepy. Will continue to monitor.

## 2012-03-15 NOTE — Progress Notes (Signed)
Per [facility], Pt needs to be seen by psych MD to firm up d/c recommendations.  [facility] has notified psych MD.  Weekend CSW to follow and assist with d/c plans.  Providence Crosby, LCSWA Clinical Social Work [phone]

## 2012-03-15 NOTE — Consult Note (Signed)
Reason for Consult: SI ? Referring Physician: unknown  Brandon [name] is an 47 y.o. male.  HPI:    Patient is 47 year old male with history of substance abuse, including cocaine and amphetamines, presents to [facility] emergency department with altered mental status. Seen today and chart was reviewed. patient is unable to provide detailed history is he's very agitated and does not answer questions appropriately.  Reportedly patient has had chest discomfort but the details are not clear.  In emergency department, patient found to be agitated and reported suicidal ideations. urine drug screen positive for cocaine and amphetamines, rhabdomyolysis diagnosed given elevated CK values.    Interval Hx:  More calmer today but his body is less restless now.  Continues to make no eye contact while talking. Minimize her stressors including break up with her girl friend . denies any SI or HI now or after admission but per chart pt had just few days ago. wants to break up with her girl friend.    Admits using drugs these days. He continues to be very unreliable.  Past Medical History  Diagnosis Date  . Drug abuse   . Kidney failure   . Bipolar 1 disorder     History reviewed. No pertinent past surgical history.  Family History  Problem Relation Age of Onset  . Heart disease Mother   . Heart disease Father     Social History:  reports that he has never smoked. He has never used smokeless tobacco. He reports that he drinks alcohol. He reports that he uses illicit drugs (Cocaine and Methamphetamines).  Allergies: No Known Allergies  Medications: I have reviewed the patient's current medications.  Results for orders placed during the hospital encounter of 03/12/12 (from the past 48 hour(s))  CBC     Status: Abnormal   Collection Time   03/14/12  4:33 AM      Component Value Range Comment   WBC 6.3  4.0 - 10.5 K/uL    RBC 3.94 (*) 4.22 - 5.81 MIL/uL    Hemoglobin 12.4 (*) 13.0 - 17.0 g/dL    HCT 16.1 (*) 09.6 - 52.0 %    MCV 92.1  78.0 - 100.0 fL    MCH 31.5  26.0 - 34.0 pg    MCHC 34.2  30.0 - 36.0 g/dL    RDW 04.5  40.9 - 81.1 %    Platelets 226  150 - 400 K/uL   BASIC METABOLIC PANEL     Status: Abnormal   Collection Time   03/14/12  4:33 AM      Component Value Range Comment   Sodium 138  135 - 145 mEq/L    Potassium 3.6  3.5 - 5.1 mEq/L    Chloride 105  96 - 112 mEq/L    CO2 25  19 - 32 mEq/L    Glucose, Bld 89  70 - 99 mg/dL    BUN 6  6 - 23 mg/dL    Creatinine, Ser 9.14  0.50 - 1.35 mg/dL    Calcium 8.1 (*) 8.4 - 10.5 mg/dL    GFR calc non Af Amer >90  >90 mL/min    GFR calc Af Amer >90  >90 mL/min   CK     Status: Abnormal   Collection Time   03/14/12  4:33 AM      Component Value Range Comment   Total CK 509 (*) 7 - 232 U/L   GLUCOSE, CAPILLARY     Status: Normal  Collection Time   03/14/12  7:52 AM      Component Value Range Comment   Glucose-Capillary 89  70 - 99 mg/dL    Comment 1 Notify RN      Comment 2 Documented in Chart       No results found.  Review of Systems  Neurological: Positive for tremors.   Blood pressure 119/73, pulse 60, temperature 97.3 F (36.3 C), temperature source Oral, resp. rate 20, height 6\' 3"  (1.905 m), weight 113.399 kg (250 lb), SpO2 92.00%. Physical Exam    Mental Status Examination/Evaluation:   Appearance: on bed confused at times and sleepy   Eye Contact::  none   Speech: limited   Volume: low   Mood: fine   Affect: ristricted   Thought Process: disorganized at times   Orientation: 4/4   Thought Content: denies AVH   Suicidal Thoughts: none now   Homicidal Thoughts: no   Memory: Recent; Poor   Judgement: Impaired   Insight: Lacking   Psychomotor Activity: slow   Concentration: poor   Recall: poor   Akathisia: No   Assessment:   AXIS I: Delirium  NOS (resolving), polysubstance dep AXIS II: Deferred   AXIS III: see emdical hx ?     AXIS IV:  unknown   AXIS V: 20   ?   Treatment Plan/Recommendations:   -  Likely delirium and it is resolving. ETOH withdrawal may be contributing to his AMS    - pt is under IVC now.  Pt expressed SI and HI after admission and now denies everything. Pt is very unreliable and angry. He is talking irrational at times. I will recommend inpt Psy after medical clearance at this time for safety and further evaluation.   - use Zyprexa 5-10 mg q12 hour prn for agitation if needed   - will follow as needed  Wonda Cerise 03/15/2012, 8:01 PM

## 2012-03-15 NOTE — Progress Notes (Signed)
Transferred to Room 1505 report given to [facility]. Patient wheeled with 2 Techs assisting.

## 2012-03-15 NOTE — Progress Notes (Addendum)
[facility] PROGRESS NOTE  Detroit Frieden ZOX:[id number] DOB: Oct 15, 1965 DOA: 03/12/2012 PCP: No primary provider on file.  Assessment/Plan   Altered mental state, likely drug induced delirium resolved. Was likely secondary to overdose of cocaine and amphetamines. Alcohol level was undetected on admission. Troponins neg x 3 and ECG was NSR. He did not have any metabolic derangements to explain his altered mentation and it improved within 24 to 48 hours.  He has agitation which may have been a sign of alcohol withdrawal which responded to ativan as needed and he did have some restlessness which improved during hospitalization, however he did not have tongue fasciculation, tachycardia, or tremors. -  Continue ativan as needed for anxiety -  Continue MVI, thiamine, folate  Suicidal ideation Patient initially endorsed suicidal ideation and attempt when he was still under the influence of drugs. He subsequently denied SI/HI during exam the following day and was very anxious to leave. He was seen by psychiatry who recommended outpatient follow up. Overnight from 1/15 to 1/16, however, he was threatening to the nursing staff and made concerning comments about his thoughts of harming himself and others. His sitter and suicide precautions were reinstated and he was involuntarily committed. Psychiatry reassessed the patient and recommended behavioral health when medically stable.   Substance abuse UDS positive for cocaine and amphetamine. Counseled abstinence.   Rhabdomyolysis CPK was 1505 and trended down with hydration. On the first day of admission he had minimal urine output but by the second date of admission, he made almost 2L of urine. His creatinine reamined stable. He was likely somewhat dehydrated prior to admission.   Mild normocytic anemia:  May have been due to recent illness. -  Repeat by PCP in 1 month as outpatient  Diet:  Regular Access:  PIV IVF:  OFF Proph:  lovenox  Code Status:  full code Family Communication: spoke with patient alone Disposition Plan:  Patient medically stable for transfer to [facility]   Consultants:  Psychiatry  Procedures:  none  Antibiotics:  none   HPI/Subjective:  Denies headache, chest pain, shortness of breath, N/V/D/C.  Voiding well even after IVF stopped yesterday  Objective: Filed Vitals:   03/14/12 1405 03/15/12 0010 03/15/12 0505 03/15/12 1359  BP: 149/83 105/55 102/65 127/81  Pulse: 70 65 68 66  Temp: 98 F (36.7 C) 98.1 F (36.7 C) 97.5 F (36.4 C) 98 F (36.7 C)  TempSrc: Oral Oral Oral Oral  Resp: 18 18 18 18   Height:      Weight:      SpO2: 97% 99% 96% 97%    Intake/Output Summary (Last 24 hours) at 03/15/12 1419 Last data filed at 03/15/12 1318  Gross per 24 hour  Intake   2300 ml  Output    702 ml  Net   1598 ml   Filed Weights   03/12/12 1030  Weight: 113.399 kg (250 lb)    Exam:  General: Caucasian male, no acute distress, lying in bed, less agitated and restless today. Cooperative with exam  HEENT: NCAT, MMM  Cardiovascular: RRR, no murmurs, rubs, or gallops, 2+ pulses  Respiratory: CTAB, no increased WOB  Abdomen: NABS, soft, nondistended, nontender, no organomegaly  MSK: Normal tone and bulk  Neuro: Grossly intact, moves all extremities   Data Reviewed: Basic Metabolic Panel:  Lab 03/14/12 8119 03/13/12 0450 03/12/12 0850  NA 138 139 132*  K 3.6 3.4* 4.0  CL 105 107 94*  CO2 25 25 23   GLUCOSE 89 103* 62*  BUN 6 11 19   CREATININE 0.93 0.95 1.00  CALCIUM 8.1* 8.2* 9.2  MG -- -- --  PHOS -- -- --   Liver Function Tests:  Lab 03/12/12 0850  AST 93*  ALT 35  ALKPHOS 51  BILITOT 0.8  PROT 6.7  ALBUMIN 3.9   No results found for this basename: LIPASE:5,AMYLASE:5 in the last 168 hours No results found for this basename: AMMONIA:5 in the last 168 hours CBC:  Lab 03/14/12 0433 03/13/12 0450 03/12/12 0850  WBC 6.3 4.2 8.5  NEUTROABS -- -- 5.6  HGB 12.4* 12.4* 14.2  HCT 36.3*  36.7* 40.4  MCV 92.1 92.0 91.2  PLT 226 223 269   Cardiac Enzymes:  Lab 03/14/12 0433 03/13/12 1103 03/13/12 0450 03/12/12 2226 03/12/12 1626 03/12/12 1110  CKTOTAL 509* 915* 1158* 1505* 1974* --  CKMB -- 8.1* 10.4* 13.5* 18.4* 23.5*  CKMBINDEX -- -- -- -- -- --  TROPONINI -- -- -- <0.30 <0.30 <0.30   BNP (last 3 results) No results found for this basename: PROBNP:3 in the last 8760 hours CBG:  Lab 03/14/12 0752 03/13/12 0754 03/12/12 1053  GLUCAP 89 82 112*    No results found for this or any previous visit (from the past 240 hour(s)).   Studies: No results found.  Scheduled Meds:    . enoxaparin (LOVENOX) injection  60 mg Subcutaneous Q24H  . folic acid  1 mg Oral Daily  . multivitamin with minerals  1 tablet Oral Daily  . sodium chloride  3 mL Intravenous Q12H  . thiamine  100 mg Oral Daily   Or  . thiamine  100 mg Intravenous Daily   Continuous Infusions:   Principal Problem:  *Altered mental state Active Problems:  Suicidal ideation  Substance abuse  Rhabdomyolysis  Oliguria    Time spent: 30 min    Kerilyn Cortner, [facility]  [facility] Pager [phone]. If 8PM-8AM, please contact night-coverage at [email or web address], password [facility] 03/15/2012, 2:19 PM  LOS: 3 days

## 2012-03-16 ENCOUNTER — Encounter (HOSPITAL_COMMUNITY): Payer: Self-pay | Admitting: *Deleted

## 2012-03-16 ENCOUNTER — Inpatient Hospital Stay (HOSPITAL_COMMUNITY)
Admission: AD | Admit: 2012-03-16 | Discharge: 2012-03-18 | DRG: 897 | Disposition: A | Discharge status: Home / Self Care | Payer: Federal, State, Local not specified - Other | Attending: Psychiatry | Admitting: Psychiatry

## 2012-03-16 ENCOUNTER — Telehealth (HOSPITAL_COMMUNITY): Payer: Self-pay | Admitting: Licensed Clinical Social Worker

## 2012-03-16 DIAGNOSIS — F39 Unspecified mood [affective] disorder: Secondary | ICD-10-CM

## 2012-03-16 DIAGNOSIS — F319 Bipolar disorder, unspecified: Secondary | ICD-10-CM | POA: Diagnosis present

## 2012-03-16 DIAGNOSIS — F1410 Cocaine abuse, uncomplicated: Principal | ICD-10-CM | POA: Diagnosis present

## 2012-03-16 DIAGNOSIS — R45851 Suicidal ideations: Secondary | ICD-10-CM

## 2012-03-16 DIAGNOSIS — Z79899 Other long term (current) drug therapy: Secondary | ICD-10-CM

## 2012-03-16 HISTORY — DX: Unspecified mood (affective) disorder: F39

## 2012-03-16 MED ORDER — FOLIC ACID 1 MG PO TABS: 1.0000 mg | ORAL_TABLET | Freq: Every day | ORAL | Status: DC

## 2012-03-16 MED ORDER — ALUM & MAG HYDROXIDE-SIMETH 200-200-20 MG/5ML PO SUSP: 30.0000 mL | ORAL | Status: DC | PRN

## 2012-03-16 MED ORDER — TRAZODONE HCL 50 MG PO TABS
50.0000 mg | ORAL_TABLET | Freq: Every evening | ORAL | Status: DC | PRN
  Administered 2012-03-16 – 2012-03-17 (×3): 50 mg via ORAL
  Filled 2012-03-16: qty 8
  Filled 2012-03-16 (×3): qty 1

## 2012-03-16 MED ORDER — ADULT MULTIVITAMIN W/MINERALS CH: 1.0000 | ORAL_TABLET | Freq: Every day | ORAL | Status: DC

## 2012-03-16 MED ORDER — ZIPRASIDONE HCL 20 MG PO CAPS
20.0000 mg | ORAL_CAPSULE | Freq: Once | ORAL | Status: AC
  Administered 2012-03-16: 20 mg via ORAL
  Filled 2012-03-16: qty 1

## 2012-03-16 MED ORDER — ZIPRASIDONE HCL 20 MG PO CAPS
ORAL_CAPSULE | ORAL | Status: AC
  Filled 2012-03-16: qty 1

## 2012-03-16 MED ORDER — OLANZAPINE 10 MG PO TABS
10.0000 mg | ORAL_TABLET | Freq: Two times a day (BID) | ORAL | Status: DC | PRN
  Administered 2012-03-16 – 2012-03-17 (×3): 10 mg via ORAL
  Filled 2012-03-16 (×4): qty 1

## 2012-03-16 MED ORDER — LORAZEPAM 1 MG PO TABS: 1.0000 mg | ORAL_TABLET | ORAL | Status: DC | PRN

## 2012-03-16 MED ORDER — THIAMINE HCL 100 MG PO TABS: 100.0000 mg | ORAL_TABLET | Freq: Every day | ORAL | Status: DC

## 2012-03-16 MED ORDER — ACETAMINOPHEN 325 MG PO TABS: 650.0000 mg | ORAL_TABLET | Freq: Four times a day (QID) | ORAL | Status: DC | PRN

## 2012-03-16 MED ORDER — HYDROCODONE-ACETAMINOPHEN 5-325 MG PO TABS: 1.0000 | ORAL_TABLET | Freq: Four times a day (QID) | ORAL | Status: DC | PRN

## 2012-03-16 MED ORDER — MAGNESIUM HYDROXIDE 400 MG/5ML PO SUSP: 30.0000 mL | Freq: Every day | ORAL | Status: DC | PRN

## 2012-03-16 MED ORDER — OLANZAPINE 10 MG PO TABS: 10.0000 mg | ORAL_TABLET | Freq: Two times a day (BID) | ORAL | Status: DC | PRN

## 2012-03-16 NOTE — Consult Note (Signed)
Reason for Consult:Agitation Referring Physician: Short  Brandon [name] is an 47 y.o. male.  HPI: 47 Y/o male who was taken to the ED under the influence of cocaine, amphetamines. He was agitated and seemed to have threatened. He is clear now. He was involuntarily committed. He wants to go home. He states he has been inpatient before, that it does not help, and he does not need it. Says he takes his Geodon every day and that he usually does well. States he has a job, goes to school, takes care of his elderly parents. Wants to be discharged, afraid he is going to lose his job, and mess up school  Past Medical History  Diagnosis Date  . Drug abuse   . Kidney failure   . Bipolar 1 disorder     History reviewed. No pertinent past surgical history.  Family History  Problem Relation Age of Onset  . Heart disease Mother   . Heart disease Father     Social History:  reports that he has never smoked. He has never used smokeless tobacco. He reports that he drinks alcohol. He reports that he uses illicit drugs (Cocaine and Methamphetamines).  Allergies: No Known Allergies  Medications: I have reviewed the patient's current medications.  No results found for this or any previous visit (from the past 48 hour(s)).  No results found.  ROS Blood pressure 130/85, pulse 58, temperature 97.5 F (36.4 C), temperature source Oral, resp. rate 16, height 6\' 3"  (1.905 m), weight 113.399 kg (250 lb), SpO2 95.00%. Physical Exam  Psychiatric: Thought content normal. His mood appears anxious. His affect is angry. His speech is rapid and/or pressured. He is agitated. Cognition and memory are normal. He expresses impulsivity.       Wants to be discharge. States he was upset with the brake up. He admits that he was intoxicated. He does not even remember what he said    Assessment/Plan: Admit to [facility] for further observation and stabilization  Phylliss Strege A 03/16/2012, 1:46 PM
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
## 2012-03-16 NOTE — ED Notes (Signed)
Pts chart opened to try and locate personal belongings

## 2012-03-16 NOTE — Progress Notes (Signed)
Patient has been discharge to [facility] per md order. It was documented by the admission nurse that the patient has two white patient belonging bags and one black bookbag. The patient states in those bags were his clothing, school books, shoes, etc. At this time we have been unable to locate his belongings. I have physically searched [address] east. Along with the [facility] we went to the 4th floor and looked with the charge nurse Kendal Hymen, RN. The patient was in rm 1427 prior to coming to 1505. Belongings could not be found. Security was called and they could not find any belongings for this patient. ED could not find his bags either. Risk management has been called and voice message was left in reference to the missing belonging. K. Totten, Water quality scientist will be notified via email today. We will continue to look for these items and will send them over to [facility] if they are found.
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
## 2012-03-16 NOTE — Progress Notes (Signed)
Pt was very agitated and stated that he wanted to leave AMA unless a doctor was coming soon. I told him that due to him being IVC'd he wold not be able to leave until he is cleared by the doctor per court orders and he agreed to stay and comply with the what was stated above.   Medication was given for anxiety and agitation.  MCCLAIN, Alley Neils L
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
## 2012-03-16 NOTE — Progress Notes (Addendum)
Nursing admission note-  Patient is a 46y/o w/m admitted IVC following medical clearance from [facility] medical inpatient.  Presents to [facility] agitated and irritable stating he is not suicidal or homicidal and did not report that initially in ED. Stressors include breakup with GF as well as school and job conflicts.  This sent him into a "3 day" cocaine binge. Also reports being "off" Geodon x5 days. No other medical problems,however, [facility] has documented a  r/o rhabdomylosis secondary to elevated CK levels.  Brandon [name] is anxious and wanting discharge. Speech is pressured and he clinches his jaw frequently during assessment.  States he just wants to eat and go to sleep.  Denies SI/HI and contracts for safety. Reports "several" rehabs and minimizes SA impact on current admission. Rates back pain at 8 but refuses prn medication as well as heat/cold therapy. Belongings checked and oriented to unit. 15' checks and POC initiated. Remains safe.

## 2012-03-17 DIAGNOSIS — F319 Bipolar disorder, unspecified: Secondary | ICD-10-CM | POA: Diagnosis present

## 2012-03-17 DIAGNOSIS — F1410 Cocaine abuse, uncomplicated: Principal | ICD-10-CM

## 2012-03-17 HISTORY — DX: Bipolar disorder, unspecified: F31.9

## 2012-03-17 MED ORDER — ZIPRASIDONE HCL 20 MG PO CAPS
20.0000 mg | ORAL_CAPSULE | Freq: Two times a day (BID) | ORAL | Status: DC
  Administered 2012-03-17 – 2012-03-18 (×3): 20 mg via ORAL
  Filled 2012-03-17 (×5): qty 1

## 2012-03-17 MED ORDER — FOLIC ACID 1 MG PO TABS
1.0000 mg | ORAL_TABLET | Freq: Every day | ORAL | Status: DC
  Administered 2012-03-17 – 2012-03-18 (×2): 1 mg via ORAL
  Filled 2012-03-17 (×3): qty 1

## 2012-03-17 MED ORDER — ADULT MULTIVITAMIN W/MINERALS CH
1.0000 | ORAL_TABLET | Freq: Every day | ORAL | Status: DC
  Administered 2012-03-17 – 2012-03-18 (×2): 1 via ORAL
  Filled 2012-03-17 (×3): qty 1

## 2012-03-17 NOTE — Progress Notes (Signed)
D.  Pt still stating he needs his Geodon to be 120 mg, states he will speak to doctor in AM about this.  Has spent a good amount of day in bed.  Positive for evening AA group.  Interacting appropriately on unit.  Denies SI/HI/hallucinations at this time.  A.  Support and encouragement offered, medications given as ordered.  R.  No acute distress noted, will continue to monitor.

## 2012-03-17 NOTE — Progress Notes (Signed)
Patient did not attend the evening speaker AA meeting. Pt was a new admit and slept during meeting.

## 2012-03-17 NOTE — H&P (Signed)
  Pt was seen by me today and I agree with the key elements documented in H&P. Please see my SRA note for additional plans.

## 2012-03-17 NOTE — Progress Notes (Signed)
Brandon [name] has spent most of his day...sleeping . HE got up at lunch and dinner, came to the med window for his meds, then returned to sleep. HE demonstrates tardive mannerisms with his mouth. HE is flat, disconnected and has disjointed speech.   A He requests that he be given 120 mg geodon ( " like I take at home" ) and he says he will speak with the doctor about it.

## 2012-03-17 NOTE — Progress Notes (Signed)
Psychoeducational Group Note  Date: 03/17/2012  Time: 0930  Group Topic/Focus:  Gratefulness: The focus of this group is to help patients identify what two things they are most grateful for in their lives. What helps ground them and to center them on their work to their recovery.  Participation Level: Did not attend Dione Housekeeper

## 2012-03-17 NOTE — Progress Notes (Signed)
[facility] Group Notes:  (Nursing/MHT/Case Management/Adjunct)  Date:  03/17/2012  Time:  7:24 PM  Type of Therapy:  Psychoeducational Skills  Participation Level:  Did Not Attend  Participation Quality:  did not attendx  Affect:  did not attend  Cognitive:  did not attend  Insight:  None  Engagement in Group:  None  Modes of Intervention:  did not attend  Summary of Progress/Problems:  Adelina Mings 03/17/2012, 7:24 PM

## 2012-03-17 NOTE — H&P (Signed)
Psychiatric Admission Assessment Adult  Patient Identification:  Brandon [name]  Date of Evaluation:  03/17/2012  Chief Complaint:  BIPOLAR D/O,NOS POLYSUBSTANCE DEPENDENCE  History of Present Illness: This is an admission assessment for this 47 year old Caucasian male. Admitted to [facility] from the [facility] with complaint of drug use and threats of suicide/homicide. Patient reports, "I was taken to the [facility] ED by the ambulance. I recently had a bad day, problem with my girlfriend, did not know what to do about it how I was feeling, unable to handle it, I resort to using cocaine. 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 [facility] I was x 10 years sober until the other day. I use too much cocaine I believe. For a moment after I had stopped using, my heart started beating so fast. It was racing so bad that I thought that it was going to stop beating. That was when I called the ambulance. This is my first time in this hospital, but I had been to the [facility] about 8 years ago. I have bipolar disorder. It was diagnosed 4 years ago. I take Geodon 20 mg. Right now I'm having withdrawal symptoms; sweating, abdominal pain and some diarrhea".  Elements:  Location:  [facility] adult unit. Quality:  Sweats, abd cramps, diarrhea. Severity:  "My heart started beating so fast like it was going to stop". Timing:  "That was 2 days ago". Duration:  "I have a long history of drug use, but I was sober x 10 years until recently". Context:  "I lost my job now, messed up my sobriety"..  Associated Signs/Synptoms:  Depression Symptoms:  depressed mood, feelings of worthlessness/guilt, difficulty concentrating, suicidal thoughts without plan,  (Hypo) Manic Symptoms:  Impulsivity,  Anxiety Symptoms:  Excessive Worry,  Psychotic Symptoms:  Hallucinations: None  PTSD Symptoms: None reported  Psychiatric Specialty Exam: Physical Exam  Constitutional: He is  oriented to person, place, and time. He appears well-developed and well-nourished.  HENT:  Head: Normocephalic.  Eyes: Pupils are equal, round, and reactive to light.  Neck: Normal range of motion.  Cardiovascular: Normal rate.   Respiratory: Effort normal.  GI: Soft.  Musculoskeletal: Normal range of motion.  Neurological: He is alert and oriented to person, place, and time.  Skin: Skin is warm and dry.  Psychiatric: His mood appears anxious. His speech is delayed and slurred. He is withdrawn. Thought content is not paranoid. Cognition and memory are normal. He expresses inappropriate judgment. He exhibits a depressed mood. He expresses no homicidal and no suicidal ideation.    Review of Systems  Constitutional: Negative.   HENT: Negative.   Eyes: Negative.   Respiratory: Negative.   Cardiovascular: Negative.   Gastrointestinal: Negative.   Genitourinary: Negative.   Musculoskeletal: Positive for myalgias.  Skin: Negative.   Neurological: Negative.   Endo/Heme/Allergies: Negative.   Psychiatric/Behavioral: Positive for suicidal ideas and substance abuse.    Blood pressure 110/74, pulse 89, temperature 97.1 F (36.2 C), resp. rate 20, weight 74.39 kg (164 lb).There is no height on file to calculate BMI.  General Appearance: Disheveled  Eye Contact::  Poor  Speech:  Slurred  Volume:  Decreased  Mood:  Depressed  Affect:  Flat and Restricted  Thought Process:  Coherent and Intact  Orientation:  Full (Time, Place, and Person)  Thought Content:  Rumination  Suicidal Thoughts:  Yes.  without intent/plan on admission,  Homicidal Thoughts:  Yes.  without intent/plan  on admission  Memory:  Immediate;   Good Recent;   Good Remote;   Good  Judgement:  Impaired  Insight:  Fair  Psychomotor Activity:  Decreased  Concentration:  Poor  Recall:  Good  Akathisia:  No  Handed:  Right  AIMS (if indicated):     Assets:  Desire for Improvement  Sleep:  Number of Hours: 5.75      Past Psychiatric History: Diagnosis: Bipolar affective disorder, Cocaine abuse.  Hospitalizations: [facility] 8 years ago, [facility]  Outpatient Care: [facility]  Substance Abuse Care: "[place] long time ago"  Self-Mutilation: Denies  Suicidal Attempts: Denies attempts, admits threats.  Violent Behaviors: None reported   Past Medical History:   Past Medical History  Diagnosis Date  . Drug abuse   . Kidney failure   . Bipolar 1 disorder    Allergies:  No Known Allergies  PTA Medications: Prescriptions prior to admission  Medication Sig Dispense Refill  . folic acid (FOLVITE) 1 MG tablet Take 1 tablet (1 mg total) by mouth daily.      Marland Kitchen HYDROcodone-acetaminophen (NORCO/VICODIN) 5-325 MG per tablet Take 1-2 tablets by mouth every 6 (six) hours as needed.  30 tablet  0  . LORazepam (ATIVAN) 1 MG tablet Take 1 tablet (1 mg total) by mouth every 4 (four) hours as needed for anxiety.  30 tablet  0  . Multiple Vitamin (MULTIVITAMIN WITH MINERALS) TABS Take 1 tablet by mouth daily.  30 tablet    . OLANZapine (ZYPREXA) 10 MG tablet Take 1 tablet (10 mg total) by mouth 2 (two) times daily as needed (agitation).      . thiamine 100 MG tablet Take 1 tablet (100 mg total) by mouth daily.        Previous Psychotropic Medications:  Medication/Dose  See lists medications above.               Substance Abuse History in the last 12 months:  yes   Consequences of Substance Abuse: Medical Consequences:  Liver damage, Possible death by overdose Legal Consequences:  Arrests, jail time, Loss of driving privilege. Family Consequences:  Family discord, divorce and or separation.   Social History:  reports that he has never smoked. He has never used smokeless tobacco. He reports that he drinks alcohol. He reports that he uses illicit drugs (Cocaine and Methamphetamines). Additional Social History:  Current Place of Residence: [place], [place]  Place of Birth: [place], [place]     Family Members: "I have 2 girls"  Marital Status:  Single  Children: 0  Sons: 0  Daughters: 2  Relationships: Single  Education:  [employer] Financial pl[employer] Problems/Performance: Completed high school.  Religious Beliefs/Practices: None reported  History of Abuse (Emotional/Phsycial/Sexual): Denies  Occupational Experiences: Unemployed  Military History:  None.  Legal History: None reported  Hobbies/Interests:  Family History:   Family History  Problem Relation Age of Onset  . Heart disease Mother   . Heart disease Father     No results found for this or any previous visit (from the past 72 hour(s)). Psychological Evaluations:  Assessment:   AXIS I:  Cocaine abuse AXIS II:  Deferred AXIS III:   Past Medical History  Diagnosis Date  . Drug abuse   . Kidney failure   . Bipolar 1 disorder    AXIS IV:  other psychosocial or environmental problems and Substance absue/dependency AXIS V:  11-20 some danger of hurting self or others possible OR occasionally fails  to maintain minimal personal hygiene OR gross impairment in communication  Treatment Plan/Recommendations: 1. Admit for crisis management and stabilization.  2. Medication management to reduce current symptoms to base line and improve the patient's overall level of functioning  3. Treat health problems as indicated.  4. Develop treatment plan to decrease risk of relapse upon discharge and the need for readmission.  5. Psycho-social education regarding relapse prevention and self care.  6. Health care follow up as needed for medical problems.  7. Review and reinstate any pertinent home medications for other health issues where appropriate.   Treatment Plan Summary: Daily contact with patient to assess and evaluate symptoms and progress in treatment Medication management  Current Medications:  Current Facility-Administered Medications  Medication Dose Route Frequency Provider Last Rate Last Dose  .  acetaminophen (TYLENOL) tablet 650 mg  650 mg Oral Q6H PRN Rachael Fee, MD      . alum & mag hydroxide-simeth (MAALOX/MYLANTA) 200-200-20 MG/5ML suspension 30 mL  30 mL Oral Q4H PRN Rachael Fee, MD      . folic acid (FOLVITE) tablet 1 mg  1 mg Oral Daily Sanjuana Kava, NP      . magnesium hydroxide (MILK OF MAGNESIA) suspension 30 mL  30 mL Oral Daily PRN Rachael Fee, MD      . multivitamin with minerals tablet 1 tablet  1 tablet Oral Daily Sanjuana Kava, NP      . OLANZapine (ZYPREXA) tablet 10 mg  10 mg Oral BID PRN Rachael Fee, MD   10 mg at 03/16/12 1818  . traZODone (DESYREL) tablet 50 mg  50 mg Oral QHS PRN,MR X 1 Sanjuana Kava, NP   50 mg at 03/16/12 2251  . ziprasidone (GEODON) 20 MG capsule             Observation Level/Precautions:  15 minute checks  Laboratory:  Reviewed ED lab findings on file.  Psychotherapy: Group sessions.   Medications:  See medication lists  Consultations:  None indicated  Discharge Concerns:  Sobriety  Estimated LOS: 5-7 days  Other:     I certify that inpatient services furnished can reasonably be expected to improve the patient's condition.   Armandina Stammer I 1/19/20149:43 AM

## 2012-03-17 NOTE — Clinical Social Work Psychosocial (Signed)
[facility] Group Notes: (Clinical Social Work)   03/17/2012      Type of Therapy:  Group Therapy   Participation Level:  Did Not Attend    Ambrose Mantle, LCSW 03/17/2012, 11:20 AM

## 2012-03-17 NOTE — Progress Notes (Signed)
Patient did attend the evening speaker AA meeting.  

## 2012-03-17 NOTE — Progress Notes (Signed)
Psychoeducational Group Note  Date:  03/17/2012 Time: 1315 Group Topic/Focus:  Making Healthy Choices:   The focus of this group is to help patients identify negative/unhealthy choices they were using prior to admission and identify positive/healthier coping strategies to replace them upon discharge.  Participation Level:  Did Not Attend   Additional Comments:    Rich Brave 3:55 PM. 03/17/2012

## 2012-03-17 NOTE — [facility] Suicide Risk Assessment (Signed)
Suicide Risk Assessment  Admission Assessment     Nursing information obtained from:  Patient Demographic factors:  Male Current Mental Status:  Suicidal ideation indicated by patient few days ago, Denies SI and HI, now, mood not good, affect restricted, logical most of the time, limited insight Loss Factors:  Loss of significant relationship Historical Factors:  Impulsivity Risk Reduction Factors:  Employed;Positive therapeutic relationship  CLINICAL FACTORS:   Alcohol/Substance Abuse/Dependencies  COGNITIVE FEATURES THAT CONTRIBUTE TO RISK:  Closed-mindedness    SUICIDE RISK:   Moderate:  Frequent suicidal ideation with limited intensity, and duration, some specificity in terms of plans, no associated intent, good self-control, limited dysphoria/symptomatology, some risk factors present, and identifiable protective factors, including available and accessible social support.  PLAN OF CARE:  Start geodon 20 mg BID for mood Labs tomorrow ck, liopids, a1c and tsh  I certify that inpatient services furnished can reasonably be expected to improve the patient's condition.  Wonda Cerise 03/17/2012, 11:48 AM

## 2012-03-18 DIAGNOSIS — F319 Bipolar disorder, unspecified: Secondary | ICD-10-CM

## 2012-03-18 LAB — TSH: TSH: 1.642 u[IU]/mL (ref 0.350–4.500)

## 2012-03-18 LAB — LIPID PANEL
Cholesterol: 173 mg/dL (ref 0–200)
HDL: 40 mg/dL (ref >39)
LDL Cholesterol: 112 mg/dL — ABNORMAL HIGH (ref 0–99)
Triglycerides: 103 mg/dL (ref <150)

## 2012-03-18 LAB — CK: Total CK: 62 U/L (ref 7–232)

## 2012-03-18 MED ORDER — ADULT MULTIVITAMIN W/MINERALS CH: 1.0000 | ORAL_TABLET | Freq: Every day | ORAL | Status: DC

## 2012-03-18 MED ORDER — FOLIC ACID 1 MG PO TABS: 1.0000 mg | ORAL_TABLET | Freq: Every day | ORAL | Status: DC

## 2012-03-18 MED ORDER — ZIPRASIDONE HCL 60 MG PO CAPS: 60.0000 mg | ORAL_CAPSULE | Freq: Two times a day (BID) | ORAL | Status: DC

## 2012-03-18 MED ORDER — ZIPRASIDONE HCL 40 MG PO CAPS
40.0000 mg | ORAL_CAPSULE | Freq: Once | ORAL | Status: AC
  Administered 2012-03-18: 40 mg via ORAL
  Filled 2012-03-18: qty 1

## 2012-03-18 MED ORDER — TRAZODONE HCL 50 MG PO TABS: 50.0000 mg | ORAL_TABLET | Freq: Every evening | ORAL | Status: DC | PRN

## 2012-03-18 MED ORDER — ZIPRASIDONE HCL 60 MG PO CAPS
60.0000 mg | ORAL_CAPSULE | Freq: Two times a day (BID) | ORAL | Status: DC
  Filled 2012-03-18 (×2): qty 1

## 2012-03-18 NOTE — [facility] Counselor (Signed)
Adult Comprehensive Assessment  Patient ID: Brandon [name], male   DOB: 06-18-1965, 47 y.o.   MRN: [id number]  Information Source: Information source: Patient  Current Stressors:  Educational / Learning stressors: In school Employment / Job issues: NA Family Relationships: NA Surveyor, quantity / Lack of resources (include bankruptcy): "Tight" Housing / Lack of housing: NA Physical health (include injuries & life threatening diseases): NA Social relationships: "I have friends" Substance abuse: "I told you I was clean except for the other night" Bereavement / Loss: NA  Living/Environment/Situation:  Living Arrangements: Parent Living conditions (as described by patient or guardian): "Fine" How long has patient lived in current situation?: 11 months What is atmosphere in current home: Comfortable;Supportive  Family History:  Marital status: Divorced Divorced, when?: Feb 2013 What types of issues is patient dealing with in the relationship?: "I didn't want to be there anymore" Additional relationship information: NA Does patient have children?: Yes How many children?: 2  How is patient's relationship with their children?: Fine with 2 adult children  Childhood History:  By whom was/is the patient raised?: Both parents Additional childhood history information: [place] family Description of patient's relationship with caregiver when they were a child: Good with both Patient's description of current relationship with people who raised him/her: Remains good with both, more argumentative with father sometimes Does patient have siblings?: No Did patient suffer any verbal/emotional/physical/sexual abuse as a child?: No Did patient suffer from severe childhood neglect?: No Has patient ever been sexually abused/assaulted/raped as an adolescent or adult?: No Was the patient ever a victim of a crime or a disaster?: No Witnessed domestic violence?: No Has patient been effected by domestic violence as an  adult?: No  Education:  Highest grade of school patient has completed: 12th plus in school for welding now Currently a student?: Yes If yes, how has current illness impacted academic performance: "I'm missing school now." Name of school: Veter[employer] surgeon person: Unknown How long has the patient attended?: 1 month Learning disability?: No  Employment/Work Situation:   Employment situation: Employed Where is patient currently employed?: [employer] How long has patient been employed?: "couple of seeks" Patient's job has been impacted by current illness: No What is the longest time patient has a held a job?: 3 Years Where was the patient employed at that time?: [employer] Has patient ever been in the [employer]?: Yes (Describe in comment) Field seismologist for 1 year) Has patient ever served in Buyer, retail?: No  Financial Resources:   Surveyor, quantity resources: Income from employment;Food stamps Does patient have a representative payee or guardian?: No  Alcohol/Substance Abuse:   What has been your use of drugs/alcohol within the last 12 months?:  relapse involving cocaine, amphetamine and alcohol If attempted suicide, did drugs/alcohol play a role in this?:  (No Attempt) Alcohol/Substance Abuse Treatment Hx: Past Tx, Inpatient If yes, describe treatment: [facility] Has alcohol/substance abuse ever caused legal problems?: Yes ([facility])  Social Support System:   Patient's Community Support System: Good Describe Community Support System: Family and friends Type of faith/religion: NA How does patient's faith help to cope with current illness?: NA  Leisure/Recreation:   Leisure and Hobbies: "Work and school takes up all my time"  Strengths/Needs:   What things does the patient do well?: School In what areas does patient struggle / problems for patient: Spelling  Discharge Plan:   Does patient have access to transportation?: Yes Will patient be returning to same living situation after discharge?:  Yes Currently receiving community mental health services: Yes (  From Whom) ([facility] ) If no, would patient like referral for services when discharged?: No Does patient have financial barriers related to discharge medications?: No  Summary/Recommendations:   Summary and Recommendations (to be completed by the evaluator): Patient is 47 YO divorced employed caucasian male admitted with diagnosis of Bipolar Disorder NOS and Polysubstance disorder.  Patient is being discharged by psychiatrist same day as this writer met patient.; CSW will set followup appointment with  patient's out patient provider.   Clide Dales. 03/18/2012

## 2012-03-18 NOTE — Progress Notes (Signed)
[facility] LCSW Aftercare Discharge Planning Group Note  03/18/2012 12:48 PM  Participation Quality:  Did Not Attend   Summary of Progress/Problems: Patient was with Dr Dub Mikes during discharge planning group.  CSW meet with patient after group to conduct PSA as patient discharging later today as per physician.   Clide Dales 03/18/2012, 12:48 PM

## 2012-03-18 NOTE — [facility] Suicide Risk Assessment (Signed)
Suicide Risk Assessment  Discharge Assessment     Demographic Factors:  Male and Caucasian  Mental Status Per Nursing Assessment::   On Admission:  Suicidal ideation indicated by patient  Current Mental Status by Physician: In full contact with reality. Denies suicidal or homicidal ideas, plans or intent. He endorses that he accepts that the relationship is over with the ex girlfriend. Recognizes it was not healthy to begin with. He also recognizes that he did not have to go back to use cocaine as he is aware of the consequences. He endorses that as long as he keeps taking his Geodon 120 mg daily he does well. He stays with his parents who are elderly. He helps to take care of them. He has a job, and he is in school. He does well for himself. States that he is not going to pursue any relationships right now.   Loss Factors: Loss of significant relationship  Historical Factors: NA  Risk Reduction Factors:   Sense of responsibility to family, Employed, Living with another person, especially a relative and Positive social support  Continued Clinical Symptoms:  Bipolar Disorder:   Depressive phase Alcohol/Substance Abuse/Dependencies  Cognitive Features That Contribute To Risk: None identified   Suicide Risk:  Minimal: No identifiable suicidal ideation.  Patients presenting with no risk factors but with morbid ruminations; may be classified as minimal risk based on the severity of the depressive symptoms  Discharge Diagnoses:   AXIS I:  Bipolar, Depressed, Cocaine abuse AXIS II:  Deferred AXIS III:   Past Medical History  Diagnosis Date  . Drug abuse   . Kidney failure   . Bipolar 1 disorder    AXIS IV:  none identified AXIS V:  61-70 mild symptoms  Plan Of Care/Follow-up recommendations:  Activity:  As tolerated Diet:  Regular Outpatient follow up/continue Geodon/relapse prevention  Is patient on multiple antipsychotic therapies at discharge:  No   Has Patient had  three or more failed trials of antipsychotic monotherapy by history:  No  Recommended Plan for Multiple Antipsychotic Therapies: N/A   Brandon [name] A 03/18/2012, 1:52 PM

## 2012-03-18 NOTE — Progress Notes (Signed)
[facility] Adult Case Management Discharge Plan :  Will you be returning to the same living situation after discharge: Yes,  home with parents At discharge, do you have transportation home?:Yes,  father Do you have the ability to pay for your medications:Yes,    Release of information consent forms completed and in the chart;  Patient's signature needed at discharge.  Patient to Follow up at: Follow-up Information    Follow up with [facility]. (Go to walkin clinic either Wednesday 1/22 or Thursday 1/23 between the hours of 8AM and 9AM for followup)    Contact information:   [address] [place], [place] [address] Phone: [phone] Fax: [phone]           Patient denies SI/HI:   Yes,      Safety Planning and Suicide Prevention discussed:  Yes,  with father.  Clide Dales 03/18/2012, 12:59 PM

## 2012-03-18 NOTE — Progress Notes (Signed)
[facility] LCSW Group Therapy  03/18/2012 2:22 PM  Type of Therapy:  Group Therapy  Participation Level:  Did Not Attend  Brandon [name] 03/18/2012, 2:22 PM 

## 2012-03-18 NOTE — Progress Notes (Signed)
[facility] INPATIENT:  Family/Significant Other Suicide Prevention Education  Suicide Prevention Education:  Education Completed; Patient's father, Gussie Towson, Sr at [phone] has been identified by the patient as the family member/significant other with whom the patient will be residing, and identified as the person(s) who will aid the patient in the event of a mental health crisis (suicidal ideations/suicide attempt).  With written consent from the patient, the family member/significant other has been provided the following suicide prevention education, prior to the and/or following the discharge of the patient.  The suicide prevention education provided includes the following:  Suicide risk factors  Suicide prevention and interventions  National Suicide Hotline telephone number  [facility] assessment telephone number  [facility] Emergency Assistance 911  [facility] and/or Residential Mobile Crisis Unit telephone number  Request made of family/significant other to:  Remove weapons (e.g., guns, rifles, knives), all items previously/currently identified as safety concern. Father has one firearm in the home which is secured.    Remove drugs/medications (over-the-counter, prescriptions, illicit drugs), all items previously/currently identified as a safety concern.  The family member/significant other verbalizes understanding of the suicide prevention education information provided.  The family member/significant other agrees to remove the items of safety concern listed above.  Clide Dales 03/18/2012, 11:47 AM

## 2012-03-18 NOTE — Discharge Summary (Signed)
Physician Discharge Summary Note  Patient:  Brandon [name] is an 47 y.o., male MRN:  [id number] DOB:  03-15-1965 Patient phone:  [phone] (home)  Patient address:   [address] [place] [address],   Date of Admission:  03/16/2012  Date of Discharge: 03/18/12  Reason for Admission:  Adverse reaction to Cocaine  Discharge Diagnoses: Principal Problem:  *Cocaine abuse Active Problems:  Bipolar affective disorder  Review of Systems  Constitutional: Negative.   Eyes: Negative.   Respiratory: Negative.   Cardiovascular: Negative.   Gastrointestinal: Negative.   Genitourinary: Negative.   Musculoskeletal: Negative.   Skin: Negative.   Neurological: Negative.   Endo/Heme/Allergies: Negative.   Psychiatric/Behavioral: Negative.    Axis Diagnosis:   AXIS I:  Bipolar affective disorder, Cocaine abuse AXIS II:  Deferred AXIS III:   Past Medical History  Diagnosis Date  . Drug abuse   . Kidney failure   . Bipolar 1 disorder    AXIS IV:  other psychosocial or environmental problems AXIS V:  63  Level of Care:  OP  Hospital Course:  This is an admission assessment for this 47 year old Caucasian male. Admitted to [facility] from the [facility] with complaint of drug use and threats of suicide/homicide. Patient reports, "I was taken to the [facility] ED by the ambulance. I recently had a bad day, problem with my girlfriend, did not know what to do about it, how I was feeling, unable to handle it, I resort to using cocaine. 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 [facility], I was x 10 years sober until the other day. I use too much cocaine I believe. For a moment after I had stopped using, my heart started beating so fast. It was racing so bad that I thought that it was going to stop beating. That was when I called the ambulance. This is my first time in this hospital, but I had been to the [facility] about 8 years ago.  I have bipolar disorder. It was diagnosed 4 years ago. I take Geodon 20 mg. Right now I'm having withdrawal symptoms; sweating, abdominal pain and some diarrhea".   Mr. Brandon [name] admission stay in this hospital was rather brief. He came in with reports of brief cocaine abuse and intoxication after 10 years sobriety. He also reported history of bipolar disorder and at the time was having withdrawal symptoms of cocaine. And as it is known that there were no detoxification treatment protocol for cocaine intoxication, he was rather started on Geodon 60 mg for mood control and stabilization. However, he was monitored closely for any other symptoms he might present including present withdrawal symptoms. Mr. Brandon [name] tolerated his treatment regimen without any significant adverse effects and or reactions presented.  By the second hospitalization day, patient reported that he was feeling a lot better and requested to be discharged to his home. It is quite obvious that patient's condition has stabilized by his over all out look and his reports of improved symptoms, presentation of good mood/affects. To maintain stabilization, it was agreed upon that patient will continue psychiatric care on outpatient basis at the [facility] in [place], [place] on 03/21/12 at 08:000 am to 09:00 am. The address, date and time for this appointment provided for patient in writing.  Upon discharge, patient denies suicidal, homicidal ideations, auditory, visual hallucinations, delusional thinking, paranoia and or withdrawal symptoms. He received 4 days worth  supply samples of his [facility] discharge medications. He left [facility] with all personal belongings via personal arranged transport in no apparent distress.  Consults:  None  Significant Diagnostic Studies:  labs: CBC with diff, CMP, UDS, Toxicology reports.  Discharge Vitals:   Blood pressure 149/76, pulse 87, temperature 97.7 F (36.5 C), temperature source Oral, resp. rate 20,  weight 74.39 kg (164 lb). There is no height on file to calculate BMI. Lab Results:   Results for orders placed during the hospital encounter of 03/16/12 (from the past 72 hour(s))  LIPID PANEL     Status: Abnormal   Collection Time   03/18/12  6:25 AM      Component Value Range Comment   Cholesterol 173  0 - 200 mg/dL    Triglycerides 147  <829 mg/dL    HDL 40  >56 mg/dL    Total CHOL/HDL Ratio 4.3      VLDL 21  0 - 40 mg/dL    LDL Cholesterol 213 (*) 0 - 99 mg/dL   HEMOGLOBIN Y8M     Status: Abnormal   Collection Time   03/18/12  6:25 AM      Component Value Range Comment   Hemoglobin A1C 5.7 (*) <5.7 %    Mean Plasma Glucose 117 (*) <117 mg/dL   CK     Status: Normal   Collection Time   03/18/12  6:25 AM      Component Value Range Comment   Total CK 62  7 - 232 U/L   TSH     Status: Normal   Collection Time   03/18/12  6:25 AM      Component Value Range Comment   TSH 1.642  0.350 - 4.500 uIU/mL     Physical Findings: AIMS: Facial and Oral Movements Muscles of Facial Expression: None, normal Lips and Perioral Area: None, normal Jaw: None, normal Tongue: None, normal,Extremity Movements Upper (arms, wrists, hands, fingers): None, normal Lower (legs, knees, ankles, toes): None, normal, Trunk Movements Neck, shoulders, hips: None, normal, Overall Severity Severity of abnormal movements (highest score from questions above): None, normal Incapacitation due to abnormal movements: None, normal Patient's awareness of abnormal movements (rate only patient's report): No Awareness, Dental Status Current problems with teeth and/or dentures?: Yes Does patient usually wear dentures?: No  CIWA:  CIWA-Ar Total: 10  COWS:  COWS Total Score: 6   Psychiatric Specialty Exam: See Psychiatric Specialty Exam and Suicide Risk Assessment completed by Attending Physician prior to discharge.  Discharge destination:  Home  Is patient on multiple antipsychotic therapies at discharge:  No     Has Patient had three or more failed trials of antipsychotic monotherapy by history:  No  Recommended Plan for Multiple Antipsychotic Therapies: NA     Medication List     As of 03/19/2012 11:18 AM    STOP taking these medications         HYDROcodone-acetaminophen 5-325 MG per tablet   Commonly known as: NORCO/VICODIN      LORazepam 1 MG tablet   Commonly known as: ATIVAN      OLANZapine 10 MG tablet   Commonly known as: ZYPREXA      thiamine 100 MG tablet      TAKE these medications      Indication    folic acid 1 MG tablet   Commonly known as: FOLVITE   Take 1 tablet (1 mg total) by mouth daily. For Folic acid replacement  multivitamin with minerals Tabs   Take 1 tablet by mouth daily. For vitamin replacement       traZODone 50 MG tablet   Commonly known as: DESYREL   Take 1 tablet (50 mg total) by mouth at bedtime as needed and may repeat dose one time if needed for sleep. For sleep       ziprasidone 60 MG capsule   Commonly known as: GEODON   Take 1 capsule (60 mg total) by mouth 2 (two) times daily with a meal. For mood control         Follow-up Information    Follow up with [facility]. (Go to walkin clinic either Wednesday 1/22 or Thursday 1/23 between the hours of 8AM and 9AM for followup)    Contact information:   [address] [place], [place] [address] Phone: [phone] Fax: [phone]           Follow-up recommendations:  Activity:  as tolerated Other:  Keep all scheduled follow-up appointments as recommended.    Comments:  Take all your medications as prescribed by your mental healthcare provider. Report any adverse effects and or reactions from your medicines to your outpatient provider promptly. Patient is instructed and cautioned to not engage in alcohol and or illegal drug use while on prescription medicines. In the event of worsening symptoms, patient is instructed to call the crisis hotline, 911 and or go to the  nearest ED for appropriate evaluation and treatment of symptoms. Follow-up with your primary care provider for your other medical issues, concerns and or health care needs.     Total Discharge Time:  Greater than 30 minutes.  SignedArmandina Stammer I 03/19/2012, 11:18 AM

## 2012-03-18 NOTE — Progress Notes (Signed)
Patient ID: Brandon [name], male   DOB: 1965-12-22, 46 y.o.   MRN: [id number] Patient has order for d/c and denies any SI/HI. He verbalized understanding of follow up and medications. He received medications samples at time of d/c. His mood appeared stable at time of discharge.

## 2012-03-21 NOTE — Progress Notes (Signed)
Patient Discharge Instructions:  After Visit Summary (AVS):   Faxed to:  03/21/12 Discharge Summary Note:   Faxed to:  03/21/12 Psychiatric Admission Assessment Note:   Faxed to:  03/21/12 Suicide Risk Assessment - Discharge Assessment:   Faxed to:  03/21/12 Faxed/Sent to the Next Level Care provider:  03/21/12 Faxed to [facility] @ [phone]  [name], 03/21/2012, 2:45 PM

## 2012-03-21 NOTE — Discharge Summary (Signed)
Agree with assessment and plan Myana Schlup A. Giulianna Rocha, M.D. 

## 2012-05-07 ENCOUNTER — Emergency Department (HOSPITAL_BASED_OUTPATIENT_CLINIC_OR_DEPARTMENT_OTHER)
Admission: EM | Admit: 2012-05-07 | Discharge: 2012-05-07 | Disposition: A | Discharge status: Home / Self Care | Payer: Self-pay | Attending: Emergency Medicine | Admitting: Emergency Medicine

## 2012-05-07 ENCOUNTER — Encounter (HOSPITAL_BASED_OUTPATIENT_CLINIC_OR_DEPARTMENT_OTHER): Payer: Self-pay | Admitting: *Deleted

## 2012-05-07 DIAGNOSIS — Z87448 Personal history of other diseases of urinary system: Secondary | ICD-10-CM | POA: Insufficient documentation

## 2012-05-07 DIAGNOSIS — F1120 Opioid dependence, uncomplicated: Secondary | ICD-10-CM | POA: Insufficient documentation

## 2012-05-07 DIAGNOSIS — Z8739 Personal history of other diseases of the musculoskeletal system and connective tissue: Secondary | ICD-10-CM | POA: Insufficient documentation

## 2012-05-07 DIAGNOSIS — Z79899 Other long term (current) drug therapy: Secondary | ICD-10-CM | POA: Insufficient documentation

## 2012-05-07 LAB — URINALYSIS, ROUTINE W REFLEX MICROSCOPIC
Hgb urine dipstick: NEGATIVE
Specific Gravity, Urine: 1.02 (ref 1.005–1.030)
Urobilinogen, UA: 0.2 mg/dL (ref 0.0–1.0)

## 2012-05-07 LAB — COMPREHENSIVE METABOLIC PANEL
BUN: 14 mg/dL (ref 6–23)
Calcium: 8.9 mg/dL (ref 8.4–10.5)
Creatinine, Ser: 1 mg/dL (ref 0.50–1.35)
GFR calc Af Amer: >90 mL/min (ref >90)
Glucose, Bld: 120 mg/dL — ABNORMAL HIGH (ref 70–99)
Sodium: 139 mEq/L (ref 135–145)
Total Protein: 6.9 g/dL (ref 6.0–8.3)

## 2012-05-07 LAB — CBC
HCT: 42.2 % (ref 39.0–52.0)
Hemoglobin: 15.1 g/dL (ref 13.0–17.0)
MCH: 31.8 pg (ref 26.0–34.0)
MCHC: 35.8 g/dL (ref 30.0–36.0)
MCV: 88.8 fL (ref 78.0–100.0)

## 2012-05-07 LAB — ACETAMINOPHEN LEVEL: Acetaminophen (Tylenol), Serum: 25.7 ug/mL (ref 10–30)

## 2012-05-07 LAB — RAPID URINE DRUG SCREEN, HOSP PERFORMED
Cocaine: NOT DETECTED
Opiates: NOT DETECTED
Tetrahydrocannabinol: NOT DETECTED

## 2012-05-07 LAB — SALICYLATE LEVEL: Salicylate Lvl: <2 mg/dL — ABNORMAL LOW (ref 2.8–20.0)

## 2012-05-07 NOTE — ED Notes (Signed)
Was taking 60 mg of oxycodone per day states was buying off the street  Has been taking that amount each day for 4 months per pt

## 2012-05-07 NOTE — ED Notes (Signed)
Patient states he is having withdrawal from Opioids that he buys off the street.  States he takes 2 pills per day, last dose yesterday morning at 0830 am.  Pt is demanding that I get a doctor and get him some medication for withdrawal.  Pt is sleeping intermittently , perrla, denies sweating.  No anxiety noted.  C/O abdominal pain and cramps.

## 2012-05-07 NOTE — ED Notes (Signed)
Deliah Boston, np escorted by this rn to speak with pt and inform him that we will not prescribe medications for him today. Pt is angry, states "I guess I just wasted my damn time coming in here then!" pt refuses to sign discharge instructions, paperwork given and pt throws it into the garbage can. Pt walks out of ed with quick steady gait in nad.

## 2012-05-07 NOTE — ED Provider Notes (Signed)
Medical screening examination/treatment/procedure(s) were performed by non-physician practitioner and as supervising physician I was immediately available for consultation/collaboration.   Jon R Knapp, MD 05/07/12 1545 

## 2012-05-07 NOTE — ED Provider Notes (Signed)
History     CSN: [id number]  Arrival date & time 05/07/12  1149   First MD Initiated Contact with Patient 05/07/12 1204      Chief Complaint  Patient presents with  . detox from oxycodone since monday     (Consider location/radiation/quality/duration/timing/severity/associated sxs/prior treatment) HPI Comments: Pt states that he uses 60mg  of oxycodone a day for the last 4 months:pt states that he buys them off the street:pt states that he is not hi/si:pt states that he want to do outpt treatment himself and he is here because we have helped in the past with the symptoms:pt states that he has not had any narcotics in 27 hours:pt denies n/v/d, sob  The history is provided by the patient. No language interpreter was used.    Past Medical History  Diagnosis Date  . Rhabdomyolysis   . Acute renal failure   . Opiate abuse, continuous     Past Surgical History  Procedure Laterality Date  . Knee surgery      History reviewed. No pertinent family history.  History  Substance Use Topics  . Smoking status: Never Smoker   . Smokeless tobacco: Not on file  . Alcohol Use: No      Review of Systems  Constitutional: Negative.   Respiratory: Negative.   Cardiovascular: Negative.     Allergies  Review of patient's allergies indicates no known allergies.  Home Medications   Current Outpatient Rx  Name  Route  Sig  Dispense  Refill  . zolpidem (AMBIEN) 10 MG tablet   Oral   Take 10 mg by mouth at bedtime as needed. For insomnia         . ALPRAZolam (XANAX) 1 MG tablet   Oral   Take 1 mg by mouth at bedtime as needed. For sleep.         . cloNIDine (CATAPRES) 0.1 MG tablet   Oral   Take 1 tablet (0.1 mg total) by mouth 3 (three) times daily as needed (withdrawal symptoms).   9 tablet   0   . cloNIDine (CATAPRES) 0.2 MG tablet   Oral   Take 0.5 tablets (0.1 mg total) by mouth 2 (two) times daily.   10 tablet   0   . hydrOXYzine (ATARAX/VISTARIL) 25 MG  tablet   Oral   Take 1 tablet (25 mg total) by mouth every 6 (six) hours as needed for anxiety.   12 tablet   0   . LORazepam (ATIVAN) 1 MG tablet   Oral   Take 1 tablet (1 mg total) by mouth every 6 (six) hours as needed for anxiety (agitation).   12 tablet   0   . Oxycodone HCl 10 MG TABS   Oral   Take 10 mg by mouth daily as needed. For breakthrough pain.         . ziprasidone (GEODON) 60 MG capsule   Oral   Take 60 mg by mouth daily.           There were no vitals taken for this visit.  Physical Exam  Nursing note and vitals reviewed. Constitutional: He is oriented to person, place, and time. He appears well-developed and well-nourished.  HENT:  Head: Normocephalic and atraumatic.  Eyes: Conjunctivae and EOM are normal. Pupils are equal, round, and reactive to light.  Neck: Normal range of motion.  Cardiovascular: Normal rate and regular rhythm.   Pulmonary/Chest: Effort normal and breath sounds normal.  Abdominal: Soft. Bowel sounds are normal.  Musculoskeletal: Normal range of motion.  Neurological: He is alert and oriented to person, place, and time.  Skin: Skin is warm and dry.  Psychiatric: He has a normal mood and affect.    ED Course  Procedures (including critical care time)  Labs Reviewed  URINALYSIS, ROUTINE W REFLEX MICROSCOPIC - Abnormal; Notable for the following:    Glucose, UA 500 (*)    All other components within normal limits  COMPREHENSIVE METABOLIC PANEL - Abnormal; Notable for the following:    Glucose, Bld 120 (*)    GFR calc non Af Amer 89 (*)    All other components within normal limits  SALICYLATE LEVEL - Abnormal; Notable for the following:    Salicylate Lvl <2.0 (*)    All other components within normal limits  URINE RAPID DRUG SCREEN (HOSP PERFORMED)  CBC  ACETAMINOPHEN LEVEL   No results found.   1. Opioid dependence       MDM  Pt no showing any sign of withdraw and pt has no opioids in uds        Teressa Lower, NP 05/07/12 1446

## 2012-05-25 ENCOUNTER — Emergency Department (HOSPITAL_COMMUNITY): Payer: Self-pay

## 2012-05-25 ENCOUNTER — Other Ambulatory Visit: Payer: Self-pay

## 2012-05-25 ENCOUNTER — Inpatient Hospital Stay (HOSPITAL_COMMUNITY)
Admission: EM | Admit: 2012-05-25 | Discharge: 2012-05-30 | DRG: 917 | Payer: MEDICAID | Attending: Pulmonary Disease | Admitting: Pulmonary Disease

## 2012-05-25 ENCOUNTER — Encounter (HOSPITAL_COMMUNITY): Payer: Self-pay | Admitting: Emergency Medicine

## 2012-05-25 ENCOUNTER — Inpatient Hospital Stay (HOSPITAL_COMMUNITY): Payer: Self-pay

## 2012-05-25 DIAGNOSIS — G9340 Encephalopathy, unspecified: Secondary | ICD-10-CM | POA: Diagnosis present

## 2012-05-25 DIAGNOSIS — R4182 Altered mental status, unspecified: Secondary | ICD-10-CM

## 2012-05-25 DIAGNOSIS — R7402 Elevation of levels of lactic acid dehydrogenase (LDH): Secondary | ICD-10-CM

## 2012-05-25 DIAGNOSIS — J9600 Acute respiratory failure, unspecified whether with hypoxia or hypercapnia: Secondary | ICD-10-CM

## 2012-05-25 DIAGNOSIS — T43502A Poisoning by unspecified antipsychotics and neuroleptics, intentional self-harm, initial encounter: Secondary | ICD-10-CM | POA: Diagnosis present

## 2012-05-25 DIAGNOSIS — K3184 Gastroparesis: Secondary | ICD-10-CM

## 2012-05-25 DIAGNOSIS — F19939 Other psychoactive substance use, unspecified with withdrawal, unspecified: Secondary | ICD-10-CM

## 2012-05-25 DIAGNOSIS — T07XXXA Unspecified multiple injuries, initial encounter: Secondary | ICD-10-CM

## 2012-05-25 DIAGNOSIS — T50901A Poisoning by unspecified drugs, medicaments and biological substances, accidental (unintentional), initial encounter: Secondary | ICD-10-CM

## 2012-05-25 DIAGNOSIS — F1120 Opioid dependence, uncomplicated: Secondary | ICD-10-CM

## 2012-05-25 DIAGNOSIS — T754XXA Electrocution, initial encounter: Secondary | ICD-10-CM

## 2012-05-25 DIAGNOSIS — Y92009 Unspecified place in unspecified non-institutional (private) residence as the place of occurrence of the external cause: Secondary | ICD-10-CM

## 2012-05-25 DIAGNOSIS — I959 Hypotension, unspecified: Secondary | ICD-10-CM | POA: Diagnosis present

## 2012-05-25 DIAGNOSIS — T424X4A Poisoning by benzodiazepines, undetermined, initial encounter: Principal | ICD-10-CM | POA: Diagnosis present

## 2012-05-25 DIAGNOSIS — M6282 Rhabdomyolysis: Secondary | ICD-10-CM

## 2012-05-25 DIAGNOSIS — I10 Essential (primary) hypertension: Secondary | ICD-10-CM | POA: Diagnosis present

## 2012-05-25 DIAGNOSIS — F1410 Cocaine abuse, uncomplicated: Secondary | ICD-10-CM

## 2012-05-25 DIAGNOSIS — Z9189 Other specified personal risk factors, not elsewhere classified: Secondary | ICD-10-CM

## 2012-05-25 DIAGNOSIS — T438X2A Poisoning by other psychotropic drugs, intentional self-harm, initial encounter: Secondary | ICD-10-CM | POA: Diagnosis present

## 2012-05-25 DIAGNOSIS — R404 Transient alteration of awareness: Secondary | ICD-10-CM | POA: Diagnosis present

## 2012-05-25 DIAGNOSIS — T43501A Poisoning by unspecified antipsychotics and neuroleptics, accidental (unintentional), initial encounter: Secondary | ICD-10-CM | POA: Diagnosis present

## 2012-05-25 DIAGNOSIS — F319 Bipolar disorder, unspecified: Secondary | ICD-10-CM | POA: Diagnosis present

## 2012-05-25 DIAGNOSIS — D649 Anemia, unspecified: Secondary | ICD-10-CM

## 2012-05-25 DIAGNOSIS — R030 Elevated blood-pressure reading, without diagnosis of hypertension: Secondary | ICD-10-CM

## 2012-05-25 DIAGNOSIS — R110 Nausea: Secondary | ICD-10-CM

## 2012-05-25 DIAGNOSIS — F411 Generalized anxiety disorder: Secondary | ICD-10-CM | POA: Diagnosis present

## 2012-05-25 DIAGNOSIS — N179 Acute kidney failure, unspecified: Secondary | ICD-10-CM

## 2012-05-25 DIAGNOSIS — W868XXA Exposure to other electric current, initial encounter: Secondary | ICD-10-CM

## 2012-05-25 DIAGNOSIS — F1110 Opioid abuse, uncomplicated: Secondary | ICD-10-CM | POA: Diagnosis present

## 2012-05-25 HISTORY — DX: Depression, unspecified: F32.A

## 2012-05-25 HISTORY — DX: Dental caries, unspecified: K02.9

## 2012-05-25 HISTORY — DX: Anxiety disorder, unspecified: F41.9

## 2012-05-25 HISTORY — DX: Major depressive disorder, single episode, unspecified: F32.9

## 2012-05-25 LAB — BASIC METABOLIC PANEL WITH GFR
BUN: 14 mg/dL (ref 6–23)
CO2: 22 meq/L (ref 19–32)
Calcium: 7.9 mg/dL — ABNORMAL LOW (ref 8.4–10.5)
Chloride: 107 meq/L (ref 96–112)
Creatinine, Ser: 0.93 mg/dL (ref 0.50–1.35)
GFR calc Af Amer: >90 mL/min
GFR calc non Af Amer: >90 mL/min
Glucose, Bld: 90 mg/dL (ref 70–99)
Potassium: 3.6 meq/L (ref 3.5–5.1)
Sodium: 139 meq/L (ref 135–145)

## 2012-05-25 LAB — URINALYSIS, ROUTINE W REFLEX MICROSCOPIC
Bilirubin Urine: NEGATIVE
Hgb urine dipstick: NEGATIVE
Ketones, ur: NEGATIVE mg/dL
Nitrite: NEGATIVE
pH: 7 (ref 5.0–8.0)

## 2012-05-25 LAB — CBC
HCT: 35.5 % — ABNORMAL LOW (ref 39.0–52.0)
MCV: 92.9 fL (ref 78.0–100.0)
RBC: 3.82 MIL/uL — ABNORMAL LOW (ref 4.22–5.81)
RDW: 12.9 % (ref 11.5–15.5)
WBC: 5.3 10*3/uL (ref 4.0–10.5)

## 2012-05-25 LAB — BLOOD GAS, ARTERIAL
Bicarbonate: 22.5 mEq/L (ref 20.0–24.0)
Bicarbonate: 21.9 mEq/L (ref 20.0–24.0)
TCO2: 20.4 mmol/L (ref 0–100)
TCO2: 19.6 mmol/L (ref 0–100)
pCO2 arterial: 42 mmHg (ref 35.0–45.0)
pCO2 arterial: 36.7 mmHg (ref 35.0–45.0)
pH, Arterial: 7.349 — ABNORMAL LOW (ref 7.350–7.450)
pH, Arterial: 7.394 (ref 7.350–7.450)
pO2, Arterial: 119 mmHg — ABNORMAL HIGH (ref 80.0–100.0)

## 2012-05-25 LAB — GLUCOSE, CAPILLARY
Glucose-Capillary: 114 mg/dL — ABNORMAL HIGH (ref 70–99)
Glucose-Capillary: 80 mg/dL (ref 70–99)

## 2012-05-25 LAB — LACTIC ACID, PLASMA
Lactic Acid, Venous: 0.7 mmol/L (ref 0.5–2.2)
Lactic Acid, Venous: 1.1 mmol/L (ref 0.5–2.2)

## 2012-05-25 LAB — AMMONIA: Ammonia: 40 umol/L (ref 11–60)

## 2012-05-25 LAB — COMPREHENSIVE METABOLIC PANEL
ALT: 32 U/L (ref 0–53)
AST: 27 U/L (ref 0–37)
Calcium: 9.3 mg/dL (ref 8.4–10.5)
Creatinine, Ser: 1.26 mg/dL (ref 0.50–1.35)
Sodium: 134 mEq/L — ABNORMAL LOW (ref 135–145)
Total Protein: 7.3 g/dL (ref 6.0–8.3)

## 2012-05-25 LAB — PROTIME-INR
INR: 1.05 (ref 0.00–1.49)
Prothrombin Time: 13.6 seconds (ref 11.6–15.2)

## 2012-05-25 LAB — TROPONIN I: Troponin I: <0.3 ng/mL

## 2012-05-25 LAB — URINE MICROSCOPIC-ADD ON

## 2012-05-25 LAB — ETHANOL: Alcohol, Ethyl (B): <11 mg/dL (ref 0–11)

## 2012-05-25 LAB — RAPID URINE DRUG SCREEN, HOSP PERFORMED
Amphetamines: POSITIVE — AB
Barbiturates: NOT DETECTED
Benzodiazepines: POSITIVE — AB
Cocaine: NOT DETECTED

## 2012-05-25 LAB — CK: Total CK: 166 U/L (ref 7–232)

## 2012-05-25 LAB — ACETAMINOPHEN LEVEL: Acetaminophen (Tylenol), Serum: <15 ug/mL (ref 10–30)

## 2012-05-25 LAB — APTT: aPTT: 32 seconds (ref 24–37)

## 2012-05-25 LAB — MAGNESIUM: Magnesium: 1.9 mg/dL (ref 1.5–2.5)

## 2012-05-25 MED ORDER — PROPOFOL 10 MG/ML IV EMUL
5.0000 ug/kg/min | INTRAVENOUS | Status: DC
  Administered 2012-05-25: 5 ug/kg/min via INTRAVENOUS
  Administered 2012-05-26: 30 ug/kg/min via INTRAVENOUS
  Administered 2012-05-26: 20 ug/kg/min via INTRAVENOUS
  Filled 2012-05-25 (×3): qty 100

## 2012-05-25 MED ORDER — SODIUM CHLORIDE 0.9 % IV SOLN
1000.0000 mL | Freq: Once | INTRAVENOUS | Status: AC
  Administered 2012-05-25: 1000 mL via INTRAVENOUS

## 2012-05-25 MED ORDER — SODIUM CHLORIDE 0.9 % IV BOLUS (SEPSIS)
500.0000 mL | Freq: Once | INTRAVENOUS | Status: AC
  Administered 2012-05-25: 500 mL via INTRAVENOUS

## 2012-05-25 MED ORDER — ROCURONIUM BROMIDE 50 MG/5ML IV SOLN
INTRAVENOUS | Status: AC
  Filled 2012-05-25: qty 2

## 2012-05-25 MED ORDER — SODIUM CHLORIDE 0.9 % IV SOLN
INTRAVENOUS | Status: DC
  Administered 2012-05-25 – 2012-05-27 (×4): via INTRAVENOUS

## 2012-05-25 MED ORDER — SODIUM CHLORIDE 0.9 % IV SOLN
1.0000 mg/h | INTRAVENOUS | Status: DC
  Filled 2012-05-25: qty 10

## 2012-05-25 MED ORDER — SODIUM CHLORIDE 0.9 % IV SOLN
25.0000 ug/h | INTRAVENOUS | Status: DC
  Filled 2012-05-25: qty 50

## 2012-05-25 MED ORDER — SUCCINYLCHOLINE CHLORIDE 20 MG/ML IJ SOLN
INTRAMUSCULAR | Status: AC
  Administered 2012-05-25: 150 mg
  Filled 2012-05-25: qty 5

## 2012-05-25 MED ORDER — ETOMIDATE 2 MG/ML IV SOLN
INTRAVENOUS | Status: AC
  Administered 2012-05-25: 20 mg
  Filled 2012-05-25: qty 20

## 2012-05-25 MED ORDER — ACTIDOSE WITH SORBITOL 50 GM/240ML PO LIQD
50.0000 g | Freq: Once | ORAL | Status: AC
  Administered 2012-05-25: 50 g
  Filled 2012-05-25: qty 240

## 2012-05-25 MED ORDER — LIDOCAINE HCL (CARDIAC) 20 MG/ML IV SOLN
INTRAVENOUS | Status: AC
  Filled 2012-05-25: qty 5

## 2012-05-25 MED ORDER — SODIUM CHLORIDE 0.9 % IV SOLN
1000.0000 mL | INTRAVENOUS | Status: DC
  Administered 2012-05-25 (×3): 1000 mL via INTRAVENOUS

## 2012-05-25 MED ORDER — HEPARIN SODIUM (PORCINE) 5000 UNIT/ML IJ SOLN
5000.0000 [IU] | Freq: Three times a day (TID) | INTRAMUSCULAR | Status: DC
  Administered 2012-05-25 – 2012-05-29 (×7): 5000 [IU] via SUBCUTANEOUS
  Filled 2012-05-25 (×18): qty 1

## 2012-05-25 MED ORDER — SODIUM CHLORIDE 0.9 % IV BOLUS (SEPSIS)
1000.0000 mL | Freq: Once | INTRAVENOUS | Status: AC
  Administered 2012-05-25: 1000 mL via INTRAVENOUS

## 2012-05-25 MED ORDER — PANTOPRAZOLE SODIUM 40 MG IV SOLR
40.0000 mg | INTRAVENOUS | Status: DC
  Administered 2012-05-26: 40 mg via INTRAVENOUS
  Filled 2012-05-25 (×3): qty 40

## 2012-05-25 NOTE — ED Notes (Signed)
MD at bedside. 

## 2012-05-25 NOTE — ED Notes (Addendum)
RT,MD, RNx2, EMT, NA at bedside at bedside. Pt preoxygenated. crash cart, airway cart, suction set up

## 2012-05-25 NOTE — ED Notes (Signed)
Dr. Molli Knock gave verbal that he did want patient to be [facility] and not [facility]. [facility] order was put in by the Dr. Molli Knock inadvertently.
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
## 2012-05-25 NOTE — ED Notes (Signed)
OZH:YQMV<HQ> Expected date:05/25/12<BR> Expected time:12:48 PM<BR> Means of arrival:Ambulance<BR> Comments:<BR> SI/ overdose
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
## 2012-05-26 ENCOUNTER — Inpatient Hospital Stay (HOSPITAL_COMMUNITY): Payer: Self-pay

## 2012-05-26 ENCOUNTER — Encounter (HOSPITAL_COMMUNITY): Payer: Self-pay | Admitting: *Deleted

## 2012-05-26 DIAGNOSIS — R4182 Altered mental status, unspecified: Secondary | ICD-10-CM

## 2012-05-26 DIAGNOSIS — R404 Transient alteration of awareness: Secondary | ICD-10-CM

## 2012-05-26 DIAGNOSIS — T50901A Poisoning by unspecified drugs, medicaments and biological substances, accidental (unintentional), initial encounter: Secondary | ICD-10-CM

## 2012-05-26 DIAGNOSIS — J9600 Acute respiratory failure, unspecified whether with hypoxia or hypercapnia: Secondary | ICD-10-CM

## 2012-05-26 LAB — URINE CULTURE: Culture: NO GROWTH

## 2012-05-26 LAB — CORTISOL: Cortisol, Plasma: 2.2 ug/dL

## 2012-05-26 LAB — BASIC METABOLIC PANEL
BUN: 13 mg/dL (ref 6–23)
CO2: 23 mEq/L (ref 19–32)
Calcium: 7.7 mg/dL — ABNORMAL LOW (ref 8.4–10.5)
Creatinine, Ser: 0.9 mg/dL (ref 0.50–1.35)
GFR calc Af Amer: >90 mL/min (ref >90)

## 2012-05-26 LAB — CBC
HCT: 34.7 % — ABNORMAL LOW (ref 39.0–52.0)
MCH: 31.8 pg (ref 26.0–34.0)
MCV: 92.8 fL (ref 78.0–100.0)
Platelets: 207 10*3/uL (ref 150–400)
RDW: 13.1 % (ref 11.5–15.5)

## 2012-05-26 LAB — GLUCOSE, CAPILLARY: Glucose-Capillary: 79 mg/dL (ref 70–99)

## 2012-05-26 LAB — MAGNESIUM: Magnesium: 1.8 mg/dL (ref 1.5–2.5)

## 2012-05-26 LAB — TROPONIN I: Troponin I: <0.3 ng/mL (ref <0.30)

## 2012-05-26 MED ORDER — OXYCODONE HCL 5 MG PO TABS
5.0000 mg | ORAL_TABLET | ORAL | Status: DC | PRN
  Administered 2012-05-26 – 2012-05-30 (×13): 5 mg via ORAL
  Filled 2012-05-26 (×13): qty 1

## 2012-05-26 MED ORDER — MUPIROCIN 2 % EX OINT
1.0000 "application " | TOPICAL_OINTMENT | Freq: Two times a day (BID) | CUTANEOUS | Status: DC
  Administered 2012-05-26 – 2012-05-30 (×6): 1 via NASAL
  Filled 2012-05-26 (×2): qty 22

## 2012-05-26 MED ORDER — CHLORHEXIDINE GLUCONATE CLOTH 2 % EX PADS
6.0000 | MEDICATED_PAD | Freq: Every day | CUTANEOUS | Status: DC
  Administered 2012-05-26 – 2012-05-27 (×2): 6 via TOPICAL

## 2012-05-26 MED ORDER — CHLORHEXIDINE GLUCONATE 0.12 % MT SOLN
OROMUCOSAL | Status: AC
  Administered 2012-05-26: 09:00:00
  Filled 2012-05-26: qty 15

## 2012-05-26 MED ORDER — SODIUM CHLORIDE 0.9 % IV BOLUS (SEPSIS)
500.0000 mL | Freq: Once | INTRAVENOUS | Status: AC
  Administered 2012-05-26: 500 mL via INTRAVENOUS

## 2012-05-26 MED ORDER — ACETAMINOPHEN 325 MG PO TABS: 650.0000 mg | ORAL_TABLET | Freq: Four times a day (QID) | ORAL | Status: DC | PRN

## 2012-05-26 MED ORDER — FENTANYL CITRATE 0.05 MG/ML IJ SOLN
50.0000 ug | INTRAMUSCULAR | Status: DC | PRN
  Administered 2012-05-26: 50 ug via INTRAVENOUS
  Administered 2012-05-26 (×2): 100 ug via INTRAVENOUS
  Filled 2012-05-26 (×3): qty 2

## 2012-05-26 MED ORDER — LORAZEPAM 2 MG/ML IJ SOLN
1.0000 mg | INTRAMUSCULAR | Status: DC | PRN
  Administered 2012-05-26 – 2012-05-27 (×4): 2 mg via INTRAVENOUS
  Filled 2012-05-26 (×4): qty 1

## 2012-05-26 NOTE — Progress Notes (Signed)
Pt extubated to 2L [place] @0805 .  Hr 76, bp 112/91, rr 27, sat 100%.  No stridor, no distress.
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
## 2012-05-26 NOTE — Progress Notes (Addendum)
Clinical Social Work Department BRIEF PSYCHOSOCIAL ASSESSMENT 05/26/2012  Patient:  Brandon [name], Brandon [name]     Account Number:  [id number]     Admit date:  05/25/2012  Clinical Social Worker:  Doroteo Glassman  Date/Time:  05/26/2012 09:27 AM  Referred by:  Physician  Date Referred:  05/26/2012 Referred for  Psychosocial assessment   Other Referral:   Interview type:  Patient Other interview type:    PSYCHOSOCIAL DATA Living Status:  FAMILY Admitted from facility:   Level of care:   Primary support name:  Parents Primary support relationship to patient:  PARENT Degree of support available:   unknown    CURRENT CONCERNS Current Concerns  Behavioral Health Issues   Other Concerns:    SOCIAL WORK ASSESSMENT / PLAN Contacted by ICU to draw up IVC papers on Pt who was wanting to leave AMA.    Discussed situation with MD.  MD's preference is to not IVC Pt, at this time, and allow that recommendation to come from psych MD.  Nursing staff and MD stating, though, that Pt unwilling to remain in the hospital to await psych MD.    Met with Pt to discuss current admission.  Pt was, initially, unwilling to discuss the events surrounding his admission.  He eventually told CSW that his ex-girlfriend equivocates about wanting to be with him vs another man and that this has been going on since September.  Pt was tearful while discussing how the girlfriend only comes around when she's having pxs with her other boyfriend.  He frequently professed his love for her and that he's "nothing" without her.    Pt reported that he's been hospitalized numerous times and that he's seen many psychiatrists.  He stated that there's nothing anyone can do for him.  Pt stated that he was living with his parents prior to being hospitalized, however he's unsure if he can go back, as he stated that he destroyed his room.    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 Prior to this, he was unemployed.  Pt stated that he's well educated and that he has a "genius IQ."    CSW and Pt discussed Pt's willingness to remain at [facility] until the psych MD can assess him.  Pt acquiesced, stating that he really has no choice.    CSW thanked Pt for his time and his willingness to allow the psych MD to assess him.    CSW notified MD and RN.    CSW to continue to follow.   Assessment/plan status:  Psychosocial Support/Ongoing Assessment of Needs Other assessment/ plan:   Information/referral to community resources:   n/a    PATIENT'S/FAMILY'S RESPONSE TO PLAN OF CARE: Pt thanked CSW for trying to help him.   Providence Crosby, LCSWA Clinical Social Work [phone]

## 2012-05-26 NOTE — Progress Notes (Signed)
eLink Physician-Brief Progress Note Patient Name: Brandon [name]. DOB: [birth year] MRN: [id number]  Date of Service  05/26/2012   HPI/Events of Note  Gradual drift down in BP over past few hours with current BP of 90/48 (61).  HR is 62.   eICU Interventions  Plan: 500 cc bolus of NS for BP support   Intervention Category Intermediate Interventions: Hypotension - evaluation and management  Mayuri Staples 05/26/2012, 5:18 AM

## 2012-05-26 NOTE — Progress Notes (Signed)
Involuntary Commitment papers served by police department.Pt tearful and agitated.  Ex-girlfriend, sitter and RN present.

## 2012-05-26 NOTE — Consult Note (Signed)
Reason for Consult: AMS Referring Physician: unknown  Brandon [name]. is an 47 y.o. male.  HPI:   Brandon [name]. is a 47 y.o. Male who presents, by EMS, to be evaluated for an overdose.  He reportedly ingested multiple doses of Xanax, Klonopin, and Geodon. EMS arrived to his home, about 30 minutes after the ingestion. At that time, they did not have access to the patient, because he had barricaded himself inside his bedroom. Please officers arrived and it took some time and effort to remove him from his room, and bring him here. The patient's resistance caused the least to use if worse, including Tazer- multiple shots, beanbag projectiles, and physical force. He arrived handcuffed. He was intubated before.   Reports this OD as attempt to kill himself. U[set on break up with her girlfriend. Has financial issues now. Very hopeless and labile right now. Able to talk but confused.  Very poor historian now and reports this OD happened because girl friend refused to be with him recently.   Past Medical History  Diagnosis Date  . Rhabdomyolysis   . Acute renal failure   . Opiate abuse, continuous   . Dental caries     extensive  . Anxiety   . Depression    Hx of Bipolar d/o Past Surgical History  Procedure Laterality Date  . Knee surgery      History reviewed. No pertinent family history.  Social History:  reports that he has never smoked. He does not have any smokeless tobacco history on file. He reports that he uses illicit drugs (Oxycodone, Amphetamines, Barbituates, and Methamphetamines). He reports that he does not drink alcohol.  Allergies: No Known Allergies  Medications: I have reviewed the patient's current medications.  Results for orders placed during the hospital encounter of 05/25/12 (from the past 48 hour(s))  COMPREHENSIVE METABOLIC PANEL     Status: Abnormal   Collection Time    05/25/12  1:14 PM      Result Value Range   Sodium 134 (*) 135 - 145 mEq/L    Potassium 4.1  3.5 - 5.1 mEq/L   Chloride 98  96 - 112 mEq/L   CO2 25  19 - 32 mEq/L   Glucose, Bld 119 (*) 70 - 99 mg/dL   BUN 18  6 - 23 mg/dL   Creatinine, Ser 1.19  0.50 - 1.35 mg/dL   Calcium 9.3  8.4 - 14.7 mg/dL   Total Protein 7.3  6.0 - 8.3 g/dL   Albumin 4.0  3.5 - 5.2 g/dL   AST 27  0 - 37 U/L   ALT 32  0 - 53 U/L   Alkaline Phosphatase 55  39 - 117 U/L   Total Bilirubin 0.5  0.3 - 1.2 mg/dL   GFR calc non Af Amer 67 (*) >90 mL/min   GFR calc Af Amer 78 (*) >90 mL/min   Comment:            The eGFR has been calculated     using the CKD EPI equation.     This calculation has not been     validated in all clinical     situations.     eGFR's persistently     <90 mL/min signify     possible Chronic Kidney Disease.  ETHANOL     Status: None   Collection Time    05/25/12  1:14 PM      Result Value Range   Alcohol,  Ethyl (B) <11  0 - 11 mg/dL   Comment:            LOWEST DETECTABLE LIMIT FOR     SERUM ALCOHOL IS 11 mg/dL     FOR MEDICAL PURPOSES ONLY  SALICYLATE LEVEL     Status: Abnormal   Collection Time    05/25/12  1:14 PM      Result Value Range   Salicylate Lvl <2.0 (*) 2.8 - 20.0 mg/dL  ACETAMINOPHEN LEVEL     Status: None   Collection Time    05/25/12  1:14 PM      Result Value Range   Acetaminophen (Tylenol), Serum <15.0  10 - 30 ug/mL   Comment:            THERAPEUTIC CONCENTRATIONS VARY     SIGNIFICANTLY. A RANGE OF 10-30     ug/mL MAY BE AN EFFECTIVE     CONCENTRATION FOR MANY PATIENTS.     HOWEVER, SOME ARE BEST TREATED     AT CONCENTRATIONS OUTSIDE THIS     RANGE.     ACETAMINOPHEN CONCENTRATIONS     >150 ug/mL AT 4 HOURS AFTER     INGESTION AND >50 ug/mL AT 12     HOURS AFTER INGESTION ARE     OFTEN ASSOCIATED WITH TOXIC     REACTIONS.  AMMONIA     Status: None   Collection Time    05/25/12  1:15 PM      Result Value Range   Ammonia 40  11 - 60 umol/L  GLUCOSE, CAPILLARY     Status: Abnormal   Collection Time    05/25/12  1:19 PM       Result Value Range   Glucose-Capillary 114 (*) 70 - 99 mg/dL  URINE RAPID DRUG SCREEN (HOSP PERFORMED)     Status: Abnormal   Collection Time    05/25/12  1:34 PM      Result Value Range   Opiates NONE DETECTED  NONE DETECTED   Cocaine NONE DETECTED  NONE DETECTED   Benzodiazepines POSITIVE (*) NONE DETECTED   Amphetamines POSITIVE (*) NONE DETECTED   Tetrahydrocannabinol NONE DETECTED  NONE DETECTED   Barbiturates NONE DETECTED  NONE DETECTED   Comment:            DRUG SCREEN FOR MEDICAL PURPOSES     ONLY.  IF CONFIRMATION IS NEEDED     FOR ANY PURPOSE, NOTIFY LAB     WITHIN 5 DAYS.                LOWEST DETECTABLE LIMITS     FOR URINE DRUG SCREEN     Drug Class       Cutoff (ng/mL)     Amphetamine      1000     Barbiturate      200     Benzodiazepine   200     Tricyclics       300     Opiates          300     Cocaine          300     THC              50  URINE CULTURE     Status: None   Collection Time    05/25/12  1:34 PM      Result Value Range   Specimen Description URINE, CATHETERIZED     Special Requests  NONE     Culture  Setup Time 05/25/2012 17:57     Colony Count NO GROWTH     Culture NO GROWTH     Report Status 05/26/2012 FINAL    URINALYSIS, ROUTINE W REFLEX MICROSCOPIC     Status: Abnormal   Collection Time    05/25/12  1:35 PM      Result Value Range   Color, Urine YELLOW  YELLOW   APPearance CLEAR  CLEAR   Specific Gravity, Urine 1.019  1.005 - 1.030   pH 7.0  5.0 - 8.0   Glucose, UA NEGATIVE  NEGATIVE mg/dL   Hgb urine dipstick NEGATIVE  NEGATIVE   Bilirubin Urine NEGATIVE  NEGATIVE   Ketones, ur NEGATIVE  NEGATIVE mg/dL   Protein, ur 30 (*) NEGATIVE mg/dL   Urobilinogen, UA 1.0  0.0 - 1.0 mg/dL   Nitrite NEGATIVE  NEGATIVE   Leukocytes, UA NEGATIVE  NEGATIVE  URINE MICROSCOPIC-ADD ON     Status: Abnormal   Collection Time    05/25/12  1:35 PM      Result Value Range   Squamous Epithelial / LPF RARE  RARE   WBC, UA 0-2  <3 WBC/hpf    Bacteria, UA FEW (*) RARE  LACTIC ACID, PLASMA     Status: None   Collection Time    05/25/12  1:51 PM      Result Value Range   Lactic Acid, Venous 0.7  0.5 - 2.2 mmol/L  BLOOD GAS, ARTERIAL     Status: Abnormal   Collection Time    05/25/12  5:00 PM      Result Value Range   FIO2 0.50     Delivery systems VENTILATOR     Mode PRESSURE REGULATED VOLUME CONTROL     VT 600     Rate 14     Peep/cpap 5.0     pH, Arterial 7.349 (*) 7.350 - 7.450   pCO2 arterial 42.0  35.0 - 45.0 mmHg   pO2, Arterial 166.0 (*) 80.0 - 100.0 mmHg   Bicarbonate 22.5  20.0 - 24.0 mEq/L   TCO2 20.4  0 - 100 mmol/L   Acid-base deficit 2.5 (*) 0.0 - 2.0 mmol/L   O2 Saturation 98.9     Patient temperature 98.6     Collection site RIGHT RADIAL     Drawn by [phone]     Sample type ARTERIAL DRAW     Allens test (pass/fail) PASS  PASS  BLOOD GAS, ARTERIAL     Status: Abnormal   Collection Time    05/25/12  6:10 PM      Result Value Range   FIO2 0.30     Delivery systems VENTILATOR     Mode PRESSURE REGULATED VOLUME CONTROL     VT 640     Rate 14     Peep/cpap 5.0     pH, Arterial 7.394  7.350 - 7.450   pCO2 arterial 36.7  35.0 - 45.0 mmHg   pO2, Arterial 119.0 (*) 80.0 - 100.0 mmHg   Bicarbonate 21.9  20.0 - 24.0 mEq/L   TCO2 19.6  0 - 100 mmol/L   Acid-base deficit 2.0  0.0 - 2.0 mmol/L   O2 Saturation 98.3     Patient temperature 98.6     Collection site RIGHT RADIAL     Drawn by [id number]     Sample type ARTERIAL DRAW     Allens test (pass/fail) PASS  PASS  APTT  Status: None   Collection Time    05/25/12  6:25 PM      Result Value Range   aPTT 32  24 - 37 seconds  PROTIME-INR     Status: None   Collection Time    05/25/12  6:25 PM      Result Value Range   Prothrombin Time 13.6  11.6 - 15.2 seconds   INR 1.05  0.00 - 1.49  LACTIC ACID, PLASMA     Status: None   Collection Time    05/25/12  6:25 PM      Result Value Range   Lactic Acid, Venous 1.1  0.5 - 2.2 mmol/L  CORTISOL      Status: None   Collection Time    05/25/12  6:25 PM      Result Value Range   Cortisol, Plasma 2.2     Comment: (NOTE)     AM:  4.3 - 22.4 ug/dL     PM:  3.1 - 40.9 ug/dL  PRO B NATRIURETIC PEPTIDE     Status: None   Collection Time    05/25/12  6:25 PM      Result Value Range   Pro B Natriuretic peptide (BNP) 43.1  0 - 125 pg/mL  MRSA PCR SCREENING     Status: Abnormal   Collection Time    05/25/12  6:47 PM      Result Value Range   MRSA by PCR POSITIVE (*) NEGATIVE   Comment:            The GeneXpert MRSA Assay (FDA     approved for NASAL specimens     only), is one component of a     comprehensive MRSA colonization     surveillance program. It is not     intended to diagnose MRSA     infection nor to guide or     monitor treatment for     MRSA infections.     RESULT CALLED TO, READ BACK BY AND VERIFIED WITH:     REEVES,M.RN @0152  05/26/12 WELLS,D.  GLUCOSE, CAPILLARY     Status: None   Collection Time    05/25/12 10:43 PM      Result Value Range   Glucose-Capillary 80  70 - 99 mg/dL   Comment 1 Documented in Chart     Comment 2 Notify RN    SALICYLATE LEVEL     Status: Abnormal   Collection Time    05/25/12 10:45 PM      Result Value Range   Salicylate Lvl <2.0 (*) 2.8 - 20.0 mg/dL  ACETAMINOPHEN LEVEL     Status: None   Collection Time    05/25/12 10:45 PM      Result Value Range   Acetaminophen (Tylenol), Serum <15.0  10 - 30 ug/mL   Comment:            THERAPEUTIC CONCENTRATIONS VARY     SIGNIFICANTLY. A RANGE OF 10-30     ug/mL MAY BE AN EFFECTIVE     CONCENTRATION FOR MANY PATIENTS.     HOWEVER, SOME ARE BEST TREATED     AT CONCENTRATIONS OUTSIDE THIS     RANGE.     ACETAMINOPHEN CONCENTRATIONS     >150 ug/mL AT 4 HOURS AFTER     INGESTION AND >50 ug/mL AT 12     HOURS AFTER INGESTION ARE     OFTEN ASSOCIATED WITH TOXIC     REACTIONS.  CBC  Status: Abnormal   Collection Time    05/25/12 10:45 PM      Result Value Range   WBC 5.3  4.0 - 10.5  K/uL   RBC 3.82 (*) 4.22 - 5.81 MIL/uL   Hemoglobin 12.2 (*) 13.0 - 17.0 g/dL   HCT 16.1 (*) 09.6 - 04.5 %   MCV 92.9  78.0 - 100.0 fL   MCH 31.9  26.0 - 34.0 pg   MCHC 34.4  30.0 - 36.0 g/dL   RDW 40.9  81.1 - 91.4 %   Platelets 213  150 - 400 K/uL  BASIC METABOLIC PANEL     Status: Abnormal   Collection Time    05/25/12 10:45 PM      Result Value Range   Sodium 139  135 - 145 mEq/L   Potassium 3.6  3.5 - 5.1 mEq/L   Chloride 107  96 - 112 mEq/L   Comment: DELTA CHECK NOTED     REPEATED TO VERIFY   CO2 22  19 - 32 mEq/L   Glucose, Bld 90  70 - 99 mg/dL   BUN 14  6 - 23 mg/dL   Creatinine, Ser 7.82  0.50 - 1.35 mg/dL   Calcium 7.9 (*) 8.4 - 10.5 mg/dL   GFR calc non Af Amer >90  >90 mL/min   GFR calc Af Amer >90  >90 mL/min   Comment:            The eGFR has been calculated     using the CKD EPI equation.     This calculation has not been     validated in all clinical     situations.     eGFR's persistently     <90 mL/min signify     possible Chronic Kidney Disease.  MAGNESIUM     Status: None   Collection Time    05/25/12 10:45 PM      Result Value Range   Magnesium 1.9  1.5 - 2.5 mg/dL  TROPONIN I     Status: None   Collection Time    05/25/12 10:45 PM      Result Value Range   Troponin I <0.30  <0.30 ng/mL   Comment:            Due to the release kinetics of cTnI,     a negative result within the first hours     of the onset of symptoms does not rule out     myocardial infarction with certainty.     If myocardial infarction is still suspected,     repeat the test at appropriate intervals.  CK     Status: None   Collection Time    05/25/12 10:45 PM      Result Value Range   Total CK 166  7 - 232 U/L  GLUCOSE, CAPILLARY     Status: None   Collection Time    05/25/12 11:28 PM      Result Value Range   Glucose-Capillary 79  70 - 99 mg/dL  GLUCOSE, CAPILLARY     Status: None   Collection Time    05/26/12  3:18 AM      Result Value Range    Glucose-Capillary 88  70 - 99 mg/dL  CBC     Status: Abnormal   Collection Time    05/26/12  3:41 AM      Result Value Range   WBC 5.7  4.0 - 10.5 K/uL  RBC 3.74 (*) 4.22 - 5.81 MIL/uL   Hemoglobin 11.9 (*) 13.0 - 17.0 g/dL   HCT 16.1 (*) 09.6 - 04.5 %   MCV 92.8  78.0 - 100.0 fL   MCH 31.8  26.0 - 34.0 pg   MCHC 34.3  30.0 - 36.0 g/dL   RDW 40.9  81.1 - 91.4 %   Platelets 207  150 - 400 K/uL  BASIC METABOLIC PANEL     Status: Abnormal   Collection Time    05/26/12  3:41 AM      Result Value Range   Sodium 137  135 - 145 mEq/L   Potassium 3.6  3.5 - 5.1 mEq/L   Chloride 107  96 - 112 mEq/L   CO2 23  19 - 32 mEq/L   Glucose, Bld 84  70 - 99 mg/dL   BUN 13  6 - 23 mg/dL   Creatinine, Ser 7.82  0.50 - 1.35 mg/dL   Calcium 7.7 (*) 8.4 - 10.5 mg/dL   GFR calc non Af Amer >90  >90 mL/min   GFR calc Af Amer >90  >90 mL/min   Comment:            The eGFR has been calculated     using the CKD EPI equation.     This calculation has not been     validated in all clinical     situations.     eGFR's persistently     <90 mL/min signify     possible Chronic Kidney Disease.  MAGNESIUM     Status: None   Collection Time    05/26/12  3:41 AM      Result Value Range   Magnesium 1.8  1.5 - 2.5 mg/dL  PHOSPHORUS     Status: None   Collection Time    05/26/12  3:41 AM      Result Value Range   Phosphorus 2.8  2.3 - 4.6 mg/dL  TROPONIN I     Status: None   Collection Time    05/26/12  3:41 AM      Result Value Range   Troponin I <0.30  <0.30 ng/mL   Comment:            Due to the release kinetics of cTnI,     a negative result within the first hours     of the onset of symptoms does not rule out     myocardial infarction with certainty.     If myocardial infarction is still suspected,     repeat the test at appropriate intervals.    Ct Head Wo Contrast  05/26/2012  *RADIOLOGY REPORT*  Clinical Data:  Altered mental status.  CT HEAD WITHOUT CONTRAST CT CERVICAL SPINE WITHOUT  CONTRAST  Technique:  Multidetector CT imaging of the head and cervical spine was performed following the standard protocol without intravenous contrast.  Multiplanar CT image reconstructions of the cervical spine were also generated.  Comparison:  No priors.  CT HEAD  Findings: No acute intracranial abnormalities.  Specifically, no evidence of acute intracranial hemorrhage, no definite findings of acute/subacute cerebral ischemia, no mass, mass effect, hydrocephalus or abnormal intra or extra-axial fluid collections. Visualized paranasal sinuses and mastoids are well pneumatized.  No acute displaced skull fractures are identified.  IMPRESSION: 1.  No acute intracranial abnormalities. 2.  The appearance of the brain is normal.  CT CERVICAL SPINE  Findings: The patient is intubated.  No acute displaced cervical spine fractures.  Mild reversal normal cervical lordosis centered at the level of C4, favored to be positional.  Because of the presence of endotracheal and nasogastric tube, accurate assessment for prevertebral soft tissue swelling is not possible on this examination.  Notably, the nasogastric tube appears to be partially coiled in the pharynx.  IMPRESSION: 1.  No evidence of significant acute traumatic injury to the cervical spine. 2.  Support apparatus, as above.  The nasogastric tube appears partially coiled within the pharynx.   Original Report Authenticated By: Trudie Reed, M.D.    Ct Cervical Spine Wo Contrast  05/26/2012  *RADIOLOGY REPORT*  Clinical Data:  Altered mental status.  CT HEAD WITHOUT CONTRAST CT CERVICAL SPINE WITHOUT CONTRAST  Technique:  Multidetector CT imaging of the head and cervical spine was performed following the standard protocol without intravenous contrast.  Multiplanar CT image reconstructions of the cervical spine were also generated.  Comparison:  No priors.  CT HEAD  Findings: No acute intracranial abnormalities.  Specifically, no evidence of acute intracranial  hemorrhage, no definite findings of acute/subacute cerebral ischemia, no mass, mass effect, hydrocephalus or abnormal intra or extra-axial fluid collections. Visualized paranasal sinuses and mastoids are well pneumatized.  No acute displaced skull fractures are identified.  IMPRESSION: 1.  No acute intracranial abnormalities. 2.  The appearance of the brain is normal.  CT CERVICAL SPINE  Findings: The patient is intubated.  No acute displaced cervical spine fractures.  Mild reversal normal cervical lordosis centered at the level of C4, favored to be positional.  Because of the presence of endotracheal and nasogastric tube, accurate assessment for prevertebral soft tissue swelling is not possible on this examination.  Notably, the nasogastric tube appears to be partially coiled in the pharynx.  IMPRESSION: 1.  No evidence of significant acute traumatic injury to the cervical spine. 2.  Support apparatus, as above.  The nasogastric tube appears partially coiled within the pharynx.   Original Report Authenticated By: Trudie Reed, M.D.    Dg Chest Port 1 View  05/26/2012  *RADIOLOGY REPORT*  Clinical Data: Intubated.  Evaluate endotracheal tube.  PORTABLE CHEST - 1 VIEW  Comparison: Chest x-ray 05/25/2012.  Findings: An endotracheal tube is in place with tip 5.3 cm above the carina.  Nasogastric tube is seen extending in the proximal stomach, with the side port near the gastroesophageal junction. Lung volumes are low.  There are bibasilar opacities which are linear in appearance most compatible with subsegmental atelectasis. No definite pleural effusions.  Mild crowding of the pulmonary vasculature, without frank pulmonary edema.  Heart size is normal. The patient is rotated to the left on today's exam, resulting in distortion of the mediastinal contours and reduced diagnostic sensitivity and specificity for mediastinal pathology.  IMPRESSION: 1.  Support apparatus, as above. 2.  Low volumes with bibasilar  subsegmental atelectasis.   Original Report Authenticated By: Trudie Reed, M.D.    Dg Chest Portable 1 View  05/25/2012  *RADIOLOGY REPORT*  Clinical Data: Intubation  PORTABLE CHEST - 1 VIEW  Comparison: 05/25/2012  Findings: Endotracheal tube 4.2 cm above the carina.  NG tube enters the stomach below the hemidiaphragms with the tip not visualized.  Low lung volumes noted with central vascular congestion and increased basilar atelectasis.  No large effusion or pneumothorax.  Stable cardiac enlargement.  IMPRESSION: Endotracheal tube 4.2 cm above the carina.  Vascular congestion with increased atelectasis   Original Report Authenticated By: Judie Petit. Miles Costain, M.D.    Dg Chest Port 1 View  05/25/2012  *  RADIOLOGY REPORT*  Clinical Data: Overdose.  Unresponsive.  PORTABLE CHEST - 1 VIEW  Comparison: Previous examinations, the most recent dated 12/06/2011.  Findings: Borderline enlarged cardiac silhouette.  Poor inspiration with minimal bibasilar atelectasis.  Thoracic spine degenerative changes.  IMPRESSION: Poor inspiration with minimal bibasilar atelectasis.   Original Report Authenticated By: Beckie Salts, M.D.     ROS Blood pressure 149/93, pulse 86, temperature 97.9 F (36.6 C), temperature source Oral, resp. rate 15, height 6\' 1"  (1.854 m), weight 109.4 kg (241 lb 2.9 oz), SpO2 96.00%. Physical Exam  Mental Status Examination/Evaluation:   Appearance: on bed confused   Eye Contact::  poor   Speech: pressured   Volume: low   Mood: no answer   Affect: ristricted   Thought Process: organized   Orientation: 1/4 place and person   Thought Content: denies AVH   Suicidal Thoughts: No   Homicidal Thoughts: no   Memory: Recent; Poor   Judgement: Impaired   Insight: Lacking   Psychomotor Activity: slow   Concentration: poor   Recall: poor   Akathisia: No   Assessment:   AXIS I: Delirium  NOS, hx of bipolar d/o, hx of cocaine abuse AXIS II:  Deferred   AXIS III: see emdical hx ?     AXIS IV: conflict with girlfriend   AXIS V: 15   ?   Treatment Plan/Recommendations:   - Likley delirium at this time  - will recommend to continie IVC due to his AMS, and safety  - will continue to evaluate and start meds if needed later  Wonda Cerise 05/26/2012, 8:05 PM

## 2012-05-27 LAB — CBC
Hemoglobin: 12.6 g/dL — ABNORMAL LOW (ref 13.0–17.0)
MCHC: 34.9 g/dL (ref 30.0–36.0)
RBC: 3.95 MIL/uL — ABNORMAL LOW (ref 4.22–5.81)
WBC: 5.4 10*3/uL (ref 4.0–10.5)

## 2012-05-27 LAB — BASIC METABOLIC PANEL
BUN: 9 mg/dL (ref 6–23)
GFR calc Af Amer: >90 mL/min (ref >90)
GFR calc non Af Amer: >90 mL/min (ref >90)
Potassium: 3.8 mEq/L (ref 3.5–5.1)
Sodium: 140 mEq/L (ref 135–145)

## 2012-05-27 MED ORDER — ZOLPIDEM TARTRATE 10 MG PO TABS
10.0000 mg | ORAL_TABLET | Freq: Once | ORAL | Status: AC
  Administered 2012-05-27: 10 mg via ORAL
  Filled 2012-05-27: qty 1

## 2012-05-27 MED ORDER — LORAZEPAM 0.5 MG PO TABS
0.5000 mg | ORAL_TABLET | Freq: Four times a day (QID) | ORAL | Status: DC | PRN
  Administered 2012-05-27 – 2012-05-28 (×4): 0.5 mg via ORAL
  Filled 2012-05-27 (×4): qty 1

## 2012-05-27 MED ORDER — ZOLPIDEM TARTRATE 5 MG PO TABS
5.0000 mg | ORAL_TABLET | Freq: Every evening | ORAL | Status: DC | PRN
  Administered 2012-05-28 – 2012-05-29 (×3): 5 mg via ORAL
  Filled 2012-05-27 (×3): qty 1

## 2012-05-27 MED ORDER — ALPRAZOLAM 0.5 MG PO TABS
0.5000 mg | ORAL_TABLET | Freq: Every evening | ORAL | Status: DC | PRN
  Administered 2012-05-27 – 2012-05-30 (×6): 0.5 mg via ORAL
  Filled 2012-05-27 (×6): qty 1
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
## 2012-05-27 NOTE — Consult Note (Signed)
Reason for Consult: AMS Referring Physician: unknown  Brandon [name]. is an 47 y.o. male.  HPI:   Brandon [name]. is a 47 y.o. Male who presents, by EMS, to be evaluated for an overdose.  He reportedly ingested multiple doses of Xanax, Klonopin, and Geodon. EMS arrived to his home, about 30 minutes after the ingestion. At that time, they did not have access to the patient, because he had barricaded himself inside his bedroom. Please officers arrived and it took some time and effort to remove him from his room, and bring him here. The patient's resistance caused the least to use if worse, including Tazer- multiple shots, beanbag projectiles, and physical force. He arrived handcuffed. He was intubated before.    Interval hx:   More talkative today but very labile and making unrealistic plans now. Had argument with her girl friend today. Reports this OD as attempt to kill himself.  Has financial issues now. Very hopeless now as his girl friend can not live with him as he does not have her own space. Able to talk but confused.   reports this OD happened because girl friend refused to be with him recently. Per pt he was taking Geodon before coming here and he OD on only Geodon but per chart it is not true. UDS is positive for Benzo too.   Past Medical History  Diagnosis Date  . Rhabdomyolysis   . Acute renal failure   . Opiate abuse, continuous   . Dental caries     extensive  . Anxiety   . Depression    Hx of Bipolar d/o Past Surgical History  Procedure Laterality Date  . Knee surgery      History reviewed. No pertinent family history.  Social History:  reports that he has never smoked. He does not have any smokeless tobacco history on file. He reports that he uses illicit drugs (Oxycodone, Amphetamines, Barbituates, and Methamphetamines). He reports that he does not drink alcohol.  Allergies: No Known Allergies  Medications: I have reviewed the patient's current  medications.  Results for orders placed during the hospital encounter of 05/25/12 (from the past 48 hour(s))  BLOOD GAS, ARTERIAL     Status: Abnormal   Collection Time    05/25/12  5:00 PM      Result Value Range   FIO2 0.50     Delivery systems VENTILATOR     Mode PRESSURE REGULATED VOLUME CONTROL     VT 600     Rate 14     Peep/cpap 5.0     pH, Arterial 7.349 (*) 7.350 - 7.450   pCO2 arterial 42.0  35.0 - 45.0 mmHg   pO2, Arterial 166.0 (*) 80.0 - 100.0 mmHg   Bicarbonate 22.5  20.0 - 24.0 mEq/L   TCO2 20.4  0 - 100 mmol/L   Acid-base deficit 2.5 (*) 0.0 - 2.0 mmol/L   O2 Saturation 98.9     Patient temperature 98.6     Collection site RIGHT RADIAL     Drawn by [phone]     Sample type ARTERIAL DRAW     Allens test (pass/fail) PASS  PASS  BLOOD GAS, ARTERIAL     Status: Abnormal   Collection Time    05/25/12  6:10 PM      Result Value Range   FIO2 0.30     Delivery systems VENTILATOR     Mode PRESSURE REGULATED VOLUME CONTROL     VT 640  Rate 14     Peep/cpap 5.0     pH, Arterial 7.394  7.350 - 7.450   pCO2 arterial 36.7  35.0 - 45.0 mmHg   pO2, Arterial 119.0 (*) 80.0 - 100.0 mmHg   Bicarbonate 21.9  20.0 - 24.0 mEq/L   TCO2 19.6  0 - 100 mmol/L   Acid-base deficit 2.0  0.0 - 2.0 mmol/L   O2 Saturation 98.3     Patient temperature 98.6     Collection site RIGHT RADIAL     Drawn by [phone]     Sample type ARTERIAL DRAW     Allens test (pass/fail) PASS  PASS  APTT     Status: None   Collection Time    05/25/12  6:25 PM      Result Value Range   aPTT 32  24 - 37 seconds  PROTIME-INR     Status: None   Collection Time    05/25/12  6:25 PM      Result Value Range   Prothrombin Time 13.6  11.6 - 15.2 seconds   INR 1.05  0.00 - 1.49  LACTIC ACID, PLASMA     Status: None   Collection Time    05/25/12  6:25 PM      Result Value Range   Lactic Acid, Venous 1.1  0.5 - 2.2 mmol/L  CORTISOL     Status: None   Collection Time    05/25/12  6:25 PM      Result  Value Range   Cortisol, Plasma 2.2     Comment: (NOTE)     AM:  4.3 - 22.4 ug/dL     PM:  3.1 - 91.4 ug/dL  PRO B NATRIURETIC PEPTIDE     Status: None   Collection Time    05/25/12  6:25 PM      Result Value Range   Pro B Natriuretic peptide (BNP) 43.1  0 - 125 pg/mL  MRSA PCR SCREENING     Status: Abnormal   Collection Time    05/25/12  6:47 PM      Result Value Range   MRSA by PCR POSITIVE (*) NEGATIVE   Comment:            The GeneXpert MRSA Assay (FDA     approved for NASAL specimens     only), is one component of a     comprehensive MRSA colonization     surveillance program. It is not     intended to diagnose MRSA     infection nor to guide or     monitor treatment for     MRSA infections.     RESULT CALLED TO, READ BACK BY AND VERIFIED WITH:     REEVES,M.RN @0152  05/26/12 WELLS,D.  GLUCOSE, CAPILLARY     Status: None   Collection Time    05/25/12 10:43 PM      Result Value Range   Glucose-Capillary 80  70 - 99 mg/dL   Comment 1 Documented in Chart     Comment 2 Notify RN    SALICYLATE LEVEL     Status: Abnormal   Collection Time    05/25/12 10:45 PM      Result Value Range   Salicylate Lvl <2.0 (*) 2.8 - 20.0 mg/dL  ACETAMINOPHEN LEVEL     Status: None   Collection Time    05/25/12 10:45 PM      Result Value Range   Acetaminophen (Tylenol), Serum <15.0  10 - 30 ug/mL   Comment:            THERAPEUTIC CONCENTRATIONS VARY     SIGNIFICANTLY. A RANGE OF 10-30     ug/mL MAY BE AN EFFECTIVE     CONCENTRATION FOR MANY PATIENTS.     HOWEVER, SOME ARE BEST TREATED     AT CONCENTRATIONS OUTSIDE THIS     RANGE.     ACETAMINOPHEN CONCENTRATIONS     >150 ug/mL AT 4 HOURS AFTER     INGESTION AND >50 ug/mL AT 12     HOURS AFTER INGESTION ARE     OFTEN ASSOCIATED WITH TOXIC     REACTIONS.  CBC     Status: Abnormal   Collection Time    05/25/12 10:45 PM      Result Value Range   WBC 5.3  4.0 - 10.5 K/uL   RBC 3.82 (*) 4.22 - 5.81 MIL/uL   Hemoglobin 12.2 (*) 13.0  - 17.0 g/dL   HCT 16.1 (*) 09.6 - 04.5 %   MCV 92.9  78.0 - 100.0 fL   MCH 31.9  26.0 - 34.0 pg   MCHC 34.4  30.0 - 36.0 g/dL   RDW 40.9  81.1 - 91.4 %   Platelets 213  150 - 400 K/uL  BASIC METABOLIC PANEL     Status: Abnormal   Collection Time    05/25/12 10:45 PM      Result Value Range   Sodium 139  135 - 145 mEq/L   Potassium 3.6  3.5 - 5.1 mEq/L   Chloride 107  96 - 112 mEq/L   Comment: DELTA CHECK NOTED     REPEATED TO VERIFY   CO2 22  19 - 32 mEq/L   Glucose, Bld 90  70 - 99 mg/dL   BUN 14  6 - 23 mg/dL   Creatinine, Ser 7.82  0.50 - 1.35 mg/dL   Calcium 7.9 (*) 8.4 - 10.5 mg/dL   GFR calc non Af Amer >90  >90 mL/min   GFR calc Af Amer >90  >90 mL/min   Comment:            The eGFR has been calculated     using the CKD EPI equation.     This calculation has not been     validated in all clinical     situations.     eGFR's persistently     <90 mL/min signify     possible Chronic Kidney Disease.  MAGNESIUM     Status: None   Collection Time    05/25/12 10:45 PM      Result Value Range   Magnesium 1.9  1.5 - 2.5 mg/dL  TROPONIN I     Status: None   Collection Time    05/25/12 10:45 PM      Result Value Range   Troponin I <0.30  <0.30 ng/mL   Comment:            Due to the release kinetics of cTnI,     a negative result within the first hours     of the onset of symptoms does not rule out     myocardial infarction with certainty.     If myocardial infarction is still suspected,     repeat the test at appropriate intervals.  CK     Status: None   Collection Time    05/25/12 10:45 PM      Result Value Range  Total CK 166  7 - 232 U/L  GLUCOSE, CAPILLARY     Status: None   Collection Time    05/25/12 11:28 PM      Result Value Range   Glucose-Capillary 79  70 - 99 mg/dL  GLUCOSE, CAPILLARY     Status: None   Collection Time    05/26/12  3:18 AM      Result Value Range   Glucose-Capillary 88  70 - 99 mg/dL  CBC     Status: Abnormal   Collection Time     05/26/12  3:41 AM      Result Value Range   WBC 5.7  4.0 - 10.5 K/uL   RBC 3.74 (*) 4.22 - 5.81 MIL/uL   Hemoglobin 11.9 (*) 13.0 - 17.0 g/dL   HCT 41.3 (*) 24.4 - 01.0 %   MCV 92.8  78.0 - 100.0 fL   MCH 31.8  26.0 - 34.0 pg   MCHC 34.3  30.0 - 36.0 g/dL   RDW 27.2  53.6 - 64.4 %   Platelets 207  150 - 400 K/uL  BASIC METABOLIC PANEL     Status: Abnormal   Collection Time    05/26/12  3:41 AM      Result Value Range   Sodium 137  135 - 145 mEq/L   Potassium 3.6  3.5 - 5.1 mEq/L   Chloride 107  96 - 112 mEq/L   CO2 23  19 - 32 mEq/L   Glucose, Bld 84  70 - 99 mg/dL   BUN 13  6 - 23 mg/dL   Creatinine, Ser 0.34  0.50 - 1.35 mg/dL   Calcium 7.7 (*) 8.4 - 10.5 mg/dL   GFR calc non Af Amer >90  >90 mL/min   GFR calc Af Amer >90  >90 mL/min   Comment:            The eGFR has been calculated     using the CKD EPI equation.     This calculation has not been     validated in all clinical     situations.     eGFR's persistently     <90 mL/min signify     possible Chronic Kidney Disease.  MAGNESIUM     Status: None   Collection Time    05/26/12  3:41 AM      Result Value Range   Magnesium 1.8  1.5 - 2.5 mg/dL  PHOSPHORUS     Status: None   Collection Time    05/26/12  3:41 AM      Result Value Range   Phosphorus 2.8  2.3 - 4.6 mg/dL  TROPONIN I     Status: None   Collection Time    05/26/12  3:41 AM      Result Value Range   Troponin I <0.30  <0.30 ng/mL   Comment:            Due to the release kinetics of cTnI,     a negative result within the first hours     of the onset of symptoms does not rule out     myocardial infarction with certainty.     If myocardial infarction is still suspected,     repeat the test at appropriate intervals.  BASIC METABOLIC PANEL     Status: Abnormal   Collection Time    05/27/12  3:38 AM      Result Value Range   Sodium 140  135 - 145 mEq/L   Potassium 3.8  3.5 - 5.1 mEq/L   Chloride 106  96 - 112 mEq/L   CO2 27  19 - 32 mEq/L    Glucose, Bld 95  70 - 99 mg/dL   BUN 9  6 - 23 mg/dL   Creatinine, Ser 4.09  0.50 - 1.35 mg/dL   Calcium 8.0 (*) 8.4 - 10.5 mg/dL   GFR calc non Af Amer >90  >90 mL/min   GFR calc Af Amer >90  >90 mL/min   Comment:            The eGFR has been calculated     using the CKD EPI equation.     This calculation has not been     validated in all clinical     situations.     eGFR's persistently     <90 mL/min signify     possible Chronic Kidney Disease.  CBC     Status: Abnormal   Collection Time    05/27/12  3:38 AM      Result Value Range   WBC 5.4  4.0 - 10.5 K/uL   RBC 3.95 (*) 4.22 - 5.81 MIL/uL   Hemoglobin 12.6 (*) 13.0 - 17.0 g/dL   HCT 81.1 (*) 91.4 - 78.2 %   MCV 91.4  78.0 - 100.0 fL   MCH 31.9  26.0 - 34.0 pg   MCHC 34.9  30.0 - 36.0 g/dL   RDW 95.6  21.3 - 08.6 %   Platelets 232  150 - 400 K/uL    Ct Head Wo Contrast  05/26/2012  *RADIOLOGY REPORT*  Clinical Data:  Altered mental status.  CT HEAD WITHOUT CONTRAST CT CERVICAL SPINE WITHOUT CONTRAST  Technique:  Multidetector CT imaging of the head and cervical spine was performed following the standard protocol without intravenous contrast.  Multiplanar CT image reconstructions of the cervical spine were also generated.  Comparison:  No priors.  CT HEAD  Findings: No acute intracranial abnormalities.  Specifically, no evidence of acute intracranial hemorrhage, no definite findings of acute/subacute cerebral ischemia, no mass, mass effect, hydrocephalus or abnormal intra or extra-axial fluid collections. Visualized paranasal sinuses and mastoids are well pneumatized.  No acute displaced skull fractures are identified.  IMPRESSION: 1.  No acute intracranial abnormalities. 2.  The appearance of the brain is normal.  CT CERVICAL SPINE  Findings: The patient is intubated.  No acute displaced cervical spine fractures.  Mild reversal normal cervical lordosis centered at the level of C4, favored to be positional.  Because of the presence  of endotracheal and nasogastric tube, accurate assessment for prevertebral soft tissue swelling is not possible on this examination.  Notably, the nasogastric tube appears to be partially coiled in the pharynx.  IMPRESSION: 1.  No evidence of significant acute traumatic injury to the cervical spine. 2.  Support apparatus, as above.  The nasogastric tube appears partially coiled within the pharynx.   Original Report Authenticated By: Trudie Reed, M.D.    Ct Cervical Spine Wo Contrast  05/26/2012  *RADIOLOGY REPORT*  Clinical Data:  Altered mental status.  CT HEAD WITHOUT CONTRAST CT CERVICAL SPINE WITHOUT CONTRAST  Technique:  Multidetector CT imaging of the head and cervical spine was performed following the standard protocol without intravenous contrast.  Multiplanar CT image reconstructions of the cervical spine were also generated.  Comparison:  No priors.  CT HEAD  Findings: No acute intracranial abnormalities.  Specifically, no evidence of acute intracranial  hemorrhage, no definite findings of acute/subacute cerebral ischemia, no mass, mass effect, hydrocephalus or abnormal intra or extra-axial fluid collections. Visualized paranasal sinuses and mastoids are well pneumatized.  No acute displaced skull fractures are identified.  IMPRESSION: 1.  No acute intracranial abnormalities. 2.  The appearance of the brain is normal.  CT CERVICAL SPINE  Findings: The patient is intubated.  No acute displaced cervical spine fractures.  Mild reversal normal cervical lordosis centered at the level of C4, favored to be positional.  Because of the presence of endotracheal and nasogastric tube, accurate assessment for prevertebral soft tissue swelling is not possible on this examination.  Notably, the nasogastric tube appears to be partially coiled in the pharynx.  IMPRESSION: 1.  No evidence of significant acute traumatic injury to the cervical spine. 2.  Support apparatus, as above.  The nasogastric tube appears  partially coiled within the pharynx.   Original Report Authenticated By: Trudie Reed, M.D.    Dg Chest Port 1 View  05/26/2012  *RADIOLOGY REPORT*  Clinical Data: Intubated.  Evaluate endotracheal tube.  PORTABLE CHEST - 1 VIEW  Comparison: Chest x-ray 05/25/2012.  Findings: An endotracheal tube is in place with tip 5.3 cm above the carina.  Nasogastric tube is seen extending in the proximal stomach, with the side port near the gastroesophageal junction. Lung volumes are low.  There are bibasilar opacities which are linear in appearance most compatible with subsegmental atelectasis. No definite pleural effusions.  Mild crowding of the pulmonary vasculature, without frank pulmonary edema.  Heart size is normal. The patient is rotated to the left on today's exam, resulting in distortion of the mediastinal contours and reduced diagnostic sensitivity and specificity for mediastinal pathology.  IMPRESSION: 1.  Support apparatus, as above. 2.  Low volumes with bibasilar subsegmental atelectasis.   Original Report Authenticated By: Trudie Reed, M.D.     Review of Systems  Gastrointestinal: Positive for constipation.   Blood pressure 136/80, pulse 67, temperature 98 F (36.7 C), temperature source Oral, resp. rate 18, height 6\' 1"  (1.854 m), weight 109.4 kg (241 lb 2.9 oz), SpO2 94.00%. Physical Exam   Mental Status Examination/Evaluation:   Appearance: on bed confused   Eye Contact::  poor   Speech: pressured   Volume: low   Mood: no answer   Affect: ristricted   Thought Process: organized   Orientation: 4/4    Thought Content: denies AVH   Suicidal Thoughts: No   Homicidal Thoughts: no   Memory: Recent; Poor   Judgement: Impaired   Insight: Lacking   Psychomotor Activity: slow   Concentration: poor   Recall: poor   Akathisia: No   Assessment:   AXIS I: Delirium  NOS, hx of bipolar d/o, hx of cocaine abuse AXIS II: Deferred   AXIS III:  see emdical hx ?     AXIS IV: conflict with girlfriend   AXIS V: 30   ?   Treatment Plan/Recommendations:    - will recommend to continie IVC due to his AMS, and safety. He likely need Psy inpatient after medical clearance. He is very impulsive and has poor insight at this time into his life situation. He is also unrelaible  - will continue to evaluate and start meds if needed later including Murlean Caller 05/27/2012, 4:48 PM

## 2012-05-27 NOTE — Progress Notes (Signed)
Pt girlfriend at bedside, she did go down to eat lunch, and while she was out of the room,  Pt became restless and frequently asking where she is.

## 2012-05-27 NOTE — Progress Notes (Signed)
eLink Physician-Brief Progress Note Patient Name: Brandon [name]. DOB: June 10, 1965 MRN: [id number]  Date of Service  05/27/2012   HPI/Events of Note  Insomnia   eICU Interventions  One time order for ambien 10 mg po    Intervention Category Minor Interventions: Routine modifications to care plan (e.g. PRN medications for pain, fever)  Brandon [name] 05/27/2012, 12:08 AM

## 2012-05-27 NOTE — Progress Notes (Signed)
Pt girl friend left the room, pt started to yell and throw things.  He began to cry and ask for her to come back into the room.  The pt was yelling so loud that the girlfriend could hear him down the hall, and she came back into the room.   The pt then stopped crying and ordered his meal. Encouraged the pt not to throw things and to talk to.  Voices understanding.

## 2012-05-28 DIAGNOSIS — F3160 Bipolar disorder, current episode mixed, unspecified: Secondary | ICD-10-CM

## 2012-05-28 MED ORDER — ZIPRASIDONE HCL 60 MG PO CAPS
60.0000 mg | ORAL_CAPSULE | Freq: Two times a day (BID) | ORAL | Status: DC
  Administered 2012-05-29 – 2012-05-30 (×3): 60 mg via ORAL
  Filled 2012-05-28 (×5): qty 1

## 2012-05-28 MED ORDER — BENZTROPINE MESYLATE 1 MG PO TABS
1.0000 mg | ORAL_TABLET | Freq: Two times a day (BID) | ORAL | Status: DC
  Administered 2012-05-28 – 2012-05-30 (×4): 1 mg via ORAL
  Filled 2012-05-28 (×5): qty 1

## 2012-05-28 MED ORDER — LORAZEPAM 1 MG PO TABS
1.0000 mg | ORAL_TABLET | Freq: Four times a day (QID) | ORAL | Status: DC | PRN
  Administered 2012-05-28 – 2012-05-30 (×4): 1 mg via ORAL
  Filled 2012-05-28 (×4): qty 1

## 2012-05-28 MED ORDER — ZIPRASIDONE HCL 40 MG PO CAPS
40.0000 mg | ORAL_CAPSULE | Freq: Two times a day (BID) | ORAL | Status: DC
  Administered 2012-05-28: 40 mg via ORAL
  Filled 2012-05-28 (×2): qty 1

## 2012-05-28 NOTE — Progress Notes (Addendum)
Clinical Social Work Progress Note PSYCHIATRY SERVICE LINE 05/28/2012  Patient:  Brandon [name]  Account:  [id number]  Admit Date:  05/25/2012  Clinical Social Worker:  Unk Lightning, LCSW  Date/Time:  05/28/2012 12:00 N  Review of Patient  Overall Medical Condition:   Per NP, patient medically stable to dc to inpatient psychiatric facility   Participation Level:  Minimal  Participation Quality  Guarded   Other Participation Quality:   Patient reports he is upset that he is being "forced" to go to inpatient treatment.   Affect  Angry   Cognitive  Alert   Reaction to Medications/Concerns:   None reported   Modes of Intervention  Support  Solution-Focused   Summary of Progress/Plan at Discharge   CSW spoke with NP who reports that patient is medically stable for dc to inpatient psychiatric hospital.    CSW met with patient and girlfriend at bedside. Patient minimally engaged and upset with CSW. Patient reports he knows that CSW is coming to talk to him about being hospitalized. Patient kept eyes closed throughout assessment and would not engage. CSW explained that patient is under IVC and placement would be searched on his behalf. Patient prefers [facility] because he reports he has been there in the past. CSW explained that when a bed is available he will be dc to whichever facility is open.    CSW was called back to the room after girlfriend left. Patient reports if he dc before girlfriend returns then he wants her to be kept informed. CSW and patient spoke about patient's mood. Patient restless in bed and upset that girlfriend left. CSW and patient spoke about unhealthy relationships and that patient needs to take control of his own emotions and mood. Patient not receptive to discussing coping skills and not engaged.    CSW sent referrals to the following hospitals:    [facility]  [facility]  [facility]  [facility]    CSW will continue to follow to  find placement for patient.

## 2012-05-28 NOTE — Progress Notes (Signed)
Clinical Social Work  Update on inpatient search:  CSW spoke with [facility] who reports barrier to admission due to patient not having insurance. If accepted, patient will have to wait for [facility] approval for inpatient treatment. Facility to keep CSW updated on bed availability.  CSW spoke with [facility] who is still awaiting MD to review referral.   CSW will continue to search for bed for inpatient psychiatric treatment.  [place], [place] [phone]

## 2012-05-28 NOTE — Progress Notes (Signed)
eLink Physician-Brief Progress Note Patient Name: Brandon [name]. DOB: 11-25-1965 MRN: [id number]  Date of Service  05/28/2012   HPI/Events of Note  Agitation per RN   eICU Interventions  Resume home dose ativan Resume geodon 40bid awaiting psych eval today   Intervention Category Minor Interventions: Agitation / anxiety - evaluation and management  Brandon [name] V. 05/28/2012, 4:59 PM
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
## 2012-05-28 NOTE — Progress Notes (Signed)
Clinical Social Work  CSW followed up with referrals to determine if patient was accepted:  [facility]-received referral but unable to accept patients for 24-48 hours [facility]-Declined patient due to acuity [facility]-received referral but reports NP needs to review [facility]-received referral but unsure if any beds will be available [facility]-received referral and reports MD has to review  CSW will continue to follow.  [place], [place] [phone]
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
## 2012-05-29 ENCOUNTER — Encounter (HOSPITAL_COMMUNITY): Payer: Self-pay | Admitting: *Deleted

## 2012-05-29 NOTE — Progress Notes (Signed)
Clinical Social Work  CSW met with patient at bedside to discuss [employer]. Patient on phone with girlfriend and put her on speaker phone. CSW explained patient accepted at [facility] and will transfer today. CSW explained [employer] will transport patient and patient understanding. RN agreeable to plan. CSW called [employer] who will transport patient to [facility]. CSW is signing off but available if needed.  [place], [place] [phone]

## 2012-05-29 NOTE — Progress Notes (Signed)
Clinical Social Work  CSW spoke with [facility] who reports they have had to cancel admission and will be unable to accept patient for at least 24 hours. CSW called [facility] to see if patient could be accepted at another facility. [facility] is expecting dc but instructed CSW to call back this afternoon.  CSW updated RN on cancellation of dc. CSW will continue to follow.  [place], [place] [phone]

## 2012-05-29 NOTE — Progress Notes (Signed)
Clinical Social Work  Update on inpatient bed search:  [facility]- unable to accept new patients at this time but was instructed to call back after 1300 to determine if they could accept patients this afternoon [facility]- no available beds [facility]- waiting on [place] to approve bed (stated it could take a few days to get approval if Shelly Coss is agreeable) [facility]- left a message with admissions to inquire about status  [facility]- [facility] patient on 05/28/12 due to medical acuity  CSW will continue to follow. Please call with questions.  [place], [place] [phone]
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
## 2012-05-29 NOTE — Progress Notes (Addendum)
Clinical Social Work  Patient accepted to [facility] under Dr. Daleen Bo to bed 502-1. Patient can be admitted after 1200. CSW informed RN and RN will call report to [phone]. CSW text paged NP regarding dc plans. CSW will continue to follow and will assist with coordination of transfer at 1200.  [place], [place] [phone]  [facility] requests transfer be moved to 1300. CSW informed RN and will follow up to coordinate dc.
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
## 2012-05-30 ENCOUNTER — Inpatient Hospital Stay (HOSPITAL_COMMUNITY)
Admission: EM | Admit: 2012-05-30 | Discharge: 2012-06-03 | DRG: 885 | Disposition: A | Discharge status: Home / Self Care | Payer: No Typology Code available for payment source | Source: Intra-hospital | Attending: Psychiatry | Admitting: Psychiatry

## 2012-05-30 ENCOUNTER — Encounter (HOSPITAL_COMMUNITY): Payer: Self-pay

## 2012-05-30 DIAGNOSIS — Z9189 Other specified personal risk factors, not elsewhere classified: Secondary | ICD-10-CM

## 2012-05-30 DIAGNOSIS — F3130 Bipolar disorder, current episode depressed, mild or moderate severity, unspecified: Principal | ICD-10-CM | POA: Diagnosis present

## 2012-05-30 DIAGNOSIS — Z79899 Other long term (current) drug therapy: Secondary | ICD-10-CM

## 2012-05-30 DIAGNOSIS — M6282 Rhabdomyolysis: Secondary | ICD-10-CM

## 2012-05-30 DIAGNOSIS — T1491XA Suicide attempt, initial encounter: Secondary | ICD-10-CM

## 2012-05-30 DIAGNOSIS — R110 Nausea: Secondary | ICD-10-CM

## 2012-05-30 DIAGNOSIS — R4182 Altered mental status, unspecified: Secondary | ICD-10-CM

## 2012-05-30 DIAGNOSIS — J9600 Acute respiratory failure, unspecified whether with hypoxia or hypercapnia: Secondary | ICD-10-CM

## 2012-05-30 DIAGNOSIS — F1120 Opioid dependence, uncomplicated: Secondary | ICD-10-CM

## 2012-05-30 DIAGNOSIS — D649 Anemia, unspecified: Secondary | ICD-10-CM

## 2012-05-30 DIAGNOSIS — K3184 Gastroparesis: Secondary | ICD-10-CM

## 2012-05-30 DIAGNOSIS — R030 Elevated blood-pressure reading, without diagnosis of hypertension: Secondary | ICD-10-CM

## 2012-05-30 DIAGNOSIS — F1410 Cocaine abuse, uncomplicated: Secondary | ICD-10-CM

## 2012-05-30 DIAGNOSIS — N179 Acute kidney failure, unspecified: Secondary | ICD-10-CM

## 2012-05-30 MED ORDER — MAGNESIUM HYDROXIDE 400 MG/5ML PO SUSP: 30.0000 mL | Freq: Every day | ORAL | Status: DC | PRN

## 2012-05-30 MED ORDER — ALUM & MAG HYDROXIDE-SIMETH 200-200-20 MG/5ML PO SUSP: 30.0000 mL | ORAL | Status: DC | PRN

## 2012-05-30 MED ORDER — ZIPRASIDONE HCL 60 MG PO CAPS
60.0000 mg | ORAL_CAPSULE | Freq: Two times a day (BID) | ORAL | Status: DC
  Filled 2012-05-30 (×2): qty 1

## 2012-05-30 MED ORDER — HYDROXYZINE HCL 25 MG PO TABS
25.0000 mg | ORAL_TABLET | Freq: Four times a day (QID) | ORAL | Status: DC | PRN
  Administered 2012-05-30 – 2012-05-31 (×2): 25 mg via ORAL

## 2012-05-30 MED ORDER — TRAZODONE HCL 100 MG PO TABS
100.0000 mg | ORAL_TABLET | Freq: Every evening | ORAL | Status: DC | PRN
  Administered 2012-05-30 – 2012-06-02 (×4): 100 mg via ORAL
  Filled 2012-05-30 (×3): qty 1
  Filled 2012-05-30: qty 14
  Filled 2012-05-30: qty 1

## 2012-05-30 MED ORDER — ZIPRASIDONE HCL 60 MG PO CAPS
60.0000 mg | ORAL_CAPSULE | Freq: Two times a day (BID) | ORAL | Status: DC
  Administered 2012-05-30 – 2012-06-03 (×8): 60 mg via ORAL
  Filled 2012-05-30 (×11): qty 1

## 2012-05-30 MED ORDER — ACETAMINOPHEN 325 MG PO TABS
650.0000 mg | ORAL_TABLET | Freq: Four times a day (QID) | ORAL | Status: DC | PRN
  Administered 2012-05-30 – 2012-06-02 (×3): 650 mg via ORAL
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
## 2012-05-30 NOTE — Tx Team (Signed)
Initial Interdisciplinary Treatment Plan  PATIENT STRENGTHS: (choose at least two) Ability for insight General fund of knowledge Physical Health Supportive family/friends  PATIENT STRESSORS: Marital or family conflict Occupational concerns Traumatic event   PROBLEM LIST: Problem List/Patient Goals Date to be addressed Date deferred Reason deferred Estimated date of resolution  Suicidal Ideations      Anxiety                                                 DISCHARGE CRITERIA:  Motivation to continue treatment in a less acute level of care Need for constant or close observation no longer present  PRELIMINARY DISCHARGE PLAN: Outpatient therapy Participate in family therapy  PATIENT/FAMIILY INVOLVEMENT: This treatment plan has been presented to and reviewed with the patient, Brandon [name]., and/or family member, .  The patient and family have been given the opportunity to ask questions and make suggestions.  Brandon [name] 05/30/2012, 3:14 PM

## 2012-05-30 NOTE — Progress Notes (Signed)
Clinical Social Work  CSW spoke with [facility] who reports still unable to accept patients. Referral was made to [facility] for crisis bed. [facility] accepted phone referral and reports MD will need to make final decision. CSW followed up with [facility], [facility] and [facility] who still have no available beds. CSW will continue to follow.  [place], [place] [phone]

## 2012-05-30 NOTE — Progress Notes (Signed)
Patient ID: Brandon Seltzer [name], male   DOB: 06/22/1965, 47 y.o.   MRN: [id number] D: Patient in room on approach. Pt presented with anxious mood and flat affect. Pt is observed in th dayroom interaction appropriately with peers. Pt denies SI/HI/AVH and pain.  Cooperative with assessment. No acute distressed noted at this time.   A: Met with pt 1:1. Medications administered as prescribed. Writer encouraged pt to discuss feelings. Pt encouraged to come to staff with any question or concerns. 15 minutes checks for safety.  R: Patient remains safe. He is complaint with medications. Continue current POC.

## 2012-05-30 NOTE — Progress Notes (Signed)
Clinical Social Work  Patient accepted to [facility] room 301-1 by Dr. Daleen Bo. CSW informed RN who is agreeable to dc. CSW informed patient and coordinated transportation via [employer]. CSW is signing off but available if needed.  [place], [place] [phone]
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
## 2012-05-30 NOTE — Progress Notes (Signed)
Clinical Social Work  Patient was accepted to [facility] on 05/29/12 but then bed was revoked due to [facility] admitting issues. CSW called [facility] in order to determine if patient could be accepted today. CSW was instructed to follow up this afternoon to determine if they could admit patients. CSW spoke with [facility] regarding referral and hospital reported that they had reviewed information but was waiting for dc. [place] [facility] stated they would call CSW this afternoon if any dc occurred. CSW will continue to follow.  [place], [place] [phone]

## 2012-05-31 NOTE — Progress Notes (Signed)
Patient ID: Brandon Seltzer [name], male   DOB: 12-Mar-1965, 47 y.o.   MRN: [id number] D: Patient in room on approach. Patient presented with anxious mood and flat affect. Pt is observed in the dayroom interaction appropriately with peers. Pt attended evening AA group. Pt denies SI/HI/AVH and pain.  Cooperative with assessment. No acute distressed noted at this time.   A: Met with pt 1:1. Medications administered as prescribed. Writer encouraged pt to discuss feelings. Pt encouraged to come to staff with any question or concerns. 15 minutes checks for safety.  R: Patient remains safe. He is complaint with medications. Continue current POC.

## 2012-05-31 NOTE — Tx Team (Signed)
Interdisciplinary Treatment Plan Update (Adult)  Date: 05/31/2012  Time Reviewed: 10:05 AM   Progress in Treatment: Attending groups: Yes Participating in groups: Yes Taking medication as prescribed:  Yes Tolerating medication:  Yes Family/Significant othe contact made: Not as yet Patient understands diagnosis: Yes Discussing patient identified problems/goals with staff: Yes Medical problems stabilized or resolved:  Yes Denies suicidal/homicidal ideation: Yes; yet intentional OD before admit Patient has not harmed self or Others: Yes  New problem(s) identified: None Identified  Discharge Plan or Barriers:  CSW is assessing for appropriate referrals. Patient reports he made a stupid decision and needs no further help, simply wants to return to home and work  Additional comments: N/A  Reason for Continuation of Hospitalization: Anxiety Depression Medication stabilization Suicidal ideation   Estimated length of stay: 3-5 days  For review of initial/current patient goals, please see plan of care.  Attendees: Patient:     Family:     Physician:  Geoffery Lyons 05/31/2012 10:05 AM   Nursing:   Nestor Ramp 05/31/2012 10:05 AM   Clinical Social Worker Ronda Fairly 05/31/2012 10:05 AM   Other:     Other:     Other:     Other:      Scribe for Treatment Team:   Carney Bern, LCSWA  05/31/2012 10:05 AM

## 2012-05-31 NOTE — H&P (Signed)
Psychiatric Admission Assessment Adult  Patient Identification:  Brandon [name].  Date of Evaluation:  05/31/2012  Chief Complaint:  Bipolar MRE mixed  History of Present Illness: This is an admission assessment for this 47 year old Caucasian male. Admitted to [facility] from the [facility] with complaints of suicide attempt by overdose, and recent inpatient hospitalization as a result. Patient reports, "I was taken to the [facility] last Sunday. I don't know how I got there. I swallowed a bunch of pills because I was upset and depressed. I don't want to get into why I was upset or depressed. I was probably trying to hurt myself when I swallowed the pills. I was depressed for just 1 day because of life. I don't know what my life is all about any more. The medicines that I swallowed were all mine. I take them for my bipolar disorder illness. I don't remember how long I have had bipolar disorder. I'm not abusing any illegal drugs any more. I was using cocaine, been sober since January of this year. I'm no longer depressed or suicidal".  Elements:  Location:  [facility] adult unit. Quality:  Suicidal ideations and attempts.. Severity:  Severe. Timing:  "few days ago". Duration:  "It lasted 1 day". Context:  "I got frustrated".  Associated Signs/Synptoms:  Depression Symptoms:  depressed mood, hopelessness, suicidal thoughts with specific plan, suicidal attempt,  (Hypo) Manic Symptoms:  Distractibility, Impulsivity,  Anxiety Symptoms:  Excessive Worry,  Psychotic Symptoms:  Hallucinations: Denies  PTSD Symptoms: Had a traumatic exposure:  None reported  Psychiatric Specialty Exam: Physical Exam  Constitutional: He is oriented to person, place, and time. He appears well-developed.  HENT:  Head: Normocephalic.  Eyes: Pupils are equal, round, and reactive to light.  Neck: Normal range of motion.  Cardiovascular: Normal rate.   Respiratory: Effort normal.  GI: Soft.   Musculoskeletal: Normal range of motion.  Neurological: He is alert and oriented to person, place, and time.  Skin: Skin is warm and dry.  Psychiatric: His speech is normal. Thought content normal. His mood appears not anxious. His affect is angry and blunt. His affect is not labile and not inappropriate. He is agitated. Cognition and memory are normal. He expresses inappropriate judgment. He exhibits a depressed mood.    Review of Systems  Constitutional: Negative.   HENT: Negative.   Eyes: Negative.   Respiratory: Negative.   Cardiovascular: Negative.   Gastrointestinal: Negative.   Genitourinary: Negative.   Musculoskeletal: Negative.   Skin: Negative.   Neurological: Negative.   Endo/Heme/Allergies: Negative.   Psychiatric/Behavioral: Positive for depression and substance abuse (Hx Cocaine abuse). Negative for suicidal ideas, hallucinations and memory loss. The patient is not nervous/anxious and does not have insomnia.     Blood pressure 119/71, pulse 80, temperature 97.6 F (36.4 C), temperature source Oral, resp. rate 16, height 6\' 2"  (1.88 m), weight 107.049 kg (236 lb), SpO2 96.00%.Body mass index is 30.29 kg/(m^2).  General Appearance: Casual  Eye Contact::  Poor  Speech:  Clear and Coherent  Volume:  Normal  Mood:  Depressed  Affect:  Blunt and Flat  Thought Process:  Coherent  Orientation:  Full (Time, Place, and Person)  Thought Content:  Denies hallucinations, deluisons and or paranoia  Suicidal Thoughts:  No  Homicidal Thoughts:  No  Memory:  Immediate;   Good Recent;   Good Remote;   Good  Judgement:  Impaired  Insight:  Lacking  Psychomotor Activity:  Normal  Concentration:  Good  Recall:  Good  Akathisia:  No  Handed:  Right  AIMS (if indicated):     Assets:  Desire for Improvement  Sleep:  Number of Hours: 5.75    Past Psychiatric History: Diagnosis: Bipolar affective disorder, depressed  Hospitalizations: [facility] 2  Outpatient Care: Mental health  clinic in [place]  Substance Abuse Care: [place] a long time ago  Self-Mutilation: Denies  Suicidal Attempts: "Yes, by overdose"  Violent Behaviors: Recently attempted suicide by overdose   Past Medical History:   Past Medical History  Diagnosis Date  . Rhabdomyolysis   . Acute renal failure   . Opiate abuse, continuous   . Dental caries     extensive  . Anxiety   . Depression    None.  Allergies:  No Known Allergies  PTA Medications: Prescriptions prior to admission  Medication Sig Dispense Refill  . [DISCONTINUED] ziprasidone (GEODON) 60 MG capsule Take 120 mg by mouth daily before breakfast.       . [DISCONTINUED] ALPRAZolam (XANAX) 1 MG tablet Take 1 mg by mouth at bedtime as needed. For sleep.      . [DISCONTINUED] LORazepam (ATIVAN) 1 MG tablet Take 1 tablet (1 mg total) by mouth every 6 (six) hours as needed for anxiety (agitation).  12 tablet  0  . [DISCONTINUED] Oxycodone HCl 10 MG TABS Take 10 mg by mouth daily as needed. For breakthrough pain.      . [DISCONTINUED] zolpidem (AMBIEN) 10 MG tablet Take 10 mg by mouth at bedtime as needed. For insomnia        Previous Psychotropic Medications:  Medication/Dose  See medication lists               Substance Abuse History in the last 12 months:  yes  Consequences of Substance Abuse: Medical Consequences:  Liver damage, Possible death by overdose Legal Consequences:  Arrests, jail time, Loss of driving privilege. Family Consequences:  Family discord, divorce and or separation.  Social History:  reports that he has never smoked. He does not have any smokeless tobacco history on file. He reports that he uses illicit drugs (Oxycodone, Amphetamines, Barbituates, and Methamphetamines). He reports that he does not drink alcohol. Additional Social History: Pain Medications: overdosed on Klonopin, Xanax & Geodon Prescriptions: see PTA meds list Over the Counter: see PTA meds list History of alcohol / drug use?:  Yes (opiate abuse) Longest period of sobriety (when/how long): continues on those current RX  Current Place of Residence: [place], [place]    Place of Birth: [place]. 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 No pertinent family history.  No results found for this or any previous visit (from the past 72 hour(s)). Psychological Evaluations:  Assessment:   AXIS I:  Bipolar affective disorder, depressed AXIS II:  Deferred AXIS III:   Past Medical History  Diagnosis Date  . Rhabdomyolysis   . Acute renal failure   . Opiate abuse, continuous   . Dental caries     extensive  . Anxiety   . Depression    AXIS IV:  other psychosocial or environmental problems  and Relationship problems AXIS V:  11-20 some danger of hurting self or others possible OR occasionally fails to maintain minimal personal hygiene OR gross impairment in communication  Treatment Plan/Recommendations: 1. Admit for crisis management and stabilization, estimated length of stay 3-5 days.  2. Medication management to reduce current symptoms to base line and improve the patient's overall level of functioning  3. Treat health problems as indicated.  4. Develop treatment plan to decrease risk of relapse upon discharge and the need for readmission.  5. Psycho-social education regarding relapse prevention and self care.  6. Health care follow up as needed for medical problems.  7. Review, reconcile, and reinstate any pertinent home medications for other health issues where appropriate. 8. Call for  consults with hospitalist for any additional specialty patient care services as needed.  Treatment Plan Summary: Daily contact with patient to assess and evaluate symptoms and progress in treatment Medication management  Current Medications:  Current Facility-Administered Medications  Medication Dose Route Frequency Provider Last Rate Last Dose  . acetaminophen (TYLENOL) tablet 650 mg  650 mg Oral Q6H PRN Karolee Stamps, NP   650 mg at 05/30/12 1558  . alum & mag hydroxide-simeth (MAALOX/MYLANTA) 200-200-20 MG/5ML suspension 30 mL  30 mL Oral Q4H PRN Karolee Stamps, NP      . hydrOXYzine (ATARAX/VISTARIL) tablet 25 mg  25 mg Oral Q6H PRN Karolee Stamps, NP   25 mg at 05/30/12 1558  . magnesium hydroxide (MILK OF MAGNESIA) suspension 30 mL  30 mL Oral Daily PRN Karolee Stamps, NP      . traZODone (DESYREL) tablet 100 mg  100 mg Oral QHS PRN Karolee Stamps, NP   100 mg at 05/30/12 2300  . ziprasidone (GEODON) capsule 60 mg  60 mg Oral BID WC Karolee Stamps, NP   60 mg at 05/31/12 0740    Observation Level/Precautions:  15 minute checks  Laboratory:  Reviewed ED lab findings on file  Psychotherapy:  Group sessions  Medications: See medication lists   Consultations: As needed  Discharge Concerns:  Safety   Estimated LOS: 5-7 days  Other:     I certify that inpatient services furnished can reasonably be expected to improve the patient's condition.   Sanjuana Kava 4/4/20149:09 AM  Seen and agreed. Thedore Mins, MD

## 2012-05-31 NOTE — Progress Notes (Signed)
D: Patient denies SI/HI and auditory and visual hallucinations. The patient has an anxious mood and affect. The patient rates his depression and hopelessness both an 8 out of 10 (1 low/10 high). The patient reports sleeping poorly and states that his energy level is low. The patient is attending groups and states that he "wants to get better."  A: Patient given emotional support from RN. Patient encouraged to come to staff with concerns and/or questions. Patient's medication routine continued. Patient's orders and plan of care reviewed.  R: Patient remains cooperative. Will continue to monitor patient q15 minutes for safety.

## 2012-05-31 NOTE — Progress Notes (Signed)
[facility] LCSW Group Therapy  05/31/2012 1:15  Type of Therapy:  Group Therapy 1:15 to 2:30  Participation Level:  Did Not Attend  Clide Dales

## 2012-05-31 NOTE — Progress Notes (Signed)
Adult Psychoeducational Group Note  Date:  05/31/2012 Time:10:00a Group Topic/Focus:  Early Warning Signs:   The focus of this group is to help patients identify signs or symptoms they exhibit before slipping into an unhealthy state or crisis.  Participation Level:  Active  Participation Quality:  Inattentive and Sharing  Affect:  Flat  Cognitive:  Appropriate  Insight: Limited  Engagement in Group:  None  Modes of Intervention:    Additional Comments: Patient kept quiet and said he would rather listen  Casilda Carls 05/31/2012, 11:52 AM

## 2012-05-31 NOTE — [facility] Suicide Risk Assessment (Signed)
[facility] INPATIENT:  Family/Significant Other Suicide Prevention Education  Suicide Prevention Education:  Education Completed; Patient's father, Kaelen Caughlin Sr. at [phone] has been identified by the patient as the family member/significant other with whom the patient will be residing, and identified as the person(s) who will aid the patient in the event of a mental health crisis (suicidal ideations/suicide attempt).  With written consent from the patient, the family member/significant other has been provided the following suicide prevention education, prior to the and/or following the discharge of the patient.  The suicide prevention education provided includes the following:  Suicide risk factors  Suicide prevention and interventions  National Suicide Hotline telephone number  [facility] assessment telephone number  [facility] Emergency Assistance 911  [facility] and/or Residential Mobile Crisis Unit telephone number  Request made of family/significant other to:  Remove weapons (e.g., guns, rifles, knives), all items previously/currently identified as safety concern.  Patient's father does have "one firearm which is locked up and Lee (the patient) does not have access to."  Remove drugs/medications (over-the-counter, prescriptions, illicit drugs), all items previously/currently identified as a safety concern.  The family member/significant other verbalizes understanding of the suicide prevention education information provided.  The family member/significant other agrees to remove the items of safety concern listed above.  Clide Dales 05/31/2012, 4:33 PM

## 2012-05-31 NOTE — [facility] Group Notes (Signed)
[facility] LCSW Aftercare Discharge Planning Group Note   05/31/2012  8:45 AM  Participation Quality:  Minimal   Mood/Affect:  Flat and sleeping  Depression Rating:  5  Anxiety Rating:  5  Thoughts of Suicide:  Negative Will you contract for safety?   NA  Current AVH:  Negative  Plan for Discharge/Comments:  Patient reports desire to return home to parents and return to work. CSW will assess for referrals  Transportation Means:  Family  Supports: Family  Harrill, Julious Payer

## 2012-05-31 NOTE — [facility] Suicide Risk Assessment (Signed)
Suicide Risk Assessment  Admission Assessment     Nursing information obtained from:  Patient Demographic factors:  Male;Divorced or widowed;Caucasian Current Mental Status:  Self-harm behaviors Loss Factors:  Loss of significant relationship;Decrease in vocational status Historical Factors:  Impulsivity;Family history of mental illness or substance abuse Risk Reduction Factors:  Sense of responsibility to family;Employed;Living with another person, especially a relative;Positive social support  CLINICAL FACTORS:   Severe Anxiety and/or Agitation Bipolar Disorder:   Depressive phase Depression:   Aggression Hopelessness Impulsivity Insomnia Previous Psychiatric Diagnoses and Treatments  COGNITIVE FEATURES THAT CONTRIBUTE TO RISK:  Closed-mindedness Polarized thinking    SUICIDE RISK:   Mild:  Suicidal ideation of limited frequency, intensity, duration, and specificity.  There are no identifiable plans, no associated intent, mild dysphoria and related symptoms, good self-control (both objective and subjective assessment), few other risk factors, and identifiable protective factors, including available and accessible social support.  PLAN OF CARE:1. Admit for crisis management and stabilization. 2. Medication management to reduce current symptoms to base line and improve the patient's overall level of functioning 3. Treat health problems as indicated. 4. Develop treatment plan to decrease risk of relapse upon discharge and the need for readmission. 5. Psycho-social education regarding relapse prevention and self care. 6. Health care follow up as needed for medical problems. 7. Restart home medications where appropriate.  I certify that inpatient services furnished can reasonably be expected to improve the patient's condition.  Abdel Effinger,MD 05/31/2012, 10:23 AM

## 2012-05-31 NOTE — [facility] Counselor (Signed)
Adult Comprehensive Assessment  Patient ID: Brandon [name]., male   DOB: 1965-05-25, 47 y.o.   MRN: [id number]  Information Source: Information source: Patient  Current Stressors:  Educational / Learning stressors: Recently failed welding course will have to repeat Employment / Job issues: new job, does not want to loose by being inpatient Family Relationships: Break up with girlfriend, family strained as he destroyed room prior to admit Financial / Lack of resources (include bankruptcy): Minimal income for last year Housing / Lack of housing: Lives with parents Physical health (include injuries & life threatening diseases): NA Social relationships: NA Substance abuse: Opiate abuse and history of alcohol abuse Bereavement / Loss: NA  Living/Environment/Situation:  Living Arrangements: Parent Living conditions (as described by patient or guardian): Good How long has patient lived in current situation?: Feb 2013, since release from jail What is atmosphere in current home: Comfortable;Supportive  Family History:  Marital status: Long term relationship Divorced, when?: March 2013 Long term relationship, how long?: 2 years What types of issues is patient dealing with in the relationship?: on and off again relationship; ex is with another man Additional relationship information: NA Does patient have children?: Yes How many children?: 2 How is patient's relationship with their children?: Two adult sons live in [place]  Childhood History:  By whom was/is the patient raised?: Both parents Additional childhood history information: NA Description of patient's relationship with caregiver when they were a child: Everything was fine Patient's description of current relationship with people who raised him/her: Everything is fine Does patient have siblings?: No Did patient suffer any verbal/emotional/physical/sexual abuse as a child?: No Did patient suffer from severe childhood neglect?: No Has  patient ever been sexually abused/assaulted/raped as an adolescent or adult?: No Was the patient ever a victim of a crime or a disaster?: No Witnessed domestic violence?: No Has patient been effected by domestic violence as an adult?: No  Education:  Highest grade of school patient has completed: 13 Currently a student?: Yes Name of school: Veterinary surgeon person: Unknown How long has the patient attended?: 2 semesters Learning disability?: No ("I have genius IQ")  Employment/Work Situation:   Employment situation: Employed Where is patient currently employed?: [employer] How long has patient been employed?: 1 month Patient's job has been impacted by current illness: No What is the longest time patient has a held a job?: 3 years Where was the patient employed at that time?: [employer] Has patient ever been in the [employer]?: Yes (Describe in comment) Field seismologist for 1 year) Has patient ever served in Buyer, retail?: No  Financial Resources:   Surveyor, quantity resources: Income from employment Does patient have a representative payee or guardian?: No  Alcohol/Substance Abuse:   What has been your use of drugs/alcohol within the last 12 months?: Oxycodone daily 60 to 120 mg If attempted suicide, did drugs/alcohol play a role in this?: Yes (Patient was using ) Alcohol/Substance Abuse Treatment Hx: Past detox If yes, describe treatment: [facility] January 2014 (no record in [place]) Has alcohol/substance abuse ever caused legal problems?: Yes (Recently served 6 months for distribution)  Social Support System:   Patient's Community Support System: Good Describe Community Support System: Parent's ex girlfriend and kids Type of faith/religion: Catholic How does patient's faith help to cope with current illness?: Attendance at [employer]:   Leisure and Hobbies: Nothing anymore  Strengths/Needs:   What things does the patient do well?: Good at school and work In what areas does patient  struggle / problems for patient: Focus, paying attention  Discharge Plan:   Does patient have access to transportation?: Yes Will patient be returning to same living situation after discharge?: Yes Currently receiving community mental health services: Yes (From Whom) ([facility] in [place]) Does patient have financial barriers related to discharge medications?: No  Summary/Recommendations:   Summary and Recommendations (to be completed by the evaluator): Patient is 47 YO divorced employed caucasian male admitted with diagnosis of Bipolar 1 Disorder following intentional overdose.  Patient was tazed and bean bagged in order to control and transport to ED. Patient reports this was no big deal and he is ready to go home. Patient would benefit from crisis stabilization, medication evaluation, therapy groups for processing thoughts/feelings/experiences, psycho ed groups for coping skills, and case management for discharge planning   Clide Dales. 05/31/2012

## 2012-06-01 DIAGNOSIS — F1120 Opioid dependence, uncomplicated: Secondary | ICD-10-CM

## 2012-06-01 DIAGNOSIS — F1410 Cocaine abuse, uncomplicated: Secondary | ICD-10-CM

## 2012-06-01 MED ORDER — HYDROXYZINE PAMOATE 50 MG PO CAPS
50.0000 mg | ORAL_CAPSULE | Freq: Four times a day (QID) | ORAL | Status: DC | PRN
  Administered 2012-06-01 – 2012-06-03 (×6): 50 mg via ORAL
  Filled 2012-06-01: qty 30

## 2012-06-01 NOTE — Progress Notes (Signed)
D) Pt rates his depression and his hopelessness both at a 10. Denies SI and HI. Did approach this Clinical research associate and asked for something for "My nerves". States that he feels as though he is coming out of his skin. Had an order for vistaril 25 mg but it was increased to 50 and that was given to Pt at 1315. Pt then went to his room to sit and 'chill'.  A) Given support, reassurance and praise. Encouraged to come to staff should the medications not help him.  R) Pt able to go to his room and rest. States that the medication helped him to calm down.

## 2012-06-01 NOTE — Progress Notes (Signed)
[facility] MD Progress Note  06/01/2012 3:07 PM Brandon [name].  MRN:  [id number]  Subjective: "I'm feeling run down today. No energy, Can't concentrate".  O: Patient is not attending groups. He been lying in bed sleeping morning, afternoon and evenings. He does go to the cafeteria to eat. Comes out of his room only to grab snacks and drinks. Then will go to his room and snack while lying in bed"  Diagnosis:   Axis I: OPIOID DEPENDENCE, Opioid withdrawal, Cocaine abuse Axis II: Deferred Axis III:  Past Medical History  Diagnosis Date  . Rhabdomyolysis   . Acute renal failure   . Opiate abuse, continuous   . Dental caries     extensive  . Anxiety   . Depression    Axis IV: other psychosocial or environmental problems and Substance absue Axis V: 41-50 serious symptoms  ADL's:  Intact  Sleep: "I'm not sleeping well"  Appetite:  Good  Suicidal Ideation:  Plan:  Denies Intent:  Denies Means:  Denies Homicidal Ideation:  Plan:  Denies Intent:  Denies Means:  Denies  AEB (as evidenced by):  Psychiatric Specialty Exam: Review of Systems  Constitutional: Negative.   HENT: Negative.   Eyes: Negative.   Respiratory: Negative.   Cardiovascular: Negative.   Gastrointestinal: Negative.   Genitourinary: Negative.   Musculoskeletal: Negative.   Skin: Negative.   Neurological: Negative.   Endo/Heme/Allergies: Negative.   Psychiatric/Behavioral: Positive for depression and substance abuse. Negative for suicidal ideas, hallucinations and memory loss. The patient is nervous/anxious and has insomnia.     Blood pressure 87/49, pulse 92, temperature 97.5 F (36.4 C), temperature source Oral, resp. rate 20, height 6\' 2"  (1.88 m), weight 107.049 kg (236 lb), SpO2 96.00%.Body mass index is 30.29 kg/(m^2).  General Appearance: Casual  Eye Contact::  Fair  Speech:  Clear and Coherent  Volume:  Normal  Mood:  Depressed  Affect:  Flat  Thought Process:  Intact  Orientation:  Full  (Time, Place, and Person)  Thought Content:  Rumination  Suicidal Thoughts:  No  Homicidal Thoughts:  No  Memory:  Immediate;   Good Recent;   Good Remote;   Good  Judgement:  Fair  Insight:  Fair  Psychomotor Activity:  Normal  Concentration:  Good  Recall:  Good  Akathisia:  No  Handed:  Right  AIMS (if indicated):     Assets:  Desire for Improvement  Sleep:  Number of Hours: 4.75   Current Medications: Current Facility-Administered Medications  Medication Dose Route Frequency Provider Last Rate Last Dose  . acetaminophen (TYLENOL) tablet 650 mg  650 mg Oral Q6H PRN Karolee Stamps, NP   650 mg at 06/01/12 0855  . alum & mag hydroxide-simeth (MAALOX/MYLANTA) 200-200-20 MG/5ML suspension 30 mL  30 mL Oral Q4H PRN Karolee Stamps, NP      . hydrOXYzine (ATARAX/VISTARIL) tablet 50 mg  50 mg Oral Q6H PRN Sanjuana Kava, NP   50 mg at 06/01/12 1339  . magnesium hydroxide (MILK OF MAGNESIA) suspension 30 mL  30 mL Oral Daily PRN Karolee Stamps, NP      . traZODone (DESYREL) tablet 100 mg  100 mg Oral QHS PRN Karolee Stamps, NP   100 mg at 05/31/12 2252  . ziprasidone (GEODON) capsule 60 mg  60 mg Oral BID WC Karolee Stamps, NP   60 mg at 06/01/12 4098    Lab Results: No results found for this or any previous  visit (from the past 48 hour(s)).  Physical Findings: AIMS: Facial and Oral Movements Muscles of Facial Expression: None, normal Lips and Perioral Area: None, normal Jaw: None, normal Tongue: None, normal,Extremity Movements Upper (arms, wrists, hands, fingers): None, normal Lower (legs, knees, ankles, toes): None, normal, Trunk Movements Neck, shoulders, hips: None, normal, Overall Severity Severity of abnormal movements (highest score from questions above): None, normal Incapacitation due to abnormal movements: None, normal Patient's awareness of abnormal movements (rate only patient's report): No Awareness, Dental Status Current problems with teeth and/or dentures?:  No Does patient usually wear dentures?: No  CIWA:  CIWA-Ar Total: 0 COWS:     Treatment Plan Summary: Daily contact with patient to assess and evaluate symptoms and progress in treatment Medication management  Plan: Supportive approach/coping skills/relapse prevention. Encouraged out of room, participation in group sessions and application of coping skills when distressed. Will continue to monitor response to/adverse effects of medications in use to assure effectiveness. Continue to monitor mood, behavior and interaction with staff and other patients. Continue current plan of care.  Medical Decision Making Problem Points:  Established problem, stable/improving (1), Review of last therapy session (1) and Review of psycho-social stressors (1) Data Points:  Review and summation of old records (2) Review of medication regiment & side effects (2) Review of new medications or change in dosage (2)  I certify that inpatient services furnished can reasonably be expected to improve the patient's condition.   Armandina Stammer I 06/01/2012, 3:07 PM

## 2012-06-01 NOTE — BHH Group Notes (Signed)
[facility] LCSW Group Therapy  06/01/2012 10AM  Did not attend  Sarina Ser 06/01/2012, 12:31 PM

## 2012-06-01 NOTE — Progress Notes (Signed)
Goals  Group [id number]     06/01/2012      0930 Group Topic/Focus:  Identifying  Goal: The focus of this group is to help the patients  a  tParticipation Level:  active Participation Quality: good Affect: flat Cognitive:    Insight:  good  Engagement in Group: engaged  Additional Comments: Pt was engaged in group, asked appropriate questions, shared personal life experience with the group and demonstrates willingness to process and understand her problems. PDuke RN [facility]

## 2012-06-02 NOTE — Progress Notes (Signed)
Patient ID: Brandon [name] Brandon Li., male   DOB: 12/05/1965, 47 y.o.   MRN: [id number] D)  Has been out and about this evening, talkative, interacting with peers, loud and intrusive at times and occasionally needing redirection.  States has ADHD and is frequently anxious, requested vistaril shortly after beginning of shift.  Attended group, has been playing cards with peers afterward, didn't want to go to room at 11pm, wanted to continue playing cards, but agreed when reminded of rules. A)  Will continue to monitor for safety, continue POC. R)  Safety maintained.

## 2012-06-02 NOTE — Progress Notes (Signed)
Thankfulness  Group Note  Date: 06/02/2012 Time:  0900  Group Topic/Focus:  Thankfulness Group :   The focus of this group is to help patients identify  Things / people / events in their lives they are thankful for..  Participation Level:  Did Not Attend  PAdditional Comments:    06/02/2012,9:43 AM Rich Brave

## 2012-06-02 NOTE — BHH Group Notes (Signed)
[facility] LCSW Group Therapy  Did not attend   Sarina Ser 06/02/2012, 11:15 AM
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
## 2012-06-02 NOTE — Progress Notes (Signed)
Psychoeducational Group Note  Date:  06/02/2012 Time: 1015 Group Topic/Focus:  Making Healthy Choices:   The focus of this group is to help patients identify negative/unhealthy choices they were using prior to admission and identify positive/healthier coping strategies to replace them upon discharge.  Participation Level:  Did Not Attend  Participation Quality:  Inattentive  Affect:  Did not attend  Cognitive:  Did not attend  Insight:  Did not attend  Engagement in Group:  Did not attend  Additional Comments:    Rich Brave 4:01 PM. 06/02/2012
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
## 2012-06-02 NOTE — Progress Notes (Signed)
[facility] Group Notes:  (Nursing/MHT/Case Management/Adjunct)  Date: 06/01/2012  Time:  2100 Type of Therapy:  wrap up group  Participation Level:  Active  Participation Quality:  Appropriate, Attentive, Sharing and loud  Affect:  Excited  Cognitive:  Alert  Insight:  Appropriate  Engagement in Group:  Distracting and Engaged  Modes of Intervention:  Clarification, Education and Support  Summary of Progress/Problems:  Brandon [name] 06/02/2012, 1:54 AM

## 2012-06-03 DIAGNOSIS — F3130 Bipolar disorder, current episode depressed, mild or moderate severity, unspecified: Principal | ICD-10-CM

## 2012-06-03 MED ORDER — TRAZODONE HCL 100 MG PO TABS: 100.0000 mg | ORAL_TABLET | Freq: Every evening | ORAL | Status: DC | PRN

## 2012-06-03 MED ORDER — ZIPRASIDONE HCL 60 MG PO CAPS: 60.0000 mg | ORAL_CAPSULE | Freq: Two times a day (BID) | ORAL | Status: DC

## 2012-06-03 MED ORDER — HYDROXYZINE HCL 50 MG PO TABS: 50.0000 mg | ORAL_TABLET | Freq: Four times a day (QID) | ORAL | Status: DC | PRN

## 2012-06-03 NOTE — Tx Team (Signed)
Interdisciplinary Treatment Plan Update (Adult)  Date: 06/03/2012  Time Reviewed: 10:02 AM   Progress in Treatment: Attending groups: Yes Participating in groups: Yes Taking medication as prescribed:  Yes Tolerating medication:  Yes Family/Significant othe contact made: Yes, CSW with father Patient understands diagnosis: Yes Discussing patient identified problems/goals with staff: Yes Medical problems stabilized or resolved:  Yes Denies suicidal/homicidal ideation: Yes Patient has not harmed self or Others: Yes  New problem(s) identified: None Identified  Discharge Plan or Barriers:  CSW will set patient up for medication management and therapy   Additional comments: N/A  Reason for Continuation of Hospitalization: NA  Estimated length of stay: Discharge  For review of initial/current patient goals, please see plan of care.  Attendees: Patient:  Brandon [name] 06/03/2012 10:02  Family:     Physician:  Geoffery Lyons 06/03/2012 10:02 AM   Nursing:   Roswell Miners, RN 06/03/2012 10:02 AM   Clinical Social Worker Ronda Fairly 06/03/2012 10:02 AM   Other:  Berneice Heinrich, RN 06/03/2012 10:02 AM   Other:  Georgina Quint, [place] PA Student 06/03/2012 10:02 AM   Other:     Other:      Scribe for Treatment Team:   Carney Bern, LCSWA  06/03/2012 10:02 AM

## 2012-06-03 NOTE — Progress Notes (Signed)
[facility] Adult Case Management Discharge Plan :  Will you be returning to the same living situation after discharge: Yes, parent's home  At discharge, do you have transportation home? Yes, father Do you have the ability to pay for your medications: Yes through [facility]  Release of information consent forms completed and in the chart;  Patient's signature needed at discharge.  Patient to Follow up at: Follow-up Information   Follow up with [facility] Location). (Follow up appointment for medications and therapy/needs assessment )    Contact information:   [address] [place], [place] [address] [phone]      Patient denies SI/HI: Denies both  Safety Planning and Suicide Prevention discussed: Yes with father  Clide Dales 06/03/2012, 10:12 PM
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
## 2012-06-03 NOTE — Progress Notes (Signed)
Patient denies SI/HI, denies A/V hallucinations. Patient verbalizes understanding of discharge instructions, follow up care and prescriptions. Patient given all belongings from [facility] locker. Patient escorted out by staff, transported by family. 

## 2012-06-03 NOTE — Progress Notes (Signed)
Adult Psychoeducational Group Note  Date:  06/03/2012 Time:  10:53 AM  Group Topic/Focus:  Coping With Mental Health Crisis:   The purpose of this group is to help patients identify strategies for coping with mental health crisis.  Group discusses possible causes of crisis and ways to manage them effectively.  Participation Level:  Minimal  Participation Quality:  Inattentive  Affect:  Excited  Cognitive:  Alert  Insight: Good  Engagement in Group:  Distracting  Modes of Intervention:  Activity and Discussion  Additional Comments:  Pt  Is here to work on his substance abuse.  [name] T 06/03/2012, 10:53 AM
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
## 2012-06-03 NOTE — Progress Notes (Signed)
Patient did attend the evening speaker AA meeting.  

## 2012-06-03 NOTE — [facility] Suicide Risk Assessment (Signed)
Suicide Risk Assessment  Discharge Assessment     Demographic Factors:  Male and Caucasian  Mental Status Per Nursing Assessment::   On Admission:  Self-harm behaviors  Current Mental Status by Physician: In full contact with reality. There are no suicidal ideas, plans or intent. Mood is anxious, affect is appropriate. States he is ready to be discharged. He has a safe home to go back to. States he has accepted that the relationship with his girlfriend might not work out and he is ready to let it go. He is committed to abstinence. Can see how the instability in the relationsip and using alcohol, and drugs takes control away from him.   Loss Factors: Loss of significant relationship  Historical Factors: NA  Risk Reduction Factors:   Sense of responsibility to family, Employed, Living with another person, especially a relative and Positive social support  Continued Clinical Symptoms:  Alcohol/Substance Abuse/Dependencies  Cognitive Features That Contribute To Risk:  Closed-mindedness Thought constriction (tunnel vision)    Suicide Risk:  Mild:  Suicidal ideation of limited frequency, intensity, duration, and specificity.  There are no identifiable plans, no associated intent, mild dysphoria and related symptoms, good self-control (both objective and subjective assessment), few other risk factors, and identifiable protective factors, including available and accessible social support.  Discharge Diagnoses:   AXIS I:  Opioid Dependence, Cocaine Abuse, ADHD, Mood Disorder NOS AXIS II:  Deferred AXIS III:   Past Medical History  Diagnosis Date  . Rhabdomyolysis   . Acute renal failure   . Opiate abuse, continuous   . Dental caries     extensive  . Anxiety   . Depression    AXIS IV:  other psychosocial or environmental problems and problems with primary support group AXIS V:  61-70 mild symptoms  Plan Of Care/Follow-up recommendations:  Activity:  as tolerated Diet:   regular Will follow up outpatient basis Is patient on multiple antipsychotic therapies at discharge:  No   Has Patient had three or more failed trials of antipsychotic monotherapy by history:  No  Recommended Plan for Multiple Antipsychotic Therapies: N/A   Michi Herrmann A 06/03/2012, 1:34 PM

## 2012-06-03 NOTE — Progress Notes (Signed)
Psychoeducational Group Note  Date:  06/03/2012 Time:  1100  Group Topic/Focus:  Self Care:   The focus of this group is to help patients understand the importance of self-care in order to improve or restore emotional, physical, spiritual, interpersonal, and financial health.  Participation Level: Did Not Attend  Participation Quality:  Not Applicable  Affect:  Not Applicable  Cognitive:  Not Applicable  Insight:  Not Applicable  Engagement in Group: Not Applicable  Additional Comments:  Pt did not attend group.   Sharyn Lull 06/03/2012, 1:13 PM

## 2012-06-06 NOTE — Progress Notes (Addendum)
Patient Discharge Instructions:  After Visit Summary (AVS):   Faxed to:  06/06/12 Psychiatric Admission Assessment Note:   Faxed to:  06/06/12 Suicide Risk Assessment - Discharge Assessment:   Faxed to:  06/06/12 Faxed/Sent to the Next Level Care provider:  06/06/12 Faxed to [facility] @ [phone]  [name], 06/06/2012, 4:08 PM

## 2012-06-07 NOTE — Discharge Summary (Signed)
Physician Discharge Summary Note  Patient:  Brandon Bun. is an 47 y.o., male MRN:  [id number] DOB:  1965-07-10 Patient phone:  [phone] (home)  Patient address:   [address] [place] [address],   Date of Admission:  05/30/2012 Date of Discharge: 06/03/2012  Reason for Admission:  Depression with suicide attempt by overdose  Discharge Diagnoses: Active Problems:   * No active hospital problems. *  Review of Systems  Constitutional: Negative.   HENT: Negative.   Eyes: Negative.   Respiratory: Negative.   Cardiovascular: Negative.   Gastrointestinal: Negative.   Genitourinary: Negative.   Musculoskeletal: Negative.   Skin: Negative.   Neurological: Negative.   Endo/Heme/Allergies: Negative.   Psychiatric/Behavioral: Positive for depression and substance abuse. The patient is nervous/anxious.    Axis Diagnosis:   AXIS I:  Bipolar, Depressed AXIS II:  Deferred AXIS III:   Past Medical History  Diagnosis Date  . Rhabdomyolysis   . Acute renal failure   . Opiate abuse, continuous   . Dental caries     extensive  . Anxiety   . Depression    AXIS IV:  other psychosocial or environmental problems, problems related to social environment and problems with primary support group AXIS V:  61-70 mild symptoms  Level of Care:  OP  Hospital Course:  On admission:  Admitted to [facility] from the [facility] with complaints of suicide attempt by overdose, and recent inpatient hospitalization as a result. Patient reports, "I was taken to the [facility] last Sunday. I don't know how I got there. I swallowed a bunch of pills because I was upset and depressed. I don't want to get into why I was upset or depressed. I was probably trying to hurt myself when I swallowed the pills. I was depressed for just 1 day because of life. I don't know what my life is all about any more. The medicines that I swallowed were all mine. I take them for my bipolar disorder  illness. I don't remember how long I have had bipolar disorder. I'm not abusing any illegal drugs any more. I was using cocaine, been sober since January of this year. I'm no longer depressed or suicidal".  During hospitalization:  Medication managed--his Xanax 1 mg at bedtime, Ativan 1 mg q6h PRN anxiety, oxycodone PRN pain, and Ambien 10 mg at bedtime discontinued.  His Geodon 120 mg in the am was divided, 60 mg BID for mood stabilization, Trazodone 50 mg for sleep.  He attended some therapy groups and wants to stay clean from drugs and alcohol.  Brandon [name] will focus on work and school, and try to let his old girlfriend "go".  Patient denied suicidal/homicidal ideations and auditory/visual hallucinations, follow-up appointments encouraged to attend, outside support groups encouraged and information given.  Rx given at discharge.  Brandon [name] is mentally and physically stable for discharge.  Consults:  None  Significant Diagnostic Studies:  labs: Completed and reviewed, stable  Discharge Vitals:   Blood pressure 116/68, pulse 80, temperature 97.6 F (36.4 C), temperature source Oral, resp. rate 20, height 6\' 2"  (1.88 m), weight 107.049 kg (236 lb), SpO2 96.00%. Body mass index is 30.29 kg/(m^2). Lab Results:   No results found for this or any previous visit (from the past 72 hour(s)).  Physical Findings: AIMS: Facial and Oral Movements Muscles of Facial Expression: None, normal Lips and Perioral Area: None, normal Jaw: None, normal Tongue: None, normal,Extremity Movements Upper (arms, wrists, hands, fingers):  None, normal Lower (legs, knees, ankles, toes): None, normal, Trunk Movements Neck, shoulders, hips: None, normal, Overall Severity Severity of abnormal movements (highest score from questions above): None, normal Incapacitation due to abnormal movements: None, normal Patient's awareness of abnormal movements (rate only patient's report): No Awareness, Dental Status Current problems with teeth  and/or dentures?: No Does patient usually wear dentures?: No  CIWA:  CIWA-Ar Total: 0 COWS:     Psychiatric Specialty Exam: See Psychiatric Specialty Exam and Suicide Risk Assessment completed by Attending Physician prior to discharge.  Discharge destination:  Home  Is patient on multiple antipsychotic therapies at discharge:  No   Has Patient had three or more failed trials of antipsychotic monotherapy by history:  No Recommended Plan for Multiple Antipsychotic Therapies:  N/A  Discharge Orders   Future Orders Complete By Expires     Activity as tolerated - No restrictions  As directed     Diet - low sodium heart healthy  As directed         Medication List    STOP taking these medications       ALPRAZolam 1 MG tablet  Commonly known as:  XANAX     LORazepam 1 MG tablet  Commonly known as:  ATIVAN     Oxycodone HCl 10 MG Tabs     zolpidem 10 MG tablet  Commonly known as:  AMBIEN      TAKE these medications     Indication   hydrOXYzine 50 MG tablet  Commonly known as:  ATARAX/VISTARIL  Take 1 tablet (50 mg total) by mouth every 6 (six) hours as needed for anxiety.      traZODone 100 MG tablet  Commonly known as:  DESYREL  Take 1 tablet (100 mg total) by mouth at bedtime as needed for sleep.      ziprasidone 60 MG capsule  Commonly known as:  GEODON  Take 1 capsule (60 mg total) by mouth 2 (two) times daily with a meal.   Indication:  Manic-Depression           Follow-up Information   Follow up with [facility]). (Follow up appointment for medications and therapy/needs assessment )    Contact information:   [address] [place], [place] [address] [phone]      Follow-up recommendations:  Activity:  As tolerated Diet:  Low-sodium heart healthy diet  Comments:  Patient will continue his care at [facility].                       Continue to work the relapse prevention plan Total Discharge Time:  Greater than 30 minutes.  SignedNanine Means, PMH-NP 06/07/2012, 3:45 PM

## 2012-06-10 ENCOUNTER — Encounter (HOSPITAL_COMMUNITY): Payer: Self-pay

## 2012-06-18 NOTE — Discharge Summary (Signed)
Patient seen and examined, agree with above note.  I dictated the care and orders written for this patient under my direction.  Wesam G Yacoub, MD [phone] 

## 2012-10-14 ENCOUNTER — Inpatient Hospital Stay (HOSPITAL_COMMUNITY)
Admission: EM | Admit: 2012-10-14 | Discharge: 2012-10-16 | DRG: 918 | Disposition: A | Discharge status: Other Acute Inpatient Hospital | Payer: No Typology Code available for payment source | Attending: Internal Medicine | Admitting: Internal Medicine

## 2012-10-14 ENCOUNTER — Encounter (HOSPITAL_COMMUNITY): Payer: Self-pay | Admitting: Emergency Medicine

## 2012-10-14 DIAGNOSIS — F1120 Opioid dependence, uncomplicated: Secondary | ICD-10-CM | POA: Diagnosis present

## 2012-10-14 DIAGNOSIS — F39 Unspecified mood [affective] disorder: Secondary | ICD-10-CM

## 2012-10-14 DIAGNOSIS — T394X2A Poisoning by antirheumatics, not elsewhere classified, intentional self-harm, initial encounter: Secondary | ICD-10-CM | POA: Diagnosis present

## 2012-10-14 DIAGNOSIS — R339 Retention of urine, unspecified: Secondary | ICD-10-CM | POA: Diagnosis not present

## 2012-10-14 DIAGNOSIS — F3130 Bipolar disorder, current episode depressed, mild or moderate severity, unspecified: Secondary | ICD-10-CM

## 2012-10-14 DIAGNOSIS — T405X1A Poisoning by cocaine, accidental (unintentional), initial encounter: Secondary | ICD-10-CM | POA: Diagnosis present

## 2012-10-14 DIAGNOSIS — F1910 Other psychoactive substance abuse, uncomplicated: Secondary | ICD-10-CM

## 2012-10-14 DIAGNOSIS — T50992A Poisoning by other drugs, medicaments and biological substances, intentional self-harm, initial encounter: Secondary | ICD-10-CM | POA: Diagnosis present

## 2012-10-14 DIAGNOSIS — Y92009 Unspecified place in unspecified non-institutional (private) residence as the place of occurrence of the external cause: Secondary | ICD-10-CM

## 2012-10-14 DIAGNOSIS — T43502A Poisoning by unspecified antipsychotics and neuroleptics, intentional self-harm, initial encounter: Secondary | ICD-10-CM | POA: Diagnosis present

## 2012-10-14 DIAGNOSIS — T40601A Poisoning by unspecified narcotics, accidental (unintentional), initial encounter: Secondary | ICD-10-CM | POA: Diagnosis present

## 2012-10-14 DIAGNOSIS — R9431 Abnormal electrocardiogram [ECG] [EKG]: Secondary | ICD-10-CM | POA: Diagnosis present

## 2012-10-14 DIAGNOSIS — T43501A Poisoning by unspecified antipsychotics and neuroleptics, accidental (unintentional), initial encounter: Principal | ICD-10-CM | POA: Diagnosis present

## 2012-10-14 DIAGNOSIS — F1410 Cocaine abuse, uncomplicated: Secondary | ICD-10-CM | POA: Diagnosis present

## 2012-10-14 DIAGNOSIS — R5381 Other malaise: Secondary | ICD-10-CM | POA: Diagnosis present

## 2012-10-14 DIAGNOSIS — T50902A Poisoning by unspecified drugs, medicaments and biological substances, intentional self-harm, initial encounter: Secondary | ICD-10-CM

## 2012-10-14 DIAGNOSIS — F319 Bipolar disorder, unspecified: Secondary | ICD-10-CM | POA: Diagnosis present

## 2012-10-14 DIAGNOSIS — T50901A Poisoning by unspecified drugs, medicaments and biological substances, accidental (unintentional), initial encounter: Secondary | ICD-10-CM

## 2012-10-14 DIAGNOSIS — R45851 Suicidal ideations: Secondary | ICD-10-CM

## 2012-10-14 DIAGNOSIS — F1920 Other psychoactive substance dependence, uncomplicated: Secondary | ICD-10-CM

## 2012-10-14 HISTORY — DX: Bipolar disorder, unspecified: F31.9

## 2012-10-14 HISTORY — DX: Unspecified mood (affective) disorder: F39

## 2012-10-14 LAB — COMPREHENSIVE METABOLIC PANEL
AST: 65 U/L — ABNORMAL HIGH (ref 0–37)
Albumin: 3.6 g/dL (ref 3.5–5.2)
Alkaline Phosphatase: 53 U/L (ref 39–117)
BUN: 16 mg/dL (ref 6–23)
CO2: 28 mEq/L (ref 19–32)
Chloride: 104 mEq/L (ref 96–112)
Creatinine, Ser: 1.06 mg/dL (ref 0.50–1.35)
GFR calc non Af Amer: 82 mL/min — ABNORMAL LOW (ref >90)
Potassium: 3.9 mEq/L (ref 3.5–5.1)
Total Bilirubin: 0.5 mg/dL (ref 0.3–1.2)

## 2012-10-14 LAB — CBC WITH DIFFERENTIAL/PLATELET
Basophils Absolute: 0 10*3/uL (ref 0.0–0.1)
Lymphocytes Relative: 16 % (ref 12–46)
Lymphs Abs: 0.9 10*3/uL (ref 0.7–4.0)
Neutro Abs: 3.9 10*3/uL (ref 1.7–7.7)
Platelets: 233 10*3/uL (ref 150–400)
RBC: 3.94 MIL/uL — ABNORMAL LOW (ref 4.22–5.81)
RDW: 12.9 % (ref 11.5–15.5)
WBC: 5.8 10*3/uL (ref 4.0–10.5)

## 2012-10-14 LAB — RAPID URINE DRUG SCREEN, HOSP PERFORMED
Barbiturates: NOT DETECTED
Cocaine: POSITIVE — AB
Opiates: NOT DETECTED
Tetrahydrocannabinol: NOT DETECTED

## 2012-10-14 LAB — ACETAMINOPHEN LEVEL: Acetaminophen (Tylenol), Serum: <15 ug/mL (ref 10–30)

## 2012-10-14 LAB — SALICYLATE LEVEL: Salicylate Lvl: <2 mg/dL — ABNORMAL LOW (ref 2.8–20.0)

## 2012-10-14 MED ORDER — TRAZODONE HCL 50 MG PO TABS
50.0000 mg | ORAL_TABLET | Freq: Every evening | ORAL | Status: DC | PRN
Start: 2012-10-14 — End: 2012-10-16
  Administered 2012-10-15 (×2): 50 mg via ORAL
  Filled 2012-10-14 (×2): qty 1

## 2012-10-14 MED ORDER — SODIUM CHLORIDE 0.9 % IV SOLN: 1000.0000 mL | INTRAVENOUS | Status: DC

## 2012-10-14 MED ORDER — NALOXONE HCL 0.4 MG/ML IJ SOLN: 0.4000 mg | INTRAMUSCULAR | Status: DC | PRN

## 2012-10-14 MED ORDER — ENOXAPARIN SODIUM 40 MG/0.4ML ~~LOC~~ SOLN
40.0000 mg | SUBCUTANEOUS | Status: DC
  Administered 2012-10-14 – 2012-10-15 (×2): 40 mg via SUBCUTANEOUS
  Filled 2012-10-14 (×3): qty 0.4

## 2012-10-14 MED ORDER — DEXTROSE-NACL 5-0.45 % IV SOLN
INTRAVENOUS | Status: AC
  Administered 2012-10-14: 15:00:00 via INTRAVENOUS

## 2012-10-14 MED ORDER — SODIUM CHLORIDE 0.9 % IV SOLN
1000.0000 mL | Freq: Once | INTRAVENOUS | Status: AC
  Administered 2012-10-14: 1000 mL via INTRAVENOUS

## 2012-10-14 MED ORDER — ADULT MULTIVITAMIN W/MINERALS CH
1.0000 | ORAL_TABLET | Freq: Every day | ORAL | Status: DC
  Administered 2012-10-15 – 2012-10-16 (×2): 1 via ORAL
  Filled 2012-10-14 (×3): qty 1

## 2012-10-14 MED ORDER — FOLIC ACID 1 MG PO TABS
1.0000 mg | ORAL_TABLET | Freq: Every day | ORAL | Status: DC
  Administered 2012-10-15 – 2012-10-16 (×2): 1 mg via ORAL
  Filled 2012-10-14 (×3): qty 1

## 2012-10-14 NOTE — ED Notes (Signed)
Pt from home. Called father this am and told him he would kill himself. Has hx of same.  Pt hax hx of taking cocaine and oxycodone. Pt told MD he took some other home meds as well. When EMS arrived pt RR rate2/minute. EMS gave 0.5mg  of narcan, RR increased to 10/min.  Pt responsive to stimulation. Pupil pinpoint bilaterally.

## 2012-10-14 NOTE — ED Notes (Signed)
Pt. Belongings are in locker 27.

## 2012-10-14 NOTE — Progress Notes (Signed)
Patient declines MyChart activation 

## 2012-10-14 NOTE — Consult Note (Signed)
Reason for Consult: intentional drug overdose Referring Physician: Dr. [name] is an 47 y.o. male.  HPI: Brandon [name] is a 47 y.o. male with longs history of depression, Bipolar disorder, polysubstance abuse and prior suicide attempts was admitted to the [facility] long hospital medical floor for intentional drug overdose. Reportedly he has been increasingly depressed about lack of a job and recent breakup with girlfriend and called his father this morning stating that he wanted to kill himself. Subsequently EMS arrived and found him obtunded and unresponsive. He is admitted to trying to commit suicide by taking Oxycodone, cocaine and Geodon. Patient to urine drug screen is positive for cocaine benzodiazepines and amphetamines. Patient has at least 2 previous psychiatric admissions in the [facility],  April and January of 2014 for bipolar disorder and also substance abuse versus intoxication   MSE: Patient was under sedation and unable to complete the mental status examination at this time.  Past Medical History  Diagnosis Date  . Drug abuse   . Kidney failure   . Bipolar 1 disorder   . Rhabdomyolysis   . Acute renal failure   . Opiate abuse, continuous   . Dental caries     extensive  . Anxiety   . Depression   . Bipolar affective disorder 03/17/2012  . Mood disorder 03/16/2012    Past Surgical History  Procedure Laterality Date  . Knee surgery      Family History  Problem Relation Age of Onset  . Heart disease Mother   . Heart disease Father     Social History:  reports that he has never smoked. He has never used smokeless tobacco. He reports that he uses illicit drugs (Oxycodone, Amphetamines, Barbituates, Cocaine, and Methamphetamines). He reports that he does not drink alcohol.  Allergies: No Known Allergies  Medications: I have reviewed the patient's current medications.  Results for orders placed during the hospital encounter of 10/14/12 (from the past 48 hour(s))   URINE RAPID DRUG SCREEN (HOSP PERFORMED)     Status: Abnormal   Collection Time    10/14/12 11:40 AM      Result Value Range   Opiates NONE DETECTED  NONE DETECTED   Cocaine POSITIVE (*) NONE DETECTED   Benzodiazepines POSITIVE (*) NONE DETECTED   Amphetamines POSITIVE (*) NONE DETECTED   Tetrahydrocannabinol NONE DETECTED  NONE DETECTED   Barbiturates NONE DETECTED  NONE DETECTED   Comment:            DRUG SCREEN FOR MEDICAL PURPOSES     ONLY.  IF CONFIRMATION IS NEEDED     FOR ANY PURPOSE, NOTIFY LAB     WITHIN 5 DAYS.                LOWEST DETECTABLE LIMITS     FOR URINE DRUG SCREEN     Drug Class       Cutoff (ng/mL)     Amphetamine      1000     Barbiturate      200     Benzodiazepine   200     Tricyclics       300     Opiates          300     Cocaine          300     THC              50  ACETAMINOPHEN LEVEL     Status: None  Collection Time    10/14/12 11:46 AM      Result Value Range   Acetaminophen (Tylenol), Serum <15.0  10 - 30 ug/mL   Comment:            THERAPEUTIC CONCENTRATIONS VARY     SIGNIFICANTLY. A RANGE OF 10-30     ug/mL MAY BE AN EFFECTIVE     CONCENTRATION FOR MANY PATIENTS.     HOWEVER, SOME ARE BEST TREATED     AT CONCENTRATIONS OUTSIDE THIS     RANGE.     ACETAMINOPHEN CONCENTRATIONS     >150 ug/mL AT 4 HOURS AFTER     INGESTION AND >50 ug/mL AT 12     HOURS AFTER INGESTION ARE     OFTEN ASSOCIATED WITH TOXIC     REACTIONS.  COMPREHENSIVE METABOLIC PANEL     Status: Abnormal   Collection Time    10/14/12 11:46 AM      Result Value Range   Sodium 140  135 - 145 mEq/L   Potassium 3.9  3.5 - 5.1 mEq/L   Chloride 104  96 - 112 mEq/L   CO2 28  19 - 32 mEq/L   Glucose, Bld 108 (*) 70 - 99 mg/dL   BUN 16  6 - 23 mg/dL   Creatinine, Ser 1.61  0.50 - 1.35 mg/dL   Calcium 8.6  8.4 - 09.6 mg/dL   Total Protein 6.4  6.0 - 8.3 g/dL   Albumin 3.6  3.5 - 5.2 g/dL   AST 65 (*) 0 - 37 U/L   ALT 26  0 - 53 U/L   Alkaline Phosphatase 53  39 - 117  U/L   Total Bilirubin 0.5  0.3 - 1.2 mg/dL   GFR calc non Af Amer 82 (*) >90 mL/min   GFR calc Af Amer >90  >90 mL/min   Comment: (NOTE)     The eGFR has been calculated using the CKD EPI equation.     This calculation has not been validated in all clinical situations.     eGFR's persistently <90 mL/min signify possible Chronic Kidney     Disease.  ETHANOL     Status: None   Collection Time    10/14/12 11:46 AM      Result Value Range   Alcohol, Ethyl (B) <11  0 - 11 mg/dL   Comment:            LOWEST DETECTABLE LIMIT FOR     SERUM ALCOHOL IS 11 mg/dL     FOR MEDICAL PURPOSES ONLY  SALICYLATE LEVEL     Status: Abnormal   Collection Time    10/14/12 11:46 AM      Result Value Range   Salicylate Lvl <2.0 (*) 2.8 - 20.0 mg/dL  CBC WITH DIFFERENTIAL     Status: Abnormal   Collection Time    10/14/12 11:46 AM      Result Value Range   WBC 5.8  4.0 - 10.5 K/uL   RBC 3.94 (*) 4.22 - 5.81 MIL/uL   Hemoglobin 12.6 (*) 13.0 - 17.0 g/dL   HCT 04.5 (*) 40.9 - 81.1 %   MCV 92.1  78.0 - 100.0 fL   MCH 32.0  26.0 - 34.0 pg   MCHC 34.7  30.0 - 36.0 g/dL   RDW 91.4  78.2 - 95.6 %   Platelets 233  150 - 400 K/uL   Neutrophils Relative % 67  43 - 77 %  Neutro Abs 3.9  1.7 - 7.7 K/uL   Lymphocytes Relative 16  12 - 46 %   Lymphs Abs 0.9  0.7 - 4.0 K/uL   Monocytes Relative 16 (*) 3 - 12 %   Monocytes Absolute 0.9  0.1 - 1.0 K/uL   Eosinophils Relative 1  0 - 5 %   Eosinophils Absolute 0.1  0.0 - 0.7 K/uL   Basophils Relative 0  0 - 1 %   Basophils Absolute 0.0  0.0 - 0.1 K/uL    No results found.  Positive for bad mood, behavior problems, bipolar, excessive alcohol consumption, illegal drug usage, mood swings and sleep disturbance Blood pressure 110/69, pulse 66, temperature 97.6 F (36.4 C), temperature source Oral, resp. rate 18, height 6\' 3"  (1.905 m), weight 101.3 kg (223 lb 5.2 oz), SpO2 99.00%.   Assessment/Plan: Bipolar disorder, most recent episode is a  depression Polysubstance abuse versus dependence Status post intentional overdose of medication drugs and medication  Plan: 1. Discontinue Geodon secondary to overdose and continue Supportive therapy 2. Monitor for drug withdrawal symptoms 3. Trazodone 50 mg at bedtime as needed 4. Appreciate psychiatric consultation and followup   Nehemiah Settle., M.D. 10/14/2012, 6:09 PM

## 2012-10-14 NOTE — ED Provider Notes (Signed)
CSN: [id number]     Arrival date & time 10/14/12  1111 History   First MD Initiated Contact with Patient 10/14/12 1112     Chief Complaint  Patient presents with  . Drug Overdose   History obtained from father and the patient HPI Patient presents to the emergency room after a probable drug overdose. Patient has a history of bipolar disorder, substance abuse, and prior drug overdose. Patient has been depressed about his inability to get a job. His girlfriend also recently broke up with him. The patient's father states that this a.m. he told his father he wanted to kill himself. Police were called EMS responded and the patient was found unresponsive in his home. He was given 0.5 mg of Narcan with some improvement of his mental state an increase in his respiratory rate. The patient is somnolent but he admits this morning that he took Geodon, cocaine and oxycodone. Past Medical History  Diagnosis Date  . Drug abuse   . Kidney failure   . Bipolar 1 disorder   . Rhabdomyolysis   . Acute renal failure   . Opiate abuse, continuous   . Dental caries     extensive  . Anxiety   . Depression    Past Surgical History  Procedure Laterality Date  . Knee surgery     Family History  Problem Relation Age of Onset  . Heart disease Mother   . Heart disease Father    History  Substance Use Topics  . Smoking status: Never Smoker   . Smokeless tobacco: Not on file  . Alcohol Use: No    Review of Systems  All other systems reviewed and are negative.    Allergies  Review of patient's allergies indicates no known allergies.  Home Medications   Current Outpatient Rx  Name  Route  Sig  Dispense  Refill  . folic acid (FOLVITE) 1 MG tablet   Oral   Take 1 tablet (1 mg total) by mouth daily. For Folic acid replacement         . hydrOXYzine (ATARAX/VISTARIL) 50 MG tablet   Oral   Take 1 tablet (50 mg total) by mouth every 6 (six) hours as needed for anxiety.   30 tablet   0   . Multiple  Vitamin (MULTIVITAMIN WITH MINERALS) TABS   Oral   Take 1 tablet by mouth daily. For vitamin replacement   30 tablet      . traZODone (DESYREL) 100 MG tablet   Oral   Take 1 tablet (100 mg total) by mouth at bedtime as needed for sleep.   30 tablet   0   . traZODone (DESYREL) 50 MG tablet   Oral   Take 1 tablet (50 mg total) by mouth at bedtime as needed and may repeat dose one time if needed for sleep. For sleep   60 tablet   0   . ziprasidone (GEODON) 60 MG capsule   Oral   Take 1 capsule (60 mg total) by mouth 2 (two) times daily with a meal. For mood control   60 capsule   0   . ziprasidone (GEODON) 60 MG capsule   Oral   Take 1 capsule (60 mg total) by mouth 2 (two) times daily with a meal.   60 capsule   0    BP 103/65  Pulse 67  Temp(Src) 97.9 F (36.6 C) (Oral)  Resp 15  SpO2 96% Physical Exam  Nursing note and vitals reviewed. Constitutional:  He appears well-developed and well-nourished. He appears lethargic.  HENT:  Head: Normocephalic and atraumatic.  Right Ear: External ear normal.  Left Ear: External ear normal.  Mouth/Throat: No oropharyngeal exudate.  Eyes: Conjunctivae are normal. Right eye exhibits no discharge. Left eye exhibits no discharge. No scleral icterus.  Pupils are 1-2 mm bilaterally  Neck: Neck supple. No tracheal deviation present.  Cardiovascular: Normal rate, regular rhythm and intact distal pulses.   Pulmonary/Chest: Effort normal and breath sounds normal. No stridor. No respiratory distress. He has no wheezes. He has no rales.  White residue noted on chest wall  Abdominal: Soft. Bowel sounds are normal. He exhibits no distension. There is no tenderness. There is no rebound and no guarding.  Musculoskeletal: He exhibits no edema and no tenderness.  Neurological: He has normal strength. He appears lethargic. No sensory deficit. Cranial nerve deficit:  no gross defecits noted. He exhibits normal muscle tone. He displays no seizure  activity. Coordination abnormal. GCS eye subscore is 4. GCS verbal subscore is 5. GCS motor subscore is 6.  Patient moves all his extremities, he responds to voice, he is somnolent but arousable  Skin: Skin is warm and dry. No rash noted.  Psychiatric: His speech is slurred. He is slowed. He exhibits a depressed mood. He expresses suicidal ideation.    ED Course  EKG Normal sinus rhythm rate 71 Normal axis, borderline prolonged QT interval Normal ST-T waves EKG is similar appearance to EKG dated 05/25/2012  Medications  0.9 %  sodium chloride infusion (1,000 mL Intravenous New Bag/Given 10/14/12 1136)    Followed by  0.9 %  sodium chloride infusion (not administered)  naloxone (NARCAN) injection 0.4 mg (not administered)   1:08 PM patient remains somnolent. He is lethargic but does respond somewhat to verbal stimuli. His vital signs remained stable.  Patient is retracting his airway.  Procedures (including critical care time)  Labs Reviewed  COMPREHENSIVE METABOLIC PANEL - Abnormal; Notable for the following:    Glucose, Bld 108 (*)    AST 65 (*)    GFR calc non Af Amer 82 (*)    All other components within normal limits  URINE RAPID DRUG SCREEN (HOSP PERFORMED) - Abnormal; Notable for the following:    Cocaine POSITIVE (*)    Benzodiazepines POSITIVE (*)    Amphetamines POSITIVE (*)    All other components within normal limits  SALICYLATE LEVEL - Abnormal; Notable for the following:    Salicylate Lvl <2.0 (*)    All other components within normal limits  CBC WITH DIFFERENTIAL - Abnormal; Notable for the following:    RBC 3.94 (*)    Hemoglobin 12.6 (*)    HCT 36.3 (*)    Monocytes Relative 16 (*)    All other components within normal limits  ACETAMINOPHEN LEVEL  ETHANOL   No results found. 1. Drug overdose, initial encounter     MDM  The patient presented to the emergency room after a drug overdose. Laboratory tests show that his urine drug screen is positive for  amphetamines, benzodiazepines and cocaine. patient's symptoms are most consistent with either a benzodiazepine overdose or opiate overdose.  Patient also does have trazodone and Geodon at home. He may have also ingested those medications. We'll continue cardiac monitoring. Patient ultimately need psychiatric evaluation once he is medically stable. Considering his persistent somnolence, I will consult the medical service regarding overnight admission for observation.  Celene Kras, MD 10/14/12 1311

## 2012-10-14 NOTE — H&P (Signed)
[facility] History and Physical  Brandon [name] ZOX:[id number] DOB: 11-24-1965 DOA: 10/14/2012  Referring physician: EDP PCP: No primary provider on file.   Chief Complaint: Overdose  HPI: Brandon [name] is a 47 y.o. male with longs history of depression, Bipolar disorder, polysubstance abuse and prior suicide attempts was brought to ER by EMS, per information passed on by EDP pt has been increasingly depressed about lack of a job and recent breakup with girlfriend and called his father this morning stating that he wanted to kill himself, subsequently EMS arrived and found him obtunded and unresponsive. He was given 1 dose of narcan and pt subsequently admitted to trying to commit suicide by taking Oxycodone, cocaine and Geodon. He is very lethargic at this time, but able to protect his airway and is unable to specify the amount consumed.   Review of Systems: The patient denies anorexia, fever, weight loss,, vision loss, decreased hearing, hoarseness, chest pain, syncope, dyspnea on exertion, peripheral edema, balance deficits, hemoptysis, abdominal pain, melena, hematochezia, severe indigestion/heartburn, hematuria, incontinence, genital sores, muscle weakness, suspicious skin lesions, transient blindness, difficulty walking, depression, unusual weight change, abnormal bleeding, enlarged lymph nodes, angioedema, and breast masses.    Past Medical History  Diagnosis Date  . Drug abuse   . Kidney failure   . Bipolar 1 disorder   . Rhabdomyolysis   . Acute renal failure   . Opiate abuse, continuous   . Dental caries     extensive  . Anxiety   . Depression   . Bipolar affective disorder 03/17/2012  . Mood disorder 03/16/2012   Past Surgical History  Procedure Laterality Date  . Knee surgery     Social History:  reports that he has never smoked. He has never used smokeless tobacco. He reports that he uses illicit drugs (Oxycodone, Amphetamines, Barbituates, Cocaine, and  Methamphetamines). He reports that he does not drink alcohol. Lives at home with parents  No Known Allergies  Family History  Problem Relation Age of Onset  . Heart disease Mother   . Heart disease Father     Prior to Admission medications   Medication Sig Start Date End Date Taking? Authorizing Provider  folic acid (FOLVITE) 1 MG tablet Take 1 tablet (1 mg total) by mouth daily. For Folic acid replacement 03/18/12   Sanjuana Kava, NP  hydrOXYzine (ATARAX/VISTARIL) 50 MG tablet Take 1 tablet (50 mg total) by mouth every 6 (six) hours as needed for anxiety. 06/03/12   Nanine Means, NP  Multiple Vitamin (MULTIVITAMIN WITH MINERALS) TABS Take 1 tablet by mouth daily. For vitamin replacement 03/18/12   Sanjuana Kava, NP  traZODone (DESYREL) 100 MG tablet Take 1 tablet (100 mg total) by mouth at bedtime as needed for sleep. 06/03/12   Nanine Means, NP  traZODone (DESYREL) 50 MG tablet Take 1 tablet (50 mg total) by mouth at bedtime as needed and may repeat dose one time if needed for sleep. For sleep 03/18/12   Sanjuana Kava, NP  ziprasidone (GEODON) 60 MG capsule Take 1 capsule (60 mg total) by mouth 2 (two) times daily with a meal. For mood control 03/18/12   Sanjuana Kava, NP  ziprasidone (GEODON) 60 MG capsule Take 1 capsule (60 mg total) by mouth 2 (two) times daily with a meal. 06/03/12   Nanine Means, NP   Physical Exam: Filed Vitals:   10/14/12 1421  BP: 110/69  Pulse: 66  Temp: 97.6 F (36.4 C)  Resp: 18  General: lethargic, obtunded, arousible to verbal and painful stimuli  HEENT: pin point pupils, oral mucosa moist and pink  Cardiovascular:S1S2/RRR  Respiratory:CTAB  Abdomen: soft, Nt, BS present  Skin: no rashes  Musculoskeletal: no edema c/c  Psychiatric: unable to assess due to mentation  Neurologic: lethargic, pin point pupils, arousible, otherwise non focal  Labs on Admission:  Basic Metabolic Panel:  Recent Labs Lab 10/14/12 1146  NA 140  K 3.9  CL 104   CO2 28  GLUCOSE 108*  BUN 16  CREATININE 1.06  CALCIUM 8.6   Liver Function Tests:  Recent Labs Lab 10/14/12 1146  AST 65*  ALT 26  ALKPHOS 53  BILITOT 0.5  PROT 6.4  ALBUMIN 3.6   No results found for this basename: LIPASE, AMYLASE,  in the last 168 hours No results found for this basename: AMMONIA,  in the last 168 hours CBC:  Recent Labs Lab 10/14/12 1146  WBC 5.8  NEUTROABS 3.9  HGB 12.6*  HCT 36.3*  MCV 92.1  PLT 233   Cardiac Enzymes: No results found for this basename: CKTOTAL, CKMB, CKMBINDEX, TROPONINI,  in the last 168 hours  BNP (last 3 results)  Recent Labs  05/25/12 1825  PROBNP 43.1   CBG: No results found for this basename: GLUCAP,  in the last 168 hours  Radiological Exams on Admission: No results found.  EKG: Independently reviewed.Borderline prolonged QTC but <480  Assessment/Plan Active Problems:   Overdose   OPIOID DEPENDENCE   COCAINE ABUSE   Suicidal ideation   Substance abuse   Bipolar affective disorder   1. Overdose/Suicide attempt -multiple medications involved, Oxycodone, Geodon, cocaine -supportive care, NPO, IVF till mentation improves -able to protect airway -Telemetry monitoring -1:1 sitter -Psychiatry consulted  2. Bipolar disorder -hold geodon at current time till mentation improved -Psych consult as noted above  3. Borderline prolonged QTC <464msec -due to geodon OD -monitor on Tele, EKG in am  Code Status:Presumed Full Family Communication:none at bedside Disposition Plan: inpatient Time spent :  [facility] Pager [phone]  If 7PM-7AM, please contact night-coverage [email or web address] Password [facility] 10/14/2012, 2:24 PM

## 2012-10-14 NOTE — Progress Notes (Signed)
UR completed 

## 2012-10-14 NOTE — ED Notes (Signed)
Bed: RESA Expected date:  Expected time:  Means of arrival:  Comments: EMS/overdose 

## 2012-10-14 NOTE — BHH Counselor (Signed)
Writer submitted the information for a consult for Dr. Jomarie Longs.  The doctors pager number is [phone].

## 2012-10-15 DIAGNOSIS — F39 Unspecified mood [affective] disorder: Secondary | ICD-10-CM

## 2012-10-15 DIAGNOSIS — T50901A Poisoning by unspecified drugs, medicaments and biological substances, accidental (unintentional), initial encounter: Secondary | ICD-10-CM

## 2012-10-15 DIAGNOSIS — F319 Bipolar disorder, unspecified: Secondary | ICD-10-CM

## 2012-10-15 DIAGNOSIS — F1410 Cocaine abuse, uncomplicated: Secondary | ICD-10-CM

## 2012-10-15 LAB — CBC
MCHC: 33.3 g/dL (ref 30.0–36.0)
Platelets: 240 10*3/uL (ref 150–400)
RDW: 13 % (ref 11.5–15.5)

## 2012-10-15 LAB — BASIC METABOLIC PANEL
GFR calc Af Amer: 85 mL/min — ABNORMAL LOW (ref >90)
GFR calc non Af Amer: 73 mL/min — ABNORMAL LOW (ref >90)
Potassium: 3.8 mEq/L (ref 3.5–5.1)
Sodium: 139 mEq/L (ref 135–145)

## 2012-10-15 LAB — MRSA PCR SCREENING: MRSA by PCR: POSITIVE — AB

## 2012-10-15 MED ORDER — DEXTROSE-NACL 5-0.45 % IV SOLN
INTRAVENOUS | Status: DC
  Administered 2012-10-15 (×2): via INTRAVENOUS

## 2012-10-15 MED ORDER — MUPIROCIN 2 % EX OINT
1.0000 "application " | TOPICAL_OINTMENT | Freq: Two times a day (BID) | CUTANEOUS | Status: DC
  Administered 2012-10-15 – 2012-10-16 (×3): 1 via NASAL
  Filled 2012-10-15: qty 22

## 2012-10-15 MED ORDER — CHLORHEXIDINE GLUCONATE CLOTH 2 % EX PADS
6.0000 | MEDICATED_PAD | Freq: Every day | CUTANEOUS | Status: DC
  Administered 2012-10-15 – 2012-10-16 (×2): 6 via TOPICAL

## 2012-10-15 MED ORDER — TAMSULOSIN HCL 0.4 MG PO CAPS
0.4000 mg | ORAL_CAPSULE | Freq: Once | ORAL | Status: AC
  Administered 2012-10-15: 0.4 mg via ORAL
  Filled 2012-10-15: qty 1

## 2012-10-15 NOTE — Progress Notes (Signed)
[facility] PROGRESS NOTE  Brandon [name] NWG:[id number] DOB: 1966/01/24 DOA: 10/14/2012 PCP: No primary provider on file.  Assessment/Plan: 1. Overdose/Suicide attempt  -multiple medications involved, Oxycodone, Geodon, cocaine  -mentation improved back to baseline -Telemetry monitoring  -1:1 sitter  -Psychiatry consult pending  2. Bipolar disorder  -hold geodon  -Psych consult as noted above   3. Borderline prolonged QTC  <425msec  -due to geodon OD  - EKG with QTC 475, avoid adding any QT prolonging psych meds, Geodon stopped  4. Urinary retention,  -requiring straight cath last night, likely from OD -flomax x1 today and monitor  Code Status: Full  Family Communication: none at bedside Disposition Plan: per Psychiatry   Consultants:  Psychiatry pending  HPI/Subjective: Upset that his [place] was cut up when he was in ER  Objective: Filed Vitals:   10/15/12 1413  BP: 107/67  Pulse: 67  Temp: 98.2 F (36.8 C)  Resp: 18    Intake/Output Summary (Last 24 hours) at 10/15/12 1431 Last data filed at 10/15/12 1153  Gross per 24 hour  Intake 2273.33 ml  Output    950 ml  Net 1323.33 ml   Filed Weights   10/14/12 1550  Weight: 101.3 kg (223 lb 5.2 oz)    Exam:   General: AAOx3  Cardiovascular: S1S2/RRR  Respiratory:CTAB  Abdomen: soft, NT, BS present  Musculoskeletal: no edema c/c  Data Reviewed: Basic Metabolic Panel:  Recent Labs Lab 10/14/12 1146 10/15/12 0505  NA 140 139  K 3.9 3.8  CL 104 103  CO2 28 27  GLUCOSE 108* 98  BUN 16 14  CREATININE 1.06 1.17  CALCIUM 8.6 8.7   Liver Function Tests:  Recent Labs Lab 10/14/12 1146  AST 65*  ALT 26  ALKPHOS 53  BILITOT 0.5  PROT 6.4  ALBUMIN 3.6   No results found for this basename: LIPASE, AMYLASE,  in the last 168 hours No results found for this basename: AMMONIA,  in the last 168 hours CBC:  Recent Labs Lab 10/14/12 1146 10/15/12 0505  WBC 5.8 5.0  NEUTROABS 3.9   --   HGB 12.6* 12.2*  HCT 36.3* 36.6*  MCV 92.1 93.4  PLT 233 240   Cardiac Enzymes: No results found for this basename: CKTOTAL, CKMB, CKMBINDEX, TROPONINI,  in the last 168 hours BNP (last 3 results)  Recent Labs  05/25/12 1825  PROBNP 43.1   CBG: No results found for this basename: GLUCAP,  in the last 168 hours  Recent Results (from the past 240 hour(s))  MRSA PCR SCREENING     Status: Abnormal   Collection Time    10/14/12  5:13 PM      Result Value Range Status   MRSA by PCR POSITIVE (*) NEGATIVE Final   Comment:            The GeneXpert MRSA Assay (FDA     approved for NASAL specimens     only), is one component of a     comprehensive MRSA colonization     surveillance program. It is not     intended to diagnose MRSA     infection nor to guide or     monitor treatment for     MRSA infections.     Performed at [facility]     Performed at [facility]     Studies: No results found.  Scheduled Meds: . Chlorhexidine Gluconate Cloth  6 each Topical Q0600  . enoxaparin (LOVENOX)  injection  40 mg Subcutaneous Q24H  . folic acid  1 mg Oral Daily  . multivitamin with minerals  1 tablet Oral Daily  . mupirocin ointment  1 application Nasal BID   Continuous Infusions: . dextrose 5 % and 0.45% NaCl 100 mL/hr at 10/15/12 1139    Active Problems:   Overdose   OPIOID DEPENDENCE   COCAINE ABUSE   Suicidal ideation   Substance abuse   Bipolar affective disorder    Time spent:3min    [facility]  [facility] Pager [phone]. If 7PM-7AM, please contact night-coverage at [email or web address], password [facility] 10/15/2012, 2:31 PM  LOS: 1 day

## 2012-10-15 NOTE — Progress Notes (Signed)
mrsa screen came back positive.  Placed pt on isolation, did teaching with suicide sitter, orders placed for mrsa treatment.

## 2012-10-15 NOTE — Progress Notes (Signed)
Clinical Social Work Department CLINICAL SOCIAL WORK PSYCHIATRY SERVICE LINE ASSESSMENT 10/15/2012  Patient:  Brandon [name]  Account:  [id number]  Admit Date:  10/14/2012  Clinical Social Worker:  Unk Lightning, LCSW  Date/Time:  10/15/2012 01:30 PM Referred by:  Physician  Date referred:  10/15/2012 Reason for Referral  Psychosocial assessment   Presenting Symptoms/Problems (In the person's/family's own words):   Psych consulted due to suicide attempt.   Abuse/Neglect/Trauma History (check all that apply)  Denies history   Abuse/Neglect/Trauma Comments:   Psychiatric History (check all that apply)  Outpatient treatment  Residential treatment  Inpatient/hospitilization   Psychiatric medications:  Geodon 60 mg   Current Mental Health Hospitalizations/Previous Mental Health History:   Patient reports he has bipolar disorder and has been feeling depressed lately. Patient uses street drugs and prescription drugs when feeling depressed. Patient recieves medication management through PCP at [facility].   Current provider:   [facility]   Place and Date:   [place], [place]   Current Medications:   traZODone            . Chlorhexidine Gluconate Cloth  6 each Topical Q0600  . enoxaparin (LOVENOX) injection  40 mg Subcutaneous Q24H  . folic acid  1 mg Oral Daily  . multivitamin with minerals  1 tablet Oral Daily  . mupirocin ointment  1 application Nasal BID   Previous Impatient Admission/Date/Reason:   Patient recently had a stay at [facility] in April 2014. Patient also reports several inpatient substance abuse treatment and a 18 month stay for substance abuse.   Emotional Health / Current Symptoms    Suicide/Self Harm  Suicide attempt in past (date/description)  Suicidal ideation (ex: "I can't take any more,I wish I could disappear")   Suicide attempt in the past:   Patient admitted after suicide attempt. Patient is upset that father called 911 and states he  wishes he was dead. Patient continues to voice SI and reports several plans including cutting his wrists or overdosing on medication.   Other harmful behavior:   None reported   Psychotic/Dissociative Symptoms  Auditory Hallucinations   Other Psychotic/Dissociative Symptoms:   Patient reports some hallucinations but feels this is related to withdrawing.    Attention/Behavioral Symptoms  Restless   Other Attention / Behavioral Symptoms:   Patient minimally engaged and restless throughout assessment.    Cognitive Impairment  Orientation - Place  Orientation - Self  Orientation - Situation  Orientation - Time   Other Cognitive Impairment:    Mood and Adjustment  Guarded    Stress, Anxiety, Trauma, Any Recent Loss/Stressor  Relationship   Anxiety (frequency):   N/A   Phobia (specify):   N/A   Compulsive behavior (specify):   N/A   Obsessive behavior (specify):   N/A   Other:   Patient reports that his mom hates him. Patient reports strained relationship with dad. Patient reports he and fiance broke up a couple of months ago and she moved to [place] and is dating someone else.   Substance Abuse/Use  Current substance use   SBIRT completed (please refer for detailed history):  Y  Self-reported substance use:   Patient reports he uses alcohol, cocaine, OxyContin, and other prescription drugs. Patient reports he is addicted to Oxy but reports that he rarely uses other drugs. Patient does not feel that substance use is an issue and does not want treatment.   Urinary Drug Screen Completed:  Y Alcohol level:   <  11    Environmental/Housing/Living Arrangement  With Family Member   Who is in the home:   Mom and dad   Emergency contact:  Patient does not want anyone listed at this time   Financial  IPRS   Patient's Strengths and Goals (patient's own words):   Patient struggles to identify any strengths but does report that his dad cares about him.    Clinical Social Worker's Interpretive Summary:   CSW received referral in order to complete psychosocial assessment. CSW reviewed chart and met with patient at bedside. CSW introduced myself and explained role.    Patient reports that he was at home and was not getting along with his parents and felt he needed to go get high. Patient left parent's house and went to a hotel for a night. Patient reports that he came home and took 5 Geodon pills, used cocaine, and tried to cut his wrists. Patient reports that father discovered him and called 911. Patient is upset that 911 was called is upset that he is still alive.    Patient reports that he has several stressors in life and several triggers to try and harm himself. Patient is upset that his fiance left him and moved to [place]. Patient reports that he is going to get fired from his job. Patient also reports financial stress due to using money to buy OxyContin.    Patient reports he went to [facility] in April but reports no outpatient follow up since [facility]. Patient reports he does not have insurance and therefore did not have any follow up. Patient reports that his PCP prescribes his Geodon and he feels it is effective.    Patient is guarded throughout assessment with limited engagement. Patient continues to report SI and is unable to contract for safety. CSW will continue to follow and will assist with recommendations provided by psych MD.   Disposition:  Recommend Psych CSW continuing to support while in hospital

## 2012-10-16 ENCOUNTER — Inpatient Hospital Stay (HOSPITAL_COMMUNITY)
Admission: AD | Admit: 2012-10-16 | Discharge: 2012-10-24 | DRG: 885 | Disposition: A | Discharge status: Home / Self Care | Payer: No Typology Code available for payment source | Source: Intra-hospital | Attending: Psychiatry | Admitting: Psychiatry

## 2012-10-16 ENCOUNTER — Encounter (HOSPITAL_COMMUNITY): Payer: Self-pay | Admitting: *Deleted

## 2012-10-16 ENCOUNTER — Telehealth (HOSPITAL_COMMUNITY): Payer: Self-pay | Admitting: *Deleted

## 2012-10-16 DIAGNOSIS — F319 Bipolar disorder, unspecified: Secondary | ICD-10-CM | POA: Diagnosis present

## 2012-10-16 DIAGNOSIS — F411 Generalized anxiety disorder: Secondary | ICD-10-CM | POA: Diagnosis present

## 2012-10-16 DIAGNOSIS — F3130 Bipolar disorder, current episode depressed, mild or moderate severity, unspecified: Principal | ICD-10-CM | POA: Diagnosis present

## 2012-10-16 DIAGNOSIS — F1410 Cocaine abuse, uncomplicated: Secondary | ICD-10-CM | POA: Diagnosis present

## 2012-10-16 DIAGNOSIS — Z79899 Other long term (current) drug therapy: Secondary | ICD-10-CM

## 2012-10-16 DIAGNOSIS — F1120 Opioid dependence, uncomplicated: Secondary | ICD-10-CM

## 2012-10-16 DIAGNOSIS — F39 Unspecified mood [affective] disorder: Secondary | ICD-10-CM

## 2012-10-16 DIAGNOSIS — F1910 Other psychoactive substance abuse, uncomplicated: Secondary | ICD-10-CM

## 2012-10-16 DIAGNOSIS — Z5189 Encounter for other specified aftercare: Secondary | ICD-10-CM

## 2012-10-16 DIAGNOSIS — R45851 Suicidal ideations: Secondary | ICD-10-CM

## 2012-10-16 MED ORDER — TRAZODONE HCL 50 MG PO TABS
50.0000 mg | ORAL_TABLET | Freq: Every evening | ORAL | Status: DC | PRN
  Administered 2012-10-16 – 2012-10-22 (×11): 50 mg via ORAL
  Filled 2012-10-16 (×17): qty 1

## 2012-10-16 MED ORDER — MAGNESIUM HYDROXIDE 400 MG/5ML PO SUSP: 30.0000 mL | Freq: Every day | ORAL | Status: DC | PRN

## 2012-10-16 MED ORDER — CLONIDINE HCL 0.1 MG PO TABS
0.1000 mg | ORAL_TABLET | Freq: Every day | ORAL | Status: DC
  Filled 2012-10-16: qty 1

## 2012-10-16 MED ORDER — CHLORDIAZEPOXIDE HCL 25 MG PO CAPS
25.0000 mg | ORAL_CAPSULE | ORAL | Status: AC
  Administered 2012-10-19 (×2): 25 mg via ORAL
  Filled 2012-10-16 (×2): qty 1

## 2012-10-16 MED ORDER — ONDANSETRON 4 MG PO TBDP: 4.0000 mg | ORAL_TABLET | Freq: Four times a day (QID) | ORAL | Status: AC | PRN

## 2012-10-16 MED ORDER — ACETAMINOPHEN 325 MG PO TABS
650.0000 mg | ORAL_TABLET | Freq: Four times a day (QID) | ORAL | Status: DC | PRN
  Administered 2012-10-16 – 2012-10-23 (×3): 650 mg via ORAL

## 2012-10-16 MED ORDER — HYDROXYZINE HCL 25 MG PO TABS: 25.0000 mg | ORAL_TABLET | Freq: Four times a day (QID) | ORAL | Status: DC | PRN

## 2012-10-16 MED ORDER — ADULT MULTIVITAMIN W/MINERALS CH: 1.0000 | ORAL_TABLET | Freq: Every day | ORAL | Status: DC

## 2012-10-16 MED ORDER — CLONIDINE HCL 0.1 MG PO TABS
0.1000 mg | ORAL_TABLET | Freq: Four times a day (QID) | ORAL | Status: AC
  Administered 2012-10-16 – 2012-10-18 (×9): 0.1 mg via ORAL
  Filled 2012-10-16 (×11): qty 1

## 2012-10-16 MED ORDER — CHLORDIAZEPOXIDE HCL 25 MG PO CAPS
50.0000 mg | ORAL_CAPSULE | Freq: Once | ORAL | Status: DC
  Filled 2012-10-16: qty 2

## 2012-10-16 MED ORDER — DICYCLOMINE HCL 20 MG PO TABS: 20.0000 mg | ORAL_TABLET | Freq: Four times a day (QID) | ORAL | Status: AC | PRN

## 2012-10-16 MED ORDER — CLONIDINE HCL 0.1 MG PO TABS
0.1000 mg | ORAL_TABLET | ORAL | Status: DC
  Administered 2012-10-19: 0.1 mg via ORAL
  Filled 2012-10-16 (×4): qty 1

## 2012-10-16 MED ORDER — TRAZODONE HCL 50 MG PO TABS: 50.0000 mg | ORAL_TABLET | Freq: Every evening | ORAL | Status: DC | PRN

## 2012-10-16 MED ORDER — VITAMIN B-1 100 MG PO TABS
100.0000 mg | ORAL_TABLET | Freq: Every day | ORAL | Status: DC
  Administered 2012-10-17 – 2012-10-24 (×8): 100 mg via ORAL
  Filled 2012-10-16 (×10): qty 1

## 2012-10-16 MED ORDER — ALUM & MAG HYDROXIDE-SIMETH 200-200-20 MG/5ML PO SUSP: 30.0000 mL | ORAL | Status: DC | PRN

## 2012-10-16 MED ORDER — NAPROXEN 500 MG PO TABS
500.0000 mg | ORAL_TABLET | Freq: Two times a day (BID) | ORAL | Status: AC | PRN
  Administered 2012-10-16 – 2012-10-20 (×4): 500 mg via ORAL
  Filled 2012-10-16 (×5): qty 1

## 2012-10-16 MED ORDER — FOLIC ACID 1 MG PO TABS: 1.0000 mg | ORAL_TABLET | Freq: Every day | ORAL | Status: DC

## 2012-10-16 MED ORDER — CHLORDIAZEPOXIDE HCL 25 MG PO CAPS
25.0000 mg | ORAL_CAPSULE | Freq: Four times a day (QID) | ORAL | Status: AC
  Administered 2012-10-16 – 2012-10-17 (×4): 25 mg via ORAL
  Filled 2012-10-16 (×5): qty 1

## 2012-10-16 MED ORDER — ADULT MULTIVITAMIN W/MINERALS CH
1.0000 | ORAL_TABLET | Freq: Every day | ORAL | Status: DC
  Administered 2012-10-17 – 2012-10-24 (×8): 1 via ORAL
  Filled 2012-10-16 (×11): qty 1

## 2012-10-16 MED ORDER — THIAMINE HCL 100 MG/ML IJ SOLN: 100.0000 mg | Freq: Once | INTRAMUSCULAR | Status: DC

## 2012-10-16 MED ORDER — LOPERAMIDE HCL 2 MG PO CAPS: 2.0000 mg | ORAL_CAPSULE | ORAL | Status: AC | PRN

## 2012-10-16 MED ORDER — CHLORDIAZEPOXIDE HCL 25 MG PO CAPS
25.0000 mg | ORAL_CAPSULE | Freq: Four times a day (QID) | ORAL | Status: AC | PRN
  Administered 2012-10-16 – 2012-10-17 (×2): 25 mg via ORAL
  Filled 2012-10-16: qty 1

## 2012-10-16 MED ORDER — CHLORDIAZEPOXIDE HCL 25 MG PO CAPS
25.0000 mg | ORAL_CAPSULE | Freq: Three times a day (TID) | ORAL | Status: AC
  Administered 2012-10-18 (×3): 25 mg via ORAL
  Filled 2012-10-16 (×2): qty 1

## 2012-10-16 MED ORDER — METHOCARBAMOL 500 MG PO TABS
500.0000 mg | ORAL_TABLET | Freq: Three times a day (TID) | ORAL | Status: AC | PRN
  Administered 2012-10-19 – 2012-10-20 (×3): 500 mg via ORAL
  Filled 2012-10-16 (×4): qty 1

## 2012-10-16 MED ORDER — CHLORDIAZEPOXIDE HCL 25 MG PO CAPS
25.0000 mg | ORAL_CAPSULE | Freq: Every day | ORAL | Status: AC
  Administered 2012-10-20: 25 mg via ORAL
  Filled 2012-10-16: qty 1

## 2012-10-16 NOTE — Progress Notes (Signed)
Security phoned to transport patient to [facility]

## 2012-10-16 NOTE — Progress Notes (Signed)
Report called to [facility], patient is stable, sitter is at bedside patient would like to eat lunch before being transferred to [facility].

## 2012-10-16 NOTE — Progress Notes (Signed)
Clinical Social Work  Patient has been accepted to [facility]. Patient signed voluntary form which was faxed to [facility] and original copy was placed on chart. Patient aware of DC and agreeable to plan. RN called report to [phone] and coordinated security to transport. CSW is signing off but available if needed.  [place], [place] [phone]

## 2012-10-16 NOTE — Discharge Summary (Signed)
Physician Discharge Summary  LAMICHAEL YOUKHANA [name]:[id number] DOB: November 12, 1965 DOA: 10/14/2012  PCP: No primary provider on file.  Admit date: 10/14/2012 Discharge date: 10/16/2012  Time spent: 40 minutes  Recommendations for Outpatient Follow-up:  1. PCP in 1 week  Discharge Diagnoses:  Active Problems:   Overdose   OPIOID DEPENDENCE   COCAINE ABUSE   Suicidal ideation   Substance abuse   Bipolar affective disorder   Discharge Condition: stable  Diet recommendation: regular  Filed Weights   10/14/12 1550  Weight: 101.3 kg (223 lb 5.2 oz)    History of present illness:  Brandon [name] is a 47 y.o. male with longs history of depression, Bipolar disorder, polysubstance abuse and prior suicide attempts was brought to ER by EMS, per information passed on by EDP pt has been increasingly depressed about lack of a job and recent breakup with girlfriend and called his father this morning stating that he wanted to kill himself, subsequently EMS arrived and found him obtunded and unresponsive. He was given 1 dose of narcan and pt subsequently admitted to trying to commit suicide by taking Oxycodone, cocaine and Geodon.  He was very lethargic on admission, but able to protect his airway and is unable to specify the amount consumed.    Hospital Course:  1. Overdose/Suicide attempt  -multiple medications involved, Oxycodone, Geodon, cocaine  -mentation improved back to baseline by next morning -1:1 sitter  -Psychiatry recommended inpatient Psychiatry today  2. Bipolar disorder  -hold geodon  -Psych to determine meds, Geodon stopped due to borderline prolonged QTC and OD using this.  3. Borderline prolonged QTC  <467msec  -due to geodon OD  - EKG with QTC <475, avoid adding any QT prolonging psych meds, Geodon stopped   4. Urinary retention,  -due to OD, resolved    Consultations:  Psychiatry  Discharge Exam: Filed Vitals:   10/15/12 2201  BP: 103/65  Pulse: 61  Temp: 97.7 F  (36.5 C)  Resp: 16    General: AAOx3 Cardiovascular: S1S2/RRR Respiratory: CTAB  Discharge Instructions  Discharge Orders   Future Orders Complete By Expires   Increase activity slowly  As directed        Medication List    STOP taking these medications       ziprasidone 60 MG capsule  Commonly known as:  GEODON     zolpidem 10 MG tablet  Commonly known as:  AMBIEN      TAKE these medications       acetaminophen 500 MG tablet  Commonly known as:  TYLENOL  Take 1,000 mg by mouth every 6 (six) hours as needed for pain.     folic acid 1 MG tablet  Commonly known as:  FOLVITE  Take 1 tablet (1 mg total) by mouth daily.     multivitamin with minerals Tabs tablet  Take 1 tablet by mouth daily.     traZODone 50 MG tablet  Commonly known as:  DESYREL  Take 1 tablet (50 mg total) by mouth at bedtime as needed and may repeat dose one time if needed for sleep. For sleep       No Known Allergies    The results of significant diagnostics from this hospitalization (including imaging, microbiology, ancillary and laboratory) are listed below for reference.    Significant Diagnostic Studies: No results found.  Microbiology: Recent Results (from the past 240 hour(s))  MRSA PCR SCREENING     Status: Abnormal   Collection Time  10/14/12  5:13 PM      Result Value Range Status   MRSA by PCR POSITIVE (*) NEGATIVE Final   Comment:            The GeneXpert MRSA Assay (FDA     approved for NASAL specimens     only), is one component of a     comprehensive MRSA colonization     surveillance program. It is not     intended to diagnose MRSA     infection nor to guide or     monitor treatment for     MRSA infections.     Performed at [facility]     Performed at [facility]     Labs: Basic Metabolic Panel:  Recent Labs Lab 10/14/12 1146 10/15/12 0505  NA 140 139  K 3.9 3.8  CL 104 103  CO2 28 27  GLUCOSE 108* 98  BUN 16 14  CREATININE  1.06 1.17  CALCIUM 8.6 8.7   Liver Function Tests:  Recent Labs Lab 10/14/12 1146  AST 65*  ALT 26  ALKPHOS 53  BILITOT 0.5  PROT 6.4  ALBUMIN 3.6   No results found for this basename: LIPASE, AMYLASE,  in the last 168 hours No results found for this basename: AMMONIA,  in the last 168 hours CBC:  Recent Labs Lab 10/14/12 1146 10/15/12 0505  WBC 5.8 5.0  NEUTROABS 3.9  --   HGB 12.6* 12.2*  HCT 36.3* 36.6*  MCV 92.1 93.4  PLT 233 240   Cardiac Enzymes: No results found for this basename: CKTOTAL, CKMB, CKMBINDEX, TROPONINI,  in the last 168 hours BNP: BNP (last 3 results)  Recent Labs  05/25/12 1825  PROBNP 43.1   CBG: No results found for this basename: GLUCAP,  in the last 168 hours     Signed:  Carrie Usery  [facility] 10/16/2012, 11:14 AM

## 2012-10-16 NOTE — BH Assessment (Addendum)
Assessment Note  Brandon [name] is an 47 y.o. single white male.  On or about 10/14/2012 he presented at [facility] by EMS, and was admitted to a medical floor following a suicide attempt by overdose.  He had also attempted to cut his wrist.  Pt seen in consult by Leata Mouse, MD on 10/14/2012.  His consult note reports the following:  "HPI: Brandon [name] is a 47 y.o. male with longs history of depression, Bipolar disorder, polysubstance abuse and prior suicide attempts was admitted to the [facility] long hospital medical floor for intentional drug overdose. Reportedly he has been increasingly depressed about lack of a job and recent breakup with girlfriend and called his father this morning stating that he wanted to kill himself. Subsequently EMS arrived and found him obtunded and unresponsive. He is admitted to trying to commit suicide by taking Oxycodone, cocaine and Geodon. Patient to urine drug screen is positive for cocaine benzodiazepines and amphetamines. Patient has at least 2 previous psychiatric admissions in the [facility], April and January of 2014 for bipolar disorder and also substance abuse versus intoxication.  "MSE: Patient was under sedation and unable to complete the mental status examination at this time.   "Social History: reports that he has never smoked. He has never used smokeless tobacco. He reports that he uses illicit drugs (Oxycodone, Amphetamines, Barbituates, Cocaine, and Methamphetamines). He reports that he does not drink alcohol.  "Positive for bad mood, behavior problems, bipolar, excessive alcohol consumption, illegal drug usage, mood swings and sleep disturbance.  "Assessment/Plan:  Bipolar disorder, most recent episode is a depression  Polysubstance abuse versus dependence  Status post intentional overdose of medication drugs and medication"  On 10/15/2012 pt was seen by Unk Lightning, LCSW, who performed a Service Line Assessment.  Her Interpretive Summary reports the  following:  "CSW received referral in order to complete psychosocial assessment. CSW reviewed chart and met with patient at bedside. CSW introduced myself and explained role.     "Patient reports that he was at home and was not getting along with his parents and felt he needed to go get high. Patient left parent's house and went to a hotel for a night. Patient reports that he came home and took 38 Geodon pills, used cocaine, and tried to cut his wrists. Patient reports that father discovered him and called 911. Patient is upset that 911 was called is upset that he is still alive.     "Patient reports that he has several stressors in life and several triggers to try and harm himself. Patient is upset that his fiance left him and moved to [place]. Patient reports that he is going to get fired from his job. Patient also reports financial stress due to using money to buy OxyContin.     "Patient reports he went to [facility] in April but reports no outpatient follow up since [facility]. Patient reports he does not have insurance and therefore did not have any follow up. Patient reports that his PCP prescribes his Geodon and he feels it is effective.     "Patient is guarded throughout assessment with limited engagement. Patient continues to report SI and is unable to contract for safety. CSW will continue to follow and will assist with recommendations provided by psych MD."    Axis I: Bipolar Disorder NOS 296.80; Polysubstance Dependence 304.80 Axis II: Deferred 799.9 Axis III:  Past Medical History  Diagnosis Date  . Drug abuse   . Kidney failure   . Bipolar  1 disorder   . Rhabdomyolysis   . Acute renal failure   . Opiate abuse, continuous   . Dental caries     extensive  . Anxiety   . Depression   . Bipolar affective disorder 03/17/2012  . Mood disorder 03/16/2012   Axis IV: economic problems, housing problems, occupational problems and problems with primary support group Axis V: GAF = 30  Past Medical  History:  Past Medical History  Diagnosis Date  . Drug abuse   . Kidney failure   . Bipolar 1 disorder   . Rhabdomyolysis   . Acute renal failure   . Opiate abuse, continuous   . Dental caries     extensive  . Anxiety   . Depression   . Bipolar affective disorder 03/17/2012  . Mood disorder 03/16/2012    Past Surgical History  Procedure Laterality Date  . Knee surgery    . No past surgeries      hx 5 rt knee surgeries    Family History:  Family History  Problem Relation Age of Onset  . Heart disease Mother   . Heart disease Father     Social History:  reports that he has never smoked. He has never used smokeless tobacco. He reports that he uses illicit drugs (Oxycodone, Amphetamines, Barbituates, Cocaine, and Methamphetamines). He reports that he does not drink alcohol.  Additional Social History:  Alcohol / Drug Use Pain Medications: Oxycontin Prescriptions: Amphetamines, Barbiturates Over the Counter: None reported Substance #1 Name of Substance 1: Oxycontin (pt identifies this as his primary substance) 1 - Age of First Use: Unspecified 1 - Amount (size/oz): Unspecified 1 - Frequency: Unspecified 1 - Duration: Unspecified 1 - Last Use / Amount: Unspecified Substance #2 Name of Substance 2: Amphentamines/Methamphetamine 2 - Age of First Use: Unspecified 2 - Amount (size/oz): Unspecified 2 - Frequency: Unspecified 2 - Duration: Unspecified 2 - Last Use / Amount: Unspecified Substance #3 Name of Substance 3: Barbiturates 3 - Age of First Use: Unspecified 3 - Amount (size/oz): Unspecified 3 - Frequency: Unspecified 3 - Duration: Unspecified 3 - Last Use / Amount: Unspecified Substance #4 Name of Substance 4: Cocaine 4 - Age of First Use: Unspecified 4 - Amount (size/oz): Unspecified 4 - Frequency: Unspecified 4 - Duration: Unspecified 4 - Last Use / Amount: Unspecified Substance #5 Name of Substance 5: Alcohol (sometimes endorses, sometimes denies) 5 -  Age of First Use: Unspecified 5 - Amount (size/oz): Unspecified 5 - Frequency: Unspecified 5 - Duration: Unspecified 5 - Last Use / Amount: Unspecified  CIWA:   COWS:    Allergies:  Allergies  Allergen Reactions  . Bean Pod Extract     [place], kidney and pork&beans    Home Medications:  (Not in a hospital admission)  OB/GYN Status:  No LMP for male patient.  General Assessment Data Location of Assessment: [facility] Services Is this a Tele or Face-to-Face Assessment?:  (Telephone Call: [facility] medical floor transfer) Is this an Initial Assessment or a Re-assessment for this encounter?: Initial Assessment Living Arrangements: Parent (Father & Mother) Can pt return to current living arrangement?: Yes Admission Status: Voluntary Is patient capable of signing voluntary admission?: Yes Transfer from: Acute Hospital Referral Source: Medical Floor Inpatient ([facility] [place])  Medical Screening Exam [facility] Walk-in ONLY) Medical Exam completed: No Reason for MSE not completed:  (Medical floor transfer for admission to [facility].)  [facility] Crisis Care Plan Living Arrangements: Parent (Father & Mother) Name of Psychiatrist: None Name  of Therapist: None  Education Status Is patient currently in school?:  (Unknown) Highest grade of school patient has completed: Unknown  Risk to self Suicidal Ideation: Yes-Currently Present Suicidal Intent: Yes-Currently Present Is patient at risk for suicide?: Yes Suicidal Plan?: Yes-Currently Present Specify Current Suicidal Plan: Pt overdosed on 40 tabs of Geodon along with cocaine, and tried to cut his wrists Access to Means: Yes Specify Access to Suicidal Means: Prescription & street drugs, sharps What has been your use of drugs/alcohol within the last 12 months?: Abuses Oxycontin and many other substances. Previous Attempts/Gestures: Yes How many times?:  (Unspecified) Other Self Harm Risks: Regrets that father called 911, wishes to be dead,  cannot contract for safety. Triggers for Past Attempts: Unknown Intentional Self Injurious Behavior: Cutting Comment - Self Injurious Behavior: Cut wrists as part of current suicide attempt; otherwise, unknown Family Suicide History: Unknown Recent stressful life event(s): Loss (Comment);Conflict (Comment);Job Loss;Financial Problems (Recent break-up w/ fiance; expects job loss; family conflict) Persecutory voices/beliefs?: No Depression: Yes Depression Symptoms: Insomnia Substance abuse history and/or treatment for substance abuse?: Yes (Abuses Oxycontin and many other substances.) Suicide prevention information given to non-admitted patients: Not applicable  Risk to Others Homicidal Ideation: No (None reported) Thoughts of Harm to Others: No (None reported) Current Homicidal Intent: No (None reported) Current Homicidal Plan: No (None reported) Access to Homicidal Means: No (None reported) Identified Victim: None reported History of harm to others?: No (None reported) Assessment of Violence: None Noted Violent Behavior Description: Demeanor today is unspecified Does patient have access to weapons?:  (Unknown) Criminal Charges Pending?: No (None reported) Does patient have a court date: No (None reported)  Psychosis Hallucinations: Auditory (Reports AH, but believes it's related to withdrawal) Delusions: None noted (None reported)  Mental Status Report Appear/Hygiene: Other (Comment) (Unspecified) Eye Contact: Other (Comment) (Unspecified) Motor Activity: Restlessness Speech: Unable to assess Level of Consciousness: Unable to assess Mood: Depressed;Other (Comment) (Guarded) Affect: Other (Comment) (Guarded) Anxiety Level: None Thought Processes:  (Unknown) Judgement: Impaired Orientation: Person;Place;Time;Situation Obsessive Compulsive Thoughts/Behaviors: None  Cognitive Functioning Concentration: Decreased Memory:  (Unspecified) IQ: Average Insight: Poor Impulse  Control: Poor Appetite:  (Unspecified) Weight Loss:  (Unknown) Weight Gain:  (Unknown) Sleep:  ("Disturbance" NOS) Total Hours of Sleep:  (Unspecified) Vegetative Symptoms:  (Unspecified)     Prior Inpatient Therapy Prior Inpatient Therapy: Yes Prior Therapy Dates: 05/2012 & 02/2012: [facility] for bipolar disorder & substance abuse Prior Therapy Facilty/Provider(s): Other inpatient and residential rehab NOS  Prior Outpatient Therapy Prior Outpatient Therapy: No Prior Therapy Dates: Unknown, but did not follow up after last D/C from [facility].          Abuse/Neglect Assessment (Assessment to be complete while patient is alone) Physical Abuse: Denies Verbal Abuse: Denies Sexual Abuse: Denies Exploitation of patient/patient's resources: Denies Self-Neglect: Denies     Merchant navy officer (For Healthcare) Advance Directive:  (Unknown) Pre-existing out of facility DNR order (yellow form or pink MOST form):  (Unknown)    Additional Information 1:1 In Past 12 Months?: No CIRT Risk: No Elopement Risk: No Does patient have medical clearance?: Yes     Disposition:  Pt had been accepted to [facility] and had arrived prior to the start of this writer's shift.  Nanine Means, NP reported verbally that she had accepted pt.  Per Unk Lightning, LCSW's note, pt assigned to Rm 300-1 to the service of Geoffery Lyons, MD Disposition Initial Assessment Completed for this Encounter: Yes Disposition of Patient: Inpatient treatment program Type of inpatient treatment  program: Adult  On Site Evaluation by:   Reviewed with Physician:    Doylene Canning, MA Triage Specialist Raphael Gibney 10/16/2012 8:33 PM

## 2012-10-16 NOTE — Tx Team (Signed)
Initial Interdisciplinary Treatment Plan  PATIENT STRENGTHS: (choose at least two) Communication skills General fund of knowledge Physical Health Supportive family/friends Work skills  PATIENT STRESSORS: Substance abuse   PROBLEM LIST: Problem List/Patient Goals Date to be addressed Date deferred Reason deferred Estimated date of resolution  Substance abuse  10/16/12     depression 10/16/12                                                DISCHARGE CRITERIA:  Ability to meet basic life and health needs Adequate post-discharge living arrangements Improved stabilization in mood, thinking, and/or behavior Medical problems require only outpatient monitoring Motivation to continue treatment in a less acute level of care Need for constant or close observation no longer present Reduction of life-threatening or endangering symptoms to within safe limits Safe-care adequate arrangements made Verbal commitment to aftercare and medication compliance Withdrawal symptoms are absent or subacute and managed without 24-hour nursing intervention  PRELIMINARY DISCHARGE PLAN: Outpatient therapy Return to previous living arrangement  PATIENT/FAMIILY INVOLVEMENT: This treatment plan has been presented to and reviewed with the patient, Brandon [name], and/or family member, .  The patient and family have been given the opportunity to ask questions and make suggestions.  Beatrix Shipper 10/16/2012, 5:12 PM

## 2012-10-16 NOTE — Progress Notes (Signed)
Psychoeducational Group Note  Date:  10/16/2012 Time:  2000  Group Topic/Focus:  NA group  Participation Level: Did Not Attend  Participation Quality:  Not Applicable  Affect:  Not Applicable  Cognitive:  Not Applicable  Insight:  Not Applicable  Engagement in Group: Not Applicable  Additional Comments:    Abagail Limb R 10/16/2012, 8:52 PM  

## 2012-10-16 NOTE — Progress Notes (Signed)
Pt admitted voluntary following OD on oxycodone, geodon and cocaine. Pt also reports using heroin. Pt's father found unresponsive. Pt has been to [facility] before and had an OD attempt 4 months ago. Pt stressors include mood swings and girlfriend leaving.  Pt has grinded teeth from detoxing multiple times. He has pain all over on admission and moderate to severe tremors. 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 He is not active now with no open wounds. Pt lives with his parents and does not get along with his mother. He has two grown children that live in [place]. He has a hx of going to jail 3 yrs ago for growing marijuana. He denies si on admission to [facility].

## 2012-10-17 ENCOUNTER — Encounter (HOSPITAL_COMMUNITY): Payer: Self-pay | Admitting: Psychiatry

## 2012-10-17 MED ORDER — QUETIAPINE FUMARATE 200 MG PO TABS
200.0000 mg | ORAL_TABLET | Freq: Every day | ORAL | Status: DC
  Administered 2012-10-17 – 2012-10-18 (×2): 200 mg via ORAL
  Filled 2012-10-17 (×6): qty 1

## 2012-10-17 MED ORDER — QUETIAPINE FUMARATE 100 MG PO TABS
100.0000 mg | ORAL_TABLET | Freq: Two times a day (BID) | ORAL | Status: DC
  Administered 2012-10-17 – 2012-10-19 (×4): 100 mg via ORAL
  Filled 2012-10-17 (×9): qty 1

## 2012-10-17 NOTE — Progress Notes (Signed)
Adult Psychoeducational Group Note  Date:  10/17/2012 Time:  12:31 PM  Group Topic/Focus:  Building Self Esteem:   The Focus of this group is helping patients become aware of the effects of self-esteem on their lives, the things they and others do that enhance or undermine their self-esteem, seeing the relationship between their level of self-esteem and the choices they make and learning ways to enhance self-esteem.  Participation Level:  Did Not Attend   Brandon [name] 10/17/2012, 12:31 PM

## 2012-10-17 NOTE — [facility] Suicide Risk Assessment (Signed)
Suicide Risk Assessment  Admission Assessment     Nursing information obtained from:  Patient Demographic factors:  Male;Adolescent or young adult;Caucasian;Unemployed Current Mental Status:  NA Loss Factors:  Loss of significant relationship;Financial problems / change in socioeconomic status Historical Factors:  Prior suicide attempts;Impulsivity Risk Reduction Factors:  Living with another person, especially a relative  CLINICAL FACTORS:   Bipolar Disorder:   Depressive phase Alcohol/Substance Abuse/Dependencies  COGNITIVE FEATURES THAT CONTRIBUTE TO RISK:  Closed-mindedness Polarized thinking Thought constriction (tunnel vision)    SUICIDE RISK:   Moderate:  Frequent suicidal ideation with limited intensity, and duration, some specificity in terms of plans, no associated intent, good self-control, limited dysphoria/symptomatology, some risk factors present, and identifiable protective factors, including available and accessible social support.  PLAN OF CARE: Supportive approach/coping skills/relapse prevention                               Detox/reassess co morbidities                               Trial with Seroquel/Lithium  I certify that inpatient services furnished can reasonably be expected to improve the patient's condition.  Frederic Tones A 10/17/2012, 4:03 PM

## 2012-10-17 NOTE — [facility] Group Notes (Signed)
[facility] LCSW Group Therapy  10/17/2012 2:57 PM  Type of Therapy:  Group Therapy  Participation Level:  Did Not Attend  [name], Brandon Seta 10/17/2012, 2:57 PM

## 2012-10-17 NOTE — [facility] Counselor (Signed)
Adult Psychosocial Assessment Update Interdisciplinary Team  Previous [facility] admissions/discharges:  Admissions Discharges  Date: 10/16/12 Date: unknown  Date: 05/30/12 Date: 06/03/12  Date: 03/16/12 Date: 03/18/12  Date: Date:  Date: Date:   Changes since the last Psychosocial Assessment (including adherence to outpatient mental health and/or substance abuse treatment, situational issues contributing to decompensation and/or relapse). HPI: Brandon [name] is a 47 y.o. male with longs history of depression, Bipolar disorder, polysubstance abuse and prior suicide attempts was brought to ER by EMS, per information passed on by EDP pt has been increasingly depressed about lack of a job and recent breakup with girlfriend and called his father stating that he wanted to kill himself, subsequently EMS arrived and found him obtunded and unresponsive. He was given 1 dose of narcan and pt subsequently admitted to trying to commit suicide by taking Oxycodone, cocaine and Geodon. He is very lethargic at this time, but able to protect his airway and is unable to specify the amount consumed. He reports that he does not drink alcohol. Lives at home with parents              Discharge Plan 1. Will you be returning to the same living situation after discharge?   Yes: unknown No:      If no, what is your plan?    CSW assessing for appropriate referrals at this time.        2. Would you like a referral for services when you are discharged? Yes:     If yes, for what services?  No:       CSW assessing for appropriate referrals.        Summary and Recommendations (to be completed by the evaluator) Brandon [name] is an 47 y.o. single white male. On or about 10/14/2012 he presented at [facility] by EMS, and was admitted to a medical floor following a suicide attempt by overdose. He had also attempted to cut his wrist. Pt has history of bipolar disorder and opioid/cocaine abuse. Recommendations for pt  include: crisis stabilization, therapeutic milieu, encourage group attendance and participation, librium/clonidine tapers for withdrawals, medication management for mood stabilization, and development of comprehensive mental wellness plan/sobrity plan.                         Signature:  Smart, [place], 10/17/2012 9:11 AM

## 2012-10-17 NOTE — Progress Notes (Signed)
D:  Patient stayed in bed most of the day.  Did not attend any groups this morning, but did get up for recreation time this afternoon.  Irritable/agitated at times, but easily redirectable.  Requesting quetiapine for bipolar.  Had a PRN dose of Librium this afternoon for agitation and withdrawal symptoms.  He did not complete a self inventory form today.  A:  Medications given as prescribed.  Held noon doses of Librium and Clonidine due to sedation and low blood pressure.  Offered support and encouragement.  De-escalated patient using calming voice and encouraging patient to focus on one issue at a time.  R:  Cooperative with staff.  Minimal interaction with peers.  Responds well when using a calm voice.  Tolerating medications at this time.

## 2012-10-17 NOTE — H&P (Signed)
Psychiatric Admission Assessment Adult  Patient Identification:  Brandon [name]  Date of Evaluation:  10/17/2012  Chief Complaint:  BIPOLAR  History of Present Illness: This is yet another admission assessment for this 47 year old Caucasian male. [name] has had several inpatient admissions in this hospital in the past. Admitted to [facility] from the [facility] with complaints of suicide attempt by overdose. Patient reports, "The ambulance took me to the [facility] on Monday this week. My dad called them because I was very depressed. I have been depressed x a couple of months. On Monday, my depression worsened, I could not take it any more, I attempted suicide by overdose. I took bunch of Geodon pills. I passed out by the time the ambulance got to me. I don't know or remembered what happened when the EMS got me to the hospital. But I know that I was there x 3 days. I have bipolar disorder, had it for years. I used drugs too, I just don't remember the last time or how much I had used. I used heroin, cocaine and some other pills. I was sober for almost 3 years a long time ago. Right now, I feel fatigued, run down".  Elements:  Location:  [facility] adult unit. Quality:  increased depression and anxiety. Severity:  Severe. Timing:  Severely depressed x a couple of months.. Duration:  Severely depressed x several years. Context:  "I feel depressed most of the time, I don't know why, I used drugs, don't remember when I used last".  Associated Signs/Synptoms:  Depression Symptoms:  depressed mood, hopelessness, suicidal thoughts with specific plan, suicidal attempt, loss of energy/fatigue,  (Hypo) Manic Symptoms:  Impulsivity, Labiality of Mood,  Anxiety Symptoms:  Excessive Worry,  Psychotic Symptoms:  Hallucinations: Denies  PTSD Symptoms: Had a traumatic exposure:  None reported  Psychiatric Specialty Exam: Physical Exam  Constitutional: He is oriented to person, place, and time.  He appears well-developed and well-nourished.  HENT:  Head: Normocephalic.  Eyes: Pupils are equal, round, and reactive to light.  Neck: Normal range of motion.  Cardiovascular: Normal rate.   Respiratory: Effort normal.  GI: Soft.  Musculoskeletal: Normal range of motion.  Neurological: He is alert and oriented to person, place, and time.  Skin: Skin is warm and dry.  Psychiatric: His speech is normal and behavior is normal. His mood appears anxious. Cognition and memory are normal. He expresses impulsivity. He exhibits a depressed mood. He expresses no homicidal and no suicidal ideation.    Review of Systems  Constitutional: Positive for chills, malaise/fatigue and diaphoresis.  HENT: Negative.   Eyes: Negative.   Respiratory: Negative.   Cardiovascular: Negative.   Gastrointestinal: Negative.   Genitourinary: Negative.   Musculoskeletal: Positive for myalgias.  Skin: Negative.   Neurological: Positive for dizziness, tremors and weakness.  Endo/Heme/Allergies: Negative.   Psychiatric/Behavioral: Positive for depression and substance abuse (Alcoholism, cocaine abuse, Benzododizepine abuse). Negative for suicidal ideas, hallucinations and memory loss. The patient is nervous/anxious and has insomnia.     Blood pressure 100/52, pulse 64, temperature 98 F (36.7 C), temperature source Oral, resp. rate 16, height 6\' 3"  (1.905 m), weight 105.688 kg (233 lb).Body mass index is 29.12 kg/(m^2).  General Appearance: Disheveled  Eye Contact::  Poor  Speech:  Clear and Coherent  Volume:  Normal  Mood:  Anxious and Depressed  Affect:  Restricted  Thought Process:  Coherent and Intact  Orientation:  Full (Time, Place, and Person)  Thought Content:  Hallucinations: denies and Rumination  Suicidal Thoughts:  No, "not any more" per patient's reports.  Homicidal Thoughts:  No  Memory:  Immediate;   Good Recent;   Good Remote;   Good  Judgement:  Impaired  Insight:  Lacking  Psychomotor  Activity:  Anxiousness  Concentration:  Fair  Recall:  Good  Akathisia:  No  Handed:  Right  AIMS (if indicated):     Assets:  Desire for Improvement  Sleep:  Number of Hours: 6.25   Past Psychiatric History:  Diagnosis: Bipolar affective disorder, depressed   Hospitalizations: [facility] x 3  Outpatient Care: Mental health clinic in [place]   Substance Abuse Care: [place] a long time ago   Self-Mutilation: Denies   Suicidal Attempts: "Yes, by overdose"   Violent Behaviors: Recently attempted suicide by overdose    Past Medical History:   Past Medical History  Diagnosis Date  . Drug abuse   . Kidney failure   . Bipolar 1 disorder   . Rhabdomyolysis   . Acute renal failure   . Opiate abuse, continuous   . Dental caries     extensive  . Anxiety   . Depression   . Bipolar affective disorder 03/17/2012  . Mood disorder 03/16/2012   None.  Allergies:   Allergies  Allergen Reactions  . Bean Pod Extract     [place], kidney and pork&beans   PTA Medications: Prescriptions prior to admission  Medication Sig Dispense Refill  . ziprasidone (GEODON) 60 MG capsule Take 60 mg by mouth at bedtime. Takes 2 pills      . acetaminophen (TYLENOL) 500 MG tablet Take 1,000 mg by mouth every 6 (six) hours as needed for pain.      . folic acid (FOLVITE) 1 MG tablet Take 1 tablet (1 mg total) by mouth daily.      . Multiple Vitamin (MULTIVITAMIN WITH MINERALS) TABS tablet Take 1 tablet by mouth daily.      . traZODone (DESYREL) 50 MG tablet Take 1 tablet (50 mg total) by mouth at bedtime as needed and may repeat dose one time if needed for sleep. For sleep  60 tablet  0    Previous Psychotropic Medications:  Medication/Dose  See medication lists               Substance Abuse History in the last 12 months:  yes  Consequences of Substance Abuse: Medical Consequences:  Liver damage, Possible death by overdose Legal Consequences:  Arrests, jail time, Loss of driving privilege. Family  Consequences:  Family discord, divorce and or separation.  Social History:  reports that he has never smoked. He has never used smokeless tobacco. He reports that he uses illicit drugs (Oxycodone, Amphetamines, Barbituates, Cocaine, and Methamphetamines). He reports that he does not drink alcohol. Additional Social History: Pain Medications: oxycodone, vicadin, tramadol, morphine  Prescriptions: most bought on the street per pt History of alcohol / drug use?: Yes Longest period of sobriety (when/how long): 2 yrs Negative Consequences of Use: Financial;Legal;Personal relationships;Work / Science writer Symptoms: Tremors;Agitation;Patient aware of relationship between substance abuse and physical/medical complications  Current Place of Residence: [place], [place]   Place of Birth: 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 Heart disease Mother   . Heart disease Father     Results for orders placed during the hospital encounter of 10/14/12 (from the past 72 hour(s))  URINE RAPID DRUG SCREEN (HOSP PERFORMED)     Status: Abnormal   Collection Time    10/14/12 11:40 AM      Result Value Range   Opiates NONE DETECTED  NONE DETECTED   Cocaine POSITIVE (*) NONE DETECTED   Benzodiazepines POSITIVE (*) NONE DETECTED   Amphetamines POSITIVE (*) NONE DETECTED   Tetrahydrocannabinol NONE DETECTED  NONE DETECTED   Barbiturates NONE DETECTED  NONE DETECTED   Comment:            DRUG SCREEN FOR MEDICAL PURPOSES      ONLY.  IF CONFIRMATION IS NEEDED     FOR ANY PURPOSE, NOTIFY LAB     WITHIN 5 DAYS.                LOWEST DETECTABLE LIMITS     FOR URINE DRUG SCREEN     Drug Class       Cutoff (ng/mL)     Amphetamine      1000     Barbiturate      200     Benzodiazepine   200     Tricyclics       300     Opiates          300     Cocaine          300     THC              50  ACETAMINOPHEN LEVEL     Status: None   Collection Time    10/14/12 11:46 AM      Result Value Range   Acetaminophen (Tylenol), Serum <15.0  10 - 30 ug/mL   Comment:            THERAPEUTIC CONCENTRATIONS VARY     SIGNIFICANTLY. A RANGE OF 10-30     ug/mL MAY BE AN EFFECTIVE     CONCENTRATION FOR MANY PATIENTS.     HOWEVER, SOME ARE BEST TREATED     AT CONCENTRATIONS OUTSIDE THIS     RANGE.     ACETAMINOPHEN CONCENTRATIONS     >150 ug/mL AT 4 HOURS AFTER     INGESTION AND >50 ug/mL AT 12     HOURS AFTER INGESTION ARE     OFTEN ASSOCIATED WITH TOXIC     REACTIONS.  COMPREHENSIVE METABOLIC PANEL     Status: Abnormal   Collection Time    10/14/12 11:46 AM      Result Value Range   Sodium 140  135 - 145 mEq/L   Potassium 3.9  3.5 - 5.1 mEq/L   Chloride 104  96 - 112 mEq/L   CO2 28  19 - 32 mEq/L   Glucose, Bld 108 (*) 70 - 99 mg/dL   BUN 16  6 - 23 mg/dL   Creatinine, Ser 1.61  0.50 - 1.35 mg/dL   Calcium 8.6  8.4 - 09.6 mg/dL   Total Protein 6.4  6.0 - 8.3 g/dL   Albumin 3.6  3.5 - 5.2 g/dL   AST 65 (*) 0 - 37 U/L   ALT 26  0 - 53 U/L   Alkaline Phosphatase 53  39 - 117 U/L   Total Bilirubin 0.5  0.3 - 1.2 mg/dL   GFR calc non Af Amer 82 (*) >90 mL/min   GFR calc Af Amer >90  >90 mL/min   Comment: (NOTE)     The eGFR has been calculated using the CKD EPI equation.     This calculation has not been validated in all clinical situations.     eGFR's persistently <90 mL/min signify possible Chronic Kidney     Disease.  ETHANOL     Status: None   Collection Time    10/14/12 11:46 AM      Result Value Range    Alcohol, Ethyl (B) <11  0 - 11 mg/dL   Comment:            LOWEST DETECTABLE LIMIT FOR     SERUM ALCOHOL IS 11 mg/dL     FOR MEDICAL PURPOSES ONLY  SALICYLATE LEVEL     Status: Abnormal   Collection Time    10/14/12 11:46 AM      Result Value Range   Salicylate Lvl <2.0 (*) 2.8 - 20.0 mg/dL  CBC WITH DIFFERENTIAL     Status: Abnormal   Collection Time    10/14/12 11:46 AM      Result Value Range   WBC 5.8  4.0 - 10.5 K/uL   RBC 3.94 (*) 4.22 - 5.81 MIL/uL   Hemoglobin 12.6 (*) 13.0 - 17.0 g/dL   HCT 16.1 (*) 09.6 - 04.5 %   MCV 92.1  78.0 - 100.0 fL   MCH 32.0  26.0 - 34.0 pg   MCHC 34.7  30.0 - 36.0 g/dL   RDW 40.9  81.1 - 91.4 %   Platelets 233  150 - 400 K/uL   Neutrophils Relative % 67  43 - 77 %   Neutro Abs 3.9  1.7 - 7.7 K/uL   Lymphocytes Relative 16  12 - 46 %   Lymphs Abs 0.9  0.7 - 4.0 K/uL   Monocytes Relative 16 (*) 3 - 12 %   Monocytes Absolute 0.9  0.1 - 1.0 K/uL   Eosinophils Relative 1  0 - 5 %   Eosinophils Absolute 0.1  0.0 - 0.7 K/uL   Basophils Relative 0  0 - 1 %   Basophils Absolute 0.0  0.0 - 0.1 K/uL  MRSA PCR SCREENING     Status: Abnormal   Collection Time    10/14/12  5:13 PM      Result Value Range   MRSA by PCR POSITIVE (*) NEGATIVE   Comment:            The GeneXpert MRSA Assay (FDA     approved for NASAL specimens     only), is one component of a     comprehensive MRSA colonization     surveillance program. It is not     intended to diagnose MRSA     infection nor to guide or     monitor treatment for     MRSA infections.     Performed at [facility]     Performed at [facility]  CBC     Status: Abnormal   Collection Time    10/15/12  5:05 AM      Result Value Range   WBC 5.0  4.0 - 10.5 K/uL   RBC 3.92 (*) 4.22 - 5.81 MIL/uL   Hemoglobin  12.2 (*) 13.0 - 17.0 g/dL   HCT 40.9 (*) 81.1 - 91.4 %   MCV 93.4  78.0 - 100.0 fL   MCH 31.1  26.0 - 34.0 pg   MCHC 33.3  30.0 - 36.0 g/dL   RDW 78.2  95.6 - 21.3 %    Platelets 240  150 - 400 K/uL  BASIC METABOLIC PANEL     Status: Abnormal   Collection Time    10/15/12  5:05 AM      Result Value Range   Sodium 139  135 - 145 mEq/L   Potassium 3.8  3.5 - 5.1 mEq/L   Chloride 103  96 - 112 mEq/L   CO2 27  19 - 32 mEq/L   Glucose, Bld 98  70 - 99 mg/dL   BUN 14  6 - 23 mg/dL   Creatinine, Ser 0.86  0.50 - 1.35 mg/dL   Calcium 8.7  8.4 - 57.8 mg/dL   GFR calc non Af Amer 73 (*) >90 mL/min   GFR calc Af Amer 85 (*) >90 mL/min   Comment: (NOTE)     The eGFR has been calculated using the CKD EPI equation.     This calculation has not been validated in all clinical situations.     eGFR's persistently <90 mL/min signify possible Chronic Kidney     Disease.   Psychological Evaluations:  Assessment:   DSM5:  Schizophrenia Disorders:  NA Obsessive-Compulsive Disorders:  NA Trauma-Stressor Disorders:  NA Substance/Addictive Disorders:  Opioid Disorder - Severe (304.00),  Depressive Disorders:  Bipolar affective disorder,  AXIS I:   Bipolar affective disorder, Opioid dependence, Cocaine abuse,  AXIS II:  Deferred AXIS III:   Past Medical History  Diagnosis Date  . Drug abuse   . Kidney failure   . Bipolar 1 disorder   . Rhabdomyolysis   . Acute renal failure   . Opiate abuse, continuous   . Dental caries     extensive  . Anxiety   . Depression   . Bipolar affective disorder 03/17/2012  . Mood disorder 03/16/2012   AXIS IV:  other psychosocial or environmental problems and Polysubstance abuse/dependence AXIS V:  11-20 some danger of hurting self or others possible OR occasionally fails to maintain minimal personal hygiene OR gross impairment in communication  Treatment Plan/Recommendations: 1. Admit for crisis management and stabilization, estimated length of stay 3-5 days.  2. Medication management to reduce current symptoms to base line and improve the patient's overall level of functioning; (a). Initiate Seroquel 200 mg Q hs for mood  control.  3. Treat health problems as indicated.  4. Develop treatment plan to decrease risk of relapse upon discharge and the need for readmission.  5. Psycho-social education regarding relapse prevention and self care.  6. Health care follow up as needed for medical problems.  7. Review, reconcile, and reinstate any pertinent home medications for other health issues where appropriate. 8. Call for consults with hospitalist for any additional specialty patient care services as needed.  Treatment Plan Summary: Daily contact with patient to assess and evaluate symptoms and progress in treatment Medication management Supportive approach/coping skills/relapse prevention Detox as needed, reassess co morbidities Current Medications:  Current Facility-Administered Medications  Medication Dose Route Frequency Provider Last Rate Last Dose  . acetaminophen (TYLENOL) tablet 650 mg  650 mg Oral Q6H PRN Sanjuana Kava, NP   650 mg at 10/16/12 2312  . alum & mag hydroxide-simeth (MAALOX/MYLANTA) 200-200-20 MG/5ML suspension 30 mL  30 mL Oral Q4H PRN Sanjuana Kava, NP      . chlordiazePOXIDE (LIBRIUM) capsule 25 mg  25 mg Oral Q6H PRN Sanjuana Kava, NP   25 mg at 10/16/12 2205  . chlordiazePOXIDE (LIBRIUM) capsule 25 mg  25 mg Oral QID Sanjuana Kava, NP   25 mg at 10/17/12 0843   Followed by  . [START ON 10/18/2012] chlordiazePOXIDE (LIBRIUM) capsule 25 mg  25 mg Oral TID Sanjuana Kava, NP       Followed by  . [START ON 10/19/2012] chlordiazePOXIDE (LIBRIUM) capsule 25 mg  25 mg Oral BH-qamhs Sanjuana Kava, NP       Followed by  . [START ON 10/20/2012] chlordiazePOXIDE (LIBRIUM) capsule 25 mg  25 mg Oral Daily Sanjuana Kava, NP      . chlordiazePOXIDE (LIBRIUM) capsule 50 mg  50 mg Oral Once Sanjuana Kava, NP      . cloNIDine (CATAPRES) tablet 0.1 mg  0.1 mg Oral QID Sanjuana Kava, NP   0.1 mg at 10/17/12 0842   Followed by  . [START ON 10/19/2012] cloNIDine (CATAPRES) tablet 0.1 mg  0.1 mg Oral BH-qamhs  Sanjuana Kava, NP       Followed by  . [START ON 10/21/2012] cloNIDine (CATAPRES) tablet 0.1 mg  0.1 mg Oral QAC breakfast Sanjuana Kava, NP      . dicyclomine (BENTYL) tablet 20 mg  20 mg Oral Q6H PRN Sanjuana Kava, NP      . loperamide (IMODIUM) capsule 2-4 mg  2-4 mg Oral PRN Sanjuana Kava, NP      . magnesium hydroxide (MILK OF MAGNESIA) suspension 30 mL  30 mL Oral Daily PRN Sanjuana Kava, NP      . methocarbamol (ROBAXIN) tablet 500 mg  500 mg Oral Q8H PRN Sanjuana Kava, NP      . multivitamin with minerals tablet 1 tablet  1 tablet Oral Daily Sanjuana Kava, NP   1 tablet at 10/17/12 0842  . naproxen (NAPROSYN) tablet 500 mg  500 mg Oral BID PRN Sanjuana Kava, NP   500 mg at 10/16/12 2209  . ondansetron (ZOFRAN-ODT) disintegrating tablet 4 mg  4 mg Oral Q6H PRN Sanjuana Kava, NP      . thiamine (B-1) injection 100 mg  100 mg Intramuscular Once Sanjuana Kava, NP      . thiamine (VITAMIN B-1) tablet 100 mg  100 mg Oral Daily Sanjuana Kava, NP   100 mg at 10/17/12 0842  . traZODone (DESYREL) tablet 50 mg  50 mg Oral QHS,MR X 1 Kerry Hough, PA-C   50 mg at 10/16/12 2312    Observation Level/Precautions:  15 minute checks  Laboratory:  Reviewed ED lab findings on file  Psychotherapy: Group sessions   Medications: See medication lists   Consultations:  As needed  Discharge Concerns:  Safety, stabilization and sobriety  Estimated LOS: 3-5 days worth  Other:     I certify that inpatient services furnished can reasonably be expected to improve the patient's condition.   Sanjuana Kava, PMHNP-BC, FNP 8/21/201410:48 AM Agree with assessment and plan Reymundo Poll. Dub Mikes, M.D.

## 2012-10-18 LAB — TESTOSTERONE, % FREE: Testosterone-% Free: 2.4 % — ABNORMAL HIGH (ref 1.6–2.9)

## 2012-10-18 LAB — TESTOSTERONE, FREE: Testosterone, Free: 30.8 pg/mL — ABNORMAL LOW (ref 47.0–244.0)

## 2012-10-18 NOTE — Progress Notes (Signed)
[facility] MD Progress Note  10/18/2012 3:44 PM [name]  MRN:  [id number] Subjective:  Brandon [name] feels sedated on the Seroquel but is willing to give it a try.  Diagnosis:   DSM5: Schizophrenia Disorders:   Obsessive-Compulsive Disorders:   Trauma-Stressor Disorders:   Substance/Addictive Disorders:  Opioid Disorder - Severe (304.00), Cocaine Abuse Depressive Disorders:  Bipolar Disorder  Axis I: Bipolar, Depressed  ADL's:  Intact  Sleep: Poor  Appetite:  Poor  Suicidal Ideation:  Plan:  denies Intent:  denies Means:  denies Homicidal Ideation:  Plan:  denies AEB (as evidenced by):  Psychiatric Specialty Exam: Review of Systems  Constitutional: Positive for malaise/fatigue.  HENT: Negative.   Eyes: Negative.   Respiratory: Negative.   Cardiovascular: Negative.   Gastrointestinal: Negative.   Genitourinary: Negative.   Musculoskeletal: Negative.   Skin: Negative.   Neurological: Positive for weakness.  Endo/Heme/Allergies: Negative.   Psychiatric/Behavioral: Positive for depression, suicidal ideas and substance abuse. The patient is nervous/anxious.     Blood pressure 102/57, pulse 70, temperature 97.7 F (36.5 C), temperature source Oral, resp. rate 22, height 6\' 3"  (1.905 m), weight 105.688 kg (233 lb).Body mass index is 29.12 kg/(m^2).  General Appearance: Disheveled  Eye Contact::  Minimal  Speech:  Clear and Coherent, Slow and Slurred  Volume:  Decreased  Mood:  Anxious, Depressed, Dysphoric, Hopeless, Irritable and Worthless  Affect:  Restricted  Thought Process:  Coherent and Goal Directed  Orientation:  Full (Time, Place, and Person)  Thought Content:  Rumination  Suicidal Thoughts:  Yes.  without intent/plan  Homicidal Thoughts:  No  Memory:  Immediate;   Fair Recent;   Fair Remote;   Fair  Judgement:  Fair  Insight:  Shallow  Psychomotor Activity:  Restlessness  Concentration:  Fair  Recall:  Fair  Akathisia:  No  Handed:  Right  AIMS (if  indicated):     Assets:  Desire for Improvement  Sleep:  Number of Hours: 5   Current Medications: Current Facility-Administered Medications  Medication Dose Route Frequency Provider Last Rate Last Dose  . acetaminophen (TYLENOL) tablet 650 mg  650 mg Oral Q6H PRN Sanjuana Kava, NP   650 mg at 10/16/12 2312  . alum & mag hydroxide-simeth (MAALOX/MYLANTA) 200-200-20 MG/5ML suspension 30 mL  30 mL Oral Q4H PRN Sanjuana Kava, NP      . chlordiazePOXIDE (LIBRIUM) capsule 25 mg  25 mg Oral Q6H PRN Sanjuana Kava, NP   25 mg at 10/17/12 1555  . chlordiazePOXIDE (LIBRIUM) capsule 25 mg  25 mg Oral TID Sanjuana Kava, NP   25 mg at 10/18/12 1213   Followed by  . [START ON 10/19/2012] chlordiazePOXIDE (LIBRIUM) capsule 25 mg  25 mg Oral BH-qamhs Sanjuana Kava, NP       Followed by  . [START ON 10/20/2012] chlordiazePOXIDE (LIBRIUM) capsule 25 mg  25 mg Oral Daily Sanjuana Kava, NP      . chlordiazePOXIDE (LIBRIUM) capsule 50 mg  50 mg Oral Once Sanjuana Kava, NP      . cloNIDine (CATAPRES) tablet 0.1 mg  0.1 mg Oral QID Sanjuana Kava, NP   0.1 mg at 10/18/12 1213   Followed by  . [START ON 10/19/2012] cloNIDine (CATAPRES) tablet 0.1 mg  0.1 mg Oral BH-qamhs Sanjuana Kava, NP       Followed by  . [START ON 10/21/2012] cloNIDine (CATAPRES) tablet 0.1 mg  0.1 mg Oral QAC breakfast Sanjuana Kava,  NP      . dicyclomine (BENTYL) tablet 20 mg  20 mg Oral Q6H PRN Sanjuana Kava, NP      . loperamide (IMODIUM) capsule 2-4 mg  2-4 mg Oral PRN Sanjuana Kava, NP      . magnesium hydroxide (MILK OF MAGNESIA) suspension 30 mL  30 mL Oral Daily PRN Sanjuana Kava, NP      . methocarbamol (ROBAXIN) tablet 500 mg  500 mg Oral Q8H PRN Sanjuana Kava, NP      . multivitamin with minerals tablet 1 tablet  1 tablet Oral Daily Sanjuana Kava, NP   1 tablet at 10/18/12 0836  . naproxen (NAPROSYN) tablet 500 mg  500 mg Oral BID PRN Sanjuana Kava, NP   500 mg at 10/16/12 2209  . ondansetron (ZOFRAN-ODT) disintegrating tablet 4 mg  4  mg Oral Q6H PRN Sanjuana Kava, NP      . QUEtiapine (SEROQUEL) tablet 100 mg  100 mg Oral BID Rachael Fee, MD   100 mg at 10/18/12 0836  . QUEtiapine (SEROQUEL) tablet 200 mg  200 mg Oral QHS Sanjuana Kava, NP   200 mg at 10/17/12 2206  . thiamine (B-1) injection 100 mg  100 mg Intramuscular Once Sanjuana Kava, NP      . thiamine (VITAMIN B-1) tablet 100 mg  100 mg Oral Daily Sanjuana Kava, NP   100 mg at 10/18/12 0835  . traZODone (DESYREL) tablet 50 mg  50 mg Oral QHS,MR X 1 Kerry Hough, PA-C   50 mg at 10/17/12 2234    Lab Results:  Results for orders placed during the hospital encounter of 10/16/12 (from the past 48 hour(s))  TESTOSTERONE     Status: Abnormal   Collection Time    10/17/12  7:53 PM      Result Value Range   Testosterone 128 (*) 300 - 890 ng/dL   Comment: (NOTE)              Tanner Stage       Male              Male                  I              < 30 ng/dL        < 10 ng/dL                  II             < 150 ng/dL       < 30 ng/dL                  III            100-320 ng/dL     < 35 ng/dL                  IV             200-970 ng/dL     16-10 ng/dL                  V/Adult        300-890 ng/dL     96-04 ng/dL     Performed at [facility]  TESTOSTERONE, FREE     Status: Abnormal   Collection Time    10/17/12  7:53 PM  Result Value Range   Testosterone, Free 30.8 (*) 47.0 - 244.0 pg/mL   Comment: (NOTE)     The concentration of free testosterone is derived from a mathematical     expression based on constants for the binding of testosterone to sex     hormone-binding globulin and albumin.     Performed at [facility]  TESTOSTERONE, % FREE     Status: Abnormal   Collection Time    10/17/12  7:53 PM      Result Value Range   Testosterone-% Freee. 2.4 (*) 1.6 - 2.9 %   Comment: Performed at [facility]  SEX HORMONE BINDING GLOBULIN     Status: None   Collection Time    10/17/12  7:53 PM      Result Value Range    Sex Hormone Binding 20  13 - 71 nmol/L   Comment: Performed at [facility]    Physical Findings: AIMS: Facial and Oral Movements Muscles of Facial Expression: Mild Lips and Perioral Area: Minimal Jaw: Moderate Tongue: None, normal,Extremity Movements Upper (arms, wrists, hands, fingers): Moderate Lower (legs, knees, ankles, toes): Mild, Trunk Movements Neck, shoulders, hips: Mild, Overall Severity Severity of abnormal movements (highest score from questions above): Moderate Incapacitation due to abnormal movements: Mild Patient's awareness of abnormal movements (rate only patient's report): Aware, mild distress, Dental Status Current problems with teeth and/or dentures?: Yes Does patient usually wear dentures?: No  CIWA:  CIWA-Ar Total: 3 COWS:  COWS Total Score: 2  Treatment Plan Summary: Daily contact with patient to assess and evaluate symptoms and progress in treatment Medication management  Plan: Supportive approach/coping skills/relapse prevention           Pursue detox           Pursue mood stabilization/optimize treatment with the Seroquel           Address the low testosterone as it pertains his mood disorder Medical Decision Making Problem Points:  Review of psycho-social stressors (1) Data Points:  Review of medication regiment & side effects (2)  I certify that inpatient services furnished can reasonably be expected to improve the patient's condition.   Mohmed Farver A 10/18/2012, 3:44 PM

## 2012-10-18 NOTE — Progress Notes (Signed)
Recreation Therapy Notes  Date: 08.21.2014 Time: 3:00pm Location: [address] Dayroom  Group Topic: Communication, Team Building, Problem Solving  Goal Area(s) Addresses:  Patient will be able to recognize use of communication, team building and problem solving during course of group activity. Patient will verbalize need for communication, team building and problem solving to make group activity successful.  Patient will verbalize benefit of communication, team building and problem solving to post d/c goals.   Behavioral Response: Did not attend.  Marykay Lex Rendell Thivierge, LRT/CTRS  Illene Sweeting L 10/18/2012 8:04 AM

## 2012-10-18 NOTE — [facility] Group Notes (Signed)
[facility] LCSW Aftercare Discharge Planning Group Note   10/18/2012 9:35 AM  Participation Quality:  DID NOT ATTEND  Smart, Herbert Seta

## 2012-10-18 NOTE — Tx Team (Signed)
Interdisciplinary Treatment Plan Update (Adult)  Date: 10/18/2012  Time Reviewed: 10:37 AM   Progress in Treatment:  Attending groups: No. Participating in groups:  No.  Taking medication as prescribed: Yes  Tolerating medication: Yes  Family/Significant othe contact made: Not yet. SPE required for pt.   Patient understands diagnosis: Yes, AEB seeking treatment for SI, depression, and substance abuse.  Discussing patient identified problems/goals with staff: Yes  Medical problems stabilized or resolved: Yes  Denies suicidal/homicidal ideation: Yes  Patient has not harmed self or Others: Yes  New problem(s) identified: n/a  Discharge Plan or Barriers: CSW assessing for appropriate referrals.  Additional comments: This is yet another admission assessment for this 47 year old Caucasian male. [place] has had several inpatient admissions in this hospital in the past. Admitted to [facility] from the [facility] with complaints of suicide attempt by overdose. Patient reports, "The ambulance took me to the [facility] on Monday this week. My dad called them because I was very depressed. I have been depressed x a couple of months. On Monday, my depression worsened, I could not take it any more, I attempted suicide by overdose. I took bunch of Geodon pills. I passed out by the time the ambulance got to me. I don't know or remembered what happened when the EMS got me to the hospital. But I know that I was there x 3 days. I have bipolar disorder, had it for years. I used drugs too, I just don't remember the last time or how much I had used. I used heroin, cocaine and some other pills. I was sober for almost 3 years a long time ago. Right now, I feel fatigued, run down". Reason for Continuation of Hospitalization: Librium/clonidine taper-withdrawals Medication management Mood stabilization Estimated length of stay: 3-4 days  For review of initial/current patient goals, please see plan of care.   Attendees:  Patient:    Family:    Physician: Geoffery Lyons MD 10/18/2012 10:39 AM   Nursing: Philippa Chester RN 10/18/2012 10:40 AM   Clinical Social Worker Izzabella Besse Smart, LCSWA  10/18/2012 10:40 AM   Other: Jan RN 10/18/2012 10:40 AM   Other: Aggie PA 10/18/2012 10:40 AM   Other:    Other:    Scribe for Treatment Team:  [employer] LCSWA 10/18/2012 10:40 AM

## 2012-10-18 NOTE — Progress Notes (Signed)
D: Patient denies SI/HI and A/V hallucinations;patient reports some anxiety and fatigue  A: Monitored q 15 minutes; patient encouraged to attend groups; patient educated about medications; patient given medications per physician orders; patient encouraged to express feelings and/or concerns  R: Patient has been in the bed most of the day; patient unwilling to participate in the groups; patient's interaction with staff and peers is minimal;  patient is taking medications as prescribed and tolerating medications; patient is not attending all groups

## 2012-10-18 NOTE — [facility] Group Notes (Signed)
[facility] LCSW Group Therapy  10/18/2012 1:50 PM  Type of Therapy:  Group Therapy  Participation Level:  Did Not Attend  Smart, Peja Allender 10/18/2012, 1:50 PM

## 2012-10-18 NOTE — Progress Notes (Signed)
Adult Psychoeducational Group Note  Date:  10/18/2012 Time:  10:05 PM  Group Topic/Focus: AA Group   Participation Level:  Did Not Attend   [name] 10/18/2012, 10:05 PM

## 2012-10-18 NOTE — Progress Notes (Signed)
Pt resting in bed with eyes closed. RR WNL, even and unlabored. No distress observed, complaints voiced. Level III obs in place. Pt remains safe. Lawrence Marseilles

## 2012-10-18 NOTE — Progress Notes (Signed)
Adult Psychoeducational Group Note  Date:  10/18/2012 Time:  1:32 PM  Group Topic/Focus:  Relapse Prevention Planning:   The focus of this group is to define relapse and discuss the need for planning to combat relapse.  Participation Level:  Did Not Attend  Additional Comments:  Pt did not attend this group. Pt remained in his bed for the duration of this group.  Reinaldo Raddle K 10/18/2012, 1:32 PM

## 2012-10-19 MED ORDER — DIPHENHYDRAMINE HCL 25 MG PO CAPS
50.0000 mg | ORAL_CAPSULE | Freq: Every evening | ORAL | Status: DC | PRN
  Administered 2012-10-20: 50 mg via ORAL

## 2012-10-19 NOTE — Progress Notes (Signed)
D.  Pt anxious on approach, states that he had requested Seroquel to be discontinued because it was making him a zombie.  Pt was upset that nothing was ordered to replace Seroquel though, Pt states "well I just won't be able to sleep then"  Pt states that he will need something to replace Seroquel for "my Bipolar".  Pt to speak with doctor tomorrow.  A.  Doctor on call notified of Pt's concern and Benadryl ordered for sleep in ordered Trazodone not effective.  Support and encouragement offered  R.  Pt remains safe on unit, states he will discuss his medication with doctor tomorrow.

## 2012-10-19 NOTE — Progress Notes (Signed)
[facility] MD Progress Note  10/19/2012 4:21 PM Brandon [name]  MRN:  [id number] Subjective:  Brandon [name] is having a hard time. He has been in bed feeling weak, with malaise. He has has not been able to get up to go to the cafeteria. He feels the medications might be affecting him. Feels "run down" depressed. His Testosterone level is very low. Will start replacing. Still with suicidal ideations still with mood instability, " I am a mess Doc, I need help some serious help." Diagnosis:   DSM5: Schizophrenia Disorders:   Obsessive-Compulsive Disorders:   Trauma-Stressor Disorders:   Substance/Addictive Disorders:  Opioid Disorder - Moderate (304.00), Cocaine related disorders Depressive Disorders:    Axis I: Bipolar, Depressed  ADL's:  Intact  Sleep: Poor  Appetite:  Poor  Suicidal Ideation:  Plan:  denies Intent:  denies Means:  denies Homicidal Ideation:  Plan:  denies Intent:  denies Means:  denies AEB (as evidenced by):  Psychiatric Specialty Exam: Review of Systems  Constitutional: Positive for malaise/fatigue.  HENT: Negative.   Eyes: Negative.   Respiratory: Negative.   Cardiovascular: Negative.   Gastrointestinal: Positive for nausea.  Genitourinary: Negative.   Musculoskeletal: Positive for myalgias.  Skin: Negative.   Neurological: Positive for dizziness and weakness.  Endo/Heme/Allergies: Negative.   Psychiatric/Behavioral: Positive for depression, suicidal ideas and substance abuse. The patient is nervous/anxious and has insomnia.     Blood pressure 101/65, pulse 53, temperature 98.4 F (36.9 C), temperature source Oral, resp. rate 20, height 6\' 3"  (1.905 m), weight 105.688 kg (233 lb).Body mass index is 29.12 kg/(m^2).  General Appearance: Disheveled  Eye Contact::  Minimal  Speech:  Slow and not spontaneous  Volume:  Decreased  Mood:  Anxious, Depressed, Dysphoric and Hopeless  Affect:  anxious, sad, worried  Thought Process:  Coherent and Goal Directed   Orientation:  Full (Time, Place, and Person)  Thought Content:  worries, concerns, somatically focused  Suicidal Thoughts:  Yes.  without intent/plan  Homicidal Thoughts:  No  Memory:  Immediate;   Fair Recent;   Fair Remote;   Fair  Judgement:  Fair  Insight:  Present  Psychomotor Activity:  Restlessness  Concentration:  Fair  Recall:  Fair  Akathisia:  No  Handed:  Right  AIMS (if indicated):     Assets:  Desire for Improvement  Sleep:  Number of Hours: 6.75   Current Medications: Current Facility-Administered Medications  Medication Dose Route Frequency Provider Last Rate Last Dose  . acetaminophen (TYLENOL) tablet 650 mg  650 mg Oral Q6H PRN Sanjuana Kava, NP   650 mg at 10/16/12 2312  . alum & mag hydroxide-simeth (MAALOX/MYLANTA) 200-200-20 MG/5ML suspension 30 mL  30 mL Oral Q4H PRN Sanjuana Kava, NP      . chlordiazePOXIDE (LIBRIUM) capsule 25 mg  25 mg Oral BH-qamhs Sanjuana Kava, NP   25 mg at 10/19/12 0925   Followed by  . [START ON 10/20/2012] chlordiazePOXIDE (LIBRIUM) capsule 25 mg  25 mg Oral Daily Sanjuana Kava, NP      . chlordiazePOXIDE (LIBRIUM) capsule 50 mg  50 mg Oral Once Sanjuana Kava, NP      . dicyclomine (BENTYL) tablet 20 mg  20 mg Oral Q6H PRN Sanjuana Kava, NP      . magnesium hydroxide (MILK OF MAGNESIA) suspension 30 mL  30 mL Oral Daily PRN Sanjuana Kava, NP      . methocarbamol (ROBAXIN) tablet 500 mg  500 mg  Oral Q8H PRN Sanjuana Kava, NP   500 mg at 10/19/12 1610  . multivitamin with minerals tablet 1 tablet  1 tablet Oral Daily Sanjuana Kava, NP   1 tablet at 10/19/12 9604  . naproxen (NAPROSYN) tablet 500 mg  500 mg Oral BID PRN Sanjuana Kava, NP   500 mg at 10/19/12 0925  . thiamine (B-1) injection 100 mg  100 mg Intramuscular Once Sanjuana Kava, NP      . thiamine (VITAMIN B-1) tablet 100 mg  100 mg Oral Daily Sanjuana Kava, NP   100 mg at 10/19/12 0924  . traZODone (DESYREL) tablet 50 mg  50 mg Oral QHS,MR X 1 Kerry Hough, PA-C   50 mg  at 10/18/12 2133    Lab Results:  Results for orders placed during the hospital encounter of 10/16/12 (from the past 48 hour(s))  TESTOSTERONE     Status: Abnormal   Collection Time    10/17/12  7:53 PM      Result Value Range   Testosterone 128 (*) 300 - 890 ng/dL   Comment: (NOTE)              Tanner Stage       Male              Male                  I              < 30 ng/dL        < 10 ng/dL                  II             < 150 ng/dL       < 30 ng/dL                  III            100-320 ng/dL     < 35 ng/dL                  IV             200-970 ng/dL     54-09 ng/dL                  V/Adult        300-890 ng/dL     81-19 ng/dL     Performed at [facility]  TESTOSTERONE, FREE     Status: Abnormal   Collection Time    10/17/12  7:53 PM      Result Value Range   Testosterone, Free 30.8 (*) 47.0 - 244.0 pg/mL   Comment: (NOTE)     The concentration of free testosterone is derived from a mathematical     expression based on constants for the binding of testosterone to sex     hormone-binding globulin and albumin.     Performed at [facility]  TESTOSTERONE, % FREE     Status: Abnormal   Collection Time    10/17/12  7:53 PM      Result Value Range   Testosterone-% Freee. 2.4 (*) 1.6 - 2.9 %   Comment: Performed at [facility]  SEX HORMONE BINDING GLOBULIN     Status: None   Collection Time    10/17/12  7:53 PM      Result Value Range  Sex Hormone Binding 20  13 - 71 nmol/L   Comment: Performed at [facility]    Physical Findings: AIMS: Facial and Oral Movements Muscles of Facial Expression: Mild Lips and Perioral Area: Minimal Jaw: Moderate Tongue: None, normal,Extremity Movements Upper (arms, wrists, hands, fingers): Moderate Lower (legs, knees, ankles, toes): Mild, Trunk Movements Neck, shoulders, hips: Mild, Overall Severity Severity of abnormal movements (highest score from questions above): Moderate Incapacitation due  to abnormal movements: Mild Patient's awareness of abnormal movements (rate only patient's report): Aware, mild distress, Dental Status Current problems with teeth and/or dentures?: Yes Does patient usually wear dentures?: No  CIWA:  CIWA-Ar Total: 1 COWS:  COWS Total Score: 2  Treatment Plan Summary: Daily contact with patient to assess and evaluate symptoms and progress in treatment Medication management  Plan: Supportive approach/coping skills/relapse prevention           Will D/C the Seroquel as well as the clonidine and reassess           Will start replacing testosterone  Medical Decision Making Problem Points:  Established problem, worsening (2) and Review of psycho-social stressors (1) Data Points:  Review of medication regiment & side effects (2)  I certify that inpatient services furnished can reasonably be expected to improve the patient's condition.   Jaylaa Gallion A 10/19/2012, 4:21 PM

## 2012-10-19 NOTE — Progress Notes (Signed)
Adult Psychoeducational Group Note  Date:  10/19/2012 Time:  0900  Group Topic/Focus:  Self Inventory  Participation Level:  Did Not Attend  Pt decided not to attend group  Brandon [name] Brandon [name] 10/19/2012, 10:46 AM

## 2012-10-19 NOTE — Progress Notes (Signed)
Patient did not attend the evening speaker AA meeting. Pt remained in bed during group.   

## 2012-10-19 NOTE — Progress Notes (Signed)
D   Pt complained of his back being stiff this morning and he was not able to come get his medications   He is depressed and anxious  But denies suicidal and homicidal ideation   He has isolated to his room and did not attend group   A   Verbal support given   Medications administered and effectiveness monitored  Encouraged group attendance and participation   Q 15 min checks R   Pt safe at present

## 2012-10-19 NOTE — [facility] Group Notes (Signed)
[facility] Group Notes: (Clinical Social Work)   10/19/2012      Type of Therapy:  Group Therapy   Participation Level:  Did Not Attend    Ambrose Mantle, LCSW 10/19/2012, 11:14 AM

## 2012-10-19 NOTE — [facility] Group Notes (Signed)
[facility] Group Notes:  (Nursing/MHT/Case Management/Adjunct)  Date:  10/19/2012  Time:  2:37 PM  Type of Therapy:  Nurse Education  Participation Level:  Did Not Attend  Participation Quality:    Affect:    Cognitive:    Insight:    Engagement in Group:    Modes of Intervention:    Summary of Progress/Problems:  Brandon [name] 10/19/2012, 2:37 PM

## 2012-10-20 NOTE — Progress Notes (Signed)
Adult Psychoeducational Group Note  Date:  10/20/2012 Time:  0900  Group Topic/Focus:  Self Inventory  Did not attend   Tom Macpherson Shari Prows 10/20/2012, 10:11 AM

## 2012-10-20 NOTE — Progress Notes (Signed)
D   Pt expressed again that he was not happy with the doctors decision to discontinue the seroquel   He said I need something for my bipolar   He remains isolative and staying in bed most of the morning   He has some mild tremors he  is cooperative but required medications be brought to his room as he said his back hurts and he doesn't want to get out of bed A   Verbal support given   Medications administered and effectiveness monitored   Q 15 min checks   Continue to encourage participation in unit activities R  Pt safe at present

## 2012-10-20 NOTE — Progress Notes (Signed)
Patient did attend the evening speaker AA meeting.  

## 2012-10-20 NOTE — Progress Notes (Signed)
D.  Pt states that he is still suicidal if discharged and reports disappointment that he did not succeed this time.  Pt states "that is where I made the mistake, I should have waited 30 more minutes before I called my Dad".  Pt upset with ex-girlfriend and his life as it is.  Pt states that last time he attempted suicide his ex-girlfriend came up here crying and begged him not to try it again.  This time he states that she told him he is "full of shit".   Pt does contract for safety while on the unit.  Pt did attend evening AA group.  Interacting appropriately with peers on unit.  Denies HI/hallucinations at this time.   A.  Support and encouragement offered  Encouraged Pt to attend groups, and to concentrate on getting well rather than behavior of ex-girlfriend.  R.  Pt remains safe on unit, will continue to monitor.

## 2012-10-20 NOTE — Progress Notes (Signed)
Psychoeducational Group Note  Date:  10/20/2012 Time:  0945 am  Group Topic/Focus:  Making Healthy Choices:   The focus of this group is to help patients identify negative/unhealthy choices they were using prior to admission and identify positive/healthier coping strategies to replace them upon discharge.  Participation Level:  Did Not Attend    Nayelly Laughman J 10/20/2012, 10:29 AM 

## 2012-10-20 NOTE — Progress Notes (Signed)
[facility] MD Progress Note  10/20/2012 8:15 PM Brandon [name]  MRN:  [id number] Subjective:  Brandon [name] is still having a hard time. He states he has not been able to get himself together. Still feels weak, has not been able to get out of bed. Still feeing very depressed, anxious, worried still with suicidal ideas.Seems the Seroquel/clonodine affected him adversely. Will continue to hold any psychotropics Diagnosis:   DSM5: Schizophrenia Disorders:   Obsessive-Compulsive Disorders:   Trauma-Stressor Disorders:   Substance/Addictive Disorders:  Opioid Disorder - Severe (304.00), Cocaine Abuse Depressive Disorders:    Axis I: Bipolar, Depressed  ADL's:  Impaired  Sleep: Poor  Appetite:  Poor  Suicidal Ideation:  Plan:  denies Intent:  denies Means:  denies Homicidal Ideation:  Plan:  denies Intent:  denies Means:  denies AEB (as evidenced by):  Psychiatric Specialty Exam: Review of Systems  Constitutional: Positive for malaise/fatigue.  HENT: Negative.   Eyes: Negative.   Respiratory: Negative.   Cardiovascular: Negative.   Gastrointestinal: Negative.   Genitourinary: Negative.   Musculoskeletal: Negative.   Skin: Negative.   Neurological: Positive for weakness.  Endo/Heme/Allergies: Negative.   Psychiatric/Behavioral: Positive for depression, suicidal ideas and substance abuse. The patient is nervous/anxious and has insomnia.     Blood pressure 109/67, pulse 76, temperature 98.3 F (36.8 C), temperature source Oral, resp. rate 17, height 6\' 3"  (1.905 m), weight 105.688 kg (233 lb).Body mass index is 29.12 kg/(m^2).  General Appearance: Disheveled  Eye Contact::  Minimal  Speech:  Clear and Coherent, Slow and not spontaneous  Volume:  Decreased  Mood:  Anxious, Depressed and Hopeless  Affect:  Restricted and worried  Thought Process:  Coherent and Goal Directed  Orientation:  Full (Time, Place, and Person)  Thought Content:  worries, concerns, physical symptoms  Suicidal  Thoughts:  Yes.  without intent/plan  Homicidal Thoughts:  No  Memory:  Immediate;   Fair Recent;   Fair Remote;   Fair  Judgement:  Fair  Insight:  Present and Shallow  Psychomotor Activity:  Restlessness  Concentration:  Fair  Recall:  Fair  Akathisia:  No  Handed:  Right  AIMS (if indicated):     Assets:  Desire for Improvement  Sleep:  Number of Hours: 2.5   Current Medications: Current Facility-Administered Medications  Medication Dose Route Frequency Provider Last Rate Last Dose  . acetaminophen (TYLENOL) tablet 650 mg  650 mg Oral Q6H PRN Sanjuana Kava, NP   650 mg at 10/16/12 2312  . alum & mag hydroxide-simeth (MAALOX/MYLANTA) 200-200-20 MG/5ML suspension 30 mL  30 mL Oral Q4H PRN Sanjuana Kava, NP      . chlordiazePOXIDE (LIBRIUM) capsule 50 mg  50 mg Oral Once Sanjuana Kava, NP      . dicyclomine (BENTYL) tablet 20 mg  20 mg Oral Q6H PRN Sanjuana Kava, NP      . diphenhydrAMINE (BENADRYL) capsule 50 mg  50 mg Oral QHS PRN Verne Spurr, PA-C      . magnesium hydroxide (MILK OF MAGNESIA) suspension 30 mL  30 mL Oral Daily PRN Sanjuana Kava, NP      . methocarbamol (ROBAXIN) tablet 500 mg  500 mg Oral Q8H PRN Sanjuana Kava, NP   500 mg at 10/20/12 0834  . multivitamin with minerals tablet 1 tablet  1 tablet Oral Daily Sanjuana Kava, NP   1 tablet at 10/20/12 0865  . naproxen (NAPROSYN) tablet 500 mg  500 mg Oral BID  PRN Sanjuana Kava, NP   500 mg at 10/20/12 0834  . thiamine (B-1) injection 100 mg  100 mg Intramuscular Once Sanjuana Kava, NP      . thiamine (VITAMIN B-1) tablet 100 mg  100 mg Oral Daily Sanjuana Kava, NP   100 mg at 10/20/12 1610  . traZODone (DESYREL) tablet 50 mg  50 mg Oral QHS,MR X 1 Kerry Hough, PA-C   50 mg at 10/19/12 2308    Lab Results: No results found for this or any previous visit (from the past 48 hour(s)).  Physical Findings: AIMS: Facial and Oral Movements Muscles of Facial Expression: Mild Lips and Perioral Area: Minimal Jaw:  Moderate Tongue: None, normal,Extremity Movements Upper (arms, wrists, hands, fingers): Moderate Lower (legs, knees, ankles, toes): Mild, Trunk Movements Neck, shoulders, hips: Mild, Overall Severity Severity of abnormal movements (highest score from questions above): Moderate Incapacitation due to abnormal movements: Mild Patient's awareness of abnormal movements (rate only patient's report): Aware, mild distress, Dental Status Current problems with teeth and/or dentures?: Yes Does patient usually wear dentures?: No  CIWA:  CIWA-Ar Total: 1 COWS:  COWS Total Score: 2  Treatment Plan Summary: Daily contact with patient to assess and evaluate symptoms and progress in treatment Medication management  Plan: Supportive approach/coping skills/relapse prevention            Continue to hold psychotropics            Reassess for another mood stabilizer  Medical Decision Making Problem Points:  Review of psycho-social stressors (1) Data Points:  Review of medication regiment & side effects (2)  I certify that inpatient services furnished can reasonably be expected to improve the patient's condition.   Melitza Metheny A 10/20/2012, 8:15 PM

## 2012-10-20 NOTE — [facility] Group Notes (Signed)
[facility] Group Notes: (Clinical Social Work)   10/20/2012      Type of Therapy:  Group Therapy   Participation Level:  Did Not Attend    Ambrose Mantle, LCSW 10/20/2012, 1:07 PM

## 2012-10-21 DIAGNOSIS — F3130 Bipolar disorder, current episode depressed, mild or moderate severity, unspecified: Principal | ICD-10-CM

## 2012-10-21 MED ORDER — ZIPRASIDONE HCL 60 MG PO CAPS
60.0000 mg | ORAL_CAPSULE | Freq: Two times a day (BID) | ORAL | Status: DC
  Administered 2012-10-21 – 2012-10-22 (×3): 60 mg via ORAL
  Filled 2012-10-21 (×8): qty 1

## 2012-10-21 MED ORDER — TESTOSTERONE 50 MG/5GM (1%) TD GEL
5.0000 g | Freq: Every day | TRANSDERMAL | Status: DC
  Administered 2012-10-21 – 2012-10-22 (×2): 5 g via TRANSDERMAL
  Filled 2012-10-21 (×2): qty 5

## 2012-10-21 NOTE — Progress Notes (Signed)
The focus of this group is to help patients review their daily goal of treatment and discuss progress on daily workbooks.  Brandon [name] attended A/A tonight.

## 2012-10-21 NOTE — [facility] Group Notes (Signed)
[facility] LCSW Group Therapy  10/21/2012 2:24 PM  Type of Therapy:  Group Therapy  Participation Level:  Did Not Attend  Smart, Herbert Seta 10/21/2012, 2:24 PM

## 2012-10-21 NOTE — Progress Notes (Addendum)
Patient ID: Brandon [name], male   DOB: 19-Sep-1965, 47 y.o.   MRN: [id number]  D: Pt denies SI/HI/AVH. Pt say he was tired all day.  A: Pt was offered support and encouragement. Pt was given scheduled medications. Pt was encourage to attend groups. Q 15 minute checks were done for safety.   R:Pt attends groups and interacts well with peers and staff. Pt is taking medication. Pt has no complaints at this time.Pt receptive to treatment and safety maintained on unit.

## 2012-10-21 NOTE — Tx Team (Signed)
Interdisciplinary Treatment Plan Update (Adult)  Date: 10/21/2012  Time Reviewed: 9:49 AM   Progress in Treatment:  Attending groups: No. Participating in groups:  No.  Taking medication as prescribed: Yes  Tolerating medication: Yes  Family/Significant othe contact made: Not yet. SPE required for pt.   Patient understands diagnosis: Yes, AEB seeking treatment for SI, depression, and substance abuse.  Discussing patient identified problems/goals with staff: Yes  Medical problems stabilized or resolved: Yes  Denies suicidal/homicidal ideation: Yes  Patient has not harmed self or Others: Yes  New problem(s) identified: Pt refusing to come to morning groups and claims that he feels poorly. He is currently unwilling to participate in aftercare plan and asked CSW to visit with him later or tomorrow when he is feeling better.  Discharge Plan or Barriers: CSW assessing for appropriate referrals.  Additional comments:  Reason for Continuation of Hospitalization: Mood stabilization Medication management  Estimated length of stay: 1-2 days  For review of initial/current patient goals, please see plan of care.  Attendees:  Patient:    Family:    Physician:   Nursing: Patsy Lager RN  10/21/2012 9:49 AM   Clinical Social Worker [employer], LCSWA  10/21/2012 9:49 AM   Other: Lupita Leash RN  10/21/2012 9:49 AM   Other: Aggie PA 10/21/2012 9:49 AM   Other: Darden Dates Nurse CM 10/21/2012 9:50 AM   Other:    Scribe for Treatment Team:  Trula Slade LCSWA 10/21/2012 9:49 AM

## 2012-10-21 NOTE — [facility] Group Notes (Signed)
[facility] LCSW Aftercare Discharge Planning Group Note   10/21/2012 9:25 AM  Participation Quality:  DID NOT ATTEND   Brandon [name], Brandon [name]   

## 2012-10-21 NOTE — Progress Notes (Signed)
Patient ID: Brandon [name], male   DOB: September 28, 1965, 47 y.o.   MRN: [id number] [facility] MD Progress Note  10/21/2012 12:13 PM Brandon [name]  MRN:  [id number]  Subjective:  Brandon [name] is still having a hard time. He reports, "I feel like crap. I feel so bad that I feel like getting high again just to feel better. I feel like I'm at the bottom of the barrel right now. I need to get back on Geodon, when I was on it I was feeling better. I thought that it was not working, that was the reason I stopped. Now I know it was working. I'm feeling much worse now that when I came in. The doctor checked my testosterone levels the other day, he told me that it was very low. He was suppose to start me on androgen, but he did not. Somebody better do something"  Diagnosis:   DSM5: Schizophrenia Disorders:   Obsessive-Compulsive Disorders:   Trauma-Stressor Disorders:   Substance/Addictive Disorders:  Opioid Disorder - Severe (304.00), Cocaine Abuse Depressive Disorders:    Axis I: Bipolar, Depressed  ADL's:  Impaired  Sleep: Poor  Appetite:  Poor  Suicidal Ideation:  Plan:  denies Intent:  denies Means:  denies Homicidal Ideation:  Plan:  denies Intent:  denies Means:  denies AEB (as evidenced by):  Psychiatric Specialty Exam: Review of Systems  Constitutional: Positive for malaise/fatigue.  HENT: Negative.   Eyes: Negative.   Respiratory: Negative.   Cardiovascular: Negative.   Gastrointestinal: Negative.   Genitourinary: Negative.   Musculoskeletal: Negative.   Skin: Negative.   Neurological: Positive for weakness.  Endo/Heme/Allergies: Negative.   Psychiatric/Behavioral: Positive for depression, suicidal ideas and substance abuse. The patient is nervous/anxious and has insomnia.     Blood pressure 93/55, pulse 66, temperature 98.1 F (36.7 C), temperature source Oral, resp. rate 18, height 6\' 3"  (1.905 m), weight 105.688 kg (233 lb).Body mass index is 29.12 kg/(m^2).  General Appearance:  Disheveled  Eye Contact::  Minimal  Speech:  Clear and Coherent, Slow and not spontaneous  Volume:  Decreased  Mood:  Anxious, Depressed and Hopeless, agitated, restless  Affect:  Restricted and worried  Thought Process:  Coherent and Goal Directed  Orientation:  Full (Time, Place, and Person)  Thought Content:  worries, concerns, physical symptoms  Suicidal Thoughts:  Yes.  without intent/plan  Homicidal Thoughts:  No  Memory:  Immediate;   Fair Recent;   Fair Remote;   Fair  Judgement:  Fair  Insight:  Present and Shallow  Psychomotor Activity:  Restlessness  Concentration:  Fair  Recall:  Fair  Akathisia:  No  Handed:  Right  AIMS (if indicated):     Assets:  Desire for Improvement  Sleep:  Number of Hours: 3   Current Medications: Current Facility-Administered Medications  Medication Dose Route Frequency Provider Last Rate Last Dose  . acetaminophen (TYLENOL) tablet 650 mg  650 mg Oral Q6H PRN Sanjuana Kava, NP   650 mg at 10/16/12 2312  . alum & mag hydroxide-simeth (MAALOX/MYLANTA) 200-200-20 MG/5ML suspension 30 mL  30 mL Oral Q4H PRN Sanjuana Kava, NP      . chlordiazePOXIDE (LIBRIUM) capsule 50 mg  50 mg Oral Once Sanjuana Kava, NP      . dicyclomine (BENTYL) tablet 20 mg  20 mg Oral Q6H PRN Sanjuana Kava, NP      . diphenhydrAMINE (BENADRYL) capsule 50 mg  50 mg Oral QHS PRN Verne Spurr, PA-C  50 mg at 10/20/12 2135  . magnesium hydroxide (MILK OF MAGNESIA) suspension 30 mL  30 mL Oral Daily PRN Sanjuana Kava, NP      . methocarbamol (ROBAXIN) tablet 500 mg  500 mg Oral Q8H PRN Sanjuana Kava, NP   500 mg at 10/20/12 2135  . multivitamin with minerals tablet 1 tablet  1 tablet Oral Daily Sanjuana Kava, NP   1 tablet at 10/21/12 0845  . naproxen (NAPROSYN) tablet 500 mg  500 mg Oral BID PRN Sanjuana Kava, NP   500 mg at 10/20/12 0834  . testosterone (ANDROGEL) gel 5 g  5 g Transdermal Daily Sanjuana Kava, NP      . thiamine (B-1) injection 100 mg  100 mg Intramuscular  Once Sanjuana Kava, NP      . thiamine (VITAMIN B-1) tablet 100 mg  100 mg Oral Daily Sanjuana Kava, NP   100 mg at 10/21/12 0845  . traZODone (DESYREL) tablet 50 mg  50 mg Oral QHS,MR X 1 Kerry Hough, PA-C   50 mg at 10/20/12 2332  . ziprasidone (GEODON) capsule 60 mg  60 mg Oral BID WC Sanjuana Kava, NP        Lab Results: No results found for this or any previous visit (from the past 48 hour(s)).  Physical Findings: AIMS: Facial and Oral Movements Muscles of Facial Expression: Mild Lips and Perioral Area: Minimal Jaw: Moderate Tongue: None, normal,Extremity Movements Upper (arms, wrists, hands, fingers): Moderate Lower (legs, knees, ankles, toes): Mild, Trunk Movements Neck, shoulders, hips: Mild, Overall Severity Severity of abnormal movements (highest score from questions above): Moderate Incapacitation due to abnormal movements: Mild Patient's awareness of abnormal movements (rate only patient's report): Aware, mild distress, Dental Status Current problems with teeth and/or dentures?: Yes Does patient usually wear dentures?: No  CIWA:  CIWA-Ar Total: 2 COWS:  COWS Total Score: 2  Treatment Plan Summary: Daily contact with patient to assess and evaluate symptoms and progress in treatment Medication management  Plan: Supportive approach/coping skills/relapse prevention Initiated Geodon 60 mg daily for mood control with room to increase. Androgel 5 gm daily for low testosterone. Continue current treatment plan.  Medical Decision Making Problem Points:  Review of psycho-social stressors (1) Data Points:  Review of medication regiment & side effects (2)  I certify that inpatient services furnished can reasonably be expected to improve the patient's condition.   Armandina Stammer I, PMHNP-BC 10/21/2012, 12:13 PM

## 2012-10-21 NOTE — Progress Notes (Signed)
D: Pt denies SI/HI/AVH. Pt forwards little information. Pt reports poor sleep last night. c/o chills and sweats this morning. Pt unwilling to participate in groups this morning. A: Medications administered as ordered per MD. Verbal support given. Pt encouraged to attend groups. R: Pt remains safe at this time. Pt slept all morning and continues to sleep.

## 2012-10-22 MED ORDER — ZIPRASIDONE HCL 60 MG PO CAPS
60.0000 mg | ORAL_CAPSULE | Freq: Once | ORAL | Status: AC
  Administered 2012-10-22: 60 mg via ORAL
  Filled 2012-10-22: qty 1

## 2012-10-22 MED ORDER — TESTOSTERONE 50 MG/5GM (1%) TD GEL
7.5000 g | Freq: Every day | TRANSDERMAL | Status: DC
  Administered 2012-10-23 – 2012-10-24 (×2): 7.5 g via TRANSDERMAL
  Filled 2012-10-22: qty 10
  Filled 2012-10-22 (×2): qty 5

## 2012-10-22 MED ORDER — TRAZODONE HCL 100 MG PO TABS
100.0000 mg | ORAL_TABLET | Freq: Every evening | ORAL | Status: DC | PRN
  Administered 2012-10-22 – 2012-10-23 (×4): 100 mg via ORAL
  Filled 2012-10-22: qty 1
  Filled 2012-10-22: qty 28
  Filled 2012-10-22 (×6): qty 1
  Filled 2012-10-22: qty 28

## 2012-10-22 MED ORDER — GABAPENTIN 100 MG PO CAPS
100.0000 mg | ORAL_CAPSULE | Freq: Three times a day (TID) | ORAL | Status: DC
  Administered 2012-10-22 – 2012-10-24 (×6): 100 mg via ORAL
  Filled 2012-10-22: qty 42
  Filled 2012-10-22 (×3): qty 1
  Filled 2012-10-22: qty 42
  Filled 2012-10-22: qty 1
  Filled 2012-10-22: qty 42
  Filled 2012-10-22 (×7): qty 1

## 2012-10-22 MED ORDER — LURASIDONE HCL 40 MG PO TABS
40.0000 mg | ORAL_TABLET | Freq: Every day | ORAL | Status: DC
  Administered 2012-10-23 – 2012-10-24 (×2): 40 mg via ORAL
  Filled 2012-10-22: qty 1
  Filled 2012-10-22: qty 14
  Filled 2012-10-22 (×4): qty 1

## 2012-10-22 NOTE — Progress Notes (Signed)
[facility] MD Progress Note  10/22/2012 4:52 PM Brandon [name]  MRN:  [id number] Subjective:  Had to take the Geodon again. He did not tolerate the Seroquel. Off the Seroquel he is back at his baseline. Afraid that the Geodon is not going to work once he is out of here. He still feels the depression, mood instability. Afraid that these symptoms trigger his use. He also describes long term history of inattentiveness, easy distractibility, hyperactivity. He has taken stimulants out of the street and has found them effective. He admit the impulsivity has a lot to do with the trouble he gets in. Would like to try a substitute for the Geodon as does not want to go trough this again. Diagnosis:   DSM5: Schizophrenia Disorders:   Obsessive-Compulsive Disorders:   Trauma-Stressor Disorders:   Substance/Addictive Disorders:  Opioid Disorder - Severe (304.00), Cocaine Abuse Depressive Disorders:    Axis I: Bipolar, Depressed  ADL's:  Intact  Sleep: Poor  Appetite:  Fair  Suicidal Ideation:  Plan:  denies Intent:  denies Means:  denies Homicidal Ideation:  Plan:  denies Intent:  denies Means:  denies AEB (as evidenced by):  Psychiatric Specialty Exam: Review of Systems  Constitutional: Positive for malaise/fatigue.  HENT: Negative.   Eyes: Negative.   Respiratory: Negative.   Cardiovascular: Negative.   Gastrointestinal: Negative.   Genitourinary: Negative.   Musculoskeletal: Positive for myalgias.  Skin: Negative.   Neurological: Negative.   Endo/Heme/Allergies: Negative.   Psychiatric/Behavioral: Positive for depression. The patient is nervous/anxious and has insomnia.     Blood pressure 103/68, pulse 78, temperature 97.6 F (36.4 C), temperature source Oral, resp. rate 18, height 6\' 3"  (1.905 m), weight 105.688 kg (233 lb).Body mass index is 29.12 kg/(m^2).  General Appearance: Disheveled  Eye Solicitor::  Fair  Speech:  Pressured and tends to slurred due to dentition   Volume:   Increased  Mood:  Anxious  Affect:  anxious, worried  Thought Process:  Coherent and Goal Directed  Orientation:  Full (Time, Place, and Person)  Thought Content:  worries, concerns, not wanting to get back to the same situation  Suicidal Thoughts:  Fleeting when he starts to think about his situation  Homicidal Thoughts:  No  Memory:  Immediate;   Fair Recent;   Fair Remote;   Fair  Judgement:  Fair  Insight:  Present and superficial  Psychomotor Activity:  Restlessness  Concentration:  Fair  Recall:  Fair  Akathisia:  No  Handed:  Right  AIMS (if indicated):     Assets:  Desire for Improvement  Sleep:  Number of Hours: 3   Current Medications: Current Facility-Administered Medications  Medication Dose Route Frequency Provider Last Rate Last Dose  . acetaminophen (TYLENOL) tablet 650 mg  650 mg Oral Q6H PRN Sanjuana Kava, NP   650 mg at 10/22/12 0336  . alum & mag hydroxide-simeth (MAALOX/MYLANTA) 200-200-20 MG/5ML suspension 30 mL  30 mL Oral Q4H PRN Sanjuana Kava, NP      . chlordiazePOXIDE (LIBRIUM) capsule 50 mg  50 mg Oral Once Sanjuana Kava, NP      . diphenhydrAMINE (BENADRYL) capsule 50 mg  50 mg Oral QHS PRN Verne Spurr, PA-C   50 mg at 10/20/12 2135  . magnesium hydroxide (MILK OF MAGNESIA) suspension 30 mL  30 mL Oral Daily PRN Sanjuana Kava, NP      . multivitamin with minerals tablet 1 tablet  1 tablet Oral Daily Sanjuana Kava, NP  1 tablet at 10/22/12 0801  . [START ON 10/23/2012] testosterone (ANDROGEL) gel 7.5 g  7.5 g Transdermal Daily Rachael Fee, MD      . thiamine (B-1) injection 100 mg  100 mg Intramuscular Once Sanjuana Kava, NP      . thiamine (VITAMIN B-1) tablet 100 mg  100 mg Oral Daily Sanjuana Kava, NP   100 mg at 10/22/12 0801  . traZODone (DESYREL) tablet 50 mg  50 mg Oral QHS,MR X 1 Spencer E Simon, PA-C   50 mg at 10/22/12 0045  . ziprasidone (GEODON) capsule 60 mg  60 mg Oral BID WC Sanjuana Kava, NP   60 mg at 10/22/12 0801    Lab Results:  No results found for this or any previous visit (from the past 48 hour(s)).  Physical Findings: AIMS: Facial and Oral Movements Muscles of Facial Expression: Mild Lips and Perioral Area: Minimal Jaw: Moderate Tongue: None, normal,Extremity Movements Upper (arms, wrists, hands, fingers): Moderate Lower (legs, knees, ankles, toes): Mild, Trunk Movements Neck, shoulders, hips: Mild, Overall Severity Severity of abnormal movements (highest score from questions above): Moderate Incapacitation due to abnormal movements: Mild Patient's awareness of abnormal movements (rate only patient's report): Aware, mild distress, Dental Status Current problems with teeth and/or dentures?: Yes Does patient usually wear dentures?: No  CIWA:  CIWA-Ar Total: 0 COWS:  COWS Total Score: 2  Treatment Plan Summary: Daily contact with patient to assess and evaluate symptoms and progress in treatment Medication management  Plan: Supportive approach/coping skills/relapse prevention           Will increase the Androgel to 7.5 mg (was using 10 mg)           Will increase the Trazodone to 100 mg HS PRN           Trial with Latuda 40 mg daily Medical Decision Making Problem Points:  Review of psycho-social stressors (1) Data Points:  Review of medication regiment & side effects (2) Review of new medications or change in dosage (2)  I certify that inpatient services furnished can reasonably be expected to improve the patient's condition.   Vaudie Engebretsen A 10/22/2012, 4:52 PM

## 2012-10-22 NOTE — Progress Notes (Signed)
The focus of this group is to educate the patient on the purpose and policies of crisis stabilization and provide a format to answer questions about their admission.  The group details unit policies and expectations of patients while admitted. Patient forward very little information.

## 2012-10-22 NOTE — Progress Notes (Signed)
Recreation Therapy Notes  Date: 08.26.2014 Time: 2:30pm Location: [address] Dayroom  Group Topic: Animal Assisted Activities  Goal Area(s) Addresses:  Patient will interact appropriately with dog team.    Behavioral Response: Did not attend  Nikhil Osei L Elani Delph, LRT/CTRS  Chaun Uemura L 10/22/2012 4:44 PM 

## 2012-10-22 NOTE — Progress Notes (Signed)
D: Patient denies SI/HI and A/V hallucinations; patient reports that he did not sleep well and that the trazodone dose was not enough and reports that "its like skittles to me"  A: Monitored q 15 minutes; patient encouraged to attend groups; patient educated about medications; patient given medications per physician orders; patient encouraged to express feelings and/or concerns  R: Patient has a good appetite and patient is participating and he is cooperative; patient's interaction with staff and peers is appropriate;patient is taking medications as prescribed and tolerating medications; patient is attending all groups but is forwarding little

## 2012-10-22 NOTE — [facility] Group Notes (Signed)
[facility] LCSW Group Therapy  10/22/2012  1:15 PM   Type of Therapy:  Group Therapy  Participation Level:  Did Not Attend  Inocente Krach Horton, LCSWA 10/22/2012 1:46 PM  

## 2012-10-22 NOTE — Progress Notes (Signed)
Recreation Therapy Notes  Date: 08.26.2014 Time: 3:00pm Location: [address] Dayroom  Group Topic: Communication, Team Building, Problem Solving  Goal Area(s) Addresses:  Patient will be able to recognize use of communication, team building and problem solving during course of group activity.  Patient will verbalize qualities used to make decisions during group session.  Patient will verbalize ability to use skills to build healthy support system post d/c.   Behavioral Response: Did not attend.  Marykay Lex Tylek Boney, LRT/CTRS  Jearl Klinefelter 10/22/2012 4:33 PM

## 2012-10-23 DIAGNOSIS — F1120 Opioid dependence, uncomplicated: Secondary | ICD-10-CM

## 2012-10-23 DIAGNOSIS — F1410 Cocaine abuse, uncomplicated: Secondary | ICD-10-CM

## 2012-10-23 DIAGNOSIS — F319 Bipolar disorder, unspecified: Secondary | ICD-10-CM

## 2012-10-23 MED ORDER — HYDROXYZINE HCL 50 MG PO TABS
50.0000 mg | ORAL_TABLET | Freq: Four times a day (QID) | ORAL | Status: DC | PRN
  Administered 2012-10-23: 50 mg via ORAL
  Filled 2012-10-23: qty 28

## 2012-10-23 NOTE — Progress Notes (Signed)
D: Patient denies SI/HI and A/V hallucinations; patient reports sleep is well; reports appetite is good ; reports energy level is low ; reports ability to pay attention is improving; rates depression as 5/10; rates hopelessness 2/10;   A: Monitored q 15 minutes; patient encouraged to attend groups; patient educated about medications; patient given medications per physician orders; patient encouraged to express feelings and/or concerns  R: Patient is  bright and animated;patient is cooperative.; patient's interaction with staff and peers is appropriate; ; patient is taking medications as prescribed and tolerating medications; patient is attending all groups and engaging

## 2012-10-23 NOTE — Progress Notes (Signed)
Attended NA meeting

## 2012-10-23 NOTE — Progress Notes (Addendum)
Patient ID: Brandon [name], male   DOB: 1966-01-15, 47 y.o.   MRN: [id number] D: Pt. C/o of anxiety and depression after visit with ex-GF who told him she will be relocating to [facility]. Pt. Rates depression at "10" of 10. "feel like crying my eyes out" Pt. Noted sitting in dayroom post compliant appeared relaxed as he was interacting with other clients. A: Writer introduced self to client and discussed meds. Staff will monitor q64min for safety.  Staff with S. Karleen Hampshire, PA for anxiety meds. Writer administered Vistaril 50 mg po. Pt. Encouraged to attend group. R: Pt. Is safe on the unit. Pt. Attended group. Pt. Argumentative when meds presented "vistaril, that ain't going to do nothing, I need something like so me Ativan".

## 2012-10-23 NOTE — Progress Notes (Signed)
[facility] MD Progress Note  10/23/2012 4:03 PM Brandon [name]  MRN:  [id number] Subjective:  Has tolerated the Latuda well so far. He states he wants to be sure he does not get to the point he was before. He states he still wants to try to make it work with his ex girlfriend but he will be ready if it does not work. His parents are going to allow him to stay with the. They are willing to give him a last chance. He know how important is for him not to mess up again. Still not sure if he is going to get his job back. He states he will not have a hard time finding a new job, but he would rather stay with the person he is working on now. Diagnosis:   DSM5: Schizophrenia Disorders:   Obsessive-Compulsive Disorders:   Trauma-Stressor Disorders:   Substance/Addictive Disorders:  Opioid Disorder - Severe (304.00), Cocaine Abuse Depressive Disorders:    Bipolar Disorder  ADL's:  Intact  Sleep: Fair  Appetite:  Fair  Suicidal Ideation:  Plan:  denies Intent:  denies Means:  denies Homicidal Ideation:  Plan:  denies Intent:  denies Means:  denies AEB (as evidenced by):  Psychiatric Specialty Exam: Review of Systems  Constitutional: Negative.   HENT: Negative.   Eyes: Negative.   Respiratory: Negative.   Cardiovascular: Negative.   Gastrointestinal: Negative.   Genitourinary: Negative.   Musculoskeletal: Negative.   Skin: Negative.   Neurological: Negative.   Endo/Heme/Allergies: Negative.   Psychiatric/Behavioral: Positive for depression and substance abuse. The patient is nervous/anxious and has insomnia.     Blood pressure 110/73, pulse 71, temperature 97.5 F (36.4 C), temperature source Oral, resp. rate 18, height 6\' 3"  (1.905 m), weight 105.688 kg (233 lb).Body mass index is 29.12 kg/(m^2).  General Appearance: Fairly Groomed  Patent attorney::  Fair  Speech:  Clear and Coherent, Pressured and slurred at times due to lack of teeth  Volume:  Increased  Mood:  Anxious and worried   Affect:  anxious, worried  Thought Process:  Coherent and Goal Directed  Orientation:  Full (Time, Place, and Person)  Thought Content:  worries, concerns  Suicidal Thoughts:  No  Homicidal Thoughts:  No  Memory:  Immediate;   Fair Recent;   Fair Remote;   Fair  Judgement:  Fair  Insight:  Present  Psychomotor Activity:  Restlessness  Concentration:  Fair  Recall:  Fair  Akathisia:  No  Handed:  Right  AIMS (if indicated):     Assets:  Desire for Improvement  Sleep:  Number of Hours: 6.75   Current Medications: Current Facility-Administered Medications  Medication Dose Route Frequency Provider Last Rate Last Dose  . acetaminophen (TYLENOL) tablet 650 mg  650 mg Oral Q6H PRN Sanjuana Kava, NP   650 mg at 10/22/12 0336  . alum & mag hydroxide-simeth (MAALOX/MYLANTA) 200-200-20 MG/5ML suspension 30 mL  30 mL Oral Q4H PRN Sanjuana Kava, NP      . chlordiazePOXIDE (LIBRIUM) capsule 50 mg  50 mg Oral Once Sanjuana Kava, NP      . diphenhydrAMINE (BENADRYL) capsule 50 mg  50 mg Oral QHS PRN Verne Spurr, PA-C   50 mg at 10/20/12 2135  . gabapentin (NEURONTIN) capsule 100 mg  100 mg Oral TID Rachael Fee, MD   100 mg at 10/23/12 1212  . lurasidone (LATUDA) tablet 40 mg  40 mg Oral Q breakfast Rachael Fee, MD  40 mg at 10/23/12 0755  . magnesium hydroxide (MILK OF MAGNESIA) suspension 30 mL  30 mL Oral Daily PRN Sanjuana Kava, NP      . multivitamin with minerals tablet 1 tablet  1 tablet Oral Daily Sanjuana Kava, NP   1 tablet at 10/23/12 0755  . testosterone (ANDROGEL) gel 7.5 g  7.5 g Transdermal Daily Rachael Fee, MD   7.5 g at 10/23/12 0755  . thiamine (B-1) injection 100 mg  100 mg Intramuscular Once Sanjuana Kava, NP      . thiamine (VITAMIN B-1) tablet 100 mg  100 mg Oral Daily Sanjuana Kava, NP   100 mg at 10/23/12 0755  . traZODone (DESYREL) tablet 100 mg  100 mg Oral QHS,MR X 1 Rachael Fee, MD   100 mg at 10/22/12 2247    Lab Results: No results found for this or any  previous visit (from the past 48 hour(s)).  Physical Findings: AIMS: Facial and Oral Movements Muscles of Facial Expression: Mild Lips and Perioral Area: Minimal Jaw: Moderate Tongue: None, normal,Extremity Movements Upper (arms, wrists, hands, fingers): Moderate Lower (legs, knees, ankles, toes): Mild, Trunk Movements Neck, shoulders, hips: Mild, Overall Severity Severity of abnormal movements (highest score from questions above): Moderate Incapacitation due to abnormal movements: Mild Patient's awareness of abnormal movements (rate only patient's report): Aware, mild distress, Dental Status Current problems with teeth and/or dentures?: Yes Does patient usually wear dentures?: No  CIWA:  CIWA-Ar Total: 0 COWS:  COWS Total Score: 2  Treatment Plan Summary: Daily contact with patient to assess and evaluate symptoms and progress in treatment Medication management  Plan: Supportive approach/coping skills/relapse prevention            Optimize treatment with Latuda  Medical Decision Making Problem Points:  Review of psycho-social stressors (1) Data Points:  Review of medication regiment & side effects (2) Review of new medications or change in dosage (2)  I certify that inpatient services furnished can reasonably be expected to improve the patient's condition.   Dhalia Zingaro A 10/23/2012, 4:03 PM

## 2012-10-23 NOTE — [facility] Group Notes (Signed)
[facility] LCSW Group Therapy  10/23/2012 2:44 PM  Type of Therapy:  Group Therapy  Participation Level:  Active  Participation Quality:  Appropriate  Affect:  Excited  Cognitive:  Oriented  Insight:  Limited  Engagement in Therapy:  Engaged  Modes of Intervention:  Confrontation, Discussion, Education, Socialization and Support  Summary of Progress/Problems: Emotion Regulation: This group focused on both positive and negative emotion identification and allowed group members to process ways to identify feelings, regulate negative emotions, and find healthy ways to manage internal/external emotions. Group members were asked to reflect on a time when their reaction to an emotion led to a negative outcome and explored how alternative responses using emotion regulation would have benefited them. Group members were also asked to discuss a time when emotion regulation was utilized when a negative emotion was experienced. Brandon [name] stated that he felt concerned and scared when he first entered the hospital. He was able to identify how these emotions felt both physically and mentally. Brandon [name] expressed that he currently feels motivated and happy. "I feel like the medication is working. I just feel good." Brandon [name] presented with pleasant mood and excited affect. Brandon [name] shows progress in the group setting AEB his attendance and high level of participation. He demonstrated limited insight AEB his inability to identify how "popping pills" is a negative response to dealing with negative emotions. He stated that his only motivation to stop taking pills is to "save money." He also identified his primary trigger as money. CSW suggested that pt get payee to help him manage money and avoid spending on pills. Pt was resistant to this idea, stating that he has "control issues." Pt feels that if he could get a job and get his fiance back, "my life would be perfect."    Smart, Amilliana Hayworth 10/23/2012, 2:44 PM

## 2012-10-23 NOTE — Progress Notes (Signed)
Adult Psychoeducational Group Note  Date:  10/23/2012 Time:  12:03 PM  Group Topic/Focus:  Crisis Planning:   The purpose of this group is to help patients create a crisis plan for use upon discharge or in the future, as needed.  Participation Level:  Minimal  Participation Quality:  Appropriate  Affect:  Appropriate  Cognitive:  Appropriate  Insight: Good  Engagement in Group:  Poor  Modes of Intervention:  Support  Additional Comments: Patient created Crisis Plan  Lauralee Evener 10/23/2012, 12:03 PM

## 2012-10-23 NOTE — Progress Notes (Signed)
D: Patient denies SI/HI/AVH. Patient affect is anxious. Mood is anxious.  Pt states, "Today has been so-so.  I had a good visit from my ex-fiance.  We may get back together.  I feel that my treatment here is working.  I want to get a psychiatrist to see once a week when I get out."  Patient did attend evening group. Patient visible on the milieu. No distress noted. A: Support and encouragement offered. Scheduled medications given to pt. Q 15 min checks continued for patient safety. R: Patient receptive. Patient remains safe on the unit.

## 2012-10-23 NOTE — [facility] Suicide Risk Assessment (Signed)
[facility] INPATIENT:  Family/Significant Other Suicide Prevention Education  Suicide Prevention Education:  Education Completed; Brandon Deboard Sr. (pt's father) has been identified by the patient as the family member/significant other with whom the patient will be residing, and identified as the person(s) who will aid the patient in the event of a mental health crisis (suicidal ideations/suicide attempt).  With written consent from the patient, the family member/significant other has been provided the following suicide prevention education, prior to the and/or following the discharge of the patient.  The suicide prevention education provided includes the following:  Suicide risk factors  Suicide prevention and interventions  National Suicide Hotline telephone number  [facility] assessment telephone number  [facility] Emergency Assistance 911  [facility] and/or Residential Mobile Crisis Unit telephone number  Request made of family/significant other to:  Remove weapons (e.g., guns, rifles, knives), all items previously/currently identified as safety concern.    Remove drugs/medications (over-the-counter, prescriptions, illicit drugs), all items previously/currently identified as a safety concern.  The family member/significant other verbalizes understanding of the suicide prevention education information provided.  The family member/significant other agrees to remove the items of safety concern listed above.  Brandon [name], Brandon [name] 10/23/2012, 2:34 PM

## 2012-10-23 NOTE — [facility] Group Notes (Signed)
[facility] LCSW Aftercare Discharge Planning Group Note   10/23/2012 9:15 AM  Participation Quality:  Appropriate   Mood/Affect:  Appropriate  Depression Rating:  0  Anxiety Rating:  0  Thoughts of Suicide:  No Will you contract for safety?   NA  Current AVH:  No  Plan for Discharge/Comments:  Pt stated that he was told he would be discharging yesterday. He could not identify who told him this. Pt stated that he felt much better and brighter today. He reported no signs of withdrawal. He will followup at [facility] and [facility] for med management. He will return home where he lives with his parents.   Transportation Means: parents  Supports: parents   Counselling psychologist, [place]

## 2012-10-24 MED ORDER — ACETAMINOPHEN 500 MG PO TABS: 1000.0000 mg | ORAL_TABLET | Freq: Four times a day (QID) | ORAL | Status: DC | PRN

## 2012-10-24 MED ORDER — FOLIC ACID 1 MG PO TABS: 1.0000 mg | ORAL_TABLET | Freq: Every day | ORAL | Status: DC

## 2012-10-24 MED ORDER — GABAPENTIN 100 MG PO CAPS: 100.0000 mg | ORAL_CAPSULE | Freq: Three times a day (TID) | ORAL | Status: DC

## 2012-10-24 MED ORDER — TRAZODONE HCL 100 MG PO TABS: 100.0000 mg | ORAL_TABLET | Freq: Every evening | ORAL | Status: DC | PRN

## 2012-10-24 MED ORDER — TESTOSTERONE 50 MG/5GM (1%) TD GEL: 7.5000 g | Freq: Every day | TRANSDERMAL | Status: DC

## 2012-10-24 MED ORDER — TESTOSTERONE CYPIONATE 200 MG/ML IM SOLN
200.0000 mg | Freq: Once | INTRAMUSCULAR | Status: AC
  Administered 2012-10-24: 200 mg via INTRAMUSCULAR

## 2012-10-24 MED ORDER — LURASIDONE HCL 40 MG PO TABS: 40.0000 mg | ORAL_TABLET | Freq: Every day | ORAL | Status: DC

## 2012-10-24 MED ORDER — ADULT MULTIVITAMIN W/MINERALS CH: 1.0000 | ORAL_TABLET | Freq: Every day | ORAL | Status: DC

## 2012-10-24 MED ORDER — HYDROXYZINE HCL 50 MG PO TABS: 50.0000 mg | ORAL_TABLET | Freq: Four times a day (QID) | ORAL | Status: DC | PRN

## 2012-10-24 NOTE — Discharge Summary (Signed)
Physician Discharge Summary Note  Patient:  Brandon [name] is an 47 y.o., male MRN:  [id number] DOB:  04/09/1965 Patient phone:  [phone] (home)  Patient address:   [address] [place] [address],   Date of Admission:  10/16/2012 Date of Discharge: 10/24/12  Reason for Admission:  Suicide attempt by overdose  Discharge Diagnoses: Active Problems:   Cocaine abuse   Bipolar affective disorder   OPIOID DEPENDENCE   COCAINE ABUSE   Suicidal ideation  Review of Systems  Constitutional: Negative.   HENT: Negative.   Eyes: Negative.   Respiratory: Negative.   Cardiovascular: Negative.   Gastrointestinal: Negative.   Genitourinary: Negative.   Musculoskeletal: Negative.   Skin: Negative.   Neurological: Negative.   Endo/Heme/Allergies: Negative.   Psychiatric/Behavioral: Positive for depression (Stabilized with medication prior to discharge) and substance abuse (Opioid depnednce, cocaine dependence). Negative for suicidal ideas, hallucinations and memory loss. The patient is nervous/anxious (Stabilized with medication prior to discharge) and has insomnia (Stabilized with medication prior to discharge).     DSM5:  Schizophrenia Disorders:  NA Obsessive-Compulsive Disorders:  NA Trauma-Stressor Disorders:  NA Substance/Addictive Disorders:  Opioid Disorder - Severe (304.00) Depressive Disorders:  Bipolar affective disorder  Axis Diagnosis:   AXIS I:  Opioid Disorder - Severe (304.00), Bipolar affective disorder AXIS II:  Deferred AXIS III:   Past Medical History  Diagnosis Date  . Drug abuse   . Kidney failure   . Bipolar 1 disorder   . Rhabdomyolysis   . Acute renal failure   . Opiate abuse, continuous   . Dental caries     extensive  . Anxiety   . Depression   . Bipolar affective disorder 03/17/2012  . Mood disorder 03/16/2012   AXIS IV:  other psychosocial or environmental problems and Substance dependence AXIS V:  63  Level of Care:   OP  Hospital Course:  This is yet another admission assessment for this 47 year old Caucasian male. Brandon [name] has had several inpatient admissions in this hospital in the past. Admitted to [facility] from the [facility] with complaints of suicide attempt by overdose. Patient reports, "The ambulance took me to the [facility] on Monday this week. My dad called them because I was very depressed. I have been depressed x a couple of months. On Monday, my depression worsened, I could not take it any more, I attempted suicide by overdose. I took bunch of Geodon pills. I passed out by the time the ambulance got to me. I don't know or remembered what happened when the EMS got me to the hospital. But I know that I was there x 3 days. I have bipolar disorder, had it for years. I used drugs too, I just don't remember the last time or how much I had used. I used heroin, cocaine and some other pills. I was sober for almost 3 years a long time ago. Right now, I feel fatigued, run down".  During his present hospital stay, Brandon [name] was assessed/evaluated and recommended to receive Librium/clonidine detoxification treatment protocols to stabilize his systems of drug intoxications and to combat the withdrawal symptoms as well. This decision was based on his UDS/toxicology results that showed blood (+) Benzodiazepine, amphetamine and reports of heroin use. He was also enrolled in group counseling sessions/activities where he was counseled, taught and learned coping skills that should help him after discharge to cope better and manage his problems with drug abuse/dependence to maintain a much longer  sobriety. Brandon [name] was also enrolled/attended AA/NA meetings being offered and held on this unit.  Besides detoxification treatment protocol, Brandon [name] was also ordered and received (Gabapentin) Neurontin 100 mg tid for anxiety management, Hydroxyzine 50 mg Q bedtime for sleep, Latuda 40 mg daily for mood control and Trazodone 100 mg  Q bedtime for sleep. He has other previously existing and or other mental issues and or concerns that required medication management/monitoring, he received medication management for all those health issues. He was monitored closely for any potential problems that may arise as a result of his treatments. Brandon [name] tolerated his treatment regimen without any significant adverse effects and or reactions presented.  Brandon [name] is currently is showing improved condition, this is evidenced by his verbal reports of improved symptoms, presentation of good affect and absence of tremors, dizziness etc. It was then decided that he is ready to start psychiatric treatment on an outpatient basis at the [facility] in [place], [place] on 10/29/12 with dr. Dicky Doe between the hours of 12:30 pm and 3:30 pm and [facility] at 2:00 pm. The addresses, dates, times and contact information for these appointments provided for patient in writing.   Upon discharge, patient adamantly denies any suicidal, homicidal ideations, auditory, visual hallucinations, delusional thoughts, paranoia and or withdrawal symptoms. He received from [facility] a 2 weeks worth supply samples of his [facility] discharge medications and 30 days worth prescriptions. Patient left [facility] with all personal belongings in no apparent distress. Transportation per family.  Consults:  psychiatry  Significant Diagnostic Studies:  labs: CBC with diff, CMP, UDS, Toxicology tests, U/A  Discharge Vitals:   Blood pressure 91/58, pulse 106, temperature 97.5 F (36.4 C), temperature source Oral, resp. rate 16, height 6\' 3"  (1.905 m), weight 105.688 kg (233 lb). Body mass index is 29.12 kg/(m^2). Lab Results:   No results found for this or any previous visit (from the past 72 hour(s)).  Physical Findings: AIMS: Facial and Oral Movements Muscles of Facial Expression: None, normal Lips and Perioral Area: None, normal Jaw: None,  normal Tongue: None, normal,Extremity Movements Upper (arms, wrists, hands, fingers): None, normal Lower (legs, knees, ankles, toes): None, normal, Trunk Movements Neck, shoulders, hips: None, normal, Overall Severity Severity of abnormal movements (highest score from questions above): None, normal Incapacitation due to abnormal movements: None, normal Patient's awareness of abnormal movements (rate only patient's report): No Awareness, Dental Status Current problems with teeth and/or dentures?: Yes Does patient usually wear dentures?: No  CIWA:  CIWA-Ar Total: 0 COWS:  COWS Total Score: 2  Psychiatric Specialty Exam: See Psychiatric Specialty Exam and Suicide Risk Assessment completed by Attending Physician prior to discharge.  Discharge destination:  Home  Is patient on multiple antipsychotic therapies at discharge:  No   Has Patient had three or more failed trials of antipsychotic monotherapy by history:  No  Recommended Plan for Multiple Antipsychotic Therapies: NA      Discharge Orders   Future Orders Complete By Expires   Discharge instructions  As directed    Comments:     Take all your medications as prescribed by your mental healthcare provider. Report any adverse effects and or reactions from your medicines to your outpatient provider promptly. You are hereby instructed and cautioned to not engage in alcohol and or illegal drug use while on prescription medicines. In the event of worsening symptoms, patient is instructed to call the crisis hotline, 911 and or go to the  nearest ED for appropriate evaluation and treatment of symptoms. Follow-up with your primary care provider for your other medical issues, concerns and or health care needs.       Medication List    STOP taking these medications       ziprasidone 60 MG capsule  Commonly known as:  GEODON      TAKE these medications     Indication   acetaminophen 500 MG tablet  Commonly known as:  TYLENOL  Take  2 tablets (1,000 mg total) by mouth every 6 (six) hours as needed for pain.   Indication:  Pain     folic acid 1 MG tablet  Commonly known as:  FOLVITE  Take 1 tablet (1 mg total) by mouth daily. For low folic acid   Indication:  Deficiency of Folic Acid in the Diet     gabapentin 100 MG capsule  Commonly known as:  NEURONTIN  Take 1 capsule (100 mg total) by mouth 3 (three) times daily. For anxiety   Indication:  Agitation, Pain, Anxiety     hydrOXYzine 50 MG tablet  Commonly known as:  ATARAX/VISTARIL  Take 1 tablet (50 mg total) by mouth every 6 (six) hours as needed for anxiety.   Indication:  Anxiety associated with Organic Disease     lurasidone 40 MG Tabs tablet  Commonly known as:  LATUDA  Take 1 tablet (40 mg total) by mouth daily with breakfast. For mood control   Indication:  Mood control     multivitamin with minerals Tabs tablet  Take 1 tablet by mouth daily. For low vitamin   Indication:  Low Vitamin     testosterone 50 MG/5GM Gel  Commonly known as:  ANDROGEL  Place 7.5 g onto the skin daily. For low testosterone   Indication:  Androgen Deficiency     traZODone 100 MG tablet  Commonly known as:  DESYREL  Take 1 tablet (100 mg total) by mouth at bedtime and may repeat dose one time if needed. For sleep   Indication:  Trouble Sleeping       Follow-up Information   [facility]. (Appt with Rolan Lipa at [facility]. )    Contact information:   [address]  [place], [place] [address] Phone: [phone] Fax: [phone]      Follow up with [facility] On 10/29/2012. (Walk in between 12:30Pm-3:30PM for hospital followup with Dr. Dicky Doe (Dr. B).)    Contact information:   [address] [place], [place] [address] Phone: [phone] Fax: [phone]     Follow-up recommendations:  Activity:  As tolerated Diet: As recommended by your primary care doctor. Keep all scheduled follow-up appointments as  recommended. Continue to work your relapse prevention plan Comments:  Take all your medications as prescribed by your mental healthcare provider. Report any adverse effects and or reactions from your medicines to your outpatient provider promptly. Patient is instructed and cautioned to not engage in alcohol and or illegal drug use while on prescription medicines. In the event of worsening symptoms, patient is instructed to call the crisis hotline, 911 and or go to the nearest ED for appropriate evaluation and treatment of symptoms. Follow-up with your primary care provider for your other medical issues, concerns and or health care needs.   Total Discharge Time:  Greater than 30 minutes.  Signed: Sanjuana Kava, PMHNP-BC Agree with assessment and plan Madie Reno A. Dub Mikes, M.D 10/24/2012, 4:59 PM

## 2012-10-24 NOTE — Progress Notes (Signed)
Adult Psychoeducational Group Note  Date:  10/24/2012 Time:  10:27 AM  Group Topic/Focus:  Alcoholics Anonymous  Participation Level:  Did Not Attend  Participation Quality:  Did not attend  Affect:  Did not attend  Cognitive:  Did not attend  Insight: Did not attend  Engagement in Group:  Did not attend  Modes of Intervention:  Did not attend  Additional Comments:  Did not attend  Brandon [name] 10/24/2012, 10:27 AM

## 2012-10-24 NOTE — Progress Notes (Signed)
The focus of this group is to educate the patient on the purpose and policies of crisis stabilization and provide a format to answer questions about their admission.  The group details unit policies and expectations of patients while admitted. Did not attend.  

## 2012-10-24 NOTE — [facility] Suicide Risk Assessment (Signed)
Suicide Risk Assessment  Discharge Assessment     Demographic Factors:  Male and Caucasian  Mental Status Per Nursing Assessment::   On Admission:  NA  Current Mental Status by Physician: In full contact with reality. There are no suicidal ideas, plans or intent. His mood is euthymic, his affect is appropriate. He is willing and motivated to pursue taking his medications, follow up with outpatient providers, abstain from using drugs and alcohol   Loss Factors: NA  Historical Factors: NA  Risk Reduction Factors:   Living with another person, especially a relative and Positive social support  Continued Clinical Symptoms:  Bipolar Disorder:   Depressive phase Alcohol/Substance Abuse/Dependencies  Cognitive Features That Contribute To Risk:  Closed-mindedness Polarized thinking Thought constriction (tunnel vision)    Suicide Risk:  Minimal: No identifiable suicidal ideation.  Patients presenting with no risk factors but with morbid ruminations; may be classified as minimal risk based on the severity of the depressive symptoms  Discharge Diagnoses:   AXIS I:  Opioid Dependence, Cocaine Abuse, Bipolar Depressed AXIS II:  Deferred AXIS III:   Past Medical History  Diagnosis Date  . Drug abuse   . Kidney failure   . Bipolar 1 disorder   . Rhabdomyolysis   . Acute renal failure   . Opiate abuse, continuous   . Dental caries     extensive  . Anxiety   . Depression   . Bipolar affective disorder 03/17/2012  . Mood disorder 03/16/2012   AXIS IV:  other psychosocial or environmental problems AXIS V:  61-70 mild symptoms  Plan Of Care/Follow-up recommendations:  Activity:  as tolerated Diet:  regular Follow up outpatient basis Continue medications Pursue follow up for the low testosterone Is patient on multiple antipsychotic therapies at discharge:  No   Has Patient had three or more failed trials of antipsychotic monotherapy by history:  No  Recommended Plan for  Multiple Antipsychotic Therapies: NA  Russie Gulledge A 10/24/2012, 2:03 PM

## 2012-10-24 NOTE — Progress Notes (Signed)
[facility] Adult Case Management Discharge Plan :  Will you be returning to the same living situation after discharge: Yes,  home with parents At discharge, do you have transportation home?:Yes,  father Do you have the ability to pay for your medications:Yes,  mental health  Release of information consent forms completed and in the chart;  Patient's signature needed at discharge.  Patient to Follow up at: Follow-up Information   Follow up with [facility] On 10/30/2012. (Appt with Rolan Lipa at [facility]. )    Contact information:   [address]  [place], [place] [address] Phone: [phone] Fax: [phone]      Follow up with [facility] On 10/29/2012. (Walk in between 12:30Pm-3:30PM for hospital followup with Dr. Dicky Doe (Dr. B).)    Contact information:   [address] [place], [place] [address] Phone: [phone] Fax: [phone]      Patient denies SI/HI:   Yes,  during group/self report.     Safety Planning and Suicide Prevention discussed:  Yes,  SPE completed with pt's father, Davin Archuletta Sr. SPI pamphlet provided to pt and he was encouraged to ask questions and talk about concerns.   Smart, Arlene Brickel 10/24/2012, 9:31 AM

## 2012-10-24 NOTE — Progress Notes (Signed)
Patient ID: Brandon [name], male   DOB: [birth year], 47 y.o.   MRN: [id number] D:  Patient discharged to home.  All belongings retrieved from room and from locker.    A:  Reviewed all discharge instructions, medications, and follow up care with patient.  Escorted patient off the unit and out to the front lobby.   R:  Patient verbalized understanding of all instructions.  He left the unit ambulatory and denies suicidal ideation.  He states he is ready to leave the hospital setting.

## 2012-10-29 NOTE — Progress Notes (Signed)
Patient Discharge Instructions:  After Visit Summary (AVS):   Faxed to:  10/29/12 Discharge Summary Note:   Faxed to:  10/29/12 Psychiatric Admission Assessment Note:   Faxed to:  10/29/12 Suicide Risk Assessment - Discharge Assessment:   Faxed to:  10/29/12 Faxed/Sent to the Next Level Care provider:  10/29/12 Faxed to [facility] @ [phone] Faxed to [facility] @ [phone]  [name], 10/29/2012, 3:04 PM

## 2012-11-02 ENCOUNTER — Encounter (HOSPITAL_BASED_OUTPATIENT_CLINIC_OR_DEPARTMENT_OTHER): Payer: Self-pay | Admitting: Emergency Medicine

## 2012-11-02 ENCOUNTER — Inpatient Hospital Stay (HOSPITAL_BASED_OUTPATIENT_CLINIC_OR_DEPARTMENT_OTHER)
Admission: EM | Admit: 2012-11-02 | Discharge: 2012-11-05 | DRG: 918 | Disposition: A | Discharge status: Other Acute Inpatient Hospital | Payer: Self-pay | Attending: Internal Medicine | Admitting: Internal Medicine

## 2012-11-02 DIAGNOSIS — T50901A Poisoning by unspecified drugs, medicaments and biological substances, accidental (unintentional), initial encounter: Secondary | ICD-10-CM

## 2012-11-02 DIAGNOSIS — T43621A Poisoning by amphetamines, accidental (unintentional), initial encounter: Secondary | ICD-10-CM

## 2012-11-02 DIAGNOSIS — F319 Bipolar disorder, unspecified: Secondary | ICD-10-CM | POA: Diagnosis present

## 2012-11-02 DIAGNOSIS — F3130 Bipolar disorder, current episode depressed, mild or moderate severity, unspecified: Secondary | ICD-10-CM | POA: Diagnosis present

## 2012-11-02 DIAGNOSIS — F1110 Opioid abuse, uncomplicated: Secondary | ICD-10-CM | POA: Diagnosis present

## 2012-11-02 DIAGNOSIS — F1120 Opioid dependence, uncomplicated: Secondary | ICD-10-CM

## 2012-11-02 DIAGNOSIS — T43502A Poisoning by unspecified antipsychotics and neuroleptics, intentional self-harm, initial encounter: Secondary | ICD-10-CM | POA: Diagnosis present

## 2012-11-02 DIAGNOSIS — F39 Unspecified mood [affective] disorder: Secondary | ICD-10-CM

## 2012-11-02 DIAGNOSIS — Z91018 Allergy to other foods: Secondary | ICD-10-CM

## 2012-11-02 DIAGNOSIS — F411 Generalized anxiety disorder: Secondary | ICD-10-CM | POA: Diagnosis present

## 2012-11-02 DIAGNOSIS — D649 Anemia, unspecified: Secondary | ICD-10-CM | POA: Diagnosis present

## 2012-11-02 DIAGNOSIS — R45851 Suicidal ideations: Secondary | ICD-10-CM

## 2012-11-02 DIAGNOSIS — T43694A Poisoning by other psychostimulants, undetermined, initial encounter: Principal | ICD-10-CM | POA: Diagnosis present

## 2012-11-02 DIAGNOSIS — Z79899 Other long term (current) drug therapy: Secondary | ICD-10-CM

## 2012-11-02 DIAGNOSIS — K3184 Gastroparesis: Secondary | ICD-10-CM

## 2012-11-02 DIAGNOSIS — F1910 Other psychoactive substance abuse, uncomplicated: Secondary | ICD-10-CM

## 2012-11-02 DIAGNOSIS — F1410 Cocaine abuse, uncomplicated: Secondary | ICD-10-CM | POA: Diagnosis present

## 2012-11-02 DIAGNOSIS — T43624A Poisoning by amphetamines, undetermined, initial encounter: Secondary | ICD-10-CM

## 2012-11-02 LAB — COMPREHENSIVE METABOLIC PANEL
ALT: 19 U/L (ref 0–53)
AST: 29 U/L (ref 0–37)
Albumin: 3.8 g/dL (ref 3.5–5.2)
Alkaline Phosphatase: 59 U/L (ref 39–117)
Calcium: 9.5 mg/dL (ref 8.4–10.5)
Potassium: 3.7 mEq/L (ref 3.5–5.1)
Sodium: 137 mEq/L (ref 135–145)
Total Protein: 7.1 g/dL (ref 6.0–8.3)

## 2012-11-02 LAB — CBC WITH DIFFERENTIAL/PLATELET
Basophils Absolute: 0 10*3/uL (ref 0.0–0.1)
Eosinophils Absolute: 0.1 10*3/uL (ref 0.0–0.7)
Eosinophils Relative: 1 % (ref 0–5)
Lymphs Abs: 1.8 10*3/uL (ref 0.7–4.0)
MCH: 31.3 pg (ref 26.0–34.0)
MCV: 90.3 fL (ref 78.0–100.0)
Neutrophils Relative %: 66 % (ref 43–77)
Platelets: 440 10*3/uL — ABNORMAL HIGH (ref 150–400)
RBC: 4.31 MIL/uL (ref 4.22–5.81)
RDW: 12.8 % (ref 11.5–15.5)
WBC: 9.8 10*3/uL (ref 4.0–10.5)

## 2012-11-02 LAB — RAPID URINE DRUG SCREEN, HOSP PERFORMED
Amphetamines: POSITIVE — AB
Barbiturates: NOT DETECTED
Benzodiazepines: POSITIVE — AB
Cocaine: NOT DETECTED
Tetrahydrocannabinol: NOT DETECTED

## 2012-11-02 LAB — CBC
HCT: 36 % — ABNORMAL LOW (ref 39.0–52.0)
Hemoglobin: 12.7 g/dL — ABNORMAL LOW (ref 13.0–17.0)
MCH: 31.6 pg (ref 26.0–34.0)
MCHC: 35.3 g/dL (ref 30.0–36.0)
MCV: 89.6 fL (ref 78.0–100.0)
RBC: 4.02 MIL/uL — ABNORMAL LOW (ref 4.22–5.81)

## 2012-11-02 LAB — BASIC METABOLIC PANEL
BUN: 11 mg/dL (ref 6–23)
CO2: 23 mEq/L (ref 19–32)
Chloride: 103 mEq/L (ref 96–112)
GFR calc non Af Amer: >90 mL/min (ref >90)
Glucose, Bld: 97 mg/dL (ref 70–99)
Potassium: 3.7 mEq/L (ref 3.5–5.1)
Sodium: 138 mEq/L (ref 135–145)

## 2012-11-02 LAB — ACETAMINOPHEN LEVEL: Acetaminophen (Tylenol), Serum: <15 ug/mL (ref 10–30)

## 2012-11-02 MED ORDER — ALUM & MAG HYDROXIDE-SIMETH 200-200-20 MG/5ML PO SUSP: 30.0000 mL | Freq: Four times a day (QID) | ORAL | Status: DC | PRN

## 2012-11-02 MED ORDER — CLONIDINE HCL 0.1 MG PO TABS
0.1000 mg | ORAL_TABLET | Freq: Two times a day (BID) | ORAL | Status: DC
  Filled 2012-11-02 (×2): qty 1

## 2012-11-02 MED ORDER — CLONIDINE HCL 0.1 MG PO TABS
0.1000 mg | ORAL_TABLET | Freq: Three times a day (TID) | ORAL | Status: DC
  Administered 2012-11-02 – 2012-11-04 (×6): 0.1 mg via ORAL
  Filled 2012-11-02 (×9): qty 1

## 2012-11-02 MED ORDER — SODIUM CHLORIDE 0.9 % IV BOLUS (SEPSIS)
1000.0000 mL | Freq: Once | INTRAVENOUS | Status: AC
  Administered 2012-11-02: 1000 mL via INTRAVENOUS

## 2012-11-02 MED ORDER — SODIUM CHLORIDE 0.9 % IV SOLN: Freq: Once | INTRAVENOUS | Status: DC

## 2012-11-02 MED ORDER — LORAZEPAM 2 MG/ML IJ SOLN
0.5000 mg | Freq: Once | INTRAMUSCULAR | Status: AC
  Administered 2012-11-02: 03:00:00 via INTRAVENOUS
  Filled 2012-11-02: qty 1

## 2012-11-02 MED ORDER — LORAZEPAM 2 MG/ML IJ SOLN
1.0000 mg | INTRAMUSCULAR | Status: DC | PRN
  Administered 2012-11-02: 1 mg via INTRAVENOUS
  Filled 2012-11-02: qty 1

## 2012-11-02 MED ORDER — OXYCODONE HCL 5 MG PO TABS: 5.0000 mg | ORAL_TABLET | ORAL | Status: DC | PRN

## 2012-11-02 MED ORDER — ACETAMINOPHEN 325 MG PO TABS: 650.0000 mg | ORAL_TABLET | Freq: Four times a day (QID) | ORAL | Status: DC | PRN

## 2012-11-02 MED ORDER — PANTOPRAZOLE SODIUM 40 MG IV SOLR
40.0000 mg | INTRAVENOUS | Status: DC
  Filled 2012-11-02: qty 40

## 2012-11-02 MED ORDER — ONDANSETRON HCL 4 MG PO TABS: 4.0000 mg | ORAL_TABLET | Freq: Four times a day (QID) | ORAL | Status: DC | PRN

## 2012-11-02 MED ORDER — LORAZEPAM 2 MG/ML IJ SOLN: 0.0000 mg | Freq: Two times a day (BID) | INTRAMUSCULAR | Status: DC

## 2012-11-02 MED ORDER — MAGIC MOUTHWASH
15.0000 mL | Freq: Four times a day (QID) | ORAL | Status: DC | PRN
  Administered 2012-11-03: 15 mL via ORAL
  Filled 2012-11-02 (×2): qty 15

## 2012-11-02 MED ORDER — ONDANSETRON HCL 4 MG/2ML IJ SOLN: 4.0000 mg | Freq: Four times a day (QID) | INTRAMUSCULAR | Status: DC | PRN

## 2012-11-02 MED ORDER — LORAZEPAM 2 MG/ML IJ SOLN
0.5000 mg | Freq: Once | INTRAMUSCULAR | Status: AC
  Administered 2012-11-02: 0.5 mg via INTRAVENOUS
  Filled 2012-11-02: qty 1

## 2012-11-02 MED ORDER — LORAZEPAM 2 MG/ML IJ SOLN
0.0000 mg | Freq: Four times a day (QID) | INTRAMUSCULAR | Status: DC
  Administered 2012-11-02: 1 mg via INTRAVENOUS
  Administered 2012-11-03 (×2): 2 mg via INTRAVENOUS
  Administered 2012-11-03 (×2): 1 mg via INTRAVENOUS
  Administered 2012-11-04: 2 mg via INTRAVENOUS
  Administered 2012-11-04: 1 mg via INTRAVENOUS
  Filled 2012-11-02 (×6): qty 1

## 2012-11-02 MED ORDER — HYDROXYZINE HCL 25 MG PO TABS
50.0000 mg | ORAL_TABLET | Freq: Four times a day (QID) | ORAL | Status: DC | PRN
  Administered 2012-11-04 – 2012-11-05 (×2): 50 mg via ORAL
  Filled 2012-11-02 (×3): qty 2

## 2012-11-02 MED ORDER — ACETAMINOPHEN 650 MG RE SUPP: 650.0000 mg | Freq: Four times a day (QID) | RECTAL | Status: DC | PRN

## 2012-11-02 MED ORDER — HYDROMORPHONE HCL PF 1 MG/ML IJ SOLN: 0.5000 mg | INTRAMUSCULAR | Status: DC | PRN

## 2012-11-02 MED ORDER — SODIUM CHLORIDE 0.9 % IV SOLN
Freq: Once | INTRAVENOUS | Status: AC
  Administered 2012-11-02: 01:00:00 via INTRAVENOUS

## 2012-11-02 MED ORDER — ENOXAPARIN SODIUM 40 MG/0.4ML ~~LOC~~ SOLN
40.0000 mg | SUBCUTANEOUS | Status: DC
  Administered 2012-11-02 – 2012-11-05 (×4): 40 mg via SUBCUTANEOUS
  Filled 2012-11-02 (×4): qty 0.4

## 2012-11-02 MED ORDER — DIPHENHYDRAMINE HCL 50 MG/ML IJ SOLN
25.0000 mg | Freq: Once | INTRAMUSCULAR | Status: AC
  Administered 2012-11-02: 25 mg via INTRAVENOUS

## 2012-11-02 MED ORDER — TRAMADOL HCL 50 MG PO TABS
50.0000 mg | ORAL_TABLET | Freq: Four times a day (QID) | ORAL | Status: DC | PRN
  Administered 2012-11-02 – 2012-11-04 (×2): 50 mg via ORAL
  Filled 2012-11-02 (×2): qty 1

## 2012-11-02 MED ORDER — DIPHENHYDRAMINE HCL 50 MG/ML IJ SOLN
INTRAMUSCULAR | Status: AC
  Administered 2012-11-02: 25 mg via INTRAVENOUS
  Filled 2012-11-02: qty 1

## 2012-11-02 MED ORDER — SODIUM CHLORIDE 0.9 % IV SOLN
INTRAVENOUS | Status: DC
  Administered 2012-11-02: 75 mL via INTRAVENOUS
  Administered 2012-11-02 – 2012-11-03 (×3): via INTRAVENOUS
  Administered 2012-11-04: 50 mL via INTRAVENOUS

## 2012-11-02 MED ORDER — LORAZEPAM 2 MG/ML IJ SOLN
1.0000 mg | Freq: Four times a day (QID) | INTRAMUSCULAR | Status: DC | PRN
  Administered 2012-11-02: 1 mg via INTRAVENOUS
  Filled 2012-11-02 (×2): qty 1

## 2012-11-02 NOTE — Progress Notes (Signed)
Pt requesting to see social worker and be placed in a certain rehab he "heard good things about."

## 2012-11-02 NOTE — H&P (Signed)
[facility] History and Physical  AZAN MANERI WUX:[id number] DOB: Apr 05, 1965 DOA: 11/02/2012  Referring physician:  EDP PCP: No primary provider on file.  Specialists:   Chief Complaint: Wendelyn Breslow  HPI: Brandon [name] is a 47 y.o. male who presents to the ED after taking an overdose of Adderall tablets.  He reports taking 24 + 20 mg Adderall tablets in order to get high.  He denied having any suicidal ideation.  He felt pain and swelling in his tongue and thought that he may have been suffering from an allergic reaction. So he told his father who had him sent to the ED at [facility].   He was evslauted there an his UDS was positive for Benzodiazepines, and Amphetamines, and Poison Control was contacted and recommended cardiovascular monitoring for the next 24 to 48 hours.     Review of Systems: The patient denies anorexia, fever, chills, headaches, weight loss,, vision loss, diplopia, dizziness, decreased hearing, rhinitis, hoarseness, chest pain, syncope, dyspnea on exertion, peripheral edema, balance deficits, cough, hemoptysis, abdominal pain, nausea, vomiting, diarrhea, constipation, hematemesis, melena, hematochezia, severe indigestion/heartburn, dysuria, hematuria, incontinence, muscle weakness, suspicious skin lesions, transient blindness, difficulty walking, depression, unusual weight change, abnormal bleeding, enlarged lymph nodes, angioedema, and breast masses.    Past Medical History  Diagnosis Date  . Drug abuse   . Kidney failure   . Bipolar 1 disorder   . Rhabdomyolysis   . Acute renal failure   . Opiate abuse, continuous   . Dental caries     extensive  . Anxiety   . Depression   . Bipolar affective disorder 03/17/2012  . Mood disorder 03/16/2012    Past Surgical History  Procedure Laterality Date  . Knee surgery      x 5    Prior to Admission medications   Medication Sig Start Date End Date Taking? Authorizing Provider  acetaminophen (TYLENOL) 500 MG tablet Take 2  tablets (1,000 mg total) by mouth every 6 (six) hours as needed for pain. 10/24/12   Sanjuana Kava, NP  folic acid (FOLVITE) 1 MG tablet Take 1 tablet (1 mg total) by mouth daily. For low folic acid 10/24/12   Sanjuana Kava, NP  gabapentin (NEURONTIN) 100 MG capsule Take 1 capsule (100 mg total) by mouth 3 (three) times daily. For anxiety 10/24/12   Sanjuana Kava, NP  hydrOXYzine (ATARAX/VISTARIL) 50 MG tablet Take 1 tablet (50 mg total) by mouth every 6 (six) hours as needed for anxiety. 10/24/12   Sanjuana Kava, NP  lurasidone (LATUDA) 40 MG TABS tablet Take 1 tablet (40 mg total) by mouth daily with breakfast. For mood control 10/24/12   Sanjuana Kava, NP  Multiple Vitamin (MULTIVITAMIN WITH MINERALS) TABS tablet Take 1 tablet by mouth daily. For low vitamin 10/24/12   Sanjuana Kava, NP  testosterone (ANDROGEL) 50 MG/5GM GEL Place 7.5 g onto the skin daily. For low testosterone 10/24/12   Sanjuana Kava, NP  traZODone (DESYREL) 100 MG tablet Take 1 tablet (100 mg total) by mouth at bedtime and may repeat dose one time if needed. For sleep 10/24/12   Sanjuana Kava, NP    Allergies  Allergen Reactions  . Bean Pod Extract     [place], kidney and pork&beans    Social History:  reports that he has never smoked. He has never used smokeless tobacco. He reports that he uses illicit drugs (Oxycodone, Amphetamines, Barbituates, Cocaine, and Methamphetamines). He reports that he does not  drink alcohol.     Family History  Problem Relation Age of Onset  . Heart disease Mother   . Heart disease Father        Physical Exam:  GEN:   Anxious and Figety 47 y.o.thin well developed Caucasian male  examined  and in no acute distress; cooperative with exam Filed Vitals:   11/02/12 0120 11/02/12 0218 11/02/12 0233 11/02/12 0330  BP:   111/73 131/72  Pulse:  74 80 73  Temp:    98.7 F (37.1 C)  TempSrc:    Oral  Resp: 23 28 24    Height:    6\' 3"  (1.905 m)  Weight:    97.7 kg (215 lb 6.2 oz)  SpO2:  98% 94%  99%   Blood pressure 131/72, pulse 73, temperature 98.7 F (37.1 C), temperature source Oral, resp. rate 24, height 6\' 3"  (1.905 m), weight 97.7 kg (215 lb 6.2 oz), SpO2 99.00%. PSYCH: He is alert and oriented x4; does not appear anxious does not appear depressed; affect is normal HEENT: Normocephalic and Atraumatic, Mucous membranes pink; PERRLA; EOM intact; Fundi:  Benign;  No scleral icterus, Nares: Patent, Oropharynx: Clear, Fair Dentition, Neck:  FROM, no cervical lymphadenopathy nor thyromegaly or carotid bruit; no JVD; Breasts:: Not examined CHEST WALL: No tenderness CHEST: Normal respiration, clear to auscultation bilaterally HEART: Regular rate and rhythm; no murmurs rubs or gallops BACK: No kyphosis or scoliosis; no CVA tenderness ABDOMEN: Positive Bowel Sounds, soft non-tender; no masses, no organomegaly. Rectal Exam: Not done EXTREMITIES: No cyanosis, clubbing or edema; no ulcerations. Genitalia: not examined PULSES: 2+ and symmetric SKIN: Normal hydration no rash or ulceration CNS: Cranial nerves 2-12 grossly intact no focal neurologic deficit    Labs on Admission:  Basic Metabolic Panel:  Recent Labs Lab 11/02/12 0026  NA 137  K 3.7  CL 98  CO2 26  GLUCOSE 158*  BUN 13  CREATININE 1.00  CALCIUM 9.5   Liver Function Tests:  Recent Labs Lab 11/02/12 0026  AST 29  ALT 19  ALKPHOS 59  BILITOT 0.8  PROT 7.1  ALBUMIN 3.8   No results found for this basename: LIPASE, AMYLASE,  in the last 168 hours No results found for this basename: AMMONIA,  in the last 168 hours CBC:  Recent Labs Lab 11/02/12 0026  WBC 9.8  NEUTROABS 6.4  HGB 13.5  HCT 38.9*  MCV 90.3  PLT 440*   Cardiac Enzymes: No results found for this basename: CKTOTAL, CKMB, CKMBINDEX, TROPONINI,  in the last 168 hours  BNP (last 3 results)  Recent Labs  05/25/12 1825  PROBNP 43.1   CBG: No results found for this basename: GLUCAP,  in the last 168 hours  Radiological Exams on  Admission: No results found.      Assessment/Plan Principal Problem:   Overdose by amphetamine Active Problems:   ANEMIA NOS   Bipolar affective disorder   Cocaine abuse   1.   Overdose by Amphetamine-monitor for cardiovascular changes or respiratory compromise.   PRN IV Ativan and Clonidine BID.     2.   Anemia- check Anemia panel.     3.   Bipolar- Stable versus Unstable.     4,   Cocaine Abuse- last used oer report 5 days ago, UDS-  5.   DVT Prophylaxis with Lovenox.       Code Status:  FULL CODE Family Communication:  No Family Present   Disposition Plan:    Inpatient  Time  spent:  70 Minutes  Ron Parker [facility] Pager [phone]  If 7PM-7AM, please contact night-coverage [email or web address] Password [facility] 11/02/2012, 4:42 AM

## 2012-11-02 NOTE — Progress Notes (Signed)
Shireen Quan from "Poison Control " called to enquire re Pt's VS ,neuro assessment  and  prn meds given .

## 2012-11-02 NOTE — Progress Notes (Signed)
HALLUCINATING ( BRIEF VISUAL DISTURBANCES---PT RE-ORIENTED READILY),diaphoretic,tremors notable,c/o generalized pain ,jerking movements ( ea one single movement -at -a time) upwards( towards ceiling), Pt stated he feels like he is going through withdrawals .Ativan 1 mg given IV .

## 2012-11-02 NOTE — ED Notes (Signed)
Pt reports oral swelling, has none on assessment. Has anxiety, is odd-behaving, rapid movements, reports taking 24 x 20mg  Adderoll since yesterday at 3pm to "get high" denies SI and HI, denies other illicit ingestion.

## 2012-11-02 NOTE — ED Provider Notes (Signed)
CSN: [id number]     Arrival date & time 11/02/12  0015 History   First MD Initiated Contact with Patient 11/02/12 0025     Chief Complaint  Patient presents with  . Oral Swelling   (Consider location/radiation/quality/duration/timing/severity/associated sxs/prior Treatment) Patient is a 47 y.o. male presenting with Overdose. The history is provided by the patient and a parent.  Drug Overdose This is a recurrent problem. The current episode started yesterday. The problem occurs constantly. The problem has not changed since onset.Pertinent negatives include no chest pain, no abdominal pain, no headaches and no shortness of breath. Nothing aggravates the symptoms. Nothing relieves the symptoms. He has tried nothing for the symptoms. The treatment provided no relief.  States his tongue is swollen but is not.  Speech is pressured and face face facial ticks and frequent high pitched sounds.  States he took at least 24 20 mg of adderrall since 3 pm yesterday in an attempt to get high.    Past Medical History  Diagnosis Date  . Drug abuse   . Kidney failure   . Bipolar 1 disorder   . Rhabdomyolysis   . Acute renal failure   . Opiate abuse, continuous   . Dental caries     extensive  . Anxiety   . Depression   . Bipolar affective disorder 03/17/2012  . Mood disorder 03/16/2012   Past Surgical History  Procedure Laterality Date  . Knee surgery    . No past surgeries      hx 5 rt knee surgeries   Family History  Problem Relation Age of Onset  . Heart disease Mother   . Heart disease Father    History  Substance Use Topics  . Smoking status: Never Smoker   . Smokeless tobacco: Never Used  . Alcohol Use: No    Review of Systems  Respiratory: Negative for shortness of breath.   Cardiovascular: Negative for chest pain.  Gastrointestinal: Negative for abdominal pain.  Neurological: Negative for headaches.  Psychiatric/Behavioral: Negative for suicidal ideas, hallucinations, confusion  and self-injury. The patient is hyperactive.   All other systems reviewed and are negative.    Allergies  Bean pod extract  Home Medications   Current Outpatient Rx  Name  Route  Sig  Dispense  Refill  . acetaminophen (TYLENOL) 500 MG tablet   Oral   Take 2 tablets (1,000 mg total) by mouth every 6 (six) hours as needed for pain.   30 tablet   0   . folic acid (FOLVITE) 1 MG tablet   Oral   Take 1 tablet (1 mg total) by mouth daily. For low folic acid         . gabapentin (NEURONTIN) 100 MG capsule   Oral   Take 1 capsule (100 mg total) by mouth 3 (three) times daily. For anxiety   90 capsule   0   . hydrOXYzine (ATARAX/VISTARIL) 50 MG tablet   Oral   Take 1 tablet (50 mg total) by mouth every 6 (six) hours as needed for anxiety.   60 tablet   0   . lurasidone (LATUDA) 40 MG TABS tablet   Oral   Take 1 tablet (40 mg total) by mouth daily with breakfast. For mood control   30 tablet   0   . Multiple Vitamin (MULTIVITAMIN WITH MINERALS) TABS tablet   Oral   Take 1 tablet by mouth daily. For low vitamin         . testosterone (  ANDROGEL) 50 MG/5GM GEL   Transdermal   Place 7.5 g onto the skin daily. For low testosterone   5 Tube   0   . traZODone (DESYREL) 100 MG tablet   Oral   Take 1 tablet (100 mg total) by mouth at bedtime and may repeat dose one time if needed. For sleep   60 tablet   0    BP 130/69  Pulse 81  Temp(Src) 98.3 F (36.8 C) (Oral)  Resp 27  Ht 6\' 1"  (1.854 m)  Wt 240 lb (108.863 kg)  BMI 31.67 kg/m2  SpO2 99% Physical Exam  Constitutional: He is oriented to person, place, and time. He appears well-developed and well-nourished.  HENT:  Head: Normocephalic and atraumatic.  Mouth/Throat: Oropharynx is clear and moist. No oropharyngeal exudate.  No swelling of the lips tongue or uvula  Eyes: Conjunctivae are normal. Pupils are equal, round, and reactive to light.  Neck: Normal range of motion. Neck supple.  Cardiovascular: Normal  rate and regular rhythm.   Pulmonary/Chest: Effort normal and breath sounds normal. No stridor. He has no wheezes. He has no rales.  Abdominal: Soft. Bowel sounds are normal. There is no tenderness. There is no rebound and no guarding.  Musculoskeletal: Normal range of motion. He exhibits edema.  Neurological: He is alert and oriented to person, place, and time.  Skin: Skin is warm and dry. He is not diaphoretic.  Psychiatric: His speech is rapid and/or pressured. He expresses impulsivity.    ED Course  Procedures (including critical care time) Labs Review Labs Reviewed  CBC WITH DIFFERENTIAL - Abnormal; Notable for the following:    HCT 38.9 (*)    Platelets 440 (*)    Monocytes Relative 14 (*)    Monocytes Absolute 1.4 (*)    All other components within normal limits  COMPREHENSIVE METABOLIC PANEL - Abnormal; Notable for the following:    Glucose, Bld 158 (*)    GFR calc non Af Amer 89 (*)    All other components within normal limits  SALICYLATE LEVEL - Abnormal; Notable for the following:    Salicylate Lvl <2.0 (*)    All other components within normal limits  ETHANOL  ACETAMINOPHEN LEVEL  URINE RAPID DRUG SCREEN (HOSP PERFORMED)   Imaging Review No results found.  MDM  No diagnosis found.  Date: 11/02/2012  Rate: 88  Rhythm: normal sinus rhythm  QRS Axis: normal  Intervals: normal  ST/T Wave abnormalities: normal  Conduction Disutrbances: none  Narrative Interpretation: unremarkable  Following poison control guidelines, suspect co ingestion of bath salts or other drug due to facial grimacing and verbal ticks  MDM Reviewed: previous chart, nursing note and vitals Reviewed previous: ECG and labs Interpretation: labs and ECG Consults: admitting MD (poison control)   Results for orders placed during the hospital encounter of 11/02/12  CBC WITH DIFFERENTIAL      Result Value Range   WBC 9.8  4.0 - 10.5 K/uL   RBC 4.31  4.22 - 5.81 MIL/uL   Hemoglobin 13.5   13.0 - 17.0 g/dL   HCT 16.1 (*) 09.6 - 04.5 %   MCV 90.3  78.0 - 100.0 fL   MCH 31.3  26.0 - 34.0 pg   MCHC 34.7  30.0 - 36.0 g/dL   RDW 40.9  81.1 - 91.4 %   Platelets 440 (*) 150 - 400 K/uL   Neutrophils Relative % 66  43 - 77 %   Neutro Abs 6.4  1.7 -  7.7 K/uL   Lymphocytes Relative 18  12 - 46 %   Lymphs Abs 1.8  0.7 - 4.0 K/uL   Monocytes Relative 14 (*) 3 - 12 %   Monocytes Absolute 1.4 (*) 0.1 - 1.0 K/uL   Eosinophils Relative 1  0 - 5 %   Eosinophils Absolute 0.1  0.0 - 0.7 K/uL   Basophils Relative 0  0 - 1 %   Basophils Absolute 0.0  0.0 - 0.1 K/uL  COMPREHENSIVE METABOLIC PANEL      Result Value Range   Sodium 137  135 - 145 mEq/L   Potassium 3.7  3.5 - 5.1 mEq/L   Chloride 98  96 - 112 mEq/L   CO2 26  19 - 32 mEq/L   Glucose, Bld 158 (*) 70 - 99 mg/dL   BUN 13  6 - 23 mg/dL   Creatinine, Ser 4.09  0.50 - 1.35 mg/dL   Calcium 9.5  8.4 - 81.1 mg/dL   Total Protein 7.1  6.0 - 8.3 g/dL   Albumin 3.8  3.5 - 5.2 g/dL   AST 29  0 - 37 U/L   ALT 19  0 - 53 U/L   Alkaline Phosphatase 59  39 - 117 U/L   Total Bilirubin 0.8  0.3 - 1.2 mg/dL   GFR calc non Af Amer 89 (*) >90 mL/min   GFR calc Af Amer >90  >90 mL/min  ETHANOL      Result Value Range   Alcohol, Ethyl (B) <11  0 - 11 mg/dL  URINE RAPID DRUG SCREEN (HOSP PERFORMED)      Result Value Range   Opiates NONE DETECTED  NONE DETECTED   Cocaine NONE DETECTED  NONE DETECTED   Benzodiazepines POSITIVE (*) NONE DETECTED   Amphetamines POSITIVE (*) NONE DETECTED   Tetrahydrocannabinol NONE DETECTED  NONE DETECTED   Barbiturates NONE DETECTED  NONE DETECTED  ACETAMINOPHEN LEVEL      Result Value Range   Acetaminophen (Tylenol), Serum <15.0  10 - 30 ug/mL  SALICYLATE LEVEL      Result Value Range   Salicylate Lvl <2.0 (*) 2.8 - 20.0 mg/dL   No results found.   Medications  0.9 %  sodium chloride infusion (not administered)  sodium chloride 0.9 % bolus 1,000 mL (not administered)  diphenhydrAMINE (BENADRYL)  injection 25 mg (25 mg Intravenous Given 11/02/12 0038)  0.9 %  sodium chloride infusion ( Intravenous New Bag/Given 11/02/12 0038)  LORazepam (ATIVAN) injection 0.5 mg (0.5 mg Intravenous Given 11/02/12 0148)   CRITICAL CARE Performed by: Jasmine Awe Total critical care time: 60 minutes Critical care time was exclusive of separately billable procedures and treating other patients. Critical care was necessary to treat or prevent imminent or life-threatening deterioration. Critical care was time spent personally by me on the following activities: development of treatment plan with patient and/or surrogate as well as nursing, discussions with consultants, evaluation of patient's response to treatment, examination of patient, obtaining history from patient or surrogate, ordering and performing treatments and interventions, ordering and review of laboratory studies, ordering and review of radiographic studies, pulse oximetry and re-evaluation of patient's condition.        Jasmine Awe, MD 11/02/12 [phone]

## 2012-11-02 NOTE — ED Notes (Signed)
tct CJ at [facility], he is currently on telephone with toxicologist. Informed he return return call immediately ending call

## 2012-11-02 NOTE — Progress Notes (Signed)
Ate 100 % of breakfast ( clear liquid diet) .no changes in lung sounds or other signs of aspiration .

## 2012-11-02 NOTE — Progress Notes (Signed)
Dr at bedside . Pt c/o  " all over" pain , but could not describe the pain . The patient denied nausea and c/o hunger.The patient unable to protrude tongue through lips but did touch roof of mouth w/tip of tongue .UE's bilat weak pushes,grips and pulls yet good fine motor skills .LE's bilat strong w/ bilat hyper- reactive Babinski reflexes .Pt making odd  and sudden  upward , single jerk movement w/head and torso .Speech is mostly clear w/occ and very brief  uncoordinated movement of tongue( as though eating peanut butter)See the written copy of this report in the patient's paper medical record.  These results did not interface directly into the electronic medical record and are summarized here. Also assessment flow sheet .

## 2012-11-02 NOTE — ED Notes (Signed)
tct to poison control, informed that patient took 20 mg of adderal that was not his prescription. Informed of patient's vs and current s/s. Recommended dosage of benadryl q 6 hr's for 24-48 hrs. Per poison control pt can be discharged once symptoms are resolved, however pt is to continue to take benadryl for 48 hrs.

## 2012-11-02 NOTE — Consult Note (Signed)
[facility] Face-to-Face Psychiatry Consult   Reason for Consult:  Overdose on Adderall Referring Physician:  Dr. Rosemarie Beath is an 47 y.o. male.  Assessment: AXIS I:  Bipolar, Depressed AXIS II:  Deferred AXIS III:   Past Medical History  Diagnosis Date  . Drug abuse   . Kidney failure   . Bipolar 1 disorder   . Rhabdomyolysis   . Acute renal failure   . Opiate abuse, continuous   . Dental caries     extensive  . Anxiety   . Depression   . Bipolar affective disorder 03/17/2012  . Mood disorder 03/16/2012   AXIS IV:  other psychosocial or environmental problems and problems related to social environment AXIS V:  1-10 persistent dangerousness to self and others present  Plan:  Recommend psychiatric Inpatient admission when medically cleared.  Subjective:   Brandon [name] is a 47 y.o. male patient admitted with overdose.  HPI:  Patient is a 47 year old Caucasian man who was admitted on the medical floor after taking the overdose on his Adderall tablet.  Patient admitted taking more than 20 tablets of Adderall in order to get high.  He also endorsed that he was feeling very depressed and knew that it may kill him.  His UDS is positive for benzodiazepine and amphetamines.  [facility] on [facility].  He was admitted to [facility] after having a suicidal attempt on his medication.  Patient told that he is tired of living his life.  Patient admitted multiple hospitalization and having drug problem.  He endorsed that he used to take Geodon by psychiatrist in [facility] however has not been on these medication for a while.  He acknowledged that he needs some helpan if he does not get help he may again try to overdose and kill himself.  Patient appears very tired and sedated however he was cooperative.  Patient denies any hallucination or any paranoia.  He's been diagnosed in the past bipolar disorder.  Patient was [facility] from  [facility] on hydroxyzine, gabapentin, trazodone and Latuda.  It is unclear if patient is taking these medication. HPI Elements:   Location:  Medical floor. Quality:  poor . Severity:  Unable to function. Timing:  in 24 hours. Duration:  Ongoing. Context:  Drug use.  Past Psychiatric History: Past Medical History  Diagnosis Date  . Drug abuse   . Kidney failure   . Bipolar 1 disorder   . Rhabdomyolysis   . Acute renal failure   . Opiate abuse, continuous   . Dental caries     extensive  . Anxiety   . Depression   . Bipolar affective disorder 03/17/2012  . Mood disorder 03/16/2012    reports that he has never smoked. He has never used smokeless tobacco. He reports that he uses illicit drugs (Oxycodone, Amphetamines, Barbituates, Cocaine, and Methamphetamines). He reports that he does not drink alcohol. Family History  Problem Relation Age of Onset  . Heart disease Mother   . Heart disease Father            Allergies:   Allergies  Allergen Reactions  . Bean Pod Extract     [place], kidney and pork&beans    ACT Assessment Complete:  No:   Past Psychiatric History: Diagnosis:  Bipolar disorder, opiate use, substance abuse  Hospitalizations:  He has multiple hospitalization  [facility]  Substance Abuse  Care:  In the past  Self-Mutilation:  yes  Suicidal Attempts:  yes  Homicidal Behaviors:  unknown   Violent Behaviors:  unknown   Place of Residence:  Lives with his father Marital Status:  unknown Employed/Unemployed:  unknown Education:  unknown Family Supports:  father Objective: Blood pressure 129/75, pulse 66, temperature 98.3 F (36.8 C), temperature source Axillary, resp. rate 19, height 6\' 3"  (1.905 m), weight 215 lb 6.2 oz (97.7 kg), SpO2 98.00%.Body mass index is 26.92 kg/(m^2). Results for orders placed during the hospital encounter of 11/02/12 (from the past 72 hour(s))  CBC WITH DIFFERENTIAL      Status: Abnormal   Collection Time    11/02/12 12:26 AM      Result Value Range   WBC 9.8  4.0 - 10.5 K/uL   RBC 4.31  4.22 - 5.81 MIL/uL   Hemoglobin 13.5  13.0 - 17.0 g/dL   HCT 16.1 (*) 09.6 - 04.5 %   MCV 90.3  78.0 - 100.0 fL   MCH 31.3  26.0 - 34.0 pg   MCHC 34.7  30.0 - 36.0 g/dL   RDW 40.9  81.1 - 91.4 %   Platelets 440 (*) 150 - 400 K/uL   Neutrophils Relative % 66  43 - 77 %   Neutro Abs 6.4  1.7 - 7.7 K/uL   Lymphocytes Relative 18  12 - 46 %   Lymphs Abs 1.8  0.7 - 4.0 K/uL   Monocytes Relative 14 (*) 3 - 12 %   Monocytes Absolute 1.4 (*) 0.1 - 1.0 K/uL   Eosinophils Relative 1  0 - 5 %   Eosinophils Absolute 0.1  0.0 - 0.7 K/uL   Basophils Relative 0  0 - 1 %   Basophils Absolute 0.0  0.0 - 0.1 K/uL  COMPREHENSIVE METABOLIC PANEL     Status: Abnormal   Collection Time    11/02/12 12:26 AM      Result Value Range   Sodium 137  135 - 145 mEq/L   Potassium 3.7  3.5 - 5.1 mEq/L   Chloride 98  96 - 112 mEq/L   CO2 26  19 - 32 mEq/L   Glucose, Bld 158 (*) 70 - 99 mg/dL   BUN 13  6 - 23 mg/dL   Creatinine, Ser 7.82  0.50 - 1.35 mg/dL   Calcium 9.5  8.4 - 95.6 mg/dL   Total Protein 7.1  6.0 - 8.3 g/dL   Albumin 3.8  3.5 - 5.2 g/dL   AST 29  0 - 37 U/L   ALT 19  0 - 53 U/L   Alkaline Phosphatase 59  39 - 117 U/L   Total Bilirubin 0.8  0.3 - 1.2 mg/dL   GFR calc non Af Amer 89 (*) >90 mL/min   GFR calc Af Amer >90  >90 mL/min   Comment: (NOTE)     The eGFR has been calculated using the CKD EPI equation.     This calculation has not been validated in all clinical situations.     eGFR's persistently <90 mL/min signify possible Chronic Kidney     Disease.  ETHANOL     Status: None   Collection Time    11/02/12 12:26 AM      Result Value Range   Alcohol, Ethyl (B) <11  0 - 11 mg/dL   Comment:            LOWEST DETECTABLE LIMIT FOR     SERUM  ALCOHOL IS 11 mg/dL     FOR MEDICAL PURPOSES ONLY  ACETAMINOPHEN LEVEL     Status: None   Collection Time    11/02/12  12:26 AM      Result Value Range   Acetaminophen (Tylenol), Serum <15.0  10 - 30 ug/mL   Comment:            THERAPEUTIC CONCENTRATIONS VARY     SIGNIFICANTLY. A RANGE OF 10-30     ug/mL MAY BE AN EFFECTIVE     CONCENTRATION FOR MANY PATIENTS.     HOWEVER, SOME ARE BEST TREATED     AT CONCENTRATIONS OUTSIDE THIS     RANGE.     ACETAMINOPHEN CONCENTRATIONS     >150 ug/mL AT 4 HOURS AFTER     INGESTION AND >50 ug/mL AT 12     HOURS AFTER INGESTION ARE     OFTEN ASSOCIATED WITH TOXIC     REACTIONS.  SALICYLATE LEVEL     Status: Abnormal   Collection Time    11/02/12 12:26 AM      Result Value Range   Salicylate Lvl <2.0 (*) 2.8 - 20.0 mg/dL  URINE RAPID DRUG SCREEN (HOSP PERFORMED)     Status: Abnormal   Collection Time    11/02/12  1:05 AM      Result Value Range   Opiates NONE DETECTED  NONE DETECTED   Cocaine NONE DETECTED  NONE DETECTED   Benzodiazepines POSITIVE (*) NONE DETECTED   Amphetamines POSITIVE (*) NONE DETECTED   Tetrahydrocannabinol NONE DETECTED  NONE DETECTED   Barbiturates NONE DETECTED  NONE DETECTED   Comment:            DRUG SCREEN FOR MEDICAL PURPOSES     ONLY.  IF CONFIRMATION IS NEEDED     FOR ANY PURPOSE, NOTIFY LAB     WITHIN 5 DAYS.                LOWEST DETECTABLE LIMITS     FOR URINE DRUG SCREEN     Drug Class       Cutoff (ng/mL)     Amphetamine      1000     Barbiturate      200     Benzodiazepine   200     Tricyclics       300     Opiates          300     Cocaine          300     THC              50  MRSA PCR SCREENING     Status: None   Collection Time    11/02/12  3:45 AM      Result Value Range   MRSA by PCR NEGATIVE  NEGATIVE   Comment:            The GeneXpert MRSA Assay (FDA     approved for NASAL specimens     only), is one component of a     comprehensive MRSA colonization     surveillance program. It is not     intended to diagnose MRSA     infection nor to guide or     monitor treatment for     MRSA infections.   BASIC METABOLIC PANEL     Status: None   Collection Time    11/02/12  5:59 AM  Result Value Range   Sodium 138  135 - 145 mEq/L   Potassium 3.7  3.5 - 5.1 mEq/L   Chloride 103  96 - 112 mEq/L   CO2 23  19 - 32 mEq/L   Glucose, Bld 97  70 - 99 mg/dL   BUN 11  6 - 23 mg/dL   Creatinine, Ser 1.61  0.50 - 1.35 mg/dL   Calcium 8.7  8.4 - 09.6 mg/dL   GFR calc non Af Amer >90  >90 mL/min   GFR calc Af Amer >90  >90 mL/min   Comment: (NOTE)     The eGFR has been calculated using the CKD EPI equation.     This calculation has not been validated in all clinical situations.     eGFR's persistently <90 mL/min signify possible Chronic Kidney     Disease.  CBC     Status: Abnormal   Collection Time    11/02/12  5:59 AM      Result Value Range   WBC 6.6  4.0 - 10.5 K/uL   RBC 4.02 (*) 4.22 - 5.81 MIL/uL   Hemoglobin 12.7 (*) 13.0 - 17.0 g/dL   HCT 04.5 (*) 40.9 - 81.1 %   MCV 89.6  78.0 - 100.0 fL   MCH 31.6  26.0 - 34.0 pg   MCHC 35.3  30.0 - 36.0 g/dL   RDW 91.4  78.2 - 95.6 %   Platelets 360  150 - 400 K/uL   Labs are reviewed and are pertinent for amphetamine, benzodiazepine.  Current Facility-Administered Medications  Medication Dose Route Frequency Provider Last Rate Last Dose  . 0.9 %  sodium chloride infusion   Intravenous Continuous Lonia Blood, MD 75 mL/hr at 11/02/12 0900    . acetaminophen (TYLENOL) tablet 650 mg  650 mg Oral Q6H PRN Lonia Blood, MD      . cloNIDine (CATAPRES) tablet 0.1 mg  0.1 mg Oral TID Lonia Blood, MD   0.1 mg at 11/02/12 0949  . enoxaparin (LOVENOX) injection 40 mg  40 mg Subcutaneous Q24H Ron Parker, MD   40 mg at 11/02/12 0949  . hydrOXYzine (ATARAX/VISTARIL) tablet 50 mg  50 mg Oral Q6H PRN Lonia Blood, MD      . LORazepam (ATIVAN) injection 1 mg  1 mg Intravenous Q4H PRN Lonia Blood, MD   1 mg at 11/02/12 1352  . magic mouthwash  15 mL Oral QID PRN Ron Parker, MD      . ondansetron (ZOFRAN) tablet  4 mg  4 mg Oral Q6H PRN Ron Parker, MD       Or  . ondansetron (ZOFRAN) injection 4 mg  4 mg Intravenous Q6H PRN Ron Parker, MD      . traMADol Janean Sark) tablet 50 mg  50 mg Oral Q6H PRN Lonia Blood, MD   50 mg at 11/02/12 0932    Psychiatric Specialty Exam:     Blood pressure 129/75, pulse 66, temperature 98.3 F (36.8 C), temperature source Axillary, resp. rate 19, height 6\' 3"  (1.905 m), weight 215 lb 6.2 oz (97.7 kg), SpO2 98.00%.Body mass index is 26.92 kg/(m^2).  General Appearance: Sedated and wearing hospital gown  Eye Contact::  Fair  Speech:  Slow  Volume:  Decreased  Mood:  Depressed, Dysphoric and Hopeless  Affect:  Constricted and Depressed  Thought Process:  Loose  Orientation:  Full (Time, Place, and Person)  Thought Content:  Rumination  Suicidal Thoughts:  Yes.  with intent/plan  Homicidal Thoughts:  No  Memory:  NA  Judgement:  Poor  Insight:  Lacking  Psychomotor Activity:  Decreased  Concentration:  Poor  Recall:  Fair  Akathisia:  No  Handed:  Right  AIMS (if indicated):     Assets:  Desire for Improvement Housing  Sleep:      Treatment Plan Summary: Patient reports inpatient psychiatric treatment.  Once medically cleared transfer to [facility] for [facility].    ARFEEN,SYED T. 11/02/2012 4:18 PM

## 2012-11-02 NOTE — ED Notes (Signed)
Returned to [employer] [facility], pt now states that he has taken 24 20 mg tablets, not just one tablet, poison control to return call

## 2012-11-02 NOTE — ED Notes (Signed)
tcf CJ with poison control,unusual to have specific reaction patient is having on adderrall unless pt has mixed with another drug, per poison control recommended treatment is benzodiazepine or haldol. Md aware

## 2012-11-02 NOTE — Progress Notes (Signed)
[facility] Progress Note [place] TEAM 1 - Stepdown/ICU TEAM   Brandon [name] FAO:[id number] DOB: 1965-07-29 DOA: 11/02/2012 PCP: No primary provider on file.  Admit HPI / Brief Narrative: 47 y.o. male who presented to the ED after taking an overdose of Adderall tablets. He reported taking 24+ 20 mg Adderall tablets in order to get high. He denied having any suicidal ideation. He felt pain and swelling in his tongue and thought that he may have been suffering from an allergic reaction so he told his father who had him sent to the ED at [facility]. He was evslauted there and his UDS was positive for Benzodiazepines and Amphetamines, and Poison Control was contacted and recommended cardiovascular monitoring for 24 to 48 hours.   Assessment/Plan:  OD on Rx amphetamine Was tx for suicidal ideation 10/14/2012 after OD on narcotics, cocaine, and geodan - denies SI this episode, but I am not convinced he is being honest - I have consulted Psych for an evaluation   Polysubstance abuse narcotics + cocaine + amphetamines - reports that he is interested in rehab option - CSW consulted   Bipolar D/O Med tx at direction of Psych   Code Status: FULL Family Communication: no family present at time of exam Disposition Plan: SDU  Consultants: Psychiatry  Procedures: none  Antibiotics: none  DVT prophylaxis: lovenox  HPI/Subjective: F/U visit completed  Objective: Blood pressure 131/72, pulse 73, temperature 98.7 F (37.1 C), temperature source Oral, resp. rate 24, height 6\' 3"  (1.905 m), weight 97.7 kg (215 lb 6.2 oz), SpO2 99.00%.  Intake/Output Summary (Last 24 hours) at 11/02/12 0758 Last data filed at 11/02/12 0700  Gross per 24 hour  Intake    300 ml  Output      0 ml  Net    300 ml   Exam: F/U exam completed  Data Reviewed: Basic Metabolic Panel:  Recent Labs Lab 11/02/12 0026 11/02/12 0559  NA 137 138  K 3.7 3.7  CL 98 103  CO2 26 23  GLUCOSE 158* 97  BUN 13 11   CREATININE 1.00 0.96  CALCIUM 9.5 8.7   Liver Function Tests:  Recent Labs Lab 11/02/12 0026  AST 29  ALT 19  ALKPHOS 59  BILITOT 0.8  PROT 7.1  ALBUMIN 3.8   CBC:  Recent Labs Lab 11/02/12 0026 11/02/12 0559  WBC 9.8 6.6  NEUTROABS 6.4  --   HGB 13.5 12.7*  HCT 38.9* 36.0*  MCV 90.3 89.6  PLT 440* 360   BNP (last 3 results)  Recent Labs  05/25/12 1825  PROBNP 43.1    Recent Results (from the past 240 hour(s))  MRSA PCR SCREENING     Status: None   Collection Time    11/02/12  3:45 AM      Result Value Range Status   MRSA by PCR NEGATIVE  NEGATIVE Final   Comment:            The GeneXpert MRSA Assay (FDA     approved for NASAL specimens     only), is one component of a     comprehensive MRSA colonization     surveillance program. It is not     intended to diagnose MRSA     infection nor to guide or     monitor treatment for     MRSA infections.     Studies:  Recent x-ray studies have been reviewed in detail by the Attending Physician  Scheduled Meds:  Scheduled Meds: . sodium chloride   Intravenous Once  . cloNIDine  0.1 mg Oral BID  . enoxaparin (LOVENOX) injection  40 mg Subcutaneous Q24H  . pantoprazole (PROTONIX) IV  40 mg Intravenous Q24H    Time spent on care of this patient: 25+ mins   [facility] T  [facility] Office  [phone] Pager - Text Page per Loretha Stapler as per below:  On-Call/Text Page:      [email or web address]      password [email or web address]  If 7PM-7AM, please contact night-coverage [email or web address] Password [email or web address] 11/02/2012, 7:58 AM   LOS: 0 days

## 2012-11-02 NOTE — Progress Notes (Signed)
Notified Md about pt having withdrawal symptoms. Very anxious and jerking at times.  Pt history of suicidal attempts and overdose.   Md will hold off on placing on Suicidal precautions, but will place on CIWA.  Will continue to monitor pt closely. Brandon [name]

## 2012-11-03 MED ORDER — ALUM & MAG HYDROXIDE-SIMETH 200-200-20 MG/5ML PO SUSP: 30.0000 mL | ORAL | Status: DC | PRN

## 2012-11-03 MED ORDER — BENZTROPINE MESYLATE 1 MG/ML IJ SOLN
1.0000 mg | Freq: Two times a day (BID) | INTRAMUSCULAR | Status: DC
  Administered 2012-11-03 – 2012-11-04 (×3): 1 mg via INTRAVENOUS
  Filled 2012-11-03 (×5): qty 1

## 2012-11-03 MED ORDER — LORAZEPAM 2 MG/ML IJ SOLN
1.0000 mg | INTRAMUSCULAR | Status: DC | PRN
  Administered 2012-11-03 (×2): 1 mg via INTRAVENOUS
  Administered 2012-11-03 – 2012-11-04 (×3): 2 mg via INTRAVENOUS
  Administered 2012-11-04: 1 mg via INTRAVENOUS
  Administered 2012-11-04: 2 mg via INTRAVENOUS
  Filled 2012-11-03 (×7): qty 1

## 2012-11-03 MED ORDER — METHYLPREDNISOLONE SODIUM SUCC 125 MG IJ SOLR
80.0000 mg | Freq: Once | INTRAMUSCULAR | Status: AC
  Administered 2012-11-03: 80 mg via INTRAVENOUS
  Filled 2012-11-03: qty 2

## 2012-11-03 MED ORDER — PANTOPRAZOLE SODIUM 40 MG PO TBEC
40.0000 mg | DELAYED_RELEASE_TABLET | Freq: Every day | ORAL | Status: DC
  Administered 2012-11-03 – 2012-11-05 (×3): 40 mg via ORAL
  Filled 2012-11-03 (×3): qty 1

## 2012-11-03 NOTE — Plan of Care (Signed)
Problem: Consults Goal: General Medical Patient Education See Patient Education Module for specific education. Outcome: Progressing Ativan ordered,on CIWA PROTOCOL Goal: Nutrition Consult-if indicated Outcome: Progressing EATING > 50% OF MEALS  Problem: Phase I Progression Outcomes Goal: Pain controlled with appropriate interventions Outcome: Progressing H/A AND GENERALIZED PAIN CONTROLLED W/ATIVAN Goal: OOB as tolerated unless otherwise ordered Outcome: Progressing TOL OOB TO BSC W/ MORE STABLE GAIT

## 2012-11-03 NOTE — Progress Notes (Signed)
[facility] Progress Note [place] TEAM 1 - Stepdown/ICU TEAM   Brandon [name] GNF:[id number] DOB: 1965-03-13 DOA: 11/02/2012 PCP: No primary provider on file.  Admit HPI / Brief Narrative: 47 y.o. male who presented to the ED after taking an overdose of Adderall tablets. He reported taking 24+ 20 mg Adderall tablets in order to get high. He denied having any suicidal ideation, but did admit that he knew it could kill him. He felt pain and swelling in his tongue and thought that he may have been suffering from an allergic reaction so he told his father who had him sent to the ED at [facility]. He was evslauted there and his UDS was positive for Benzodiazepines and Amphetamines, and Poison Control was contacted and recommended cardiovascular monitoring for 24 to 48 hours.   Assessment/Plan:  OD on Rx amphetamine Was tx for suicidal ideation 10/14/2012 after OD on narcotics, cocaine, and geodan - denies SI this episode, but has admitted to feeling hopeless and knowing his actions could have ended his life - Psych has recommended inpt tx and I agree - at present pt appears to still be suffering with the affects of his amphetamine OD - no hyperthermia or tachycardia at this time   Dystonic reaction vs/ Tardive dyskinesia? I will avoid antipsychotics out of concern that patient's constant mouth movement may represent a dystonic reaction or even TD - will give trial of benztropine and follow   Polysubstance abuse narcotics + cocaine + amphetamines - reports that he is interested in rehab option - CSW consulted - plan is to transfer to [facility] when cleared medically   Bipolar D/O Med tx at direction of Psych   Code Status: FULL Family Communication: no family present at time of exam Disposition Plan: SDU  Consultants: Psychiatry  Procedures: none  Antibiotics: none  DVT prophylaxis: lovenox  HPI/Subjective: Pt c/o mouth/tongue pain.  Denies trouble swallowing.  Denies sob, wheeze, cp,  n/v, or abdom pain.  Does admit to agitation and restlessness. Moves mouth in circular pattern almost like a dystonic reaction vs TD.  Objective: Blood pressure 109/74, pulse 55, temperature 98.4 F (36.9 C), temperature source Axillary, resp. rate 18, height 6\' 3"  (1.905 m), weight 97.7 kg (215 lb 6.2 oz), SpO2 99.00%.  Intake/Output Summary (Last 24 hours) at 11/03/12 0831 Last data filed at 11/03/12 0600  Gross per 24 hour  Intake   3330 ml  Output   1778 ml  Net   1552 ml   Exam: General: No acute respiratory distress Lungs: Clear to auscultation bilaterally without wheezes or crackles Cardiovascular: Regular rate and rhythm without murmur gallop or rub normal S1 and S2 Abdomen: Nontender, nondistended, soft, bowel sounds positive, no rebound, no ascites, no appreciable mass Extremities: No significant cyanosis, clubbing, or edema bilateral lower extremities HEENT:  Moves jaw in almost circular motion constantly   Data Reviewed: Basic Metabolic Panel:  Recent Labs Lab 11/02/12 0026 11/02/12 0559  NA 137 138  K 3.7 3.7  CL 98 103  CO2 26 23  GLUCOSE 158* 97  BUN 13 11  CREATININE 1.00 0.96  CALCIUM 9.5 8.7   Liver Function Tests:  Recent Labs Lab 11/02/12 0026  AST 29  ALT 19  ALKPHOS 59  BILITOT 0.8  PROT 7.1  ALBUMIN 3.8   CBC:  Recent Labs Lab 11/02/12 0026 11/02/12 0559  WBC 9.8 6.6  NEUTROABS 6.4  --   HGB 13.5 12.7*  HCT 38.9* 36.0*  MCV 90.3 89.6  PLT 440* 360   BNP (last 3 results)  Recent Labs  05/25/12 1825  PROBNP 43.1    Recent Results (from the past 240 hour(s))  MRSA PCR SCREENING     Status: None   Collection Time    11/02/12  3:45 AM      Result Value Range Status   MRSA by PCR NEGATIVE  NEGATIVE Final   Comment:            The GeneXpert MRSA Assay (FDA     approved for NASAL specimens     only), is one component of a     comprehensive MRSA colonization     surveillance program. It is not     intended to diagnose  MRSA     infection nor to guide or     monitor treatment for     MRSA infections.     Studies:  Recent x-ray studies have been reviewed in detail by the Attending Physician  Scheduled Meds:  Scheduled Meds: . cloNIDine  0.1 mg Oral TID  . enoxaparin (LOVENOX) injection  40 mg Subcutaneous Q24H  . LORazepam  0-4 mg Intravenous Q6H   Followed by  . [START ON 11/04/2012] LORazepam  0-4 mg Intravenous Q12H    Time spent on care of this patient: 35 mins   [facility] T  [facility] Office  [phone] Pager - Text Page per Loretha Stapler as per below:  On-Call/Text Page:      [email or web address]      password [email or web address]  If 7PM-7AM, please contact night-coverage [email or web address] Password [email or web address] 11/03/2012, 8:31 AM   LOS: 1 day

## 2012-11-04 LAB — CK: Total CK: 76 U/L (ref 7–232)

## 2012-11-04 MED ORDER — CLONIDINE HCL 0.1 MG PO TABS
0.1000 mg | ORAL_TABLET | Freq: Two times a day (BID) | ORAL | Status: DC
Start: 2012-11-04 — End: 2012-11-05
  Filled 2012-11-04 (×3): qty 1

## 2012-11-04 MED ORDER — ZOLPIDEM TARTRATE 5 MG PO TABS: 10.0000 mg | ORAL_TABLET | Freq: Every evening | ORAL | Status: DC | PRN

## 2012-11-04 MED ORDER — LORAZEPAM 2 MG/ML IJ SOLN
1.0000 mg | INTRAMUSCULAR | Status: DC | PRN
  Administered 2012-11-04: 1 mg via INTRAVENOUS
  Administered 2012-11-05: 2 mg via INTRAVENOUS
  Administered 2012-11-05: 1 mg via INTRAVENOUS
  Administered 2012-11-05: 2 mg via INTRAVENOUS
  Filled 2012-11-04 (×4): qty 1

## 2012-11-04 MED ORDER — BENZTROPINE MESYLATE 1 MG PO TABS
1.0000 mg | ORAL_TABLET | Freq: Two times a day (BID) | ORAL | Status: DC
  Administered 2012-11-04 – 2012-11-05 (×2): 1 mg via ORAL
  Filled 2012-11-04 (×4): qty 1

## 2012-11-04 NOTE — Progress Notes (Signed)
Utilization review completed.  P.J. Atharva Mirsky,RN,BSN Case Manager [phone]  

## 2012-11-04 NOTE — Progress Notes (Signed)
Nutrition Brief Note  Patient identified on the Malnutrition Screening Tool (MST) Report  Wt Readings from Last 15 Encounters:  11/02/12 215 lb 6.2 oz (97.7 kg)  10/16/12 233 lb (105.688 kg)  10/14/12 223 lb 5.2 oz (101.3 kg)  05/30/12 236 lb (107.049 kg)  05/26/12 241 lb 2.9 oz (109.4 kg)  03/16/12 164 lb (74.39 kg)  03/12/12 250 lb (113.399 kg)  06/26/11 250 lb (113.399 kg)    Body mass index is 26.92 kg/(m^2). Patient meets criteria for overweight based on current BMI.   Current diet order is Regular, patient is consuming approximately 100% of meals at this time. Labs and medications reviewed.   RD met with pt who reports his appetite has been "so-so" but his wt has been stable.  Discussed his admission wt of 215 lbs which pt confirms is his usual.  Chart reviews shows wt variability.  Pt consumed 100% of breakfast this AM.  Planning to d/c once medically stable to [facility]. No nutrition interventions warranted at this time. If nutrition issues arise, please consult RD.   Loyce Dys, MS RD LDN Clinical Inpatient Dietitian Pager: [phone] Weekend/After hours pager: [phone]

## 2012-11-04 NOTE — Progress Notes (Signed)
[facility] Progress Note [place] TEAM 1 - Stepdown/ICU TEAM   Brandon [name]:[id number] DOB: [birth year] DOA: 11/02/2012 PCP: No primary provider on file.  Admit HPI / Brief Narrative: 47 y.o. male who presented to the ED after taking an overdose of Adderall tablets. He reported taking 24+ 20 mg Adderall tablets in order to get high. He denied having any suicidal ideation, but did admit that he knew it could kill him. He felt pain and swelling in his tongue and thought that he may have been suffering from an allergic reaction so he told his father who had him sent to the ED at [facility]. He was evslauted there and his UDS was positive for Benzodiazepines and Amphetamines, and Poison Control was contacted and recommended cardiovascular monitoring for 24 to 48 hours.   Assessment/Plan:  OD on Rx amphetamine Was tx for suicidal ideation 10/14/2012 after OD on narcotics, cocaine, and geodan - denies SI this episode, but has admitted to feeling hopeless and knowing his actions could have ended his life - Psych has recommended inpt tx and I agree - pt has now cleared his OD with no persisting effects  - no hyperthermia or tachycardia at this time - spoke w/ Psych MD who clarifies that pt IS felt to be a threat to himself such that INVOLUNTARY COMMITMENT will need to be pursued should the pt refuse to allow placement in [facility] - I have asked SW to initiate the transfer process  Dystonic reaction vs/ Tardive dyskinesia? I will avoid antipsychotics out of concern that patient's constant mouth movement may represent a dystonic reaction or even TD - will give trial of benztropine while in the hospital and follow - perhaps there has been a minimal decrease in this movement after 24hrs of benztropine   Polysubstance abuse narcotics + cocaine + amphetamines - reports that he is interested in rehab option - CSW consulted - plan is to transfer to [facility] as discussed above - if pt desires long term residential tx at  a private facility, this could be pursued after he is d/c from [facility]  Bipolar D/O Med tx at direction of Psych   Code Status: FULL Family Communication: no family present at time of exam Disposition Plan: stable for transfer to med bed - suicide precautions to be followed now that status clarified w/ Psych MD - await bed at [facility] to be arranged by CSW - if pt attempts to leave AMA we will pursue involuntary commitment   Consultants: Psychiatry  Procedures: none  Antibiotics: none  DVT prophylaxis: lovenox  HPI/Subjective: Pt c/o trouble sleeping at night and requests ambien.  Denies trouble swallowing.  Denies sob, wheeze, cp, n/v, or abdom pain.  Moves mouth in circular pattern almost like a dystonic reaction vs TD, though less pronounced today.    Objective: Blood pressure 109/62, pulse 71, temperature 97.5 F (36.4 C), temperature source Axillary, resp. rate 15, height 6\' 3"  (1.905 m), weight 97.7 kg (215 lb 6.2 oz), SpO2 97.00%.  Intake/Output Summary (Last 24 hours) at 11/04/12 1313 Last data filed at 11/04/12 1200  Gross per 24 hour  Intake   2210 ml  Output   2001 ml  Net    209 ml   Exam: General: No acute respiratory distress Lungs: Clear to auscultation bilaterally without wheezes or crackles Cardiovascular: Regular rate and rhythm without murmur gallop or rub normal S1 and S2 Abdomen: Nontender, nondistended, soft, bowel sounds positive, no rebound, no ascites, no appreciable mass Extremities:  No significant cyanosis, clubbing, or edema bilateral lower extremities HEENT:  Moves jaw in almost circular motion nearly constantly   Data Reviewed: Basic Metabolic Panel:  Recent Labs Lab 11/02/12 0026 11/02/12 0559  NA 137 138  K 3.7 3.7  CL 98 103  CO2 26 23  GLUCOSE 158* 97  BUN 13 11  CREATININE 1.00 0.96  CALCIUM 9.5 8.7   Liver Function Tests:  Recent Labs Lab 11/02/12 0026  AST 29  ALT 19  ALKPHOS 59  BILITOT 0.8  PROT 7.1  ALBUMIN 3.8    CBC:  Recent Labs Lab 11/02/12 0026 11/02/12 0559  WBC 9.8 6.6  NEUTROABS 6.4  --   HGB 13.5 12.7*  HCT 38.9* 36.0*  MCV 90.3 89.6  PLT 440* 360   BNP (last 3 results)  Recent Labs  05/25/12 1825  PROBNP 43.1    Recent Results (from the past 240 hour(s))  MRSA PCR SCREENING     Status: None   Collection Time    11/02/12  3:45 AM      Result Value Range Status   MRSA by PCR NEGATIVE  NEGATIVE Final   Comment:            The GeneXpert MRSA Assay (FDA     approved for NASAL specimens     only), is one component of a     comprehensive MRSA colonization     surveillance program. It is not     intended to diagnose MRSA     infection nor to guide or     monitor treatment for     MRSA infections.     Studies:  Recent x-ray studies have been reviewed in detail by the Attending Physician  Scheduled Meds:  Scheduled Meds: . benztropine mesylate  1 mg Intravenous BID  . cloNIDine  0.1 mg Oral TID  . enoxaparin (LOVENOX) injection  40 mg Subcutaneous Q24H  . LORazepam  0-4 mg Intravenous Q6H   Followed by  . LORazepam  0-4 mg Intravenous Q12H  . pantoprazole  40 mg Oral Q1200    Time spent on care of this patient: 35 mins   [facility] T  [facility] Office  [phone] Pager - Text Page per Loretha Stapler as per below:  On-Call/Text Page:      [email or web address]      password [email or web address]  If 7PM-7AM, please contact night-coverage [email or web address] Password [email or web address] 11/04/2012, 1:13 PM   LOS: 2 days

## 2012-11-05 ENCOUNTER — Inpatient Hospital Stay (HOSPITAL_COMMUNITY)
Admission: AD | Admit: 2012-11-05 | Discharge: 2012-11-09 | DRG: 885 | Disposition: A | Discharge status: Home / Self Care | Payer: No Typology Code available for payment source | Source: Intra-hospital | Attending: Psychiatry | Admitting: Psychiatry

## 2012-11-05 DIAGNOSIS — D649 Anemia, unspecified: Secondary | ICD-10-CM

## 2012-11-05 DIAGNOSIS — G4700 Insomnia, unspecified: Secondary | ICD-10-CM | POA: Diagnosis present

## 2012-11-05 DIAGNOSIS — F1120 Opioid dependence, uncomplicated: Secondary | ICD-10-CM

## 2012-11-05 DIAGNOSIS — M62838 Other muscle spasm: Secondary | ICD-10-CM | POA: Diagnosis present

## 2012-11-05 DIAGNOSIS — R45851 Suicidal ideations: Secondary | ICD-10-CM

## 2012-11-05 DIAGNOSIS — F39 Unspecified mood [affective] disorder: Secondary | ICD-10-CM

## 2012-11-05 DIAGNOSIS — K3184 Gastroparesis: Secondary | ICD-10-CM

## 2012-11-05 DIAGNOSIS — F319 Bipolar disorder, unspecified: Secondary | ICD-10-CM | POA: Diagnosis present

## 2012-11-05 DIAGNOSIS — F1910 Other psychoactive substance abuse, uncomplicated: Secondary | ICD-10-CM

## 2012-11-05 DIAGNOSIS — F410 Panic disorder [episodic paroxysmal anxiety] without agoraphobia: Secondary | ICD-10-CM | POA: Diagnosis present

## 2012-11-05 DIAGNOSIS — F1410 Cocaine abuse, uncomplicated: Secondary | ICD-10-CM

## 2012-11-05 DIAGNOSIS — Z23 Encounter for immunization: Secondary | ICD-10-CM

## 2012-11-05 DIAGNOSIS — F411 Generalized anxiety disorder: Secondary | ICD-10-CM | POA: Diagnosis present

## 2012-11-05 DIAGNOSIS — F1920 Other psychoactive substance dependence, uncomplicated: Secondary | ICD-10-CM | POA: Diagnosis present

## 2012-11-05 DIAGNOSIS — F1994 Other psychoactive substance use, unspecified with psychoactive substance-induced mood disorder: Secondary | ICD-10-CM | POA: Diagnosis present

## 2012-11-05 DIAGNOSIS — F3130 Bipolar disorder, current episode depressed, mild or moderate severity, unspecified: Principal | ICD-10-CM | POA: Diagnosis present

## 2012-11-05 DIAGNOSIS — M25569 Pain in unspecified knee: Secondary | ICD-10-CM

## 2012-11-05 MED ORDER — MAGNESIUM HYDROXIDE 400 MG/5ML PO SUSP: 30.0000 mL | Freq: Every day | ORAL | Status: DC | PRN

## 2012-11-05 MED ORDER — INFLUENZA VAC SPLIT QUAD 0.5 ML IM SUSP
0.5000 mL | INTRAMUSCULAR | Status: AC
  Filled 2012-11-05: qty 0.5

## 2012-11-05 MED ORDER — ALUM & MAG HYDROXIDE-SIMETH 200-200-20 MG/5ML PO SUSP: 30.0000 mL | ORAL | Status: DC | PRN

## 2012-11-05 MED ORDER — ACETAMINOPHEN 325 MG PO TABS
650.0000 mg | ORAL_TABLET | Freq: Four times a day (QID) | ORAL | Status: DC | PRN
  Administered 2012-11-07 – 2012-11-08 (×2): 650 mg via ORAL

## 2012-11-05 MED ORDER — QUETIAPINE FUMARATE 50 MG PO TABS
50.0000 mg | ORAL_TABLET | Freq: Every day | ORAL | Status: DC
  Administered 2012-11-05: 50 mg via ORAL
  Filled 2012-11-05 (×3): qty 1

## 2012-11-05 NOTE — Progress Notes (Signed)
Patient ID: DAGMAWI VENABLE, male   DOB: 1965-08-19, 47 y.o.   MRN: [id number] D:  47 year old male admitted from [facility] campus.  He ingested several Adderall tablets that were given to him by "a friend."  He became concerned when he started having swelling in his tongue.  He told his father and his father encouraged him to go to the hospital.  He spent some time in ICU on a cardiac monitor and then was moved to a medical floor.  He states he was not trying to commit suicide, he only wanted to "get high."  Patient was just discharged last week from the substance abuse unit.  He was given follow up appointments which his father states he did not keep.  He was irritable with the admission process and kept saying he just wanted to go to bed.   A:  Admission process completed.  Call placed to Fransisca Kaufmann, NP for admission orders.  Explained to patient that he would get Seroquel at bedtime and would be further assessed in the morning.  R:  Irritable but able to be re-directed.  Patient was re-oriented to the unit and to the unit schedules.  He was given a dinner tray and some fluids.  He verbalized understanding of all instructions.

## 2012-11-05 NOTE — Clinical Social Work Psych Assess (Signed)
Clinical Social Work Department CLINICAL SOCIAL WORK PSYCHIATRY SERVICE LINE ASSESSMENT 11/05/2012  Patient:  Brandon [name]  Account:  [id number]  Admit Date:  11/02/2012  Clinical Social Worker:  Read Drivers  Date/Time:  11/05/2012 02:44 PM Referred by:  Physician  Date referred:  11/05/2012 Reason for Referral  Behavioral Health Issues   Presenting Symptoms/Problems (In the person's/family's own words):   Psych was consulted for possible suicide attempt via overdose; polysubstance abuse   Abuse/Neglect/Trauma History (check all that apply)  Denies history   Abuse/Neglect/Trauma Comments:   none reported or noted in chart   Psychiatric History (check all that apply)  Outpatient treatment   Psychiatric medications:  LORazepam (ATIVAN) injection 1-2 mg  Dose: 1-2 mg Freq: Every  3 hours PRN Route: IV  PRN Reasons: anxiety,seizure,sedation  Start: 11/04/12 1800   Admin Instructions:  For IV use - Dilute to 1mg /ml with NS and push 1mg /min.   Current Mental Health Hospitalizations/Previous Mental Health History:   Pt stated that he has been at [facility] in the past.  Per chart review, pt was admitted to [facility] back the first of August for same sx (overdose)   Current provider:   unknown   Place and Date:   unknown   Current Medications:   Scheduled Meds:      . benztropine  1 mg Oral BID  . cloNIDine  0.1 mg Oral BID  . enoxaparin (LOVENOX) injection  40 mg Subcutaneous Q24H  . pantoprazole  40 mg Oral Q1200        Continuous Infusions:      PRN Meds:.acetaminophen, alum & mag hydroxide-simeth, hydrOXYzine, LORazepam, magic mouthwash, ondansetron (ZOFRAN) IV, ondansetron, traMADol, zolpidem       Previous Impatient Admission/Date/Reason:   August 2014 - [facility] d/c to [facility] for possible overdose   Emotional Health / Current Symptoms    Suicide/Self Harm  None reported   Suicide attempt in the past:   Pt has had two different overdoses within the past month. Pt states  that these are attempts to get high and not suicide attempts.   Other harmful behavior:   None reported or noted in chart   Psychotic/Dissociative Symptoms  None reported   Other Psychotic/Dissociative Symptoms:   none reported or noted in chart    Attention/Behavioral Symptoms  Restless   Other Attention / Behavioral Symptoms:   none reported or noted in chart    Cognitive Impairment  Poor/Impaired Decision-Making  Poor Judgement   Other Cognitive Impairment:   no others reported in chart or reported    Mood and Adjustment  Aggressive/frustrated  Anxious    Stress, Anxiety, Trauma, Any Recent Loss/Stressor  None reported   Anxiety (frequency):   none reported in chart or noted   Phobia (specify):   none reported or noted in chart   Compulsive behavior (specify):   none reported or noted in chart   Obsessive behavior (specify):   none reported or noted in chart   Other:   none reported or noted in chart   Substance Abuse/Use  Current substance use   SBIRT completed (please refer for detailed history):  N  Self-reported substance use:   upon arrival to [facility] ED   Urinary Drug Screen Completed:  Y Alcohol level:   positive for benzos and amphetamines    Environmental/Housing/Living Arrangement  Stable housing   Who is in the home:   Morrie Sheldon, girlfriend   Emergency contact:  Morrie Sheldon    Patient's Strengths  and Goals (patient's own words):   Pt is compliant with medical advice while in hospital. Ophelia Charter is at bedside.  Pt is safe.  Pt is agreeable to be admitted to [facility].   Clinical Social Worker's Interpretive Summary:   Psych CSW assessed pt at bedside.  CSW introduced self and Psych CSW role.  Pt was alert and oriented x4.  Pt presents restless and agitated.  Pt is frustrated with the news of the psychiatrist wanting the pt to be admitted to [facility].    Pt denies SI/HI.  Pt denies AHVD.  Pt reports that he was not trying to commit suicide, he was  merely trying to get high and took too many Adderall pills.  Pt reports this was an accident.  Psych CSW reminded the pt that he has presented to [facility] twice within the past month for the same sx.  Pt admits overdosing in the past, but denies SI.    Pt requests residential treatment.  Per chart review, Arfeen, MD reports that pt may seek this level of care AFTER dc from [facility].  This was relayed to the pt.  Pt states that he has looked into a facility in [facility].    Psych CSW will arrange transportation to [facility] via Security with [employer] once dc summary has been completed.  RN, MD, pt, sitter and [facility] all aware.  Pt signed voluntary form and Psych CSW faxed the form to [facility].  The original was placed in the chart to be delivered with pt to [facility] upon admission.   Disposition:  Psych Clinical Social Worker signing off  Vickii Penna, [place] [phone]  Clinical Social Work

## 2012-11-05 NOTE — Progress Notes (Signed)
Pt.  Stayed in bed this pm.  He did get up to get his HS medication.  Pt. Is disheveled and irritable. Pt. Denies SI and HI, A and VH. NO complaints of pain or discomfort but was irritated that he was only prescribed 50mg  of seroquel for HS.  Pt. Has been sleeping all evening.

## 2012-11-05 NOTE — Clinical Social Work Psych Note (Addendum)
1:06pm- Psych CSW received a call from [place] at [facility] stating that pt bed is available.   Pt will be admitted to [facility] to bed 302-2.   Psych contacted MD, Rizwan to update- message left.      Psych CSW spoke with MD re: possible [facility] admission.  Psych CSW referred pt to [facility] and is currently awaiting a response.  Psych CSW will update medical staff once [facility] responds.  Vickii Penna, LCSWA [phone]  Clinical Social Work

## 2012-11-05 NOTE — Plan of Care (Signed)
Report called to behavioral health facility.

## 2012-11-05 NOTE — Tx Team (Signed)
Initial Interdisciplinary Treatment Plan  PATIENT STRENGTHS: (choose at least two) Average or above average intelligence Capable of independent living Communication skills General fund of knowledge Supportive family/friends  PATIENT STRESSORS: Financial difficulties Legal issue Substance abuse   PROBLEM LIST: Problem List/Patient Goals Date to be addressed Date deferred Reason deferred Estimated date of resolution  Substance abuse, need to go to long term treatment 05 Nov 2012                                                      DISCHARGE CRITERIA:  Ability to meet basic life and health needs Adequate post-discharge living arrangements Improved stabilization in mood, thinking, and/or behavior Motivation to continue treatment in a less acute level of care  PRELIMINARY DISCHARGE PLAN: Attend aftercare/continuing care group Outpatient therapy  PATIENT/FAMIILY INVOLVEMENT: This treatment plan has been presented to and reviewed with the patient, Brandon [name], and/or family member.  The patient and family have been given the opportunity to ask questions and make suggestions.  [name] 11/05/2012, 6:55 PM

## 2012-11-05 NOTE — Discharge Summary (Signed)
Physician Discharge Summary  Brandon [name] AVW:[id number] DOB: 30-Nov-1965 DOA: 11/02/2012  PCP: No primary provider on file.  Admit date: 11/02/2012 Discharge date: 11/05/2012  Time spent: >45 minutes  Discharge Diagnoses:  Principal Problem:   Overdose by amphetamine Active Problems:   ANEMIA NOS   Bipolar affective disorder   Cocaine abuse   Discharge Condition: stable  Diet recommendation: heart heathy   Filed Weights   11/02/12 0027 11/02/12 0330  Weight: 108.863 kg (240 lb) 97.7 kg (215 lb 6.2 oz)    History of present illness:  47 y.o. male who presented to the ED after taking an overdose of Adderall tablets. He reported taking 24+ 20 mg Adderall tablets in order to get high. He denied having any suicidal ideation, but did admit that he knew it could kill him. He felt pain and swelling in his tongue and thought that he may have been suffering from an allergic reaction so he told his father who had him sent to the ED at [facility]. He was evslauted there and his UDS was positive for Benzodiazepines and Amphetamines, and Poison Control was contacted and recommended cardiovascular monitoring for 24 to 48 hours.  This is his second admission in a month. He was admitted on 8/18 for a suicide attempt with an overdose from [facility] and admitted to Behavioral medicine.    Hospital Course:  Subjective: The patient tells me today that he came to the hospital after swelling of his tongue. He admits to overdosing on Adderal in an attempt to "get high" and is now aware that it is dangerous. He has depression and would like treatment for it.    OD on Rx amphetamine  Was tx for suicidal ideation 10/14/2012 after OD on narcotics, cocaine, and geodan - denies SI this episode, but has admitted to feeling hopeless and knowing his actions could have ended his life - Psych has recommended inpt tx and I agree - pt has now cleared his overdose with no persisting effects - no hyperthermia or tachycardia at  this time - Dr Dolphus Jenny spoke w/ Psych MD who clarifies that pt IS felt to be a threat to himself - I have spoken with the socail worker in regards to transfer to behavioral med- there is a bed available and pt is agreeable. I feel he is medically stable to be discharged.    Dystonic reaction vs/ Tardive dyskinesia?  -  Suspect  TD -  -given a trial of benztropine while in the hospital- - I did witness an episode just now of mouth deviating to the right with O shaped movement of the mouth - treatment per psych  Polysubstance abuse  narcotics + cocaine + amphetamines - reports that he is interested in rehab option - CSW consulted - plan is to transfer to [facility] as discussed above - if pt desires long term residential tx at a private facility, this could be pursued after he is d/c from [facility]   Bipolar D/O  Med tx at direction of Psych   Consultations:  Psych- Dr Florentina Jenny  Discharge Exam: Filed Vitals:   11/05/12 1000  BP: 118/66  Pulse: 60  Temp: 97.3 F (36.3 C)  Resp: 14    General: AAO x 3, per RN he was restless this AM Cardiovascular: RRR, no murmurs Respiratory: CTA b/l   Discharge Instructions      Discharge Orders   Future Orders Complete By Expires   Diet - low sodium heart healthy  As directed  Increase activity slowly  As directed        Medication List    STOP taking these medications       gabapentin 100 MG capsule  Commonly known as:  NEURONTIN     traZODone 100 MG tablet  Commonly known as:  DESYREL     ziprasidone 20 MG capsule  Commonly known as:  GEODON      TAKE these medications       testosterone cypionate 100 MG/ML injection  Commonly known as:  DEPOTESTOTERONE CYPIONATE  Inject 100 mg into the muscle every 28 (twenty-eight) days. For IM use only       Allergies  Allergen Reactions  . Other Anaphylaxis    Lima, kidney and pork & beans       The results of significant diagnostics from this hospitalization (including imaging,  microbiology, ancillary and laboratory) are listed below for reference.    Significant Diagnostic Studies: No results found.  Microbiology: Recent Results (from the past 240 hour(s))  MRSA PCR SCREENING     Status: None   Collection Time    11/02/12  3:45 AM      Result Value Range Status   MRSA by PCR NEGATIVE  NEGATIVE Final   Comment:            The GeneXpert MRSA Assay (FDA     approved for NASAL specimens     only), is one component of a     comprehensive MRSA colonization     surveillance program. It is not     intended to diagnose MRSA     infection nor to guide or     monitor treatment for     MRSA infections.     Labs: Basic Metabolic Panel:  Recent Labs Lab 11/02/12 0026 11/02/12 0559  NA 137 138  K 3.7 3.7  CL 98 103  CO2 26 23  GLUCOSE 158* 97  BUN 13 11  CREATININE 1.00 0.96  CALCIUM 9.5 8.7   Liver Function Tests:  Recent Labs Lab 11/02/12 0026  AST 29  ALT 19  ALKPHOS 59  BILITOT 0.8  PROT 7.1  ALBUMIN 3.8   No results found for this basename: LIPASE, AMYLASE,  in the last 168 hours No results found for this basename: AMMONIA,  in the last 168 hours CBC:  Recent Labs Lab 11/02/12 0026 11/02/12 0559  WBC 9.8 6.6  NEUTROABS 6.4  --   HGB 13.5 12.7*  HCT 38.9* 36.0*  MCV 90.3 89.6  PLT 440* 360   Cardiac Enzymes:  Recent Labs Lab 11/04/12 0405  CKTOTAL 76   BNP: BNP (last 3 results)  Recent Labs  05/25/12 1825  PROBNP 43.1   CBG: No results found for this basename: GLUCAP,  in the last 168 hours     Signed:  Marrietta Thunder  [facility] 11/05/2012, 2:34 PM

## 2012-11-05 NOTE — Clinical Social Work Psych Note (Signed)
Psych CSW assessed pt at bedside.  Full assessment to follow. Pt is agreeable to be admitted to [facility].  Psych witnessed the voluntary form being signed by pt.  Psych CSW faxed this form to [facility].  Pt bed is available pending completion of dc summary.  Psych made MD aware.  Pt will be xported by security.  Once dc summary has been completed, please contact [facility] security for transport.  Sitter (at bedside) will ride along with security to [facility].  Pt will need to bring the completed/signed voluntary form to [facility] (located in pt chart).  This information was given to pt Designer, television/film set.  Vickii Penna, LCSWA [phone]  Clinical Social Work

## 2012-11-06 ENCOUNTER — Encounter (HOSPITAL_COMMUNITY): Payer: Self-pay | Admitting: Psychiatry

## 2012-11-06 DIAGNOSIS — F1920 Other psychoactive substance dependence, uncomplicated: Secondary | ICD-10-CM | POA: Diagnosis present

## 2012-11-06 DIAGNOSIS — F3130 Bipolar disorder, current episode depressed, mild or moderate severity, unspecified: Principal | ICD-10-CM

## 2012-11-06 MED ORDER — LORAZEPAM 1 MG PO TABS
1.0000 mg | ORAL_TABLET | Freq: Four times a day (QID) | ORAL | Status: DC | PRN
  Administered 2012-11-06 – 2012-11-09 (×8): 1 mg via ORAL
  Filled 2012-11-06 (×8): qty 1

## 2012-11-06 MED ORDER — LORAZEPAM 1 MG PO TABS
ORAL_TABLET | ORAL | Status: AC
  Filled 2012-11-06: qty 2

## 2012-11-06 MED ORDER — ZOLPIDEM TARTRATE 10 MG PO TABS
10.0000 mg | ORAL_TABLET | Freq: Every evening | ORAL | Status: DC | PRN
  Administered 2012-11-06 – 2012-11-08 (×3): 10 mg via ORAL
  Filled 2012-11-06 (×3): qty 1

## 2012-11-06 MED ORDER — ZIPRASIDONE HCL 60 MG PO CAPS
120.0000 mg | ORAL_CAPSULE | Freq: Every day | ORAL | Status: DC
  Administered 2012-11-06 – 2012-11-08 (×3): 120 mg via ORAL
  Filled 2012-11-06 (×3): qty 2
  Filled 2012-11-06: qty 28
  Filled 2012-11-06: qty 2
  Filled 2012-11-06: qty 28

## 2012-11-06 MED ORDER — LORAZEPAM 1 MG PO TABS
2.0000 mg | ORAL_TABLET | Freq: Once | ORAL | Status: AC
  Administered 2012-11-06: 2 mg via ORAL

## 2012-11-06 NOTE — Progress Notes (Signed)
Psychoeducational Group Note  Date:  11/06/2012 Time:  2000  Group Topic/Focus:  NA group  Participation Level: Did Not Attend  Participation Quality:  Not Applicable  Affect:  Not Applicable  Cognitive:  Not Applicable  Insight:  Not Applicable  Engagement in Group: Not Applicable  Additional Comments:    Flonnie Hailstone 11/06/2012, 9:47 PM

## 2012-11-06 NOTE — Tx Team (Signed)
Interdisciplinary Treatment Plan Update (Adult)  Date: 11/06/2012   Time Reviewed: 11:21 AM  Progress in Treatment:  Attending groups: No.  Participating in groups: No.   Taking medication as prescribed: Yes  Tolerating medication: Yes  Family/Significant othe contact made: Not yet. SPE required for this pt.  Patient understands diagnosis: Yes, AEB seeking treatment for substance abuse and depression.  Discussing patient identified problems/goals with staff: Yes  Medical problems stabilized or resolved: Yes  Denies suicidal/homicidal ideation: Yes  Patient has not harmed self or Others: Yes  New problem(s) identified: Pt currently not attending d/c planning groups and stated that he was not compliant with meds/did not go to appts scheduled during last admission 2 weeks ago.  Discharge Plan or Barriers: CSW currently assessing for appropriate referrals. ADATC referral/ACT team? Additional comments: Brandon [name] is a 47 y.o. male who presents to the ED after taking an overdose of Adderall tablets. He reports taking 24 + 20 mg Adderall tablets in order to get high. He denied having any suicidal ideation. He felt pain and swelling in his tongue and thought that he may have been suffering from an allergic reaction. So he told his father who had him sent to the ED at [facility]. He was evslauted there an his UDS was positive for Benzodiazepines, and Amphetamines, and Poison Control was contacted and recommended cardiovascular monitoring for the next 24 to 48 hours.  Reason for Continuation of Hospitalization:  Ativan taper-withdrawals Mood stabilization Medication management  Estimated length of stay: 3-5 days  For review of initial/current patient goals, please see plan of care.  Attendees:  Patient:    Family:    Physician: Geoffery Lyons MD 11/06/2012 11:21 AM   Nursing: Lupita Leash RN 11/06/2012 11:21 AM   Clinical Social Worker Paton Crum Smart, LCSWA  11/06/2012 11:21 AM   Other: Kendell Bane RN 11/06/2012 11:21 AM    Other: Darden Dates Nurse CM 11/06/2012 11:21 AM   Other: Massie Kluver, Community Care Coordinator  11/06/2012 11:21 AM   Other:    Scribe for Treatment Team:  [employer] LCSWA 11/06/2012 11:21 AM

## 2012-11-06 NOTE — Progress Notes (Signed)
Patient ID: Brandon [name], male   DOB: 06-27-1965, 47 y.o.   MRN: [id number] D: Pt. At med window requesting medications, "they said I can have them at eight o'clock" "I can't keep still, I'm not going to group" A: Writer introduced self to client and explained no medications due until nine o'clock. Pt. Appear to be trying to produce tremor, noted in hall call no tremor prior to coming to med window. Pt. Then went to bed sound asleep, no tremors noted. Staff will monitor q46min for checks. R: Pt. Is safe on the unit. Pt. Refused group.
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
## 2012-11-06 NOTE — Progress Notes (Signed)
Adult Psychoeducational Group Note  Date:  11/06/2012 Time:  11:00AM Group Topic/Focus:  Personal Choices and Values:   The focus of this group is to help patients assess and explore the importance of values in their lives, how their values affect their decisions, how they express their values and what opposes their expression.  Participation Level:  Did Not Attend    Additional Comments: Pt. Didn't attend group.   Bing Plume D 11/06/2012, 1:22 PM

## 2012-11-06 NOTE — BHH Group Notes (Signed)
[facility] LCSW Aftercare Discharge Planning Group Note   11/06/2012 9:28 AM  Participation Quality:  DID NOT ATTEND   Smart, Herbert Seta

## 2012-11-06 NOTE — BHH Suicide Risk Assessment (Signed)
Suicide Risk Assessment  Admission Assessment     Nursing information obtained from:  Patient Demographic factors:  Male;Low socioeconomic status Current Mental Status:  NA Loss Factors:  Loss of significant relationship;Legal issues;Financial problems / change in socioeconomic status Historical Factors:  Prior suicide attempts;Impulsivity Risk Reduction Factors:  Living with another person, especially a relative  CLINICAL FACTORS:   Bipolar Disorder:   Depressive phase Alcohol/Substance Abuse/Dependencies  COGNITIVE FEATURES THAT CONTRIBUTE TO RISK:  Closed-mindedness Polarized thinking Thought constriction (tunnel vision)    SUICIDE RISK:   Moderate:  Frequent suicidal ideation with limited intensity, and duration, some specificity in terms of plans, no associated intent, good self-control, limited dysphoria/symptomatology, some risk factors present, and identifiable protective factors, including available and accessible social support.  PLAN OF CARE: Supportive approach/coping skills/relapse prevention                              Get collateral information about what happened                              Explore placements options  I certify that inpatient services furnished can reasonably be expected to improve the patient's condition.  Marcell Pfeifer A 11/06/2012, 4:56 PM

## 2012-11-06 NOTE — Progress Notes (Signed)
D: Spent majority of morning in bed laying down. Appears disheveled and anxious at times. After speaking with doctor observed to be very tremulous and anxious, ordered and given ativan 2mg  po x1 dose. Did not attend most groups, minimal interaction with peers. A: Support and encouragement given, encouraged group attendance. Meds given as ordered. Maintained Q15 min checks for safety. R: Refused flu shot ordered, states he "never gets that".  Remains anxious and tremulous when up at medication window. When informed of prn ativan ordered every six hours pt replied "they usually give it to me every three hours in the other hospital". Contracts for safety while in the hospital. Spent majority of shift in room.

## 2012-11-06 NOTE — BHH Group Notes (Signed)
[facility] LCSW Group Therapy  11/06/2012 3:01 PM  Type of Therapy:  Group Therapy  Participation Level:  Did Not Attend  Smart, Brandon [name] 11/06/2012, 3:01 PM

## 2012-11-06 NOTE — BHH Counselor (Signed)
Adult Psychosocial Assessment Update Interdisciplinary Team  Previous [facility] Hospital admissions/discharges:  Admissions Discharges  Date: 11/05/12 Date: unknown  Date: 10/16/12 Date: 10/24/12  Date: 05/30/12 Date: 06/03/12  Date: 03/16/12 Date: 03/18/12  Date: Date:   Changes since the last Psychosocial Assessment (including adherence to outpatient mental health and/or substance abuse treatment, situational issues contributing to decompensation and/or relapse). This is yet another admission assessment for this 47 year old Caucasian male. Brandon [name] has had several inpatient admissions in this hospital in the past. Admitted to [facility] from the [facility] with complaints of suicide attempt by overdose. Patient reports, "The ambulance took me to the [facility] on Monday this week. My dad called them because I was very depressed. I have been depressed x a couple of months. On Monday, my depression worsened, I could not take it any more, I attempted suicide by overdose. I took bunch of Geodon pills. I passed out by the time the ambulance got to me. I don't know or remembered what happened when the EMS got me to the hospital. But I know that I was there x 3 days. I have bipolar disorder, had it for years. I used drugs too, I just don't remember the last time or how much I had used. I used heroin, cocaine and some other pills. I was sober for almost 3 years a long time ago. Right now, I feel fatigued, run down".              Discharge Plan 1. Will you be returning to the same living situation after discharge?   Yes: No:      If no, what is your plan?    CSW currently assessing for appropriate referrals. Pt lives in [place] by himself currently. His father is primary support.        2. Would you like a referral for services when you are discharged? Yes:     If yes, for what services?  No:       CSW currently assessing for appropriate referrals. Will present [facility] as option due to  high number of hospitalizations within the past year in addition to ACT team-possibly [facility].        Summary and Recommendations (to be completed by the evaluator) Pt is 47 year old male living in [place], [place] He presents to [facility] after thoughts of SI and overdose on Geodon pills, and worsening depression. Pt was recently discharged and stated that he did not go to his follow up therapy or med management appts. Recommendations for pt include: crisis stabilization, therapeutic milieu, encourage group attendance and participation, ativan taper for withdrawals, medication management for mood stabilization, and development of comprehensive mental wellness/sobriety plan. During last admission to [facility], pt was set up with [facility] [place] [facility] for therapy and [facility] for med management (Dr. Netta Neat).                        Signature:  Smart, [place], 11/06/2012 11:40 AM

## 2012-11-07 DIAGNOSIS — F1994 Other psychoactive substance use, unspecified with psychoactive substance-induced mood disorder: Secondary | ICD-10-CM

## 2012-11-07 DIAGNOSIS — F39 Unspecified mood [affective] disorder: Secondary | ICD-10-CM

## 2012-11-07 MED ORDER — METHOCARBAMOL 500 MG PO TABS
750.0000 mg | ORAL_TABLET | Freq: Four times a day (QID) | ORAL | Status: DC | PRN
  Administered 2012-11-07 – 2012-11-09 (×4): 750 mg via ORAL
  Filled 2012-11-07: qty 2
  Filled 2012-11-07 (×4): qty 1
  Filled 2012-11-07: qty 2

## 2012-11-07 NOTE — Progress Notes (Signed)
D: Patient denies SI/HI and auditory and visual hallucinations. The patient has an anxious mood and a blunted affect. The patient appears to be trying to fake withdrawal symptoms. The patient was observed by RN this morning walking with no difficulty, breathing with no difficulty, and eating and drinking with no difficulty. Patient then came to RN later in the morning stating that he "couldn't breathe, see or walk." Patient is attending some groups and has little participation within the milieu.  A: Patient given emotional support from RN. Patient encouraged to come to staff with concerns and/or questions. Patient's medication routine continued. Patient's orders and plan of care reviewed. RN assessed patient and noted no respiratory or gait problems. The patient appeared to be making himself hyperventilate. NP notified of patient's concerns and behavior. No new orders were given. Patient was informed of this and states that he wanted to lie down in his room.  R: Patient remains cooperative but somatic. RN and MHT assisted patient back to his room. The patient is currently resting with no distress noted. Patient was also offered food and drink from RN but patient declined. Will continue to monitor patient q15 minutes for safety.

## 2012-11-07 NOTE — Progress Notes (Signed)
[facility] MD Progress Note  11/07/2012 6:27 PM Brandon [name]  MRN:  [id number] Subjective:  Brandon [name] endorses that he is still having a lot of symptoms. He is still having some of the agitation, now muscle spasms. Did sleep better last night. His ex girlfriend is going to come back to visit him. He states he really needs to go to rehab as he is not going to be able to make it otherwise Diagnosis:   DSM5: Schizophrenia Disorders:   Obsessive-Compulsive Disorders:   Trauma-Stressor Disorders:   Substance/Addictive Disorders:  Alcohol Related Disorder - Severe (303.90), Cannabis Use Disorder - Severe (304.30) and Opioid Disorder - Severe (304.00) Depressive Disorders:    Axis I: Mood Disorder NOS and Substance Induced Mood Disorder  ADL's:  Intact  Sleep: better last night  Appetite:  Poor  Suicidal Ideation:  Plan:  denies Intent:  denies Means:  denies Homicidal Ideation:  Plan:  denies Intent:  denies Means:  denies AEB (as evidenced by):  Psychiatric Specialty Exam: Review of Systems  HENT: Positive for neck pain.   Eyes: Negative.   Respiratory: Negative.   Cardiovascular: Negative.   Gastrointestinal: Negative.   Genitourinary: Negative.   Musculoskeletal: Positive for myalgias and back pain.  Skin: Negative.   Neurological: Positive for weakness.  Endo/Heme/Allergies: Negative.   Psychiatric/Behavioral: Positive for depression and substance abuse. The patient is nervous/anxious and has insomnia.     Blood pressure 107/80, pulse 86, temperature 97.2 F (36.2 C), temperature source Oral, resp. rate 20.There is no weight on file to calculate BMI.  General Appearance: Disheveled  Eye Solicitor::  Fair  Speech:  Slurred  Volume:  fluctuates  Mood:  Anxious, Depressed and Dysphoric  Affect:  Restricted  Thought Process:  Coherent and Goal Directed  Orientation:  Full (Time, Place, and Person)  Thought Content:  somatically focused, worries, concerns  Suicidal Thoughts:  No   Homicidal Thoughts:  No  Memory:  Immediate;   Fair Recent;   Fair Remote;   Fair  Judgement:  Fair  Insight:  Shallow  Psychomotor Activity:  Restlessness  Concentration:  Fair  Recall:  Fair  Akathisia:  No  Handed:  Right  AIMS (if indicated):     Assets:  Desire for Improvement  Sleep:  Number of Hours: 6.5   Current Medications: Current Facility-Administered Medications  Medication Dose Route Frequency Provider Last Rate Last Dose  . acetaminophen (TYLENOL) tablet 650 mg  650 mg Oral Q6H PRN Fransisca Kaufmann, NP      . alum & mag hydroxide-simeth (MAALOX/MYLANTA) 200-200-20 MG/5ML suspension 30 mL  30 mL Oral Q4H PRN Fransisca Kaufmann, NP      . LORazepam (ATIVAN) tablet 1 mg  1 mg Oral Q6H PRN Rachael Fee, MD   1 mg at 11/07/12 1654  . magnesium hydroxide (MILK OF MAGNESIA) suspension 30 mL  30 mL Oral Daily PRN Fransisca Kaufmann, NP      . methocarbamol (ROBAXIN) tablet 750 mg  750 mg Oral Q6H PRN Rachael Fee, MD   750 mg at 11/07/12 1322  . ziprasidone (GEODON) capsule 120 mg  120 mg Oral QHS Rachael Fee, MD   120 mg at 11/06/12 2112  . zolpidem (AMBIEN) tablet 10 mg  10 mg Oral QHS PRN Rachael Fee, MD   10 mg at 11/06/12 2112    Lab Results: No results found for this or any previous visit (from the past 48 hour(s)).  Physical Findings: AIMS: Facial and  Oral Movements Muscles of Facial Expression: None, normal Lips and Perioral Area: None, normal Jaw: None, normal Tongue: None, normal,Extremity Movements Upper (arms, wrists, hands, fingers): None, normal Lower (legs, knees, ankles, toes): None, normal, Trunk Movements Neck, shoulders, hips: None, normal, Overall Severity Severity of abnormal movements (highest score from questions above): None, normal Incapacitation due to abnormal movements: None, normal Patient's awareness of abnormal movements (rate only patient's report): No Awareness, Dental Status Current problems with teeth and/or dentures?: Yes (Missing teeth) Does  patient usually wear dentures?: No  CIWA:    COWS:     Treatment Plan Summary: Daily contact with patient to assess and evaluate symptoms and progress in treatment Medication management  Plan: Supportive approach/coping skills/continue to use the Ativan for detox            Reassess and address the co morbidities            Robaxin 750 Q 6 HRS PRN  Medical Decision Making Problem Points:  Review of psycho-social stressors (1) Data Points:  Review of medication regiment & side effects (2) Review of new medications or change in dosage (2)  I certify that inpatient services furnished can reasonably be expected to improve the patient's condition.   Monia Timmers A 11/07/2012, 6:27 PM

## 2012-11-07 NOTE — BHH Group Notes (Signed)
[facility] LCSW Group Therapy  11/07/2012 4:07 PM  Type of Therapy:  Group Therapy  Participation Level:  Did Not Attend  Smart, Herbert Seta 11/07/2012, 4:07 PM

## 2012-11-07 NOTE — Progress Notes (Signed)
Patient ID: DEVELL PARKERSON, male   DOB: 02-04-1966, 47 y.o.   MRN: [id number] D: Patient in dayroom on approach. Pt presented with depressed mood and flat affect. Pt reports withdrawing earlier earlier today but is feeling well now. Pt c/o of headache but no other withdrawals.  Pt denies SI/HI/AVH. Pt attended evening karaoke group and was supportive of peers. Pt denies any needs or concerns.  Cooperative with assessment. No acute distressed noted at this time.   A: Met with pt 1:1. Medications administered as prescribed. Writer encouraged pt to discuss feelings. Pt encouraged to come to staff with any question or concerns. 15 minutes checks for safety.  R: Patient remains safe. He is complaint with medications and denies any adverse reaction. Continue current POC.

## 2012-11-08 MED ORDER — BENZTROPINE MESYLATE 1 MG/ML IJ SOLN
2.0000 mg | Freq: Once | INTRAMUSCULAR | Status: AC
  Administered 2012-11-08: 2 mg via INTRAMUSCULAR

## 2012-11-08 MED ORDER — BENZTROPINE MESYLATE 2 MG PO TABS
2.0000 mg | ORAL_TABLET | Freq: Two times a day (BID) | ORAL | Status: DC
  Administered 2012-11-09: 2 mg via ORAL
  Filled 2012-11-08 (×2): qty 28
  Filled 2012-11-08: qty 1
  Filled 2012-11-08: qty 2
  Filled 2012-11-08: qty 1
  Filled 2012-11-08: qty 28
  Filled 2012-11-08: qty 1
  Filled 2012-11-08: qty 28

## 2012-11-08 MED ORDER — DIPHENHYDRAMINE HCL 50 MG PO CAPS
100.0000 mg | ORAL_CAPSULE | Freq: Once | ORAL | Status: AC
  Administered 2012-11-08: 100 mg via ORAL

## 2012-11-08 MED ORDER — BENZTROPINE MESYLATE 1 MG/ML IJ SOLN
INTRAMUSCULAR | Status: AC
  Filled 2012-11-08: qty 2

## 2012-11-08 NOTE — Progress Notes (Signed)
D.  Pt. Reported that he felt that his jaw was locking and that he usually takes Cogentin with his Geodon but had not had any Cogentin. Shon Hale PA notified.    Cogentin 2 mg IM and Benadryl 100 mg  PO ordered and given. R.  Pt. Reported that his jaw was movable after medications given.    No problems with swallowing.
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
## 2012-11-08 NOTE — Progress Notes (Signed)
Patient ID: PURL CLAYTOR, male   DOB: 02-17-1966, 47 y.o.   MRN: [id number] D: Patient in dayroom on approach. Pt seemed drowsy from benadryl and cogentin given earlier. Pt stated his jaw feels better and able to move freely. Pt mood and affect appeared depressed and flat. Pt denies any other S/S of withdrawals.  Pt denies SI/HI/AVH. Pt attended evening AA group and was engaged in discussion. Pt denies any needs or concerns.  Cooperative with assessment. No acute distressed noted at this time.   A: Met with pt 1:1. Medications administered as prescribed. Writer encouraged pt to discuss feelings. Pt encouraged to come to staff with any question or concerns. 15 minutes checks for safety.  R: Patient remains safe. He is complaint with medications. Continue current POC.

## 2012-11-08 NOTE — [facility] Group Notes (Signed)
[facility] LCSW Group Therapy  11/08/2012 3:47 PM  Type of Therapy:  Group Therapy  Participation Level:  Active  Participation Quality:  Attentive  Affect:  Excited  Cognitive:  Oriented  Insight:  Limited  Engagement in Therapy:  Engaged  Modes of Intervention:  Discussion, Education, Exploration, Socialization and Support  Summary of Progress/Problems: Feelings around Relapse. Group members discussed the meaning of relapse and shared personal stories of relapse, how it affected them and others, and how they perceived themselves during this time. Group members were encouraged to identify triggers, warning signs and coping skills used when facing the possibility of relapse. Social supports were discussed and explored in detail. Post Acute Withdrawal Syndrome (handout provided) was introduced and examined. Pt's were encouraged to ask questions, talk about key points associated with PAWS, and process this information in terms of relapse prevention. Brandon [name] was attentive and engaged throughout today's group. He shows improvement in the group setting AEB his ability to actively participate in group discussion. He demonstrates limited insight AEB his inability to explore potential safety plan options for combating PAWS. Brandon [name] stated that "my motivation and sheer willpower should be enough." CSW confronted pt and pointed out that he stated something similar during last admission at [facility]. Brandon [name] was able to acknowledge the importance of going to his follow up appts and staying away from people that "drag me down and are addicts." Brandon [name] identified family members as strong supports and was able to identify "going to AA and talking to my therapist and eating healthier/taking care of my body" as ways to prevent relapse.    Smart, Dhruti Ghuman 11/08/2012, 3:47 PM

## 2012-11-08 NOTE — [facility] Suicide Risk Assessment (Signed)
[facility] INPATIENT:  Family/Significant Other Suicide Prevention Education  Suicide Prevention Education:  Education Completed; Brandon Monnier Sr. (pt's father) has been identified by the patient as the family member/significant other with whom the patient will be residing, and identified as the person(s) who will aid the patient in the event of a mental health crisis (suicidal ideations/suicide attempt).  With written consent from the patient, the family member/significant other has been provided the following suicide prevention education, prior to the and/or following the discharge of the patient.  The suicide prevention education provided includes the following:  Suicide risk factors  Suicide prevention and interventions  National Suicide Hotline telephone number  [facility] assessment telephone number  [facility] Emergency Assistance 911  [facility] and/or Residential Mobile Crisis Unit telephone number  Request made of family/significant other to:  Remove weapons (e.g., guns, rifles, knives), all items previously/currently identified as safety concern.    Remove drugs/medications (over-the-counter, prescriptions, illicit drugs), all items previously/currently identified as a safety concern.  The family member/significant other verbalizes understanding of the suicide prevention education information provided.  The family member/significant other agrees to remove the items of safety concern listed above.  Brandon [name], Brandon [name] 11/08/2012, 11:19 AM

## 2012-11-08 NOTE — Progress Notes (Signed)
Adult Psychoeducational Group Note  Date:  11/08/2012 Time:  11:38 AM  Group Topic/Focus:  Relapse Prevention Planning:   The focus of this group is to define relapse and discuss the need for planning to combat relapse.  Participation Level:  Active  Participation Quality:  Appropriate  Affect:  Appropriate  Cognitive:  Appropriate  Insight: Appropriate  Engagement in Group:  Engaged  Modes of Intervention:  Discussion    Elijio Miles 11/08/2012, 11:38 AM

## 2012-11-08 NOTE — Progress Notes (Signed)
D:Pt reports that he is feeling better and planning to discharge tomorrow. Pt has c/o anxiety and is out in dayroom interacting with peers. A:Offered support, encouragement and 15 minute checks. R:Pt denies si and hi. Safety maintained on the unit.

## 2012-11-08 NOTE — Progress Notes (Signed)
Patient ID: ZYAIR RUSSI, male   DOB: Jun 23, 1965, 47 y.o.   MRN: [id number] [facility] MD Progress Note  11/08/2012 2:21 PM JORY TANGUMA  MRN:  [id number]  Subjective:  Heinrich endorses that he is still going through withdrawal symptoms and anxiety. He complained of some tremors, feeling bouncy and have some anxiety symptoms. He rated his anxiety at #5. He planned on going to some substance abuse treatment after discharged. Will do things differently this time after discharge. He says that he would not let things get to him to a point whereby he has to seek relief the wrong way, liking using some substance.   Diagnosis:   DSM5: Schizophrenia Disorders:   Obsessive-Compulsive Disorders:   Trauma-Stressor Disorders:   Substance/Addictive Disorders:  Alcohol Related Disorder - Severe (303.90), Cannabis Use Disorder - Severe (304.30) and Opioid Disorder - Severe (304.00) Depressive Disorders:    Axis I: Mood Disorder NOS and Substance Induced Mood Disorder  ADL's:  Intact  Sleep: better last night  Appetite:  Poor  Suicidal Ideation:  Plan:  denies Intent:  denies Means:  denies Homicidal Ideation:  Plan:  denies Intent:  denies Means:  denies AEB (as evidenced by):  Psychiatric Specialty Exam: Review of Systems  HENT: Positive for neck pain.   Eyes: Negative.   Respiratory: Negative.   Cardiovascular: Negative.   Gastrointestinal: Negative.   Genitourinary: Negative.   Musculoskeletal: Positive for myalgias and back pain.  Skin: Negative.   Neurological: Positive for weakness.  Endo/Heme/Allergies: Negative.   Psychiatric/Behavioral: Positive for depression and substance abuse. The patient is nervous/anxious and has insomnia.     Blood pressure 130/82, pulse 91, temperature 97.2 F (36.2 C), temperature source Oral, resp. rate 16.There is no weight on file to calculate BMI.  General Appearance: Disheveled  Eye Solicitor::  Fair  Speech:  Slurred  Volume:  fluctuates  Mood:   Anxious, Depressed and Dysphoric  Affect:  Restricted  Thought Process:  Coherent and Goal Directed  Orientation:  Full (Time, Place, and Person)  Thought Content:  somatically focused, worries, concerns  Suicidal Thoughts:  No  Homicidal Thoughts:  No  Memory:  Immediate;   Fair Recent;   Fair Remote;   Fair  Judgement:  Fair  Insight:  Shallow  Psychomotor Activity:  Restlessness  Concentration:  Fair  Recall:  Fair  Akathisia:  No  Handed:  Right  AIMS (if indicated):     Assets:  Desire for Improvement  Sleep:  Number of Hours: 6.5   Current Medications: Current Facility-Administered Medications  Medication Dose Route Frequency Provider Last Rate Last Dose  . acetaminophen (TYLENOL) tablet 650 mg  650 mg Oral Q6H PRN Fransisca Kaufmann, NP   650 mg at 11/07/12 2006  . alum & mag hydroxide-simeth (MAALOX/MYLANTA) 200-200-20 MG/5ML suspension 30 mL  30 mL Oral Q4H PRN Fransisca Kaufmann, NP      . LORazepam (ATIVAN) tablet 1 mg  1 mg Oral Q6H PRN Rachael Fee, MD   1 mg at 11/08/12 1243  . magnesium hydroxide (MILK OF MAGNESIA) suspension 30 mL  30 mL Oral Daily PRN Fransisca Kaufmann, NP      . methocarbamol (ROBAXIN) tablet 750 mg  750 mg Oral Q6H PRN Rachael Fee, MD   750 mg at 11/07/12 2205  . ziprasidone (GEODON) capsule 120 mg  120 mg Oral QHS Rachael Fee, MD   120 mg at 11/07/12 2205  . zolpidem (AMBIEN) tablet 10 mg  10 mg  Oral QHS PRN Rachael Fee, MD   10 mg at 11/07/12 2205    Lab Results: No results found for this or any previous visit (from the past 48 hour(s)).  Physical Findings: AIMS: Facial and Oral Movements Muscles of Facial Expression: None, normal Lips and Perioral Area: None, normal Jaw: None, normal Tongue: None, normal,Extremity Movements Upper (arms, wrists, hands, fingers): None, normal Lower (legs, knees, ankles, toes): None, normal, Trunk Movements Neck, shoulders, hips: None, normal, Overall Severity Severity of abnormal movements (highest score from  questions above): None, normal Incapacitation due to abnormal movements: None, normal Patient's awareness of abnormal movements (rate only patient's report): No Awareness, Dental Status Current problems with teeth and/or dentures?: Yes (Missing teeth) Does patient usually wear dentures?: No  CIWA:    COWS:     Treatment Plan Summary: Daily contact with patient to assess and evaluate symptoms and progress in treatment Medication management  Plan: Supportive approach/coping skills/continue to use the Ativan for detox Reassess and address the co morbidities Continue Robaxin 750 Q 6 hours PRN  Medical Decision Making Problem Points:  Review of psycho-social stressors (1) Data Points:  Review of medication regiment & side effects (2) Review of new medications or change in dosage (2)  I certify that inpatient services furnished can reasonably be expected to improve the patient's condition.   Armandina Stammer I, PMHNP-BC 11/08/2012, 2:21 PM

## 2012-11-08 NOTE — Clinical Social Work Note (Signed)
Brandon [name] asked CSW to provide court letter including dates of hospitalization at [facility]. This letter was NOT faxed per pt request. CSW put letter in pt chart for him to bring to court upon d/c.

## 2012-11-08 NOTE — Progress Notes (Signed)
[facility] Adult Case Management Discharge Plan :  Will you be returning to the same living situation after discharge: Yes,  home with parents  At discharge, do you have transportation home?:Yes,  pt's father is coming after 1:00PM on Saturday to pick up pt.  Do you have the ability to pay for your medications:Yes,  mental health  Release of information consent forms completed and in the chart;  Patient's signature needed at discharge.  Patient to Follow up at: Follow-up Information   Follow up with [facility] On 11/13/2012. (Arrive between 8:30AM-11AM for hospital followup/assessment for therapy services. Be sure to bring your discharge packet to this appt. )    Contact information:   [address] [place], [place] [address] Phone: [phone] Fax: [phone]      Patient denies SI/HI:   Yes,  during group/self report    Safety Planning and Suicide Prevention discussed:  Yes,  SPE completed with pt's father. Pt provided with SPI pamphlet and encouraged to ask questions, talk about any concerns, and share with support system.   Smart, Keniah Klemmer 11/08/2012, 12:45 PM

## 2012-11-09 DIAGNOSIS — F1910 Other psychoactive substance abuse, uncomplicated: Secondary | ICD-10-CM

## 2012-11-09 DIAGNOSIS — F411 Generalized anxiety disorder: Secondary | ICD-10-CM

## 2012-11-09 MED ORDER — METHOCARBAMOL 750 MG PO TABS: 750.0000 mg | ORAL_TABLET | Freq: Four times a day (QID) | ORAL | Status: DC | PRN

## 2012-11-09 MED ORDER — BENZTROPINE MESYLATE 2 MG PO TABS: 2.0000 mg | ORAL_TABLET | Freq: Two times a day (BID) | ORAL | Status: DC

## 2012-11-09 MED ORDER — LORAZEPAM 1 MG PO TABS: 1.0000 mg | ORAL_TABLET | Freq: Four times a day (QID) | ORAL | Status: DC | PRN

## 2012-11-09 MED ORDER — ZIPRASIDONE HCL 60 MG PO CAPS: 120.0000 mg | ORAL_CAPSULE | Freq: Every day | ORAL | Status: DC

## 2012-11-09 MED ORDER — ZOLPIDEM TARTRATE 10 MG PO TABS: 10.0000 mg | ORAL_TABLET | Freq: Every evening | ORAL | Status: AC | PRN

## 2012-11-09 MED ORDER — ZOLPIDEM TARTRATE 10 MG PO TABS: 10.0000 mg | ORAL_TABLET | Freq: Every evening | ORAL | Status: DC | PRN

## 2012-11-09 NOTE — [facility] Group Notes (Signed)
[facility] Group Notes:  (Clinical Social Work)  11/09/2012     10-11AM  Summary of Progress/Problems:   The main focus of today's process group was for the patient to identify ways in which they have in the past sabotaged their own recovery. Motivational Interviewing was utilized to ask the group members what they get out of their substance use, and what reasons they may have for wanting to change.  The Stages of Change were explained using a handout, and patients identified where they currently are with regard to stages of change.  The patient expressed that he self-sabotages by who he hangs out with, and that he goes to hang out with them, telling himself he will "just say not" but then is unable.  He wants to change because he does not want to die.  He is Preparation Stage and plans to go to [facility], get a sponsor, stay at home more, play with his pitbull dog, take his medications, and follow up with a doctor and therapist.  Type of Therapy:  Group Therapy - Process   Participation Level:  Minimal  Participation Quality:  Attentive and Sharing  Affect:  Anxious, Defensive, Depressed and Flat  Cognitive:  Appropriate  Insight:  Developing/Improving  Engagement in Therapy:  Developing/Improving  Modes of Intervention:  Education, Support and Processing, Motivational Interviewing  Ambrose Mantle, LCSW 11/09/2012, 1:11 PM

## 2012-11-09 NOTE — [facility] Suicide Risk Assessment (Signed)
Suicide Risk Assessment  Discharge Assessment     Demographic Factors:  Male, Divorced or widowed, Caucasian, Low socioeconomic status and Unemployed  Mental Status Per Nursing Assessment::   On Admission:  NA  Current Mental Status by Physician: Patient denies Suicidal ideation, intent, or plans. General Appearance: Casual and Disheveled  Eye Contact::  Fair  Speech:  Clear and Coherent and Normal Rate  Volume:  Normal  Mood:  "good"  Affect:  Appropriate, Congruent and Full Range  Thought Process:  Coherent, Linear and Logical  Orientation:  Full (Time, Place, and Person)  Thought Content:  WDL  Suicidal Thoughts:  No  Homicidal Thoughts:  No  Memory:  Immediate;   Good Recent;   Good Remote;   Good  Judgement:  Intact  Insight:  Fair  Psychomotor Activity:  Normal  Concentration:  Good  Recall:  Good  Akathisia:  No  Handed:  Right  AIMS (if indicated):   As noted  Assets:  Desire for Improvement Housing Social Support  Sleep:  Number of Hours:     Loss Factors: Decrease in vocational status, Loss of significant relationship and Financial problems/change in socioeconomic status  Historical Factors: Prior suicide attempts  Risk Reduction Factors:   Living with another person, especially a relative, Positive social support and Positive coping skills or problem solving skills  Continued Clinical Symptoms:  Substance/Addictive Disorders: Opioid Disorder - Severe (304.00)  Axis Diagnosis:  AXIS I: Anxiety Disorder NOS, Bipolar, Depressed and Substance Abuse  Cognitive Features That Contribute To Risk:  Closed-mindedness    Suicide Risk:  Minimal: No identifiable suicidal ideation.  Patients presenting with no risk factors but with morbid ruminations; may be classified as minimal risk based on the severity of the depressive symptoms  Discharge Diagnoses:   AXIS I:  Opioid Disorder - Severe (304.00)  Anxiety Disorder NEC; Bipolar I Disorder, most recent  episode,  depressed and Substance Abuse  AXIS II:  No diagnosis AXIS III:   Past Medical History  Diagnosis Date  . Drug abuse   . Kidney failure   . Bipolar 1 disorder   . Rhabdomyolysis   . Acute renal failure   . Opiate abuse, continuous   . Dental caries     extensive  . Anxiety   . Depression   . Bipolar affective disorder 03/17/2012  . Mood disorder 03/16/2012   AXIS IV:  other psychosocial or environmental problems AXIS V:  61-70 mild symptoms  Plan Of Care/Follow-up recommendations:  Activity:  Increase as tolerated. Diet:  Regular Diet. Tests:  None. Other:  Follow up with [facility], and go to [facility].  Is patient on multiple antipsychotic therapies at discharge:  No   Has Patient had three or more failed trials of antipsychotic monotherapy by history:  No  Recommended Plan for Multiple Antipsychotic Therapies: NA  Leonidus Rowand 11/09/2012, 9:14 AM

## 2012-11-09 NOTE — Progress Notes (Signed)
Pt is discharged  He left with his belongings, medication samples and follow up information   He denies suicidal and homicidal ideation at present   He acknowledged understanding of discharge instructions

## 2012-11-09 NOTE — Discharge Summary (Signed)
Physician Discharge Summary Note  Patient:  Brandon [name] is an 47 y.o., male MRN:  [id number] DOB:  [birth year] Patient phone:  [phone] (home)  Patient address:   [address] [place] [address],   Date of Admission:  11/05/2012 Date of Discharge: 11/09/2012  Reason for Admission:  Intentional overdose  Discharge Diagnoses: Active Problems:   Bipolar affective disorder   Polysubstance dependence  Review of Systems  Constitutional: Negative.   HENT: Negative.   Eyes: Negative.   Respiratory: Negative.   Cardiovascular: Negative.   Gastrointestinal: Negative.   Genitourinary: Negative.   Musculoskeletal: Negative.   Skin: Negative.   Neurological: Negative.   Endo/Heme/Allergies: Negative.   Psychiatric/Behavioral: Positive for depression. The patient is nervous/anxious.     DSM5:  Substance/Addictive Disorders:  Opioid Disorder - Severe (304.00)  Axis Diagnosis:   AXIS I:  Anxiety Disorder NOS, Bipolar, Depressed and Substance Abuse AXIS II:  Deferred AXIS III:   Past Medical History  Diagnosis Date  . Drug abuse   . Kidney failure   . Bipolar 1 disorder   . Rhabdomyolysis   . Acute renal failure   . Opiate abuse, continuous   . Dental caries     extensive  . Anxiety   . Depression   . Bipolar affective disorder 03/17/2012  . Mood disorder 03/16/2012   AXIS IV:  other psychosocial or environmental problems, problems related to social environment and problems with primary support group AXIS V:  61-70 mild symptoms  Level of Care:  OP  Hospital Course:  On admission:  47 Y/o male who left 2 weeks ago. He went back to his parents house. Worked one day and after that has not done anything. Quit taking his medications. States that few days ago he took 24 ( 20 mg Adderall) in a 12 hours span. States he was wanting to get high not kill himself. In the ED he stated that he knew it could kill him. He stated then that the was feeling very depressed.  He has had no communication with his girlfriend. Not sure if this has something to do with it.   During hospitalization:  Medications managed--Geodon 120 mg at bedtime started for his bipolar d/o, Ambien 10 mg for sleep issues, Robaxin 750 mg every 6 H PRN muscle skeletal pain, Ativan 1 mg every 6 hours PRN anxiety, Cogentin 2 mg BID for EPS prevention.  Tymir attended and participated in therapy, symptoms dissipated and patient stabilized.  Patient denied suicidal/homicidal ideations and auditory/visual hallucinations, follow-up appointments encouraged to attend, outside support groups encouraged and information given, Rx and 2 week supply of medications.  Fremon is mentally and physically stable for discharge.  Consults:  None  Significant Diagnostic Studies:  labs: Completed in ED, reviewed, stable  Discharge Vitals:   Blood pressure 130/82, pulse 91, temperature 97.2 F (36.2 C), temperature source Oral, resp. rate 16. There is no weight on file to calculate BMI. Lab Results:   No results found for this or any previous visit (from the past 72 hour(s)).  Physical Findings: AIMS: Facial and Oral Movements Muscles of Facial Expression: None, normal Lips and Perioral Area: None, normal Jaw: None, normal Tongue: None, normal,Extremity Movements Upper (arms, wrists, hands, fingers): None, normal Lower (legs, knees, ankles, toes): None, normal, Trunk Movements Neck, shoulders, hips: None, normal, Overall Severity Severity of abnormal movements (highest score from questions above): None, normal Incapacitation due to abnormal movements: None, normal Patient's awareness of abnormal movements (rate  only patient's report): No Awareness, Dental Status Current problems with teeth and/or dentures?: Yes (Missing teeth) Does patient usually wear dentures?: No  CIWA:    COWS:     Psychiatric Specialty Exam: See Psychiatric Specialty Exam and Suicide Risk Assessment completed by Attending Physician  prior to discharge.  Discharge destination:  Home  Is patient on multiple antipsychotic therapies at discharge:  No   Has Patient had three or more failed trials of antipsychotic monotherapy by history:  No  Recommended Plan for Multiple Antipsychotic Therapies:  NA  Discharge Orders   Future Orders Complete By Expires   Activity as tolerated - No restrictions  As directed    Diet - low sodium heart healthy  As directed        Medication List       Indication   benztropine 2 MG tablet  Commonly known as:  COGENTIN  Take 1 tablet (2 mg total) by mouth 2 (two) times daily.   Indication:  Extrapyramidal Reaction caused by Medications     LORazepam 1 MG tablet  Commonly known as:  ATIVAN  Take 1 tablet (1 mg total) by mouth every 6 (six) hours as needed for anxiety (agitation).      methocarbamol 750 MG tablet  Commonly known as:  ROBAXIN  Take 1 tablet (750 mg total) by mouth every 6 (six) hours as needed.   Indication:  Musculoskeletal Pain     testosterone cypionate 100 MG/ML injection  Commonly known as:  DEPOTESTOTERONE CYPIONATE  Inject 1 mL (100 mg total) into the muscle every 28 (twenty-eight) days. For IM use only   Indication:  Deficient Activity of Testis or Ovary     ziprasidone 60 MG capsule  Commonly known as:  GEODON  Take 2 capsules (120 mg total) by mouth at bedtime.   Indication:  Manic-Depression     zolpidem 10 MG tablet  Commonly known as:  AMBIEN  Take 1 tablet (10 mg total) by mouth at bedtime as needed for sleep.   Indication:  Trouble Sleeping           Follow-up Information   Follow up with [facility] On 11/13/2012. (Arrive between 8:30AM-11AM for hospital followup/assessment for therapy services. Be sure to bring your discharge packet to this appt. )    Contact information:   [address] [place], [place] [address] Phone: [phone] Fax: [phone]      Follow-up recommendations:  Activity:  as tolerated Diet:  low-sodium  heart health diet  Comments:  Patient will continue his care at [facility]  Total Discharge Time:  Greater than 30 minutes.  SignedNanine Means, PMH-NP 11/09/2012, 9:02 AM  Jacqulyn Cane, M.D.  11/09/2012 11:27 PM

## 2012-11-09 NOTE — Progress Notes (Signed)
Did NOT attend GOALs group 

## 2012-11-13 NOTE — Progress Notes (Signed)
Patient Discharge Instructions:  After Visit Summary (AVS):   Faxed to:  11/13/12 Discharge Summary Note:   Faxed to:  11/13/12 Psychiatric Admission Assessment Note:   Faxed to:  11/13/12 Suicide Risk Assessment - Discharge Assessment:   Faxed to:  11/13/12 Faxed/Sent to the Next Level Care provider:  11/13/12 Faxed to [facility] @ [phone]  [name], 11/13/2012, 1:51 PM

## 2013-10-07 ENCOUNTER — Emergency Department (HOSPITAL_BASED_OUTPATIENT_CLINIC_OR_DEPARTMENT_OTHER)
Admission: EM | Admit: 2013-10-07 | Discharge: 2013-10-07 | Discharge status: Against Medical Advice | Payer: Self-pay | Attending: Emergency Medicine | Admitting: Emergency Medicine

## 2013-10-07 ENCOUNTER — Encounter (HOSPITAL_BASED_OUTPATIENT_CLINIC_OR_DEPARTMENT_OTHER): Payer: Self-pay | Admitting: Emergency Medicine

## 2013-10-07 DIAGNOSIS — Y9301 Activity, walking, marching and hiking: Secondary | ICD-10-CM | POA: Insufficient documentation

## 2013-10-07 DIAGNOSIS — G8929 Other chronic pain: Secondary | ICD-10-CM | POA: Insufficient documentation

## 2013-10-07 DIAGNOSIS — Z8739 Personal history of other diseases of the musculoskeletal system and connective tissue: Secondary | ICD-10-CM | POA: Insufficient documentation

## 2013-10-07 DIAGNOSIS — F3130 Bipolar disorder, current episode depressed, mild or moderate severity, unspecified: Secondary | ICD-10-CM | POA: Insufficient documentation

## 2013-10-07 DIAGNOSIS — S8990XA Unspecified injury of unspecified lower leg, initial encounter: Secondary | ICD-10-CM | POA: Insufficient documentation

## 2013-10-07 DIAGNOSIS — Z87448 Personal history of other diseases of urinary system: Secondary | ICD-10-CM | POA: Insufficient documentation

## 2013-10-07 DIAGNOSIS — Z8719 Personal history of other diseases of the digestive system: Secondary | ICD-10-CM | POA: Insufficient documentation

## 2013-10-07 DIAGNOSIS — S99919A Unspecified injury of unspecified ankle, initial encounter: Secondary | ICD-10-CM

## 2013-10-07 DIAGNOSIS — F411 Generalized anxiety disorder: Secondary | ICD-10-CM | POA: Insufficient documentation

## 2013-10-07 DIAGNOSIS — S99929A Unspecified injury of unspecified foot, initial encounter: Secondary | ICD-10-CM

## 2013-10-07 DIAGNOSIS — Y929 Unspecified place or not applicable: Secondary | ICD-10-CM | POA: Insufficient documentation

## 2013-10-07 DIAGNOSIS — Z79899 Other long term (current) drug therapy: Secondary | ICD-10-CM | POA: Insufficient documentation

## 2013-10-07 DIAGNOSIS — F1910 Other psychoactive substance abuse, uncomplicated: Secondary | ICD-10-CM | POA: Insufficient documentation

## 2013-10-07 DIAGNOSIS — R296 Repeated falls: Secondary | ICD-10-CM | POA: Insufficient documentation

## 2013-10-07 NOTE — ED Provider Notes (Signed)
CSN: [id number]     Arrival date & time 10/07/13  1855 History   First MD Initiated Contact with Patient 10/07/13 1949     Chief Complaint  Patient presents with  . Knee Pain     (Consider location/radiation/quality/duration/timing/severity/associated sxs/prior Treatment) Patient is a 48 y.o. male presenting with knee pain. The history is provided by the patient. No language interpreter was used.  Knee Pain Location:  Knee Knee location:  R knee Pt brought in by EMS for knee pain.   Pt reports he does not want evaluation or treatment.  Pt reports he has a history of substance abuse and he saw ex girlfriend and used drugs today.  Pt reports he snorted oxycodone and drank beer.  He reports he has chronic knee pain.    Past Medical History  Diagnosis Date  . Drug abuse   . Kidney failure   . Bipolar 1 disorder   . Rhabdomyolysis   . Acute renal failure   . Opiate abuse, continuous   . Dental caries     extensive  . Anxiety   . Depression   . Bipolar affective disorder 03/17/2012  . Mood disorder 03/16/2012   Past Surgical History  Procedure Laterality Date  . Knee surgery      x 5   Family History  Problem Relation Age of Onset  . Heart disease Mother   . Heart disease Father    History  Substance Use Topics  . Smoking status: Never Smoker   . Smokeless tobacco: Never Used  . Alcohol Use: No    Review of Systems  All other systems reviewed and are negative.     Allergies  Other  Home Medications   Prior to Admission medications   Medication Sig Start Date End Date Taking? Authorizing Provider  benztropine (COGENTIN) 2 MG tablet Take 1 tablet (2 mg total) by mouth 2 (two) times daily. 11/09/12   Nanine Means, NP  LORazepam (ATIVAN) 1 MG tablet Take 1 tablet (1 mg total) by mouth every 6 (six) hours as needed for anxiety (agitation). 11/09/12   Nanine Means, NP  methocarbamol (ROBAXIN) 750 MG tablet Take 1 tablet (750 mg total) by mouth every 6 (six) hours as  needed. 11/09/12   Nanine Means, NP  testosterone cypionate (DEPOTESTOTERONE CYPIONATE) 100 MG/ML injection Inject 1 mL (100 mg total) into the muscle every 28 (twenty-eight) days. For IM use only 11/09/12   Nanine Means, NP  ziprasidone (GEODON) 60 MG capsule Take 2 capsules (120 mg total) by mouth at bedtime. 11/09/12   Nanine Means, NP   BP 146/88  Pulse 85  Temp(Src) 97.9 F (36.6 C) (Oral)  Resp 22  Ht 6\' 3"  (1.905 m)  Wt 260 lb (117.935 kg)  BMI 32.50 kg/m2  SpO2 97% Physical Exam  Vitals reviewed. Constitutional: He is oriented to person, place, and time. He appears well-developed.  HENT:  Head: Normocephalic.  Neurological: He is alert and oriented to person, place, and time.  Skin: Skin is warm.    ED Course  Procedures (including critical care time) Labs Review Labs Reviewed - No data to display  Imaging Review No results found.   EKG Interpretation None      MDM   Final diagnoses:  None    Pt called his Father.   He is comfortable carrying pt home.   Pt is awake and alert.  Pt able to make conversation.   Pt signed out AMA with Father  Lonia SkinnerLeslie K JosephvilleSofia, PA-C 10/07/13 2031

## 2013-10-07 NOTE — ED Provider Notes (Signed)
Medical screening examination/treatment/procedure(s) were performed by non-physician practitioner and as supervising physician I was immediately available for consultation/collaboration.   EKG Interpretation None       Martha K Linker, MD 10/07/13 2033 

## 2013-10-07 NOTE — ED Notes (Signed)
Pt states that he fell while walking and hurt his knee but he needs to go home so he can get to work. Pt on phone with father to arrange transport home. Pt alert and oriented x 4, NAD noted.

## 2013-10-07 NOTE — ED Notes (Signed)
Pt's father is here to pick him up, PA at bedside talking to father and pt about him leaving.  Father states he's okay taking him home and pt is still wanting to leave.

## 2013-10-07 NOTE — ED Notes (Signed)
Security at bs, pt irrate and demanding to leave. Pt again reminded that if he has transport home he can sign out AMA.

## 2013-10-07 NOTE — ED Notes (Signed)
Pt placed on heart monitor and nasal canula 2 lpm  99% oxygen

## 2013-10-07 NOTE — ED Notes (Signed)
Right knee pain.  EMS reports pt was reported as a missing person this am, PD has been searching for him today and was found lying on the sidewalk by a bystander.  Also reports pt was in woods all day with girlfriend, had an argument over snorting percocet.

## 2013-10-26 ENCOUNTER — Emergency Department (HOSPITAL_BASED_OUTPATIENT_CLINIC_OR_DEPARTMENT_OTHER): Payer: Self-pay

## 2013-10-26 ENCOUNTER — Inpatient Hospital Stay (HOSPITAL_BASED_OUTPATIENT_CLINIC_OR_DEPARTMENT_OTHER)
Admission: EM | Admit: 2013-10-26 | Discharge: 2013-11-04 | DRG: 918 | Disposition: A | Discharge status: Other Acute Inpatient Hospital | Payer: Self-pay | Attending: Internal Medicine | Admitting: Internal Medicine

## 2013-10-26 ENCOUNTER — Encounter (HOSPITAL_BASED_OUTPATIENT_CLINIC_OR_DEPARTMENT_OTHER): Payer: Self-pay | Admitting: Emergency Medicine

## 2013-10-26 DIAGNOSIS — N179 Acute kidney failure, unspecified: Secondary | ICD-10-CM | POA: Diagnosis present

## 2013-10-26 DIAGNOSIS — F1310 Sedative, hypnotic or anxiolytic abuse, uncomplicated: Secondary | ICD-10-CM | POA: Diagnosis present

## 2013-10-26 DIAGNOSIS — F1920 Other psychoactive substance dependence, uncomplicated: Secondary | ICD-10-CM

## 2013-10-26 DIAGNOSIS — Z9119 Patient's noncompliance with other medical treatment and regimen: Secondary | ICD-10-CM

## 2013-10-26 DIAGNOSIS — R45851 Suicidal ideations: Secondary | ICD-10-CM

## 2013-10-26 DIAGNOSIS — F319 Bipolar disorder, unspecified: Secondary | ICD-10-CM | POA: Diagnosis present

## 2013-10-26 DIAGNOSIS — F1120 Opioid dependence, uncomplicated: Secondary | ICD-10-CM

## 2013-10-26 DIAGNOSIS — F1410 Cocaine abuse, uncomplicated: Secondary | ICD-10-CM | POA: Diagnosis present

## 2013-10-26 DIAGNOSIS — T43502A Poisoning by unspecified antipsychotics and neuroleptics, intentional self-harm, initial encounter: Secondary | ICD-10-CM | POA: Diagnosis present

## 2013-10-26 DIAGNOSIS — T50902A Poisoning by unspecified drugs, medicaments and biological substances, intentional self-harm, initial encounter: Secondary | ICD-10-CM

## 2013-10-26 DIAGNOSIS — R740 Nonspecific elevation of levels of transaminase and lactic acid dehydrogenase [LDH]: Secondary | ICD-10-CM

## 2013-10-26 DIAGNOSIS — R7401 Elevation of levels of liver transaminase levels: Secondary | ICD-10-CM | POA: Diagnosis present

## 2013-10-26 DIAGNOSIS — Z79899 Other long term (current) drug therapy: Secondary | ICD-10-CM

## 2013-10-26 DIAGNOSIS — T43624A Poisoning by amphetamines, undetermined, initial encounter: Principal | ICD-10-CM | POA: Diagnosis present

## 2013-10-26 DIAGNOSIS — M6282 Rhabdomyolysis: Secondary | ICD-10-CM | POA: Diagnosis present

## 2013-10-26 DIAGNOSIS — S83206A Unspecified tear of unspecified meniscus, current injury, right knee, initial encounter: Secondary | ICD-10-CM | POA: Diagnosis present

## 2013-10-26 DIAGNOSIS — T1491XA Suicide attempt, initial encounter: Secondary | ICD-10-CM

## 2013-10-26 DIAGNOSIS — Z91199 Patient's noncompliance with other medical treatment and regimen due to unspecified reason: Secondary | ICD-10-CM

## 2013-10-26 DIAGNOSIS — R7402 Elevation of levels of lactic acid dehydrogenase (LDH): Secondary | ICD-10-CM | POA: Diagnosis present

## 2013-10-26 DIAGNOSIS — T438X2A Poisoning by other psychotropic drugs, intentional self-harm, initial encounter: Secondary | ICD-10-CM | POA: Diagnosis present

## 2013-10-26 DIAGNOSIS — Z5900 Homelessness unspecified: Secondary | ICD-10-CM

## 2013-10-26 DIAGNOSIS — F1110 Opioid abuse, uncomplicated: Secondary | ICD-10-CM | POA: Diagnosis present

## 2013-10-26 DIAGNOSIS — E860 Dehydration: Secondary | ICD-10-CM | POA: Diagnosis present

## 2013-10-26 DIAGNOSIS — T43622A Poisoning by amphetamines, intentional self-harm, initial encounter: Secondary | ICD-10-CM

## 2013-10-26 DIAGNOSIS — F1510 Other stimulant abuse, uncomplicated: Secondary | ICD-10-CM | POA: Diagnosis present

## 2013-10-26 DIAGNOSIS — D649 Anemia, unspecified: Secondary | ICD-10-CM

## 2013-10-26 DIAGNOSIS — T43621A Poisoning by amphetamines, accidental (unintentional), initial encounter: Secondary | ICD-10-CM | POA: Diagnosis present

## 2013-10-26 DIAGNOSIS — X500XXA Overexertion from strenuous movement or load, initial encounter: Secondary | ICD-10-CM | POA: Diagnosis present

## 2013-10-26 DIAGNOSIS — F3164 Bipolar disorder, current episode mixed, severe, with psychotic features: Secondary | ICD-10-CM

## 2013-10-26 DIAGNOSIS — K3184 Gastroparesis: Secondary | ICD-10-CM

## 2013-10-26 DIAGNOSIS — F39 Unspecified mood [affective] disorder: Secondary | ICD-10-CM

## 2013-10-26 DIAGNOSIS — IMO0002 Reserved for concepts with insufficient information to code with codable children: Secondary | ICD-10-CM | POA: Diagnosis present

## 2013-10-26 DIAGNOSIS — M25561 Pain in right knee: Secondary | ICD-10-CM

## 2013-10-26 DIAGNOSIS — F1910 Other psychoactive substance abuse, uncomplicated: Secondary | ICD-10-CM | POA: Diagnosis present

## 2013-10-26 LAB — COMPREHENSIVE METABOLIC PANEL
ALBUMIN: 4 g/dL (ref 3.5–5.2)
ALK PHOS: 56 U/L (ref 39–117)
ALT: 58 U/L — AB (ref 0–53)
ANION GAP: 21 — AB (ref 5–15)
AST: 109 U/L — AB (ref 0–37)
BILIRUBIN TOTAL: 1.5 mg/dL — AB (ref 0.3–1.2)
BUN: 31 mg/dL — ABNORMAL HIGH (ref 6–23)
CHLORIDE: 95 meq/L — AB (ref 96–112)
CO2: 21 mEq/L (ref 19–32)
Calcium: 9.6 mg/dL (ref 8.4–10.5)
Creatinine, Ser: 1.1 mg/dL (ref 0.50–1.35)
GFR calc Af Amer: >90 mL/min (ref >90)
GFR calc non Af Amer: 78 mL/min — ABNORMAL LOW (ref >90)
Glucose, Bld: 139 mg/dL — ABNORMAL HIGH (ref 70–99)
POTASSIUM: 4.2 meq/L (ref 3.7–5.3)
SODIUM: 137 meq/L (ref 137–147)
TOTAL PROTEIN: 7.7 g/dL (ref 6.0–8.3)

## 2013-10-26 LAB — RAPID URINE DRUG SCREEN, HOSP PERFORMED
Amphetamines: POSITIVE — AB
BARBITURATES: NOT DETECTED
Benzodiazepines: NOT DETECTED
Cocaine: NOT DETECTED
Opiates: NOT DETECTED
Tetrahydrocannabinol: NOT DETECTED

## 2013-10-26 LAB — CBC WITH DIFFERENTIAL/PLATELET
Basophils Absolute: 0 10*3/uL (ref 0.0–0.1)
Basophils Relative: 0 % (ref 0–1)
EOS PCT: 1 % (ref 0–5)
Eosinophils Absolute: 0.1 10*3/uL (ref 0.0–0.7)
HEMATOCRIT: 39.4 % (ref 39.0–52.0)
HEMOGLOBIN: 13.8 g/dL (ref 13.0–17.0)
LYMPHS ABS: 1.4 10*3/uL (ref 0.7–4.0)
LYMPHS PCT: 11 % — AB (ref 12–46)
MCH: 31.1 pg (ref 26.0–34.0)
MCHC: 35 g/dL (ref 30.0–36.0)
MCV: 88.7 fL (ref 78.0–100.0)
MONOS PCT: 8 % (ref 3–12)
Monocytes Absolute: 1 10*3/uL (ref 0.1–1.0)
Neutro Abs: 10.1 10*3/uL — ABNORMAL HIGH (ref 1.7–7.7)
Neutrophils Relative %: 80 % — ABNORMAL HIGH (ref 43–77)
PLATELETS: 340 10*3/uL (ref 150–400)
RBC: 4.44 MIL/uL (ref 4.22–5.81)
RDW: 12.3 % (ref 11.5–15.5)
WBC: 12.5 10*3/uL — AB (ref 4.0–10.5)

## 2013-10-26 LAB — SALICYLATE LEVEL

## 2013-10-26 LAB — ACETAMINOPHEN LEVEL

## 2013-10-26 LAB — CK
CK TOTAL: 1695 U/L — AB (ref 7–232)
Total CK: 2363 U/L — ABNORMAL HIGH (ref 7–232)
Total CK: 1672 U/L — ABNORMAL HIGH (ref 7–232)

## 2013-10-26 LAB — TROPONIN I: Troponin I: <0.3 ng/mL (ref <0.30)

## 2013-10-26 LAB — ETHANOL

## 2013-10-26 MED ORDER — SODIUM CHLORIDE 0.9 % IV SOLN
Freq: Once | INTRAVENOUS | Status: AC
  Administered 2013-10-26: 21:00:00 via INTRAVENOUS

## 2013-10-26 MED ORDER — SODIUM CHLORIDE 0.9 % IV BOLUS (SEPSIS)
1000.0000 mL | Freq: Once | INTRAVENOUS | Status: AC
  Administered 2013-10-26: 1000 mL via INTRAVENOUS

## 2013-10-26 MED ORDER — LORAZEPAM 2 MG/ML IJ SOLN
2.0000 mg | Freq: Once | INTRAMUSCULAR | Status: AC
  Administered 2013-10-26: 2 mg via INTRAVENOUS
  Filled 2013-10-26: qty 1

## 2013-10-26 MED ORDER — NALOXONE HCL 0.4 MG/ML IJ SOLN
INTRAMUSCULAR | Status: AC
  Administered 2013-10-26: 0.4 mg via INTRAVENOUS
  Filled 2013-10-26: qty 1

## 2013-10-26 MED ORDER — LORAZEPAM 2 MG/ML IJ SOLN
2.0000 mg | Freq: Once | INTRAMUSCULAR | Status: AC
  Administered 2013-10-26: 2 mg via INTRAMUSCULAR
  Filled 2013-10-26: qty 1

## 2013-10-26 MED ORDER — HALOPERIDOL LACTATE 5 MG/ML IJ SOLN
5.0000 mg | Freq: Once | INTRAMUSCULAR | Status: AC
  Administered 2013-10-26: 5 mg via INTRAMUSCULAR
  Filled 2013-10-26: qty 1

## 2013-10-26 MED ORDER — ZIPRASIDONE MESYLATE 20 MG IM SOLR
20.0000 mg | Freq: Four times a day (QID) | INTRAMUSCULAR | Status: DC | PRN
  Administered 2013-10-26: 20 mg via INTRAMUSCULAR
  Filled 2013-10-26 (×3): qty 20

## 2013-10-26 MED ORDER — SODIUM CHLORIDE 0.9 % IV SOLN
Freq: Once | INTRAVENOUS | Status: AC
  Administered 2013-10-26: 14:00:00 via INTRAVENOUS

## 2013-10-26 MED ORDER — SODIUM CHLORIDE 0.9 % IV SOLN
Freq: Once | INTRAVENOUS | Status: AC
  Administered 2013-10-26: 22:00:00 via INTRAVENOUS

## 2013-10-26 NOTE — ED Notes (Signed)
MD back at bedside, patient remains to sleepy for TTS evaluation

## 2013-10-26 NOTE — ED Notes (Signed)
Pt remains obtunded and unable to sign consent to transfer but pt is being sent to an inpatient facility that has available space and necessary staff and equipment to manage Mr Brandon [name] medical and psychatric needs have been confirmed

## 2013-10-26 NOTE — ED Notes (Signed)
Attempted to transfer patient to [facility] ED for face to face assessment for TTS. Due to sleepiness of patient transfer delayed.

## 2013-10-26 NOTE — ED Notes (Signed)
Call from [place] at TTS pt not alert enough to participate in tele-psych screening she request a call when pt is more alert at [phone]

## 2013-10-26 NOTE — ED Notes (Addendum)
Spoke to [place] at [facility] and we are to call when patient awake and TTS can be completed.. Call [phone]

## 2013-10-26 NOTE — ED Notes (Addendum)
Call to 3S-13 bed is ready but nurse unable to take report at this time. Brandon [name] remains obtunded at this time and unable to sign for his transfer to [facility]. No family members currently present and phone call to [phone] home number gave recording that this number  is not accepting phone calls at this time.

## 2013-10-26 NOTE — ED Notes (Signed)
telepsych called to speak with patient. Patient unable to wake up and actively participate. Advised to get patient to [facility] if possible.

## 2013-10-26 NOTE — ED Notes (Signed)
Pt transported via [facility] to 3S -15 at [facility]

## 2013-10-26 NOTE — ED Notes (Signed)
Patient states that he took 60 adderall, crushes them and inhales them. Now he states that he probably inhaled 10 or 12, and also states that he then took an [place] to "calm down". Patient has a HR of 86 and BP 128/68. He has a history of abusing various substances. States that he is homeless and is seeing things and people that are not there. He is only wearing shorts and has cuts and scrapes on his feet and legs. States he was wondering around the streets debating on who he was going to rob in order to get more aderrall so that he could "finish the job". He was at a local pool and stated that "i probably would have killed the first person I saw, cause I was so high". States that his kidneys and his heart are hurting.

## 2013-10-26 NOTE — ED Provider Notes (Addendum)
Patient too sedated for telepsych assessment after haldol, benadryl. Psych feels patient needs face-to-face assessment at [facility].  Psych unable to fully assess patient because he was too altered. At bedside patient makes mumbling noises, will follow commands intermittently. He is not waking up vigorously. Has pinpoint pupils, no response to Narcan. Repeat temperature is normal. Repeat CK not trending down as quickly. Head CT normal. Admitted to step down Uncertain for continued altered mental status.  1. Drug overdose, intentional self-harm, initial encounter      Elwin Mocha, MD 10/26/13 1616  Elwin Mocha, MD 10/26/13 [phone]

## 2013-10-26 NOTE — ED Provider Notes (Signed)
CSN: [id number]     Arrival date & time 10/26/13  1235 History   First MD Initiated Contact with Patient 10/26/13 1241     Chief Complaint  Patient presents with  . Drug Overdose      HPI  Patient presents after being dropped off by his father. Apparently has been hallucinating at home. States that he was in rehabilitation until a few weeks ago. He states yesterday a friend gave him $300 to go by some Adderall for him". Athony states that he consumed on Adderall yesterday. At some points he states there is some points he states 60. He was home. Describes his hallucinating.[place] like someone is chasing him. States that he was running through a field to escape from them. He has a conversation with someone that is not there but doesn't feel the voices are his head.  States that he thought he was "at my wits end". States that he felt like he would find somebody and would "assault them to steal the money to buy more drugs".  States she is able to require more drugs he thought he would have overdosed a "just finish the job".   Past Medical History  Diagnosis Date  . Drug abuse   . Kidney failure   . Bipolar 1 disorder   . Rhabdomyolysis   . Acute renal failure   . Opiate abuse, continuous   . Dental caries     extensive  . Anxiety   . Depression   . Bipolar affective disorder 03/17/2012  . Mood disorder 03/16/2012   Past Surgical History  Procedure Laterality Date  . Knee surgery      x 5   Family History  Problem Relation Age of Onset  . Heart disease Mother   . Heart disease Father    History  Substance Use Topics  . Smoking status: Never Smoker   . Smokeless tobacco: Never Used  . Alcohol Use: No    Review of Systems  Constitutional: Positive for diaphoresis and fatigue. Negative for fever.  Cardiovascular: Positive for palpitations. Negative for chest pain.  Gastrointestinal: Negative for abdominal pain.  Endocrine: Positive for polydipsia. Negative for polyuria.   Genitourinary: Negative for dysuria.  Musculoskeletal: Positive for myalgias.  Skin:       Abrasions on his legs from "running in the woods".  Neurological: Negative for dizziness and headaches.  Psychiatric/Behavioral: Positive for suicidal ideas, behavioral problems, sleep disturbance, dysphoric mood and agitation. The patient is nervous/anxious and is hyperactive.       Allergies  Other  Home Medications   Prior to Admission medications   Medication Sig Start Date End Date Taking? Authorizing Provider  benztropine (COGENTIN) 2 MG tablet Take 1 tablet (2 mg total) by mouth 2 (two) times daily. 11/09/12   Nanine Means, NP  LORazepam (ATIVAN) 1 MG tablet Take 1 tablet (1 mg total) by mouth every 6 (six) hours as needed for anxiety (agitation). 11/09/12   Nanine Means, NP  methocarbamol (ROBAXIN) 750 MG tablet Take 1 tablet (750 mg total) by mouth every 6 (six) hours as needed. 11/09/12   Nanine Means, NP  testosterone cypionate (DEPOTESTOTERONE CYPIONATE) 100 MG/ML injection Inject 1 mL (100 mg total) into the muscle every 28 (twenty-eight) days. For IM use only 11/09/12   Nanine Means, NP  ziprasidone (GEODON) 60 MG capsule Take 2 capsules (120 mg total) by mouth at bedtime. 11/09/12   Nanine Means, NP   BP 127/70  Pulse 82  Resp  10  SpO2 100% Physical Exam  Constitutional: He is oriented to person, place, and time. He appears well-developed and well-nourished. No distress.  HENT:  Head: Normocephalic.  Eyes: Conjunctivae are normal. Pupils are equal, round, and reactive to light. No scleral icterus.  Neck: Normal range of motion. Neck supple. No thyromegaly present.  Cardiovascular: Normal rate and regular rhythm.  Exam reveals no gallop and no friction rub.   No murmur heard. Pulmonary/Chest: Effort normal and breath sounds normal. No respiratory distress. He has no wheezes. He has no rales.  Abdominal: Soft. Bowel sounds are normal. He exhibits no distension. There is no  tenderness. There is no rebound.  Musculoskeletal: Normal range of motion.  Neurological: He is alert and oriented to person, place, and time.  Skin: Skin is warm and dry. No rash noted.  Multiple superficial abrasion to the lower extremities.  Psychiatric: His mood appears anxious. His affect is blunt and labile. His speech is rapid and/or pressured, tangential and slurred. He is agitated and hyperactive. Thought content is paranoid and delusional. He expresses impulsivity and inappropriate judgment. He expresses homicidal and suicidal ideation. He expresses suicidal plans. He is inattentive.    ED Course  Procedures (including critical care time) Labs Review Labs Reviewed  URINE RAPID DRUG SCREEN (HOSP PERFORMED) - Abnormal; Notable for the following:    Amphetamines POSITIVE (*)    All other components within normal limits  SALICYLATE LEVEL - Abnormal; Notable for the following:    Salicylate Lvl <2.0 (*)    All other components within normal limits  COMPREHENSIVE METABOLIC PANEL - Abnormal; Notable for the following:    Chloride 95 (*)    Glucose, Bld 139 (*)    BUN 31 (*)    AST 109 (*)    ALT 58 (*)    Total Bilirubin 1.5 (*)    GFR calc non Af Amer 78 (*)    Anion gap 21 (*)    All other components within normal limits  CK - Abnormal; Notable for the following:    Total CK 2363 (*)    All other components within normal limits  CBC WITH DIFFERENTIAL - Abnormal; Notable for the following:    WBC 12.5 (*)    Neutrophils Relative % 80 (*)    Neutro Abs 10.1 (*)    Lymphocytes Relative 11 (*)    All other components within normal limits  ETHANOL  ACETAMINOPHEN LEVEL  TROPONIN I  CK    Imaging Review No results found.   EKG Interpretation None      MDM   Final diagnoses:  None    No vital sign abnormalities to suggest amphetamine intoxication/  Heis not hypertensive tachycardic. Is more calm after IV Ativan, and IM Geodon.  Normal toxicology, except for  amphetamines. CPK minimally elevated at 2363. He is being hydrated. Had 2 L of fluid.  [facility] TTS evaluation. I think he will need inpatient psychiatric care. Do not think he needs acute medical management other than continuing his IV fluids at this time until his psychiatric evaluation is complete.     Rolland Porter, MD 10/26/13 [phone]

## 2013-10-26 NOTE — ED Notes (Signed)
[facility] called for needed sitter

## 2013-10-26 NOTE — BH Assessment (Signed)
Pt remains too sedated to be assessed at this time, and is continuing to have some medical issues per RN. Nurse will contact TTS when pt is alert enough for assessment. Estimated this could take an hour or more.   TA to commence when pt is alert.    Clista Bernhardt, [facility] Triage Specialist 10/26/2013 7:22 PM

## 2013-10-26 NOTE — BH Assessment (Addendum)
[facility] Assessment Progress Note   Update:  Attempted to see pt @ 1600 via tele assessment, but he could not be roused, even with two nurses trying to rouse him to be assessed.  Per Thurman Coyer, [facility], pt can be transferred to [facility].  Pt to be assessed when he is alert and oriented.  He was given Haldol and Ativan per ED nurses.  Updated ED and TTS staff.  Called and spoke with EDP Fayrene Fearing @ 1540 to gather clinical information and pt's tele assessment scheduled with this clinician @ 1600.  Casimer Lanius, MS, [facility] Licensed Professional Counselor Triage Specialist

## 2013-10-27 ENCOUNTER — Encounter (HOSPITAL_COMMUNITY): Payer: Self-pay | Admitting: Internal Medicine

## 2013-10-27 DIAGNOSIS — T43624A Poisoning by amphetamines, undetermined, initial encounter: Principal | ICD-10-CM

## 2013-10-27 DIAGNOSIS — R740 Nonspecific elevation of levels of transaminase and lactic acid dehydrogenase [LDH]: Secondary | ICD-10-CM

## 2013-10-27 DIAGNOSIS — R7402 Elevation of levels of lactic acid dehydrogenase (LDH): Secondary | ICD-10-CM

## 2013-10-27 DIAGNOSIS — T50901A Poisoning by unspecified drugs, medicaments and biological substances, accidental (unintentional), initial encounter: Secondary | ICD-10-CM

## 2013-10-27 DIAGNOSIS — M6282 Rhabdomyolysis: Secondary | ICD-10-CM | POA: Diagnosis present

## 2013-10-27 DIAGNOSIS — T43591A Poisoning by other antipsychotics and neuroleptics, accidental (unintentional), initial encounter: Secondary | ICD-10-CM

## 2013-10-27 DIAGNOSIS — F3164 Bipolar disorder, current episode mixed, severe, with psychotic features: Secondary | ICD-10-CM

## 2013-10-27 DIAGNOSIS — T50902A Poisoning by unspecified drugs, medicaments and biological substances, intentional self-harm, initial encounter: Secondary | ICD-10-CM

## 2013-10-27 DIAGNOSIS — E860 Dehydration: Secondary | ICD-10-CM

## 2013-10-27 LAB — CBC
HCT: 33.7 % — ABNORMAL LOW (ref 39.0–52.0)
Hemoglobin: 11.9 g/dL — ABNORMAL LOW (ref 13.0–17.0)
MCH: 31.2 pg (ref 26.0–34.0)
MCHC: 35.3 g/dL (ref 30.0–36.0)
MCV: 88.5 fL (ref 78.0–100.0)
Platelets: 257 10*3/uL (ref 150–400)
RBC: 3.81 MIL/uL — ABNORMAL LOW (ref 4.22–5.81)
RDW: 12.7 % (ref 11.5–15.5)
WBC: 5.9 10*3/uL (ref 4.0–10.5)

## 2013-10-27 LAB — COMPREHENSIVE METABOLIC PANEL
ALK PHOS: 41 U/L (ref 39–117)
ALT: 40 U/L (ref 0–53)
ANION GAP: 13 (ref 5–15)
AST: 57 U/L — ABNORMAL HIGH (ref 0–37)
Albumin: 2.9 g/dL — ABNORMAL LOW (ref 3.5–5.2)
BILIRUBIN TOTAL: 0.7 mg/dL (ref 0.3–1.2)
BUN: 17 mg/dL (ref 6–23)
CHLORIDE: 108 meq/L (ref 96–112)
CO2: 20 mEq/L (ref 19–32)
Calcium: 7.7 mg/dL — ABNORMAL LOW (ref 8.4–10.5)
Creatinine, Ser: 0.8 mg/dL (ref 0.50–1.35)
GLUCOSE: 75 mg/dL (ref 70–99)
Potassium: 3.8 mEq/L (ref 3.7–5.3)
Sodium: 141 mEq/L (ref 137–147)
Total Protein: 5.6 g/dL — ABNORMAL LOW (ref 6.0–8.3)

## 2013-10-27 LAB — HEPATITIS PANEL, ACUTE
HCV Ab: NEGATIVE
HEP A IGM: NONREACTIVE
HEP B C IGM: NONREACTIVE
HEP B S AG: NEGATIVE

## 2013-10-27 LAB — PROTIME-INR
INR: 1.18 (ref 0.00–1.49)
Prothrombin Time: 15 seconds (ref 11.6–15.2)

## 2013-10-27 LAB — PHOSPHORUS: Phosphorus: 2 mg/dL — ABNORMAL LOW (ref 2.3–4.6)

## 2013-10-27 LAB — MAGNESIUM: Magnesium: 2 mg/dL (ref 1.5–2.5)

## 2013-10-27 LAB — TSH: TSH: 0.953 u[IU]/mL (ref 0.350–4.500)

## 2013-10-27 LAB — MRSA PCR SCREENING: MRSA by PCR: POSITIVE — AB

## 2013-10-27 LAB — CK: Total CK: 1043 U/L — ABNORMAL HIGH (ref 7–232)

## 2013-10-27 MED ORDER — TRAMADOL HCL 50 MG PO TABS
50.0000 mg | ORAL_TABLET | Freq: Four times a day (QID) | ORAL | Status: DC | PRN
  Administered 2013-10-28 – 2013-10-29 (×2): 50 mg via ORAL
  Filled 2013-10-27 (×2): qty 1

## 2013-10-27 MED ORDER — PANTOPRAZOLE SODIUM 40 MG IV SOLR
40.0000 mg | Freq: Every day | INTRAVENOUS | Status: DC
  Administered 2013-10-27: 40 mg via INTRAVENOUS
  Filled 2013-10-27 (×2): qty 40

## 2013-10-27 MED ORDER — ZIPRASIDONE HCL 60 MG PO CAPS
120.0000 mg | ORAL_CAPSULE | Freq: Every day | ORAL | Status: DC
Start: 2013-10-27 — End: 2013-11-04
  Administered 2013-10-27 – 2013-11-03 (×8): 120 mg via ORAL
  Filled 2013-10-27 (×11): qty 2

## 2013-10-27 MED ORDER — FOLIC ACID 5 MG/ML IJ SOLN
1.0000 mg | Freq: Every day | INTRAMUSCULAR | Status: DC
  Filled 2013-10-27 (×2): qty 0.2

## 2013-10-27 MED ORDER — VITAMIN B-1 100 MG PO TABS
100.0000 mg | ORAL_TABLET | Freq: Every day | ORAL | Status: DC
  Administered 2013-10-27 – 2013-11-04 (×9): 100 mg via ORAL
  Filled 2013-10-27 (×9): qty 1

## 2013-10-27 MED ORDER — SODIUM CHLORIDE 0.9 % IJ SOLN: 3.0000 mL | Freq: Two times a day (BID) | INTRAMUSCULAR | Status: DC

## 2013-10-27 MED ORDER — THIAMINE HCL 100 MG/ML IJ SOLN
100.0000 mg | Freq: Every day | INTRAMUSCULAR | Status: DC
  Administered 2013-10-27: 100 mg via INTRAVENOUS
  Filled 2013-10-27: qty 1

## 2013-10-27 MED ORDER — ACETAMINOPHEN 650 MG RE SUPP
650.0000 mg | Freq: Four times a day (QID) | RECTAL | Status: DC | PRN
Start: 2013-10-27 — End: 2013-11-04

## 2013-10-27 MED ORDER — FOLIC ACID 1 MG PO TABS
1.0000 mg | ORAL_TABLET | Freq: Every day | ORAL | Status: DC
  Administered 2013-10-27 – 2013-11-04 (×9): 1 mg via ORAL
  Filled 2013-10-27 (×9): qty 1

## 2013-10-27 MED ORDER — BENZTROPINE MESYLATE 2 MG PO TABS
2.0000 mg | ORAL_TABLET | Freq: Two times a day (BID) | ORAL | Status: DC
  Administered 2013-10-27 – 2013-11-04 (×17): 2 mg via ORAL
  Filled 2013-10-27 (×19): qty 1

## 2013-10-27 MED ORDER — LORAZEPAM 1 MG PO TABS
1.0000 mg | ORAL_TABLET | Freq: Four times a day (QID) | ORAL | Status: DC | PRN
  Administered 2013-10-28 – 2013-11-04 (×19): 1 mg via ORAL
  Filled 2013-10-27 (×21): qty 1

## 2013-10-27 MED ORDER — ONDANSETRON HCL 4 MG/2ML IJ SOLN: 4.0000 mg | Freq: Four times a day (QID) | INTRAMUSCULAR | Status: DC | PRN

## 2013-10-27 MED ORDER — CHLORHEXIDINE GLUCONATE CLOTH 2 % EX PADS
6.0000 | MEDICATED_PAD | Freq: Every day | CUTANEOUS | Status: AC
  Administered 2013-10-27 – 2013-10-31 (×5): 6 via TOPICAL

## 2013-10-27 MED ORDER — ACETAMINOPHEN 325 MG PO TABS
650.0000 mg | ORAL_TABLET | Freq: Four times a day (QID) | ORAL | Status: DC | PRN
Start: 2013-10-27 — End: 2013-11-04

## 2013-10-27 MED ORDER — SODIUM CHLORIDE 0.9 % IV SOLN
Freq: Once | INTRAVENOUS | Status: AC
  Administered 2013-10-27: 1000 mL via INTRAVENOUS

## 2013-10-27 MED ORDER — ENOXAPARIN SODIUM 40 MG/0.4ML ~~LOC~~ SOLN
40.0000 mg | SUBCUTANEOUS | Status: DC
  Administered 2013-10-27 – 2013-11-04 (×9): 40 mg via SUBCUTANEOUS
  Filled 2013-10-27 (×9): qty 0.4

## 2013-10-27 MED ORDER — MUPIROCIN 2 % EX OINT
1.0000 "application " | TOPICAL_OINTMENT | Freq: Two times a day (BID) | CUTANEOUS | Status: AC
  Administered 2013-10-27 – 2013-10-31 (×9): 1 via NASAL
  Filled 2013-10-27 (×3): qty 22

## 2013-10-27 MED ORDER — DOCUSATE SODIUM 100 MG PO CAPS
100.0000 mg | ORAL_CAPSULE | Freq: Two times a day (BID) | ORAL | Status: DC
  Administered 2013-10-27 – 2013-11-04 (×12): 100 mg via ORAL
  Filled 2013-10-27 (×19): qty 1

## 2013-10-27 MED ORDER — SODIUM CHLORIDE 0.9 % IV SOLN
INTRAVENOUS | Status: DC
  Administered 2013-10-27 – 2013-10-28 (×2): via INTRAVENOUS
  Administered 2013-10-28: 1000 mL via INTRAVENOUS
  Administered 2013-10-29: 100 mL/h via INTRAVENOUS
  Administered 2013-10-29: 1000 mL via INTRAVENOUS

## 2013-10-27 MED ORDER — LORAZEPAM 2 MG/ML IJ SOLN
2.0000 mg | INTRAMUSCULAR | Status: DC | PRN
  Administered 2013-10-27 – 2013-10-28 (×2): 2 mg via INTRAVENOUS
  Filled 2013-10-27 (×3): qty 1

## 2013-10-27 MED ORDER — ONDANSETRON HCL 4 MG PO TABS: 4.0000 mg | ORAL_TABLET | Freq: Four times a day (QID) | ORAL | Status: DC | PRN

## 2013-10-27 NOTE — Progress Notes (Signed)
Utilization review completed.  

## 2013-10-27 NOTE — Progress Notes (Signed)
[place] TEAM 1 - Stepdown/ICU TEAM Progress Note  Brandon [name] ZOX:[id number] DOB: [birth year] DOA: 10/26/2013 PCP: No primary provider on file.  Admit HPI / Brief Narrative: 48 y.o. male w/ a past medical history of Drug abuse; Kidney failure; Bipolar 1 disorder; Rhabdomyolysis; Opiate abuse; Dental caries; Anxiety; Depression; Bipolar affective disorder; and Mood disorder w/ a long hx of substance abuse with multiple admissions. He reportedly was snorting adderall to get high.  Unknown amount was used.  Later he took some ambien to calm himself down. Reportedly he was found wandering the streets actively hallucinating. He was stating that he wanted to "rob someone to get more Adderall to finish the job".  Reportedly said he "would have killed anyone who he saw". Reported being homeless. Patient endorsed drinking unknown amount of alcohol.   He was first evaluated at [facility]. Patient was combative and was given Ativan IV and Geodone 20 mg IV.  He became somnolent and could not cooperate. Patient was transferred to [facility].  In the ER it was noted his CK was elevated at 2363 -> 1695 .   HPI/Subjective: Pt hx reviewed for f/u visit  Assessment/Plan:  Polysubstance abuse w/ amphetamine OD Psychiatry consult once patient is awake  Bipolar  Rhabdo  Dehydration  Transaminitis   Acute renal failure  Code Status: FULL Family Communication:  Disposition Plan: SDU  Consultants: none  Procedures: none  Antibiotics: none  DVT prophylaxis: SCDs  Objective: Blood pressure 124/71, pulse 76, temperature 98 F (36.7 C), temperature source Oral, resp. rate 12, height  (1.905 m), weight 105.2 kg (231 lb 14.8 oz), SpO2 100.00%.  Intake/Output Summary (Last 24 hours) at 10/27/13 1817 Last data filed at 10/27/13 1121  Gross per 24 hour  Intake    810 ml  Output   1200 ml  Net   -390 ml   Exam: No exam today - no charge for this visit/time  Data Reviewed: Basic Metabolic  Panel:  Recent Labs Lab 10/26/13 1235 10/27/13 0315  NA 137 141  K 4.2 3.8  CL 95* 108  CO2 21 20  GLUCOSE 139* 75  BUN 31* 17  CREATININE 1.10 0.80  CALCIUM 9.6 7.7*  MG  --  2.0  PHOS  --  2.0*   Liver Function Tests:  Recent Labs Lab 10/26/13 1235 10/27/13 0315  AST 109* 57*  ALT 58* 40  ALKPHOS 56 41  BILITOT 1.5* 0.7  PROT 7.7 5.6*  ALBUMIN 4.0 2.9*   Coags:  Recent Labs Lab 10/27/13 0315  INR 1.18   CBC:  Recent Labs Lab 10/26/13 1235 10/27/13 0315  WBC 12.5* 5.9  NEUTROABS 10.1*  --   HGB 13.8 11.9*  HCT 39.4 33.7*  MCV 88.7 88.5  PLT 340 257   Cardiac Enzymes:  Recent Labs Lab 10/26/13 1235 10/26/13 1541 10/26/13 1935 10/27/13 0315  CKTOTAL 2363* 1695* 1672* 1043*  TROPONINI <0.30  --   --   --     Recent Results (from the past 240 hour(s))  MRSA PCR SCREENING     Status: Abnormal   Collection Time    10/27/13 12:28 AM      Result Value Ref Range Status   MRSA by PCR POSITIVE (*) NEGATIVE Final   Comment:            The GeneXpert MRSA Assay (FDA     approved for NASAL specimens     only), is one component of a  comprehensive MRSA colonization     surveillance program. It is not     intended to diagnose MRSA     infection nor to guide or     monitor treatment for     MRSA infections.     RESULT CALLED TO, READ BACK BY AND VERIFIED WITH:     [facility] RN [id number] AT 0449 SKEEN,P    Studies:  Recent x-ray studies have been reviewed in detail by the Attending Physician  Scheduled Meds:  Scheduled Meds: . benztropine  2 mg Oral BID  . Chlorhexidine Gluconate Cloth  6 each Topical Q0600  . docusate sodium  100 mg Oral BID  . enoxaparin (LOVENOX) injection  40 mg Subcutaneous Q24H  . folic acid  1 mg Intravenous Daily  . mupirocin ointment  1 application Nasal BID  . pantoprazole (PROTONIX) IV  40 mg Intravenous QHS  . sodium chloride  3 mL Intravenous Q12H  . thiamine  100 mg Intravenous Daily  . ziprasidone  120 mg  Oral QHS    Time spent on care of this patient: 25+ mins   MCCLUNG,JEFFREY T , MD   [facility] Office  [phone] Pager - Text Page per Loretha Stapler as per below:  On-Call/Text Page:      [email or web address]      password [email or web address]  If 7PM-7AM, please contact night-coverage [email or web address] Password [facility] 10/27/2013, 6:17 PM   LOS: 1 day

## 2013-10-27 NOTE — H&P (Signed)
PCP:  none   Chief Complaint:  Seeing things  HPI: Brandon [name] is a 48 y.o. male   has a past medical history of Drug abuse; Kidney failure; Bipolar 1 disorder; Rhabdomyolysis; Acute renal failure; Opiate abuse, continuous; Dental caries; Anxiety; Depression; Bipolar affective disorder (03/17/2012); and Mood disorder (03/16/2012).   Pateitn has long hx of substance abuse with multiple admissions. He reportedly was snorting adderall to get high. Unknown amount was used later on he took some ambien to calm himself down. Reportedly he was found wandering the streets actively hallucinating. He was stating that he wanted to "rob someone to get more Adderall to finish the job" Reportedly said he "would have killed anyone who he saw". Reports being homeless. Patient endorses drinking unknown amount of alcohol. Denies withdrawal symptoms.  He was first evaluated at [facility]. Patient was combative and wad given Ativan IV and Geodone 20 mg IV.  He became somnolent and could not cooperate with TTS. Patient was transferred to [facility]. In ER it was noted his CK was elevated but have come down with IVF 2363 -> 1695 .   Hospitalist was called for admission for Dehydration, rhabdomyolysis, and oversedation  Review of Systems:  Unable to answer  Past Medical History: Past Medical History  Diagnosis Date  . Drug abuse   . Kidney failure   . Bipolar 1 disorder   . Rhabdomyolysis   . Acute renal failure   . Opiate abuse, continuous   . Dental caries     extensive  . Anxiety   . Depression   . Bipolar affective disorder 03/17/2012  . Mood disorder 03/16/2012   Past Surgical History  Procedure Laterality Date  . Knee surgery      x 5     Medications: Prior to Admission medications   Medication Sig Start Date End Date Taking? Authorizing Provider  benztropine (COGENTIN) 2 MG tablet Take 1 tablet (2 mg total) by mouth 2 (two) times daily. 11/09/12   Nanine Means, NP  LORazepam (ATIVAN) 1 MG tablet  Take 1 tablet (1 mg total) by mouth every 6 (six) hours as needed for anxiety (agitation). 11/09/12   Nanine Means, NP  methocarbamol (ROBAXIN) 750 MG tablet Take 1 tablet (750 mg total) by mouth every 6 (six) hours as needed. 11/09/12   Nanine Means, NP  testosterone cypionate (DEPOTESTOTERONE CYPIONATE) 100 MG/ML injection Inject 1 mL (100 mg total) into the muscle every 28 (twenty-eight) days. For IM use only 11/09/12   Nanine Means, NP  ziprasidone (GEODON) 60 MG capsule Take 2 capsules (120 mg total) by mouth at bedtime. 11/09/12   Nanine Means, NP    Allergies:   Allergies  Allergen Reactions  . Other Anaphylaxis    Lima, kidney and pork & beans     Social History:     reports that he has never smoked. He has never used smokeless tobacco. He reports that he uses illicit drugs (Oxycodone, Amphetamines, Barbituates, Cocaine, and Methamphetamines). He reports that he does not drink alcohol.    Family History: family history includes Heart disease in his father and mother.    Physical Exam: Patient Vitals for the past 24 hrs:  BP Temp Temp src Pulse Resp SpO2 Height Weight  10/27/13 0000 122/68 mmHg 97.5 F (36.4 C) Oral 75 - 100 %  (1.905 m) 105.2 kg (231 lb 14.8 oz)  10/26/13 2259 126/76 mmHg - - 77 - 99 % - -  10/26/13 2218 122/66 mmHg - -  71 - 98 % - -  10/26/13 2105 119/62 mmHg - - 71 - 96 % - -  10/26/13 2000 - - - 76 - 98 % - -  10/26/13 1934 135/77 mmHg - - 72 - 100 % - -  10/26/13 1932 - 97.9 F (36.6 C) Rectal - - - - -  10/26/13 1928 - - - - - 99 % - -  10/26/13 1915 129/85 mmHg - - 84 - 100 % - -  10/26/13 1909 125/75 mmHg - - 77 - 99 % - -  10/26/13 1906 123/71 mmHg - - 75 - 99 % - -  10/26/13 1903 127/67 mmHg - - 75 - 99 % - -  10/26/13 1900 122/68 mmHg - - 77 - 99 % - -  10/26/13 1854 121/75 mmHg - - 76 - 99 % - -  10/26/13 1845 126/76 mmHg - - 74 - 99 % - -  10/26/13 1830 126/75 mmHg - - 81 - 99 % - -  10/26/13 1818 133/72 mmHg - - 73 - 100 % - -    10/26/13 1815 132/77 mmHg - - 79 - 99 % - -  10/26/13 1708 146/60 mmHg - - 90 15 98 % - -  10/26/13 1539 116/97 mmHg - - 96 20 100 % - -  10/26/13 1446 127/70 mmHg - - 82 10 - - -  10/26/13 1419 120/80 mmHg - - 82 12 100 % - -  10/26/13 1245 128/72 mmHg - - 85 18 98 % - -    1. General:  in No Acute distress 2. Psychological: somnolent not Oriented 3. Head/ENT:     Dry Mucous Membranes                          Head Non traumatic, neck supple                          Normal  Dentition 4. SKIN: normal   Skin turgor,  Skin clean Dry numerous scratches 5. Heart: Regular rate and rhythm no Murmur, Rub or gallop 6. Lungs: Clear to auscultation bilaterally, no wheezes or crackles   7. Abdomen: Soft, non-tender, Non distended 8. Lower extremities: no clubbing, cyanosis, or edema 9. Neurologically Grossly intact, moving all 4 extremities equally 10. MSK: Normal range of motion  body mass index is 28.99 kg/(m^2).   Labs on Admission:   Recent Labs  10/26/13 1235  NA 137  K 4.2  CL 95*  CO2 21  GLUCOSE 139*  BUN 31*  CREATININE 1.10  CALCIUM 9.6    Recent Labs  10/26/13 1235  AST 109*  ALT 58*  ALKPHOS 56  BILITOT 1.5*  PROT 7.7  ALBUMIN 4.0   No results found for this basename: LIPASE, AMYLASE,  in the last 72 hours  Recent Labs  10/26/13 1235  WBC 12.5*  NEUTROABS 10.1*  HGB 13.8  HCT 39.4  MCV 88.7  PLT 340    Recent Labs  10/26/13 1235 10/26/13 1541 10/26/13 1935  CKTOTAL 2363* 1695* 1672*  TROPONINI <0.30  --   --    No results found for this basename: TSH, T4TOTAL, FREET3, T3FREE, THYROIDAB,  in the last 72 hours No results found for this basename: VITAMINB12, FOLATE, FERRITIN, TIBC, IRON, RETICCTPCT,  in the last 72 hours Lab Results  Component Value Date   HGBA1C 5.7* 03/18/2012    Estimated  Creatinine Clearance: 109 ml/min (by C-G formula based on Cr of 1.1). ABG    Component Value Date/Time   PHART 7.394 05/25/2012 1810   HCO3 21.9  05/25/2012 1810   TCO2 19.6 05/25/2012 1810   ACIDBASEDEF 2.0 05/25/2012 1810   O2SAT 98.3 05/25/2012 1810     No results found for this basename: DDIMER     Other results:  I have pearsonaly reviewed this: ECG REPORT  Rate: 90  Rhythm: NSR ST&T Change: nonischemic   BNP (last 3 results) No results found for this basename: PROBNP,  in the last 8760 hours  Filed Weights   10/27/13 0000  Weight: 105.2 kg (231 lb 14.8 oz)     Cultures:    Component Value Date/Time   SDES URINE, CATHETERIZED 05/25/2012 1334   SPECREQUEST NONE 05/25/2012 1334   CULT NO GROWTH 05/25/2012 1334   REPTSTATUS 05/26/2012 FINAL 05/25/2012 1334         Radiological Exams on Admission: Ct Head Wo Contrast  10/26/2013   CLINICAL DATA:  48 year old male with drug overdose and hallucinations.  EXAM: CT HEAD WITHOUT CONTRAST  TECHNIQUE: Contiguous axial images were obtained from the base of the skull through the vertex without intravenous contrast.  COMPARISON:  05/25/2012 head CT  FINDINGS: No intracranial abnormalities are identified, including mass lesion or mass effect, hydrocephalus, extra-axial fluid collection, midline shift, hemorrhage, or acute infarction.  The visualized bony calvarium is unremarkable.  IMPRESSION: Unremarkable noncontrast head CT.   Electronically Signed   By: Laveda Abbe M.D.   On: 10/26/2013 20:00    Chart has been reviewed  Assessment/Plan  48 yo M with polysubstance abuse here with oversedation, dehydration and rhabdomyelitis mild improving with IVF  Present on Admission:  . Dehydration - IVF . Rhabdomyolysis - IVF and follow CK . TRANSAMINASES, SERUM, ELEVATED - possibly due to dehydration, will repeat after IVF, obtain hepatitis serologies, possible alcohol abuse . Substance abuse - Social work consult . Overdose by amphetamine - psychiatry consult once patient is awake, suicide precaution until know intentions   Prophylaxis:   Lovenox, Protonix  CODE STATUS:  FULL  CODE   Other plan as per orders.  I have spent a total of 55 min on this admission  Kaija Kovacevic 10/27/2013, 12:58 AM  [facility]  Pager [phone]   If 7AM-7PM, please contact the day team taking care of the patient  [email or web address]  Password [email or web address]

## 2013-10-28 DIAGNOSIS — N179 Acute kidney failure, unspecified: Secondary | ICD-10-CM

## 2013-10-28 LAB — COMPREHENSIVE METABOLIC PANEL
ALBUMIN: 2.6 g/dL — AB (ref 3.5–5.2)
ALK PHOS: 40 U/L (ref 39–117)
ALT: 32 U/L (ref 0–53)
ANION GAP: 10 (ref 5–15)
AST: 34 U/L (ref 0–37)
BUN: 11 mg/dL (ref 6–23)
CO2: 23 mEq/L (ref 19–32)
Calcium: 7.7 mg/dL — ABNORMAL LOW (ref 8.4–10.5)
Chloride: 110 mEq/L (ref 96–112)
Creatinine, Ser: 0.81 mg/dL (ref 0.50–1.35)
GFR calc Af Amer: >90 mL/min (ref >90)
GFR calc non Af Amer: >90 mL/min (ref >90)
Glucose, Bld: 103 mg/dL — ABNORMAL HIGH (ref 70–99)
Potassium: 3.5 mEq/L — ABNORMAL LOW (ref 3.7–5.3)
SODIUM: 143 meq/L (ref 137–147)
TOTAL PROTEIN: 5.2 g/dL — AB (ref 6.0–8.3)
Total Bilirubin: 0.3 mg/dL (ref 0.3–1.2)

## 2013-10-28 LAB — CK: Total CK: 560 U/L — ABNORMAL HIGH (ref 7–232)

## 2013-10-28 NOTE — Progress Notes (Signed)
[place] TEAM 1 - Stepdown/ICU TEAM Progress Note  SIGMOND PATALANO ZOX:[id number] DOB: November 15, 1965 DOA: 10/26/2013 PCP: No primary provider on file.  Admit HPI / Brief Narrative: 48 y.o. male [facility] PMHx Drug abuse; Kidney failure; Bipolar 1 disorder; Rhabdomyolysis; Opiate abuse; Dental caries; Anxiety; Depression; Bipolar affective disorder; and Mood disorder w/ a long hx of substance abuse with multiple admissions. He reportedly was snorting adderall to get high.  Unknown amount was used. Later he took some ambien to calm himself down. Reportedly he was found wandering the streets actively hallucinating. He was stating that he wanted to "rob someone to get more Adderall to finish the job". Reportedly said he "would have killed anyone who he saw". Reported being homeless. Patient endorsed drinking unknown amount of alcohol.  He was first evaluated at [facility]. Patient was combative and was given Ativan IV and Geodone 20 mg IV.  He became somnolent and could not cooperate. Patient was transferred to [facility]. In the ER it was noted his CK was elevated at 2363 -> 1695 .    HPI/Subjective: 9/1 patient states she no longer wants to kill anybody however he is unsure if he wants to kill himself or not. However states he was trying to harm himself by taking as many pills as he could.  Assessment/Plan: Polysubstance abuse w/ amphetamine OD  -Psychiatry consult once patient is awake; patient awake but clearly incompetent  -As it is medically stable would proceed with involuntary commitment as patient is unsure whether he wants to harm himself.   Bipolar  -As soon as patient more medically stable and able to participate with a psychiatric eval, consult for medication recommendations   Suicide attempt -Per patient's own statement he attempted suicide  Rhabdo  -Continues to improve with hydration : Continue normal saline 170ml/hr  Dehydration -BUN/CR within normal range, however would continue to hydrate  aggressively secondary to rhabdomyolysis   Transaminitis  -Resolved  Acute renal failure -Resolved    Code Status: FULL Family Communication: no family present at time of exam Disposition Plan: Involuntary commitment once medically stable    Consultants: NA  Procedure/Significant Events: 8/30 CT head without contrast; negative   Culture NA  Antibiotics: NA  DVT prophylaxis: Lovenox   Devices    LINES / TUBES:      Continuous Infusions: . sodium chloride 100 mL/hr at 10/28/13 0814    Objective: VITAL SIGNS: Temp: 98 F (36.7 C) (09/01 0735) Temp src: Oral (09/01 0735) BP: 127/79 mmHg (09/01 0735) Pulse Rate: 55 (09/01 0735) SPO2; 99% on room air FIO2:   Intake/Output Summary (Last 24 hours) at 10/28/13 1050 Last data filed at 10/28/13 0814  Gross per 24 hour  Intake 4563.33 ml  Output    650 ml  Net 3913.33 ml     Exam: General: A./O. x2 (did not know where/when), NAD, No acute respiratory distress Lungs: Clear to auscultation bilaterally without wheezes or crackles Cardiovascular: Regular rate and rhythm without murmur gallop or rub normal S1 and S2 Abdomen: Nontender, nondistended, soft, bowel sounds positive, no rebound, no ascites, no appreciable mass Extremities: No significant cyanosis, clubbing, or edema bilateral lower extremities Psychiatric; states she no longer wants to harm anyone else, however unsure if he wants to harm himself  Data Reviewed: Basic Metabolic Panel:  Recent Labs Lab 10/26/13 1235 10/27/13 0315 10/28/13 0221  NA 137 141 143  K 4.2 3.8 3.5*  CL 95* 108 110  CO2 GLUCOSE 139* 75 103*  BUN 31* 17 11  CREATININE 1.10 0.80 0.81  CALCIUM 9.6 7.7* 7.7*  MG  --  2.0  --   PHOS  --  2.0*  --    Liver Function Tests:  Recent Labs Lab 10/26/13 1235 10/27/13 0315 10/28/13 0221  AST 109* 57* 34  ALT 58* 40 32  ALKPHOS 56 41 40  BILITOT 1.5* 0.7 0.3  PROT 7.7 5.6* 5.2*  ALBUMIN 4.0 2.9* 2.6*    No results found for this basename: LIPASE, AMYLASE,  in the last 168 hours No results found for this basename: AMMONIA,  in the last 168 hours CBC:  Recent Labs Lab 10/26/13 1235 10/27/13 0315  WBC 12.5* 5.9  NEUTROABS 10.1*  --   HGB 13.8 11.9*  HCT 39.4 33.7*  MCV 88.7 88.5  PLT 340 257   Cardiac Enzymes:  Recent Labs Lab 10/26/13 1235 10/26/13 1541 10/26/13 1935 10/27/13 0315 10/28/13 0221  CKTOTAL 2363* 1695* 1672* 1043* 560*  TROPONINI <0.30  --   --   --   --    BNP (last 3 results) No results found for this basename: PROBNP,  in the last 8760 hours CBG: No results found for this basename: GLUCAP,  in the last 168 hours  Recent Results (from the past 240 hour(s))  MRSA PCR SCREENING     Status: Abnormal   Collection Time    10/27/13 12:28 AM      Result Value Ref Range Status   MRSA by PCR POSITIVE (*) NEGATIVE Final   Comment:            The GeneXpert MRSA Assay (FDA     approved for NASAL specimens     only), is one component of a     comprehensive MRSA colonization     surveillance program. It is not     intended to diagnose MRSA     infection nor to guide or     monitor treatment for     MRSA infections.     RESULT CALLED TO, READ BACK BY AND VERIFIED WITH:     [facility] RN [id number] AT 0449 SKEEN,P     Studies:  Recent x-ray studies have been reviewed in detail by the Attending Physician  Scheduled Meds:  Scheduled Meds: . benztropine  2 mg Oral BID  . Chlorhexidine Gluconate Cloth  6 each Topical Q0600  . docusate sodium  100 mg Oral BID  . enoxaparin (LOVENOX) injection  40 mg Subcutaneous Q24H  . folic acid  1 mg Oral Daily  . mupirocin ointment  1 application Nasal BID  . thiamine  100 mg Oral Daily  . ziprasidone  120 mg Oral QHS    Time spent on care of this patient: 40 mins   Drema Dallas , MD   [facility] Office  [phone] Pager [phone]  On-Call/Text Page:      [email or web address]      password  [email or web address]  If 7PM-7AM, please contact night-coverage [email or web address] Password [email or web address] 10/28/2013, 10:50 AM   LOS: 2 days

## 2013-10-29 MED ORDER — SODIUM CHLORIDE 0.9 % IJ SOLN
3.0000 mL | INTRAMUSCULAR | Status: DC | PRN
  Administered 2013-10-29: 3 mL via INTRAVENOUS

## 2013-10-29 MED ORDER — TRAMADOL HCL 50 MG PO TABS
50.0000 mg | ORAL_TABLET | Freq: Four times a day (QID) | ORAL | Status: DC | PRN
  Administered 2013-10-29 – 2013-11-04 (×17): 50 mg via ORAL
  Filled 2013-10-29 (×19): qty 1

## 2013-10-29 MED ORDER — SODIUM CHLORIDE 0.9 % IJ SOLN
3.0000 mL | Freq: Two times a day (BID) | INTRAMUSCULAR | Status: DC
  Administered 2013-10-29 – 2013-11-04 (×6): 3 mL via INTRAVENOUS

## 2013-10-29 MED ORDER — KETOROLAC TROMETHAMINE 15 MG/ML IJ SOLN: 15.0000 mg | Freq: Three times a day (TID) | INTRAMUSCULAR | Status: DC | PRN

## 2013-10-29 MED ORDER — LURASIDONE HCL 40 MG PO TABS: 40.0000 mg | ORAL_TABLET | Freq: Every day | ORAL | Status: DC

## 2013-10-29 NOTE — Progress Notes (Addendum)
[place] TEAM 1 - Stepdown/ICU TEAM Progress Note  Brandon [name]:[id number] DOB: [birth year] DOA: 10/26/2013 PCP: No primary provider on file.  Admit HPI / Brief Narrative: 48 y.o. male w/ a past medical history of Drug abuse; Kidney failure; Bipolar 1 disorder; Rhabdomyolysis; Opiate abuse; Dental caries; Anxiety; Depression; Bipolar affective disorder; and Mood disorder w/ a long hx of substance abuse with multiple admissions. He reportedly was snorting adderall to get high.  Unknown amount was used.  Later he took some ambien to calm himself down. Reportedly he was found wandering the streets actively hallucinating. He was stating that he wanted to "rob someone to get more Adderall to finish the job".  Reportedly said he "would have killed anyone who he saw". Reported being homeless. Patient endorsed drinking unknown amount of alcohol.   He was first evaluated at [facility]. Patient was combative and was given Ativan IV and Geodone 20 mg IV.  He became somnolent and could not cooperate. Patient was transferred to [facility].  In the ER it was noted his CK was elevated at 2363 -> 1695.  He has stabilized in regard to his amphetamine OD, and the resultant rhabdo.  Questions remain regarding his mental capacity, competence, and the potential need for inpatient psychiatric care.  Accordingly, Psychiatry has been asked to evaluate him, and he is to remain on suicide precautions in there interim.  HPI/Subjective: Pt c/o R knee pain today, and asks specifically for oxycodone.  Denies recollection of event leading to his hospitalization.  Denies cp, sob, n/v, or abdom pain.    Assessment/Plan:  Polysubstance abuse w/ amphetamine OD - possible suicide attempt Psychiatry consult called today - pt reported to MD yesterday that he may have been trying to harm himself - there are concerns regarding his competence as well - medically he is cleared in regard to the physiologic effects of his amphetamine OD    Bipolar d/o Psychiatry to suggest if tx regimen indicated and if inpt tx indicated   Rhabdo Essentially resolved w/ hydration - change IV to NSL and recheck CK in AM   Dehydration Resolved w/ IVF resuscitation   Transaminitis  -Resolved  Acute renal failure -Resolved   Code Status: FULL Family Communication: no family present at time of exam  Disposition Plan: stable for transfer to med bed under suicide precautions   Consultants: Psychiatry - called 9/2  Procedures: none  Antibiotics: none  DVT prophylaxis: lovenox   Objective: Blood pressure 117/63, pulse 72, temperature 97.8 F (36.6 C), temperature source Oral, resp. rate 21, height  (1.905 m), weight 105.2 kg (231 lb 14.8 oz), SpO2 100.00%.  Intake/Output Summary (Last 24 hours) at 10/29/13 1515 Last data filed at 10/29/13 1108  Gross per 24 hour  Intake 3136.67 ml  Output   4450 ml  Net -1313.33 ml   Exam: General: No acute respiratory distress Lungs: Clear to auscultation bilaterally without wheezes or crackles Cardiovascular: Regular rate and rhythm without murmur gallop or rub normal S1 and S2 Abdomen: Nontender, nondistended, soft, bowel sounds positive, no rebound, no ascites, no appreciable mass Extremities: No significant cyanosis, clubbing, or edema bilateral lower extremities  Data Reviewed: Basic Metabolic Panel:  Recent Labs Lab 10/26/13 1235 10/27/13 0315 10/28/13 0221  NA 137 141 143  K 4.2 3.8 3.5*  CL 95* 108 110  CO2 GLUCOSE 139* 75 103*  BUN 31* 17 11  CREATININE 1.10 0.80 0.81  CALCIUM 9.6 7.7* 7.7*  MG  --  2.0  --   PHOS  --  2.0*  --    Liver Function Tests:  Recent Labs Lab 10/26/13 1235 10/27/13 0315 10/28/13 0221  AST 109* 57* 34  ALT 58* 40 32  ALKPHOS 56 41 40  BILITOT 1.5* 0.7 0.3  PROT 7.7 5.6* 5.2*  ALBUMIN 4.0 2.9* 2.6*   Coags:  Recent Labs Lab 10/27/13 0315  INR 1.18   CBC:  Recent Labs Lab 10/26/13 1235 10/27/13 0315   WBC 12.5* 5.9  NEUTROABS 10.1*  --   HGB 13.8 11.9*  HCT 39.4 33.7*  MCV 88.7 88.5  PLT 340 257   Cardiac Enzymes:  Recent Labs Lab 10/26/13 1235 10/26/13 1541 10/26/13 1935 10/27/13 0315 10/28/13 0221  CKTOTAL 2363* 1695* 1672* 1043* 560*  TROPONINI <0.30  --   --   --   --     Recent Results (from the past 240 hour(s))  MRSA PCR SCREENING     Status: Abnormal   Collection Time    10/27/13 12:28 AM      Result Value Ref Range Status   MRSA by PCR POSITIVE (*) NEGATIVE Final   Comment:            The GeneXpert MRSA Assay (FDA     approved for NASAL specimens     only), is one component of a     comprehensive MRSA colonization     surveillance program. It is not     intended to diagnose MRSA     infection nor to guide or     monitor treatment for     MRSA infections.     RESULT CALLED TO, READ BACK BY AND VERIFIED WITH:     [facility] RN [id number] AT 0449 SKEEN,P    Studies:  Recent x-ray studies have been reviewed in detail by the Attending Physician  Scheduled Meds:  Scheduled Meds: . benztropine  2 mg Oral BID  . Chlorhexidine Gluconate Cloth  6 each Topical Q0600  . docusate sodium  100 mg Oral BID  . enoxaparin (LOVENOX) injection  40 mg Subcutaneous Q24H  . folic acid  1 mg Oral Daily  . mupirocin ointment  1 application Nasal BID  . thiamine  100 mg Oral Daily  . ziprasidone  120 mg Oral QHS    Time spent on care of this patient: 35 mins   Mashell Sieben T , MD   [facility] Office  [phone] Pager - Text Page per Loretha Stapler as per below:  On-Call/Text Page:      [email or web address]      password [email or web address]  If 7PM-7AM, please contact night-coverage [email or web address] Password [email or web address] 10/29/2013, 3:15 PM   LOS: 3 days

## 2013-10-30 DIAGNOSIS — R45851 Suicidal ideations: Secondary | ICD-10-CM

## 2013-10-30 DIAGNOSIS — F1920 Other psychoactive substance dependence, uncomplicated: Secondary | ICD-10-CM

## 2013-10-30 DIAGNOSIS — F3130 Bipolar disorder, current episode depressed, mild or moderate severity, unspecified: Secondary | ICD-10-CM

## 2013-10-30 DIAGNOSIS — X838XXA Intentional self-harm by other specified means, initial encounter: Secondary | ICD-10-CM

## 2013-10-30 DIAGNOSIS — F39 Unspecified mood [affective] disorder: Secondary | ICD-10-CM

## 2013-10-30 DIAGNOSIS — F1994 Other psychoactive substance use, unspecified with psychoactive substance-induced mood disorder: Secondary | ICD-10-CM

## 2013-10-30 LAB — BASIC METABOLIC PANEL
Anion gap: 13 (ref 5–15)
BUN: 14 mg/dL (ref 6–23)
CO2: 25 mEq/L (ref 19–32)
Calcium: 8.4 mg/dL (ref 8.4–10.5)
Chloride: 104 mEq/L (ref 96–112)
Creatinine, Ser: 1.05 mg/dL (ref 0.50–1.35)
GFR calc Af Amer: >90 mL/min (ref >90)
GFR calc non Af Amer: 83 mL/min — ABNORMAL LOW (ref >90)
Glucose, Bld: 84 mg/dL (ref 70–99)
Potassium: 4.2 mEq/L (ref 3.7–5.3)
SODIUM: 142 meq/L (ref 137–147)

## 2013-10-30 LAB — CK: Total CK: 290 U/L — ABNORMAL HIGH (ref 7–232)

## 2013-10-30 NOTE — Progress Notes (Addendum)
Pt seen and examined. Agree with assessment and plan as outlined above. Briefly, pt presents with polysubstance abuse OD in an apparent suicide attempt. Pt also with ARF and transaminitis since resolved. Cont to follow. Psychiatry consulted. Will follow recs.

## 2013-10-30 NOTE — Consult Note (Signed)
[facility] Consult   Reason for Consult:  Depression with suicidal ideation and polysubstance abuse Referring Physician:  Cherene Altes, MD  Brandon [name] is an 48 y.o. male. Total Time spent with patient: 45 minutes  Assessment: AXIS I:  Bipolar, Depressed, Substance Induced Mood Disorder and Polysubstance abuse AXIS II:  Deferred AXIS III:   Past Medical History  Diagnosis Date  . Drug abuse   . Kidney failure   . Bipolar 1 disorder   . Rhabdomyolysis   . Acute renal failure   . Opiate abuse, continuous   . Dental caries     extensive  . Anxiety   . Depression   . Bipolar affective disorder 03/17/2012  . Mood disorder 03/16/2012   AXIS IV:  other psychosocial or environmental problems, problems related to social environment and problems with primary support group AXIS V:  41-50 serious symptoms  Plan:  Case will be discussed with Cherene Altes, MD Continue Geodon 120 mg daily at bedtime for bipolar disorder and benztropine 2 mg and monitor for QTC prolongation Recommend psychiatric Inpatient admission when medically cleared. Supportive therapy provided about ongoing stressors. Appreciate psychiatric consultation Please contact [phone] if needs further assistance  Subjective:   Brandon [name] is a 48 y.o. male patient admitted with Depression with suicidal ideation and polysubstance abuse.  HPI:  Patient is seen face-to-face for psychiatric evaluation. Patient appeared lying in his bed, disheveled and poorly cooperative with this evaluation. Patient closed his eyes when talking and stated his parent's let him go out of the house because of ongoing drug of abuse. Patient stated he has been abusing drugs since age 67, mostly opiates and amphetamines. Reportedly he snorted about 60 tablets of Adderall before admission. Patient urine drug screen is positive for amphetamines but negative for opiates and benzodiazepines. Patient does not appear to have any  withdrawal symptoms of those two substances. Patient is also used heroin in the past. Patient stated he become homeless and does not want to live any longer and stated he overdosed date intention to end his life. Patient continued to have symptoms of depression, disturbance of sleep and appetite, poor energy, low self-esteem. Patient stated his stresses are no drugs, no money, no place to live in all help from other people. He has no contact with  Other family members. Reportedly patient was received detox treatment and had rehabilitation treatment in [place], [place] in the recent past and become sober about a week before her last stone history drug abuse. Patient does not follow up with outpatient psychiatric services for bipolar disorder at the [facility]. 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 He has one previous acute psychiatric hospitalization in behavioral health during 2014 for substance abuse.  Medical history: 48 y.o. male w/ a past medical history of Drug abuse; Kidney failure; Bipolar 1 disorder; Rhabdomyolysis; Opiate abuse; Dental caries; Anxiety; Depression; Bipolar affective disorder; and Mood disorder w/ a long hx of substance abuse with multiple admissions. He reportedly was snorting adderall to get high. Later he took some ambien to calm himself down. Reportedly he was found wandering the streets actively hallucinating. He was stating that he wanted to "rob someone to get more Adderall to finish the job". Reportedly said he "would have killed anyone who he saw". Reported being homeless. Patient endorsed drinking unknown amount of alcohol.  He was first evaluated at [facility]. Patient was combative and was  given Ativan IV and Geodone 20 mg IV. He became somnolent and could not cooperate. Patient was transferred to [facility]. In the ER it was noted his CK was elevated at 2363 -> 1695. He has stabilized in regard to his amphetamine OD, and the  resultant rhabdo. Questions remain regarding his mental capacity, competence, and the potential need for inpatient psychiatric care. Accordingly, Psychiatry has been asked to evaluate him, and he is to remain on suicide precautions in there interim.  HPI Elements:    Location:  depression and substance abuse. Quality:  poor. Severity:  acute. Timing:  multiple psychosocial stresses.  Past Psychiatric History: Past Medical History  Diagnosis Date  . Drug abuse   . Kidney failure   . Bipolar 1 disorder   . Rhabdomyolysis   . Acute renal failure   . Opiate abuse, continuous   . Dental caries     extensive  . Anxiety   . Depression   . Bipolar affective disorder 03/17/2012  . Mood disorder 03/16/2012    reports that he has never smoked. He has never used smokeless tobacco. He reports that he uses illicit drugs (Oxycodone, Amphetamines, Barbituates, Cocaine, and Methamphetamines). He reports that he does not drink alcohol. Family History  Problem Relation Age of Onset  . Heart disease Mother   . Heart disease Father      Living Arrangements: Parent   Abuse/Neglect [facility]) Physical Abuse: Denies Verbal Abuse: Denies Sexual Abuse: Denies Allergies:   Allergies  Allergen Reactions  . Other Anaphylaxis and Other (See Comments)    [place], kidney and pork & beans     ACT Assessment Complete:  NO Objective: Blood pressure 102/55, pulse 60, temperature 98.3 F (36.8 C), temperature source Oral, resp. rate 17, height 6' 3"  (1.905 m), weight 105.2 kg (231 lb 14.8 oz), SpO2 98.00%.Body mass index is 28.99 kg/(m^2). Results for orders placed during the hospital encounter of 10/26/13 (from the past 72 hour(s))  COMPREHENSIVE METABOLIC PANEL     Status: Abnormal   Collection Time    10/28/13  2:21 AM      Result Value Ref Range   Sodium 143  137 - 147 mEq/L   Potassium 3.5 (*) 3.7 - 5.3 mEq/L   Chloride 110  96 - 112 mEq/L   CO2 23  19 - 32 mEq/L   Glucose, Bld 103 (*) 70 - 99 mg/dL    BUN 11  6 - 23 mg/dL   Creatinine, Ser 0.81  0.50 - 1.35 mg/dL   Calcium 7.7 (*) 8.4 - 10.5 mg/dL   Total Protein 5.2 (*) 6.0 - 8.3 g/dL   Albumin 2.6 (*) 3.5 - 5.2 g/dL   AST 34  0 - 37 U/L   ALT 32  0 - 53 U/L   Alkaline Phosphatase 40  39 - 117 U/L   Total Bilirubin 0.3  0.3 - 1.2 mg/dL   GFR calc non Af Amer >90  >90 mL/min   GFR calc Af Amer >90  >90 mL/min   Comment: (NOTE)     The eGFR has been calculated using the CKD EPI equation.     This calculation has not been validated in all clinical situations.     eGFR's persistently <90 mL/min signify possible Chronic Kidney     Disease.   Anion gap 10  5 - 15  CK     Status: Abnormal   Collection Time    10/28/13  2:21 AM  Result Value Ref Range   Total CK 560 (*) 7 - 232 U/L  BASIC METABOLIC PANEL     Status: Abnormal   Collection Time    10/30/13  6:45 AM      Result Value Ref Range   Sodium 142  137 - 147 mEq/L   Potassium 4.2  3.7 - 5.3 mEq/L   Chloride 104  96 - 112 mEq/L   CO2 25  19 - 32 mEq/L   Glucose, Bld 84  70 - 99 mg/dL   BUN 14  6 - 23 mg/dL   Creatinine, Ser 1.05  0.50 - 1.35 mg/dL   Calcium 8.4  8.4 - 10.5 mg/dL   GFR calc non Af Amer 83 (*) >90 mL/min   GFR calc Af Amer >90  >90 mL/min   Comment: (NOTE)     The eGFR has been calculated using the CKD EPI equation.     This calculation has not been validated in all clinical situations.     eGFR's persistently <90 mL/min signify possible Chronic Kidney     Disease.   Anion gap 13  5 - 15  CK     Status: Abnormal   Collection Time    10/30/13  6:45 AM      Result Value Ref Range   Total CK 290 (*) 7 - 232 U/L   Labs are reviewed.  Current Facility-Administered Medications  Medication Dose Route Frequency Provider Last Rate Last Dose  . acetaminophen (TYLENOL) tablet 650 mg  650 mg Oral Q6H PRN Toy Baker, MD       Or  . acetaminophen (TYLENOL) suppository 650 mg  650 mg Rectal Q6H PRN Toy Baker, MD      . benztropine  (COGENTIN) tablet 2 mg  2 mg Oral BID Toy Baker, MD   2 mg at 10/30/13 0951  . Chlorhexidine Gluconate Cloth 2 % PADS 6 each  6 each Topical Q0600 Toy Baker, MD   6 each at 10/30/13 0556  . docusate sodium (COLACE) capsule 100 mg  100 mg Oral BID Toy Baker, MD   100 mg at 10/30/13 0951  . enoxaparin (LOVENOX) injection 40 mg  40 mg Subcutaneous Q24H Toy Baker, MD   40 mg at 10/29/13 1400  . folic acid (FOLVITE) tablet 1 mg  1 mg Oral Daily Cherene Altes, MD   1 mg at 10/30/13 0951  . ketorolac (TORADOL) 15 MG/ML injection 15 mg  15 mg Intravenous Q8H PRN Cherene Altes, MD      . LORazepam (ATIVAN) tablet 1 mg  1 mg Oral Q6H PRN Toy Baker, MD   1 mg at 10/30/13 0956  . mupirocin ointment (BACTROBAN) 2 % 1 application  1 application Nasal BID Toy Baker, MD   1 application at 29/92/42 0951  . ondansetron (ZOFRAN) tablet 4 mg  4 mg Oral Q6H PRN Toy Baker, MD       Or  . ondansetron (ZOFRAN) injection 4 mg  4 mg Intravenous Q6H PRN Toy Baker, MD      . sodium chloride 0.9 % injection 3 mL  3 mL Intravenous PRN Cherene Altes, MD   3 mL at 10/29/13 1756  . sodium chloride 0.9 % injection 3 mL  3 mL Intravenous Q12H Cherene Altes, MD   3 mL at 10/30/13 0959  . thiamine (VITAMIN B-1) tablet 100 mg  100 mg Oral Daily Cherene Altes, MD   100 mg at 10/30/13  4081  . traMADol (ULTRAM) tablet 50 mg  50 mg Oral Q6H PRN Cherene Altes, MD   50 mg at 10/30/13 0955  . ziprasidone (GEODON) capsule 120 mg  120 mg Oral QHS Toy Baker, MD   120 mg at 10/29/13 2123    Psychiatric Specialty Exam: Physical Exam as per history and physical   Review of Systems  Psychiatric/Behavioral: Positive for depression, suicidal ideas and substance abuse. The patient is nervous/anxious and has insomnia.     Blood pressure 102/55, pulse 60, temperature 98.3 F (36.8 C), temperature source Oral, resp. rate 17, height 6' 3"  (1.905  m), weight 105.2 kg (231 lb 14.8 oz), SpO2 98.00%.Body mass index is 28.99 kg/(m^2).  General Appearance: Guarded  Eye Contact::  Fair  Speech:  Clear and Coherent, Slow and Slurred  Volume:  Decreased  Mood:  Anxious, Depressed, Hopeless and Worthless  Affect:  Congruent and Depressed  Thought Process:  Coherent and Goal Directed  Orientation:  Full (Time, Place, and Person)  Thought Content:  WDL  Suicidal Thoughts:  Yes.  with intent/plan  Homicidal Thoughts:  No  Memory:  Immediate;   Fair Recent;   Fair  Judgement:  Impaired  Insight:  Lacking  Psychomotor Activity:  Psychomotor Retardation and Restlessness  Concentration:  Fair  Recall:  Blakely: Fair  Akathisia:  NA  Handed:  Right  AIMS (if indicated):     Assets:  Communication Skills Leisure Time Resilience Social Support  Sleep:      Musculoskeletal: Strength & Muscle Tone: decreased Gait & Station: unable to stand Patient leans: N/A  Treatment Plan Summary: Daily contact with patient to assess and evaluate symptoms and progress in treatment Medication management Recommended acute psychiatric hospitalization when medically stable continue his current medication Geodon and benztropine.  Shakyia Bosso,JANARDHAHA R. 10/30/2013 11:18 AM

## 2013-10-30 NOTE — Progress Notes (Signed)
Progress Note  ALIAS VILLAGRAN ZOX:[id number] DOB: February 09, 1966 DOA: 10/26/2013 PCP: No primary provider on file.  Patient is medically cleared for discharge pending psychiatric evaluation.  Admit HPI / Brief Narrative: 48 y.o. male w/ a past medical history of Drug abuse; Kidney failure; Bipolar 1 disorder; Rhabdomyolysis; Opiate abuse; Dental caries; Anxiety; Depression; Bipolar affective disorder; and Mood disorder w/ a long hx of substance abuse with multiple admissions. He reportedly was snorting adderall to get high. Unknown amount was used.  Later he took some ambien to calm himself down. Reportedly he was found wandering the streets actively hallucinating. He was stating that he wanted to "rob someone to get more Adderall to finish the job".  Reportedly said he "would have killed anyone who he saw". Reported being homeless. Patient endorsed drinking unknown amount of alcohol.   He was first evaluated at [facility]. Patient was combative and was given Ativan IV and Geodone 20 mg IV. He became somnolent and could not cooperate. Patient was transferred to [facility].  In the ER it was noted his CK was elevated at 2363 -> 1695.  He has stabilized in regard to his amphetamine OD, and the resultant rhabdo.  Questions remain regarding his mental capacity, competence, and the potential need for inpatient psychiatric care.  Accordingly, Psychiatry has been asked to evaluate him, and he is to remain on suicide precautions in the interim.  HPI/Subjective: Pt c/o R knee and right hip pain.  States he had surgery on it a while ago and recently twisted it.  Assessment/Plan:  Polysubstance abuse w/ amphetamine OD - possible suicide attempt Psychiatry consult called 9/2 - pt reported to MD 9/1 that he may have been trying to harm himself - there are concerns regarding his competence as well - medically he is cleared in regard to the physiologic effects of his amphetamine OD.  Bipolar d/o Psychiatry to determine  psychotropic medications and determine if inpatient tx is indicated   Rhabdo resolved w/ hydration   Dehydration Resolved w/ IVF resuscitation   Transaminitis  Resolved  Acute renal failure Resolved   Code Status: FULL Family Communication: no family present at time of exam  Disposition Plan: stable for transfer behavioral health if recommended by psychiatry.  Consultants: Psychiatry - called 9/2  Procedures: none  Antibiotics: none  DVT prophylaxis: lovenox   Objective: Blood pressure 102/55, pulse 60, temperature 98.3 F (36.8 C), temperature source Oral, resp. rate 17, height  (1.905 m), weight 105.2 kg (231 lb 14.8 oz), SpO2 98.00%.  Intake/Output Summary (Last 24 hours) at 10/30/13 1050 Last data filed at 10/30/13 0959  Gross per 24 hour  Intake   1503 ml  Output   2000 ml  Net   -497 ml   Exam: General: Awake, Alert, NAD. Lungs: Clear to auscultation bilaterally without wheezes or crackles Cardiovascular: Regular rate and rhythm without murmur gallop or rub normal S1 and S2 Abdomen: mildly tender to palpation, nondistended, soft, bowel sounds positive, no rebound, no ascites, no appreciable mass Extremities: No significant cyanosis, clubbing, or edema bilateral lower extremities Skin:  Small scabbed area on the distal anterior tibia area.  Data Reviewed: Basic Metabolic Panel:  Recent Labs Lab 10/26/13 1235 10/27/13 0315 10/28/13 0221 10/30/13 0645  NA 137 141 143 142  K 4.2 3.8 3.5* 4.2  CL 95* 108 110 104  CO2 GLUCOSE 139* 75 103* 84  BUN 31* CREATININE 1.10 0.80 0.81 1.05  CALCIUM 9.6 7.7* 7.7* 8.4  MG  --  2.0  --   --   PHOS  --  2.0*  --   --    Liver Function Tests:  Recent Labs Lab 10/26/13 1235 10/27/13 0315 10/28/13 0221  AST 109* 57* 34  ALT 58* 40 32  ALKPHOS 56 41 40  BILITOT 1.5* 0.7 0.3  PROT 7.7 5.6* 5.2*  ALBUMIN 4.0 2.9* 2.6*   Coags:  Recent Labs Lab 10/27/13 0315  INR 1.18    CBC:  Recent Labs Lab 10/26/13 1235 10/27/13 0315  WBC 12.5* 5.9  NEUTROABS 10.1*  --   HGB 13.8 11.9*  HCT 39.4 33.7*  MCV 88.7 88.5  PLT 340 257   Cardiac Enzymes:  Recent Labs Lab 10/26/13 1235 10/26/13 1541 10/26/13 1935 10/27/13 0315 10/28/13 0221 10/30/13 0645  CKTOTAL 2363* 1695* 1672* 1043* 560* 290*  TROPONINI <0.30  --   --   --   --   --     Recent Results (from the past 240 hour(s))  MRSA PCR SCREENING     Status: Abnormal   Collection Time    10/27/13 12:28 AM      Result Value Ref Range Status   MRSA by PCR POSITIVE (*) NEGATIVE Final   Comment:            The GeneXpert MRSA Assay (FDA     approved for NASAL specimens     only), is one component of a     comprehensive MRSA colonization     surveillance program. It is not     intended to diagnose MRSA     infection nor to guide or     monitor treatment for     MRSA infections.     RESULT CALLED TO, READ BACK BY AND VERIFIED WITH:     [facility] RN [id number] AT 0449 SKEEN,P    Studies:  Recent x-ray studies have been reviewed in detail by the Attending Physician  Scheduled Meds:  Scheduled Meds: . benztropine  2 mg Oral BID  . Chlorhexidine Gluconate Cloth  6 each Topical Q0600  . docusate sodium  100 mg Oral BID  . enoxaparin (LOVENOX) injection  40 mg Subcutaneous Q24H  . folic acid  1 mg Oral Daily  . mupirocin ointment  1 application Nasal BID  . sodium chloride  3 mL Intravenous Q12H  . thiamine  100 mg Oral Daily  . ziprasidone  120 mg Oral QHS    Time spent on care of this patient: 35 mins   York, Tora Kindred , PA-C [facility] If 7PM-7AM, please contact night-coverage [email or web address] Password [email or web address] 10/30/2013, 10:50 AM   LOS: 4 days

## 2013-10-30 NOTE — Progress Notes (Signed)
The patients father dropped off a large bag of clothing for the patient. Due to him being on suicide precautions the items were labeled with a patient label and room number. Items placed in outside of the room in the closet.

## 2013-10-31 DIAGNOSIS — F1910 Other psychoactive substance abuse, uncomplicated: Secondary | ICD-10-CM

## 2013-10-31 DIAGNOSIS — T438X2A Poisoning by other psychotropic drugs, intentional self-harm, initial encounter: Secondary | ICD-10-CM

## 2013-10-31 DIAGNOSIS — F1920 Other psychoactive substance dependence, uncomplicated: Secondary | ICD-10-CM

## 2013-10-31 DIAGNOSIS — T43502A Poisoning by unspecified antipsychotics and neuroleptics, intentional self-harm, initial encounter: Secondary | ICD-10-CM

## 2013-10-31 MED ORDER — KETOROLAC TROMETHAMINE 10 MG PO TABS
10.0000 mg | ORAL_TABLET | Freq: Four times a day (QID) | ORAL | Status: DC | PRN
  Administered 2013-11-01 – 2013-11-04 (×9): 10 mg via ORAL
  Filled 2013-10-31 (×10): qty 1

## 2013-10-31 MED ORDER — LURASIDONE HCL 40 MG PO TABS
40.0000 mg | ORAL_TABLET | Freq: Every day | ORAL | Status: DC
  Filled 2013-10-31 (×2): qty 1

## 2013-10-31 NOTE — Progress Notes (Signed)
Pt seen and examined. Agree with above assessment and plan. Pt presents with polysubstance abuse in an apparent suicide attempt. Psychiatry following. Pt is medically clear for transfer to inpatient psychiatry when available.

## 2013-10-31 NOTE — Care Management Note (Signed)
    Page 1 of 1   11/04/2013     3:34:56 PM CARE MANAGEMENT NOTE 11/04/2013  Patient:  Brandon [name], Brandon [name]   Account Number:  [id number]  Date Initiated:  10/27/2013  Documentation initiated by:  Donn Pierini  Subjective/Objective Assessment:   Pt admitted with overdose/rhabdo     Action/Plan:   PTA pt lived at home, will need to be seen by psych - has a sitter   Anticipated DC Date:  11/04/2013   Anticipated DC Plan:  PSYCHIATRIC HOSPITAL      DC Planning Services  CM consult      Choice offered to / List presented to:             Status of service:  Completed, signed off Medicare Important Message given?  NO (If response is "NO", the following Medicare IM given date fields will be blank) Date Medicare IM given:   Medicare IM given by:   Date Additional Medicare IM given:   Additional Medicare IM given by:    Discharge Disposition:  PSYCHIATRIC HOSPITAL  Per UR Regulation:  Reviewed for med. necessity/level of care/duration of stay  If discussed at Long Length of Stay Meetings, dates discussed:    Comments:  11/04/13 1531 Letha Cape RN, BSN [phone] patient is for dc to psych facility today, CSW following.  10/31/13 1509 Letha Cape RN, BSN [phone] Patient is a transfer from unit, patient is for inpt psych, there are no beds available at this time per psych CSW, but still working on.

## 2013-10-31 NOTE — Clinical Social Work Note (Addendum)
Psych CSW sent clinical referral to the following inpatient psychiatric facilities:  [facility] ph [phone] f [phone]- no beds available today - pt on waitlist  [facility] ph [phone] f [phone] no beds available at capacity [facility] ph [phone] f [phone] no beds available pt waiting in [facility] ED [place] ph [phone] [phone] l/m [facility] ph [phone] f [phone] - no beds  Vickii Penna, LCSWA [phone]  Psychiatric & Orthopedics (5N 1-16) Clinical Social Worker

## 2013-10-31 NOTE — Progress Notes (Signed)
Progress Note  Brandon [name] ZOX:[id number] DOB: 1965/08/27 DOA: 10/26/2013 PCP: No primary provider on file.  Patient is medically cleared for discharge when [facility] has a bed available.  Admit HPI / Brief Narrative: 48 y.o. male w/ a past medical history of Drug abuse; Kidney failure; Bipolar 1 disorder; Rhabdomyolysis; Opiate abuse; Dental caries; Anxiety; Depression; Bipolar affective disorder; and Mood disorder w/ a long hx of substance abuse with multiple admissions. He reportedly was snorting adderall to get high. Unknown amount was used.  Later he took some ambien to calm himself down. Reportedly he was found wandering the streets actively hallucinating. He was stating that he wanted to "rob someone to get more Adderall to finish the job".  Reportedly said he "would have killed anyone who he saw". Reported being homeless. Patient endorsed drinking unknown amount of alcohol.   He was first evaluated at [facility]. Patient was combative and was given Ativan IV and Geodone 20 mg IV. He became somnolent and could not cooperate. Patient was transferred to [facility].  In the ER it was noted his CK was elevated at 2363 -> 1695.  He has stabilized in regard to his amphetamine OD, and the resultant rhabdo.  Questions remain regarding his mental capacity, competence, and the potential need for inpatient psychiatric care.  Accordingly, Psychiatry has evaluated him, and he is to remain on suicide precautions while waiting for a bed at [facility].  HPI/Subjective: Still complains of R knee and right hip pain.  No other complaints.  Assessment/Plan:  Polysubstance abuse w/ amphetamine OD - possible suicide attempt Psychiatry consult appreciated. Pt reported to MD 9/1 that he may have been trying to harm himself - there are concerns regarding his competence as well  Medically he is cleared in regard to the physiologic effects of his amphetamine OD.  Bipolar d/o Psychiatry recommends continuation of Geodon and Benztropine,  as well as acute psychiatric hospitalization when a bed is available at [facility].  Rhabdo resolved w/ hydration   Dehydration Resolved w/ IVF resuscitation   Transaminitis  Resolved  Acute renal failure Resolved   Code Status: FULL Family Communication: no family present at time of exam  Disposition Plan: stable for transfer behavioral health when bed available.  Consultants: Psychiatry  Procedures: none  Antibiotics: none  DVT prophylaxis: lovenox   Objective: Blood pressure 121/78, pulse 52, temperature 97.5 F (36.4 C), temperature source Oral, resp. rate 18, height  (1.905 m), weight 105.2 kg (231 lb 14.8 oz), SpO2 95.00%.  Intake/Output Summary (Last 24 hours) at 10/31/13 1217 Last data filed at 10/31/13 0400  Gross per 24 hour  Intake    580 ml  Output   1500 ml  Net   -920 ml   Exam: General: Awake, Alert, NAD. Unchanged. Pleasant. Lungs: Clear to auscultation bilaterally without wheezes or crackles Cardiovascular: Regular rate and rhythm without murmur gallop or rub normal S1 and S2 Abdomen: mildly tender to palpation, nondistended, soft, bowel sounds positive, no rebound, no ascites, no appreciable mass Extremities: No significant cyanosis, clubbing, or edema bilateral lower extremities Skin:  Small scabbed area on the distal anterior tibia area.  Data Reviewed: Basic Metabolic Panel:  Recent Labs Lab 10/26/13 1235 10/27/13 0315 10/28/13 0221 10/30/13 0645  NA 137 141 143 142  K 4.2 3.8 3.5* 4.2  CL 95* 108 110 104  CO2 GLUCOSE 139* 75 103* 84  BUN 31* CREATININE 1.10 0.80 0.81 1.05  CALCIUM  9.6 7.7* 7.7* 8.4  MG  --  2.0  --   --   PHOS  --  2.0*  --   --    Liver Function Tests:  Recent Labs Lab 10/26/13 1235 10/27/13 0315 10/28/13 0221  AST 109* 57* 34  ALT 58* 40 32  ALKPHOS 56 41 40  BILITOT 1.5* 0.7 0.3  PROT 7.7 5.6* 5.2*  ALBUMIN 4.0 2.9* 2.6*   Coags:  Recent Labs Lab 10/27/13 0315  INR  1.18   CBC:  Recent Labs Lab 10/26/13 1235 10/27/13 0315  WBC 12.5* 5.9  NEUTROABS 10.1*  --   HGB 13.8 11.9*  HCT 39.4 33.7*  MCV 88.7 88.5  PLT 340 257   Cardiac Enzymes:  Recent Labs Lab 10/26/13 1235 10/26/13 1541 10/26/13 1935 10/27/13 0315 10/28/13 0221 10/30/13 0645  CKTOTAL 2363* 1695* 1672* 1043* 560* 290*  TROPONINI <0.30  --   --   --   --   --     Recent Results (from the past 240 hour(s))  MRSA PCR SCREENING     Status: Abnormal   Collection Time    10/27/13 12:28 AM      Result Value Ref Range Status   MRSA by PCR POSITIVE (*) NEGATIVE Final   Comment:            The GeneXpert MRSA Assay (FDA     approved for NASAL specimens     only), is one component of a     comprehensive MRSA colonization     surveillance program. It is not     intended to diagnose MRSA     infection nor to guide or     monitor treatment for     MRSA infections.     RESULT CALLED TO, READ BACK BY AND VERIFIED WITH:     [facility] RN [id number] AT 0449 SKEEN,P    Studies:  Recent x-ray studies have been reviewed in detail by the Attending Physician  Scheduled Meds:  Scheduled Meds: . benztropine  2 mg Oral BID  . docusate sodium  100 mg Oral BID  . enoxaparin (LOVENOX) injection  40 mg Subcutaneous Q24H  . folic acid  1 mg Oral Daily  . mupirocin ointment  1 application Nasal BID  . sodium chloride  3 mL Intravenous Q12H  . thiamine  100 mg Oral Daily  . ziprasidone  120 mg Oral QHS    Time spent on care of this patient: 35 mins   York, Tora Kindred , PA-C [facility] If 7PM-7AM, please contact night-coverage [email or web address] Password [email or web address] 10/31/2013, 12:17 PM   LOS: 5 days

## 2013-10-31 NOTE — Progress Notes (Signed)
[facility] MD Progress Note  10/31/2013 12:09 PM Brandon [name]  MRN:  [id number] Subjective:  Patient complaint depression, sadness, problems with the mother and father relationships and ongoing substance abuse versus polysubstance dependence. Patient continued to endorse symptoms of depression and unable to contract for safety. Patient has fair eye contact and able to communicate better today than yesterday. Patient has a poor eye contact when asked about his relationship with his parent's, drug use and suicide attempt. Patient does not appear to be psychotic during this evaluation. Patient is known to have bipolar disorder but noncompliant with this medication management. Patient is a poor insight and appetite and sleep. Patient has no pertinent withdrawal symptoms of opiates/stimulant.  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 rate 18, height 6' 3"  (1.905 m), weight 105.2 kg (231 lb 14.8 oz), SpO2 95.00%.Body mass index is 28.99 kg/(m^2).  General Appearance: Disheveled and Guarded  Eye Contact::  Minimal  Speech:  Slow  Volume:  Decreased  Mood:  Depressed, Hopeless and Worthless  Affect:  Depressed and Flat  Thought Process:  Coherent and Goal Directed  Orientation:  Full (Time, Place, and Person)  Thought Content:  WDL  Suicidal Thoughts:  Yes.  with intent/plan  Homicidal Thoughts:  No  Memory:  Immediate;    Fair Recent;   Fair  Judgement:  Impaired  Insight:  Lacking  Psychomotor Activity:  Psychomotor Retardation  Concentration:  Fair  Recall:  [facility] of Knowledge:Good  Language: Good  Akathisia:  NA  Handed:  Right  AIMS (if indicated):     Assets:  Communication Skills Desire for Improvement Housing Leisure Time Physical Health Resilience Social Support  Sleep:      Musculoskeletal: Strength & Muscle Tone: decreased Gait & Station: unable to stand Patient leans: N/A  Current Medications: Current Facility-Administered Medications  Medication Dose Route Frequency Provider Last Rate Last Dose  . acetaminophen (TYLENOL) tablet 650 mg  650 mg Oral Q6H PRN Toy Baker, MD       Or  . acetaminophen (TYLENOL) suppository 650 mg  650 mg Rectal Q6H PRN Toy Baker, MD      . benztropine (COGENTIN) tablet 2 mg  2 mg Oral BID Toy Baker, MD   2 mg at 10/31/13 1026  . docusate sodium (COLACE) capsule 100 mg  100 mg Oral BID Toy Baker, MD   100 mg at 10/31/13 1026  . enoxaparin (LOVENOX) injection 40 mg  40 mg Subcutaneous Q24H Toy Baker, MD   40 mg at 10/30/13 1349  . folic acid (FOLVITE) tablet 1 mg  1 mg Oral Daily Cherene Altes, MD   1 mg at 10/31/13 1026  . ketorolac (TORADOL) 15 MG/ML injection 15 mg  15 mg Intravenous Q8H PRN Cherene Altes, MD      . LORazepam (ATIVAN) tablet 1 mg  1 mg Oral Q6H PRN Toy Baker, MD   1 mg at 10/31/13 1026  . mupirocin ointment (BACTROBAN) 2 % 1  application  1 application Nasal BID Toy Baker, MD   1 application at 08/65/78 2134  . ondansetron (ZOFRAN) tablet 4 mg  4 mg Oral Q6H PRN Toy Baker, MD       Or  . ondansetron (ZOFRAN) injection 4 mg  4 mg Intravenous Q6H PRN Toy Baker, MD      . sodium chloride 0.9 % injection 3 mL  3 mL Intravenous PRN Cherene Altes, MD   3 mL at 10/29/13 1756  . sodium chloride 0.9 % injection 3 mL  3 mL Intravenous Q12H Cherene Altes, MD   3 mL at 10/30/13 2134  . thiamine (VITAMIN B-1) tablet 100 mg  100 mg Oral Daily Cherene Altes, MD   100 mg at 10/31/13 1026  . traMADol (ULTRAM) tablet 50 mg  50 mg Oral Q6H PRN Cherene Altes, MD   50 mg at 10/31/13 1026  . ziprasidone (GEODON) capsule 120 mg  120 mg Oral QHS Toy Baker, MD   120 mg at 10/30/13 2135    Lab Results:  Results for orders placed during the hospital encounter of 10/26/13 (from the past 48 hour(s))  BASIC METABOLIC PANEL     Status: Abnormal   Collection Time    10/30/13  6:45 AM      Result Value Ref Range   Sodium 142  137 - 147 mEq/L   Potassium 4.2  3.7 - 5.3 mEq/L   Chloride 104  96 - 112 mEq/L   CO2 25  19 - 32 mEq/L   Glucose, Bld 84  70 - 99 mg/dL   BUN 14  6 - 23 mg/dL   Creatinine, Ser 1.05  0.50 - 1.35 mg/dL   Calcium 8.4  8.4 - 10.5 mg/dL   GFR calc non Af Amer 83 (*) >90 mL/min   GFR calc Af Amer >90  >90 mL/min   Comment: (NOTE)     The eGFR has been calculated using the CKD EPI equation.     This calculation has not been validated in all clinical situations.     eGFR's persistently <90 mL/min signify possible Chronic Kidney     Disease.   Anion gap 13  5 - 15  CK     Status: Abnormal   Collection Time    10/30/13  6:45 AM      Result Value Ref Range   Total CK 290 (*) 7 - 232 U/L    Physical Findings: AIMS:  , ,  ,  ,    CIWA:  CIWA-Ar Total: 7 COWS:     Treatment Plan Summary: Daily contact with patient to assess and evaluate symptoms and progress in treatment Medication management  Plan:  Continue current medication management Geodon 120 mg daily at bedtime and Cogentin 2 mg twice daily and monitor for extrapyramidal symptoms and cardiac monitoring as needed. Pending for the acute psychiatric inpatient hospitalization  Medical Decision Making Problem Points:  Established problem, worsening (2), New problem, with no additional work-up planned (3) and Review of psycho-social stressors (1) Data  Points:  Review or order clinical lab tests (1) Review of medication regiment & side effects (2) Review of new medications or change in dosage (2)  I certify that inpatient services furnished can reasonably be expected to improve the patient's condition.   Arjan Strohm,JANARDHAHA R. 10/31/2013, 12:09 PM

## 2013-10-31 NOTE — Evaluation (Signed)
Physical Therapy Evaluation Patient Details Name: Brandon [name] MRN: [id number] DOB: 11-May-1965 Today's Date: 10/31/2013   History of Present Illness  Patient is a 48 y/o male with a long hx of substance abuse with multiple admissions. Pt reportedly was snorting adderall to get high and found wandering the streets actively hallucinating. He was stating that he wanted to "rob someone to get more Adderall to finish the job" Reportedly said he "would have killed anyone who he saw". Patient endorses drinking unknown amount of alcohol. Admitted for dehydration, rhabdomyolysis, and oversedation. PMH of drug abuse; Kidney failure; Bipolar 1 disorder; Rhabdomyolysis; Opiate abuse; Dental caries; Anxiety; Depression; Bipolar affective disorder; and Mood disorder w/ a long hx of substance abuse.   Clinical Impression  Patient presents with functional limitations due to deficits listed in PT problem list (see below). Pt with increased pain in RLE, dizziness and balance deficits limiting safe mobility. Balance improved with use of RW for support. Instructed pt in LE stretches to perform to improve extensibility and allow for more normalized gait pattern. Pt would benefit from skilled acute PT to improve gait, balance and overall safe mobility prior to discharge to decrease fall risk so pt can safely mobilize at psychiatric facility.      Follow Up Recommendations Supervision for mobility/OOB    Equipment Recommendations  Rolling walker with 5" wheels    Recommendations for Other Services       Precautions / Restrictions Precautions Precautions: Fall Precaution Comments: Suicide precautions. Restrictions Weight Bearing Restrictions: No      Mobility  Bed Mobility Overal bed mobility: Modified Independent Bed Mobility: Supine to Sit;Sit to Supine     Supine to sit: Modified independent (Device/Increase time);HOB elevated Sit to supine: Modified independent (Device/Increase time);HOB elevated      Transfers Overall transfer level: Needs assistance Equipment used: Rolling walker (2 wheeled);None Transfers: Sit to/from Stand Sit to Stand: Min guard         General transfer comment: Min guard to stand due to dizziness, and increased sway noted initially. VC to stand until symptoms resolve.  Ambulation/Gait Ambulation/Gait assistance: Min guard Ambulation Distance (Feet): 150 Feet Assistive device: Rolling walker (2 wheeled) Gait Pattern/deviations: Step-to pattern;Decreased stride length;Decreased stance time - right;Decreased step length - left Gait velocity: Decreased   General Gait Details: Pt with toe touch for initial contact during gait on RLE due to pain with placing heel on floor. Increased WB through BUEs to relieve pain through RLE. Unsteady. Ambulated to bathroom without RW holding onto counter for support. Used RW for longer distance gait.  Stairs            Wheelchair Mobility    Modified Rankin (Stroke Patients Only)       Balance Overall balance assessment: Needs assistance   Sitting balance-Leahy Scale: Good Sitting balance - Comments: Total A to donn socks as pt reports he cannot reach down to his feet. Able to reach outside BoS to grab items on tray without difficulty.    Standing balance support: During functional activity Standing balance-Leahy Scale: Poor Standing balance comment: Requires UE support during static and dynamic standing due to dizziness and pain with WB through RLE, balance deficits noted.                              Pertinent Vitals/Pain Pain Assessment: 0-10 Pain Score: 8  Pain Location: right knee Pain Descriptors / Indicators: Sharp;Sore;Aching Pain Intervention(s): Monitored during  session;Repositioned;Patient requesting pain meds-RN notified    Home Living Family/patient expects to be discharged to:: Other (Comment) (Psychiatric admission.)                      Prior Function Level of  Independence: Independent         Comments: Used to live at home with his mother however per report, mother will not let him back home due to drug use. USed to work. Multiple knee surgeries and injuries making walking difficult per pt report.     Hand Dominance        Extremity/Trunk Assessment               Lower Extremity Assessment: RLE deficits/detail;Generalized weakness RLE Deficits / Details: Impaired AROM knee extension, knee flexion and ankle DF secondary to pain.    Cervical / Trunk Assessment: Normal  Communication   Communication: No difficulties  Cognition Arousal/Alertness: Awake/alert Behavior During Therapy: WFL for tasks assessed/performed Overall Cognitive Status: Within Functional Limits for tasks assessed                      General Comments      Exercises Other Exercises Other Exercises: Performed manual stretching of right gastro/hamstring with increased pain/discomfort 45'x2 Other Exercises: Instructed pt in piriformis stretch 45' x2 on BLEs.      Assessment/Plan    PT Assessment Patient needs continued PT services  PT Diagnosis Difficulty walking;Generalized weakness;Acute pain   PT Problem List Decreased strength;Pain;Decreased range of motion;Decreased activity tolerance;Decreased knowledge of use of DME;Decreased safety awareness;Decreased balance;Decreased mobility  PT Treatment Interventions DME instruction;Balance training;Gait training;Patient/family education;Functional mobility training;Therapeutic activities;Therapeutic exercise   PT Goals (Current goals can be found in the Care Plan section) Acute Rehab PT Goals Patient Stated Goal: to get some pain medication for his knee PT Goal Formulation: With patient Time For Goal Achievement: 11/14/13 Potential to Achieve Goals: Fair    Frequency Min 3X/week   Barriers to discharge        Co-evaluation               End of Session Equipment Utilized During  Treatment: Gait belt Activity Tolerance: Patient limited by pain Patient left: in bed;with call bell/phone within reach;with nursing/sitter in room Nurse Communication: Mobility status         Time: 1631-1700 PT Time Calculation (min): 29 min   Charges:   PT Evaluation $Initial PT Evaluation Tier I: 1 Procedure PT Treatments $Gait Training: 8-22 mins   PT G CodesAlvie Heidelberg A 10/31/2013, 5:21 PM Alvie Heidelberg, PT, DPT [phone]

## 2013-11-01 DIAGNOSIS — M6282 Rhabdomyolysis: Secondary | ICD-10-CM

## 2013-11-01 DIAGNOSIS — F1410 Cocaine abuse, uncomplicated: Secondary | ICD-10-CM

## 2013-11-01 NOTE — Progress Notes (Signed)
[facility] PROGRESS NOTE  Brandon [name] ZOX:[id number] DOB: 25-Dec-1965 DOA: 10/26/2013 PCP: No primary provider on file.  Assessment/Plan: Polysubstance abuse w/ amphetamine OD - possible suicide attempt  -Psychiatry consult appreciated.  -Pt reported to MD 9/1 that he may have been trying to harm himself - there are concerns regarding his competence as well -Medically, pt is is cleared in regard to the physiologic effects of his amphetamine OD.  Bipolar d/o  -Psychiatry recommends continuation of Geodon and Benztropine, as well as acute psychiatric hospitalization when a bed is available at [facility].  Rhabdo  -resolved w/ hydration  Dehydration  -Resolved w/ IVF resuscitation  Transaminitis  -Resolved  Acute renal failure  -Resolved   Code Status: Full Family Communication: Pt in room (indicate person spoken with, relationship, and if by phone, the number) Disposition Plan: Pending psych bed availability   Consultants:  Psychiatry  Procedures:    Antibiotics:   (indicate start date, and stop date if known)  HPI/Subjective: No complaints.  Objective: Filed Vitals:   10/31/13 1336 10/31/13 2217 11/01/13 0558 11/01/13 0600  BP: 117/66 113/60 96/63 101/54  Pulse: 56 60 53 54  Temp: 97.6 F (36.4 C) 97.6 F (36.4 C) 97.8 F (36.6 C)   TempSrc: Oral Oral Oral   Resp: Height:      Weight:      SpO2: 97% 97% 95%     Intake/Output Summary (Last 24 hours) at 11/01/13 1322 Last data filed at 11/01/13 0558  Gross per 24 hour  Intake    240 ml  Output      0 ml  Net    240 ml   Filed Weights   10/27/13 0000  Weight: 105.2 kg (231 lb 14.8 oz)    Exam:  General:  Awake, in nad  Cardiovascular: regular, s1, s2  Respiratory: normal resp effort, no wheezing  Abdomen: soft, nondistended  Musculoskeletal: perfused, no clubbing   Data Reviewed: Basic Metabolic Panel:  Recent Labs Lab 10/26/13 1235 10/27/13 0315 10/28/13 0221  10/30/13 0645  NA 137 141 143 142  K 4.2 3.8 3.5* 4.2  CL 95* 108 110 104  CO2 GLUCOSE 139* 75 103* 84  BUN 31* CREATININE 1.10 0.80 0.81 1.05  CALCIUM 9.6 7.7* 7.7* 8.4  MG  --  2.0  --   --   PHOS  --  2.0*  --   --    Liver Function Tests:  Recent Labs Lab 10/26/13 1235 10/27/13 0315 10/28/13 0221  AST 109* 57* 34  ALT 58* 40 32  ALKPHOS 56 41 40  BILITOT 1.5* 0.7 0.3  PROT 7.7 5.6* 5.2*  ALBUMIN 4.0 2.9* 2.6*   No results found for this basename: LIPASE, AMYLASE,  in the last 168 hours No results found for this basename: AMMONIA,  in the last 168 hours CBC:  Recent Labs Lab 10/26/13 1235 10/27/13 0315  WBC 12.5* 5.9  NEUTROABS 10.1*  --   HGB 13.8 11.9*  HCT 39.4 33.7*  MCV 88.7 88.5  PLT 340 257   Cardiac Enzymes:  Recent Labs Lab 10/26/13 1235 10/26/13 1541 10/26/13 1935 10/27/13 0315 10/28/13 0221 10/30/13 0645  CKTOTAL 2363* 1695* 1672* 1043* 560* 290*  TROPONINI <0.30  --   --   --   --   --    BNP (last 3 results) No results found for this basename: PROBNP,  in the last 8760  hours CBG: No results found for this basename: GLUCAP,  in the last 168 hours  Recent Results (from the past 240 hour(s))  MRSA PCR SCREENING     Status: Abnormal   Collection Time    10/27/13 12:28 AM      Result Value Ref Range Status   MRSA by PCR POSITIVE (*) NEGATIVE Final   Comment:            The GeneXpert MRSA Assay (FDA     approved for NASAL specimens     only), is one component of a     comprehensive MRSA colonization     surveillance program. It is not     intended to diagnose MRSA     infection nor to guide or     monitor treatment for     MRSA infections.     RESULT CALLED TO, READ BACK BY AND VERIFIED WITH:     [facility] RN [id number] AT 0449 SKEEN,P     Studies: No results found.  Scheduled Meds: . benztropine  2 mg Oral BID  . docusate sodium  100 mg Oral BID  . enoxaparin (LOVENOX) injection  40 mg Subcutaneous  Q24H  . folic acid  1 mg Oral Daily  . sodium chloride  3 mL Intravenous Q12H  . thiamine  100 mg Oral Daily  . ziprasidone  120 mg Oral QHS   Continuous Infusions:   Active Problems:   TRANSAMINASES, SERUM, ELEVATED   Substance abuse   Overdose by amphetamine   Dehydration   Rhabdomyolysis  Time spent:  CHIU, STEPHEN K  [facility] Pager [phone]. If 7PM-7AM, please contact night-coverage at [email or web address], password [facility] 11/01/2013, 1:22 PM  LOS: 6 days

## 2013-11-01 NOTE — Clinical Social Work Note (Signed)
CSW continues to follow patient for inpatient psychiatric needs.  CSW contacted the following inpatient psychiatric facilities for bed availability.  [facility]- no beds available per Eric, [facility] no beds available  [facility]- no beds available  [facility]- no beds available until Monday, 9/7 per [facility]- no beds available per [facility]   CSW met with patient and made him aware of the above. CSW to follow tomorrow with inpatient psychiatric needs.  [place], [place]  Weekend Clinical Social Worker  [phone]

## 2013-11-01 NOTE — Progress Notes (Signed)
Spoke with MD, ok to take IV out/No IV access at this time.

## 2013-11-02 NOTE — Clinical Social Work Note (Signed)
CSW continues to follow this patient for inpatient psychiatric needs. CSW spoke with Tayra Dawe from [facility] who stated they are currently reviewing patient's clinical for possible admission. CSW to follow tomorrow.  Kamani Magnussen Patrick-Jefferson, LCSWA Weekend Clinical Social Worker [phone]

## 2013-11-02 NOTE — Clinical Social Work Note (Signed)
CSW continues to follow patient for inpatient psychiatric needs.  CSW contacted the following inpatient psychiatric facilities for bed availability.  [facility]- no beds available per Chris, AC  [place] [facility]- spoke with Orelia Brandstetter, beds available, faxed updated clinicals [phone]), pending review of clinicals [facility]- no beds available  [facility]- no beds available until Monday, 9/7 per Chris  [facility]- no beds available  CSW to follow up J[place] Canty of [facility] regarding bed availability for inpatient psychiatric needs.   Hartley Urton Patrick-Jefferson, LCSWA  Weekend Clinical Social Worker  [phone]   

## 2013-11-02 NOTE — Progress Notes (Signed)
[facility] PROGRESS NOTE  Brandon [name] UJW:[id number] DOB: February 21, 1966 DOA: 10/26/2013 PCP: No primary provider on file.  Assessment/Plan: Polysubstance abuse w/ amphetamine OD - possible suicide attempt  -Psychiatry consult appreciated.  -Pt reported to MD 9/1 that he may have been trying to harm himself - there are concerns regarding his competence as well -Medically, pt is is cleared for transfer to inpt psych.  Bipolar d/o  -Psychiatry recommends continuation of Geodon and Benztropine, as well as acute psychiatric hospitalization when a bed is available at [facility].  Rhabdo  -resolved w/ hydration  Dehydration  -Resolved w/ IVF resuscitation  Transaminitis  -Resolved  Acute renal failure  -Resolved   Code Status: Full Family Communication: Pt in room Disposition Plan: Pending inpt psych bed when ready   Consultants:  Psychiatry  Procedures:    Antibiotics:  none  HPI/Subjective: No complaints. No acute events noted overnight  Objective: Filed Vitals:   11/01/13 0600 11/01/13 1436 11/01/13 2134 11/02/13 0550  BP: 101/54 113/57 114/57 105/48  Pulse: 54 69 63 56  Temp:  97.9 F (36.6 C) 97.7 F (36.5 C) 97.8 F (36.6 C)  TempSrc:  Oral Oral Oral  Resp:  Height:      Weight:      SpO2:  98% 97% 98%    Intake/Output Summary (Last 24 hours) at 11/02/13 1358 Last data filed at 11/02/13 1317  Gross per 24 hour  Intake    720 ml  Output      0 ml  Net    720 ml   Filed Weights   10/27/13 0000  Weight: 105.2 kg (231 lb 14.8 oz)    Exam:  General:  Awake, in nad  Cardiovascular: regular, s1, s2  Respiratory: normal resp effort, no wheezing  Abdomen: soft, nondistended  Musculoskeletal: perfused, no clubbing   Data Reviewed: Basic Metabolic Panel:  Recent Labs Lab 10/27/13 0315 10/28/13 0221 10/30/13 0645  NA 141 143 142  K 3.8 3.5* 4.2  CL 108 110 104  CO2 GLUCOSE 75 103* 84  BUN CREATININE 0.80  0.81 1.05  CALCIUM 7.7* 7.7* 8.4  MG 2.0  --   --   PHOS 2.0*  --   --    Liver Function Tests:  Recent Labs Lab 10/27/13 0315 10/28/13 0221  AST 57* 34  ALT 40 32  ALKPHOS 41 40  BILITOT 0.7 0.3  PROT 5.6* 5.2*  ALBUMIN 2.9* 2.6*   No results found for this basename: LIPASE, AMYLASE,  in the last 168 hours No results found for this basename: AMMONIA,  in the last 168 hours CBC:  Recent Labs Lab 10/27/13 0315  WBC 5.9  HGB 11.9*  HCT 33.7*  MCV 88.5  PLT 257   Cardiac Enzymes:  Recent Labs Lab 10/26/13 1541 10/26/13 1935 10/27/13 0315 10/28/13 0221 10/30/13 0645  CKTOTAL 1695* 1672* 1043* 560* 290*   BNP (last 3 results) No results found for this basename: PROBNP,  in the last 8760 hours CBG: No results found for this basename: GLUCAP,  in the last 168 hours  Recent Results (from the past 240 hour(s))  MRSA PCR SCREENING     Status: Abnormal   Collection Time    10/27/13 12:28 AM      Result Value Ref Range Status   MRSA by PCR POSITIVE (*) NEGATIVE Final   Comment:  The GeneXpert MRSA Assay (FDA     approved for NASAL specimens     only), is one component of a     comprehensive MRSA colonization     surveillance program. It is not     intended to diagnose MRSA     infection nor to guide or     monitor treatment for     MRSA infections.     RESULT CALLED TO, READ BACK BY AND VERIFIED WITH:     [facility] RN [id number] AT 0449 SKEEN,P     Studies: No results found.  Scheduled Meds: . benztropine  2 mg Oral BID  . docusate sodium  100 mg Oral BID  . enoxaparin (LOVENOX) injection  40 mg Subcutaneous Q24H  . folic acid  1 mg Oral Daily  . sodium chloride  3 mL Intravenous Q12H  . thiamine  100 mg Oral Daily  . ziprasidone  120 mg Oral QHS   Continuous Infusions:   Active Problems:   TRANSAMINASES, SERUM, ELEVATED   Substance abuse   Overdose by amphetamine   Dehydration   Rhabdomyolysis  Time spent:  Kynlei Piontek  K  [facility] Pager [phone]. If 7PM-7AM, please contact night-coverage at [email or web address], password [facility] 11/02/2013, 1:58 PM  LOS: 7 days
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
## 2013-11-03 DIAGNOSIS — M25569 Pain in unspecified knee: Secondary | ICD-10-CM

## 2013-11-03 NOTE — Clinical Social Work Psych Note (Addendum)
Updated placement search:  CSW has made referral to [facility]  [facility]- at capacity -ED currently has 31 patients awaiting psychiatric placement [place]- at capacity [facility]- at capacity [facility] - at capacity [place]- at capacity per [facility]- at capacity per [facility]- possibly has one bed available referral re-faxed for review fax # [phone] [facility]- left message [facility]- at capacity [place]- possible bed available re-faxed referral to [phone] [place]- at capacity per [place]- at capacity [address] [facility])- at [employer] [facility]- at capacity per Beulah Gandy [facility]) at capacity [place]- at capacity per Abran Cantor, LCSWA [phone]  Psychiatric & Orthopedics (5N 1-16) Clinical Social Worker

## 2013-11-03 NOTE — Progress Notes (Signed)
Physical Therapy Treatment Patient Details Name: Brandon [name] MRN: [id number] DOB: [birth year] Today's Date: 11/03/2013    History of Present Illness Patient is a 48 y/o male with a long hx of substance abuse with multiple admissions. Pt reportedly was snorting adderall to get high and found wandering the streets actively hallucinating. He was stating that he wanted to "rob someone to get more Adderall to finish the job" Reportedly said he "would have killed anyone who he saw". Patient endorses drinking unknown amount of alcohol. Admitted for dehydration, rhabdomyolysis, and oversedation. PMH of drug abuse; Kidney failure; Bipolar 1 disorder; Rhabdomyolysis; Opiate abuse; Dental caries; Anxiety; Depression; Bipolar affective disorder; and Mood disorder w/ a long hx of substance abuse.    PT Comments    Patient progressing well with mobility. Continues to exhibit pain with weight bearing through RLE during gait training and inability to initiate heel strike. Palpable lump located on posterior knee painful to palpation. Due to deficits in ROM/strength and mechanism of injury per pt report - concerned about possible knee injury/ligament injury?? Pt has history of knee surgeries/injuries x5 and reports hearing a pop s/p fall prior to admission. Recommend follow up imaging to assess right knee pain. Spoke with Dr. Rhona Leavens and RN. Will continue to follow and progress as tolerated. Pain is limiting mobility.   Follow Up Recommendations  Supervision for mobility/OOB     Equipment Recommendations  Rolling walker with 5" wheels    Recommendations for Other Services       Precautions / Restrictions Precautions Precautions: Fall Precaution Comments: Suicide precautions. Restrictions Weight Bearing Restrictions: No    Mobility  Bed Mobility Overal bed mobility: Modified Independent Bed Mobility: Supine to Sit;Sit to Supine     Supine to sit: Modified independent (Device/Increase time);HOB  elevated Sit to supine: Modified independent (Device/Increase time);HOB elevated   General bed mobility comments: Uses UEs to assist bringing RLE back into bed due to pain.  Transfers Overall transfer level: Needs assistance Equipment used: Rolling walker (2 wheeled) Transfers: Sit to/from Stand Sit to Stand: Min guard         General transfer comment: Min guard to stand due to dizziness. VC to stand until symptoms resolve however despite instructions pt continues to ambulate.  Ambulation/Gait Ambulation/Gait assistance: Min guard Ambulation Distance (Feet): 150 Feet Assistive device: Rolling walker (2 wheeled) Gait Pattern/deviations: Step-to pattern;Decreased stride length;Decreased stance time - right;Decreased step length - left Gait velocity: Decreased   General Gait Details: Demonstrates toe touch for initial contact RLE during gait secondary to pain and increased knee flexion throughout gait. Not able to place heel on floor. VC for more normalized gait pattern however limited due to pain. Attempted taking a few steps in room without AD and LOB- pt able to regain balance from catching onto counter.   Stairs            Wheelchair Mobility    Modified Rankin (Stroke Patients Only)       Balance     Sitting balance-Leahy Scale: Good Sitting balance - Comments: Able to reach outside BoS to reach for items on tray.   Standing balance support: During functional activity Standing balance-Leahy Scale: Poor Standing balance comment: Requires UE support during static and dynamic standing due to dizziness and pain with WB through RLE. LOB noted without use of UE for support.                    Cognition Arousal/Alertness: Awake/alert Behavior During Therapy:  WFL for tasks assessed/performed Overall Cognitive Status: Within Functional Limits for tasks assessed                      Exercises General Exercises - Lower Extremity Long Arc Quad: Both;5  reps;Seated Hip ABduction/ADduction: Both;5 reps;Seated (resisted.) Hip Flexion/Marching: Right;5 reps;Seated Other Exercises Other Exercises: Performed stretching of right gastro/hamstring in standing position with pt holding onto RW for support.     General Comments General comments (skin integrity, edema, etc.): Palpable lump located on right lateral, posterior knee, painful to palpation with light touch. Pt not able to fully extend right knee and pain with hip abduction and hip flexion.       Pertinent Vitals/Pain Pain Assessment: 0-10 Pain Score:  (not rated on pain scale.) Pain Location: right knee Pain Descriptors / Indicators: Sharp;Sore;Aching;Grimacing;Guarding Pain Intervention(s): Limited activity within patient's tolerance;Premedicated before session;Repositioned;Monitored during session    Home Living                      Prior Function            PT Goals (current goals can now be found in the care plan section) Progress towards PT goals: Progressing toward goals    Frequency       PT Plan Current plan remains appropriate    Co-evaluation             End of Session Equipment Utilized During Treatment: Gait belt Activity Tolerance: Patient limited by pain Patient left: in bed;with call bell/phone within reach;with nursing/sitter in room     Time: 1500-1523 PT Time Calculation (min): 23 min  Charges:  $Gait Training: 8-22 mins $Therapeutic Activity: 8-22 mins                    G CodesAlvie Heidelberg A 11/07/13, 3:46 PM Alvie Heidelberg, PT, DPT [phone]

## 2013-11-03 NOTE — Progress Notes (Addendum)
Patient ID: Brandon [name], male   DOB: 1966/01/17, 48 y.o.   MRN: [id number] [facility] MD Progress Note  11/03/2013 4:07 PM Brandon [name]  MRN:  [id number]  Subjective:  Patient appeared working with PT regarding his knee pain and continue to report being  depression, sadness, relationship problem with mother regarding his ongoing substance abuse versus polysubstance dependence and mixing with wrong crowds. Patient continued to endorse relapsing on drug of abuse even after nine months of rehab. Patients stated that he is not interested in long term rehab any more and asks stabilization of his mood on medication management and work with his parents regarding place to live. Patient has fair eye contact, shaven his beard and has poor insight, judgment and impulse control. Patient does not appear to be psychotic during this evaluation. Patient has history of bipolar disorder but noncompliant with this medication management. Patient has no pertinent withdrawal symptoms of opiates/stimulant. Patients stated that he was given free sample of medication and may have trouble to get filled geodon due to lack of resources.  Diagnosis:   DSM5: SSubstance/Addictive Disorders:  Opioids and stimulant dependence Depressive Disorders:  Disruptive Mood Dysregulation Disorder (296.99) Total Time spent with patient: 30 minutes  Axis I: Bipolar, Depressed, Substance Induced Mood Disorder and Polysubstance dependence  ADL's:  Impaired  Sleep: Fair  Appetite:  Poor  Suicidal Ideation:  Patient has status post intentional overdose as a suicide attempt Homicidal Ideation:  Denied AEB (as evidenced by):  Psychiatric Specialty Exam: Physical Exam  ROS  Blood pressure 115/54, pulse 66, temperature 97.6 F (36.4 C), temperature source Oral, resp. rate 18, height  (1.905 m), weight 105.2 kg (231 lb 14.8 oz), SpO2 100.00%.Body mass index is 28.99 kg/(m^2).  General Appearance: Disheveled and Guarded  Eye Contact::   Minimal  Speech:  Slow  Volume:  Decreased  Mood:  Depressed, Hopeless and Worthless  Affect:  Depressed and Flat  Thought Process:  Coherent and Goal Directed  Orientation:  Full (Time, Place, and Person)  Thought Content:  WDL  Suicidal Thoughts:  Yes.  with intent/plan  Homicidal Thoughts:  No  Memory:  Immediate;   Fair Recent;   Fair  Judgement:  Impaired  Insight:  Lacking  Psychomotor Activity:  Psychomotor Retardation  Concentration:  Fair  Recall:  Fair  Fund of Knowledge:Good  Language: Good  Akathisia:  NA  Handed:  Right  AIMS (if indicated):     Assets:  Communication Skills Desire for Improvement Housing Leisure Time Physical Health Resilience Social Support  Sleep:      Musculoskeletal: Strength & Muscle Tone: decreased Gait & Station: unable to stand Patient leans: N/A  Current Medications: Current Facility-Administered Medications  Medication Dose Route Frequency Provider Last Rate Last Dose  . acetaminophen (TYLENOL) tablet 650 mg  650 mg Oral Q6H PRN Therisa Doyne, MD       Or  . acetaminophen (TYLENOL) suppository 650 mg  650 mg Rectal Q6H PRN Therisa Doyne, MD      . benztropine (COGENTIN) tablet 2 mg  2 mg Oral BID Therisa Doyne, MD   2 mg at 11/03/13 1034  . docusate sodium (COLACE) capsule 100 mg  100 mg Oral BID Therisa Doyne, MD   100 mg at 11/03/13 1034  . enoxaparin (LOVENOX) injection 40 mg  40 mg Subcutaneous Q24H Therisa Doyne, MD   40 mg at 11/03/13 1124  . folic acid (FOLVITE) tablet 1 mg  1 mg Oral Daily Elpidio Eric  Sharon Seller, MD   1 mg at 11/03/13 1034  . ketorolac (TORADOL) tablet 10 mg  10 mg Oral Q6H PRN Jerald Kief, MD   10 mg at 11/03/13 1500  . LORazepam (ATIVAN) tablet 1 mg  1 mg Oral Q6H PRN Therisa Doyne, MD   1 mg at 11/03/13 1034  . ondansetron (ZOFRAN) tablet 4 mg  4 mg Oral Q6H PRN Therisa Doyne, MD       Or  . ondansetron (ZOFRAN) injection 4 mg  4 mg Intravenous Q6H PRN Therisa Doyne, MD      . sodium chloride 0.9 % injection 3 mL  3 mL Intravenous PRN Lonia Blood, MD   3 mL at 10/29/13 1756  . sodium chloride 0.9 % injection 3 mL  3 mL Intravenous Q12H Lonia Blood, MD   3 mL at 11/01/13 1009  . thiamine (VITAMIN B-1) tablet 100 mg  100 mg Oral Daily Lonia Blood, MD   100 mg at 11/03/13 1034  . traMADol (ULTRAM) tablet 50 mg  50 mg Oral Q6H PRN Lonia Blood, MD   50 mg at 11/03/13 1035  . ziprasidone (GEODON) capsule 120 mg  120 mg Oral QHS Therisa Doyne, MD   120 mg at 11/02/13 2203    Lab Results:  No results found for this or any previous visit (from the past 48 hour(s)).  Physical Findings: AIMS:  , ,  ,  ,    CIWA:  CIWA-Ar Total: 7 COWS:     Treatment Plan Summary: Daily contact with patient to assess and evaluate symptoms and progress in treatment Medication management  Plan:  Continue Geodon 120 mg daily at bedtime and Cogentin 2 mg twice daily and monitor for extrapyramidal symptoms and cardiac monitoring as needed. Pending for the acute psychiatric inpatient hospitalization Appreciate psych social service finding a psych bed for safety and crisis stabilization and he may benefit from substance rehab treatment after treatment. May refer to [facility]/ [facility] for better treatment.  Medical Decision Making Problem Points:  Established problem, worsening (2), New problem, with no additional work-up planned (3) and Review of psycho-social stressors (1) Data Points:  Review or order clinical lab tests (1) Review of medication regiment & side effects (2) Review of new medications or change in dosage (2)  I certify that inpatient services furnished can reasonably be expected to improve the patient's condition.   Aolanis Crispen,JANARDHAHA R. 11/03/2013, 4:07 PM

## 2013-11-03 NOTE — Progress Notes (Signed)
[facility] PROGRESS NOTE  Brandon [name] WUJ:[id number] DOB: 06/28/1965 DOA: 10/26/2013 PCP: No primary provider on file.  Assessment/Plan: Polysubstance abuse w/ amphetamine OD - possible suicide attempt  -Psychiatry consult appreciated.  -Pt reported to MD 9/1 that he may have been trying to harm himself - there are concerns regarding his competence as well -Medically, pt is is cleared for transfer to inpt psych.  Bipolar d/o  -Psychiatry recommends continuation of Geodon and Benztropine, as well as acute psychiatric hospitalization when a bed is available at [facility].  Rhabdo  -resolved w/ hydration  Dehydration  -Resolved w/ IVF resuscitation  Transaminitis  -Resolved  Acute renal failure  -Resolved  Knee Pain -Pt reports fall while intoxicated, resulting in "pop" of R knee -Effusion on exam, tenderness -Will obtain MRI of R knee  Code Status: Full Family Communication: Pt in room Disposition Plan: Pending inpt psych bed when ready   Consultants:  Psychiatry  Procedures:    Antibiotics:  none  HPI/Subjective: Reports continued R knee pain post recent fall  Objective: Filed Vitals:   11/02/13 1656 11/02/13 2044 11/03/13 0417 11/03/13 1343  BP: 114/62 133/47 102/75 115/54  Pulse:  67 70 66  Temp:  98.2 F (36.8 C) 97.4 F (36.3 C) 97.6 F (36.4 C)  TempSrc:  Oral Oral Oral  Resp:  Height:      Weight:      SpO2:  96% 99% 100%    Intake/Output Summary (Last 24 hours) at 11/03/13 1618 Last data filed at 11/03/13 1334  Gross per 24 hour  Intake   2030 ml  Output      1 ml  Net   2029 ml   Filed Weights   10/27/13 0000  Weight: 105.2 kg (231 lb 14.8 oz)    Exam:  General:  Awake, in nad  Cardiovascular: regular, s1, s2  Respiratory: normal resp effort, no wheezing  Abdomen: soft, nondistended  Musculoskeletal: perfused, no clubbing, R knee tenderness on exam with effusion  Data Reviewed: Basic Metabolic Panel:  Recent  Labs Lab 10/28/13 0221 10/30/13 0645  NA 143 142  K 3.5* 4.2  CL 110 104  CO2 23 25  GLUCOSE 103* 84  BUN 11 14  CREATININE 0.81 1.05  CALCIUM 7.7* 8.4   Liver Function Tests:  Recent Labs Lab 10/28/13 0221  AST 34  ALT 32  ALKPHOS 40  BILITOT 0.3  PROT 5.2*  ALBUMIN 2.6*   No results found for this basename: LIPASE, AMYLASE,  in the last 168 hours No results found for this basename: AMMONIA,  in the last 168 hours CBC: No results found for this basename: WBC, NEUTROABS, HGB, HCT, MCV, PLT,  in the last 168 hours Cardiac Enzymes:  Recent Labs Lab 10/28/13 0221 10/30/13 0645  CKTOTAL 560* 290*   BNP (last 3 results) No results found for this basename: PROBNP,  in the last 8760 hours CBG: No results found for this basename: GLUCAP,  in the last 168 hours  Recent Results (from the past 240 hour(s))  MRSA PCR SCREENING     Status: Abnormal   Collection Time    10/27/13 12:28 AM      Result Value Ref Range Status   MRSA by PCR POSITIVE (*) NEGATIVE Final   Comment:            The GeneXpert MRSA Assay (FDA     approved for NASAL specimens     only), is one component  of a     comprehensive MRSA colonization     surveillance program. It is not     intended to diagnose MRSA     infection nor to guide or     monitor treatment for     MRSA infections.     RESULT CALLED TO, READ BACK BY AND VERIFIED WITH:     [facility] RN [id number] AT 0449 SKEEN,P     Studies: No results found.  Scheduled Meds: . benztropine  2 mg Oral BID  . docusate sodium  100 mg Oral BID  . enoxaparin (LOVENOX) injection  40 mg Subcutaneous Q24H  . folic acid  1 mg Oral Daily  . sodium chloride  3 mL Intravenous Q12H  . thiamine  100 mg Oral Daily  . ziprasidone  120 mg Oral QHS   Continuous Infusions:   Active Problems:   TRANSAMINASES, SERUM, ELEVATED   Substance abuse   Overdose by amphetamine   Dehydration   Rhabdomyolysis  Time spent:  Brandon [name] K  [facility] Pager [phone]. If 7PM-7AM, please contact night-coverage at [email or web address], password [facility] 11/03/2013, 4:18 PM  LOS: 8 days

## 2013-11-04 ENCOUNTER — Inpatient Hospital Stay (HOSPITAL_COMMUNITY): Payer: Self-pay

## 2013-11-04 ENCOUNTER — Encounter (HOSPITAL_COMMUNITY): Payer: Self-pay

## 2013-11-04 ENCOUNTER — Inpatient Hospital Stay (HOSPITAL_COMMUNITY)
Admission: AD | Admit: 2013-11-04 | Discharge: 2013-11-10 | DRG: 885 | Disposition: A | Discharge status: Home / Self Care | Payer: Federal, State, Local not specified - Other | Source: Intra-hospital | Attending: Psychiatry | Admitting: Psychiatry

## 2013-11-04 DIAGNOSIS — F319 Bipolar disorder, unspecified: Secondary | ICD-10-CM | POA: Diagnosis present

## 2013-11-04 DIAGNOSIS — F1410 Cocaine abuse, uncomplicated: Secondary | ICD-10-CM | POA: Diagnosis present

## 2013-11-04 DIAGNOSIS — F1120 Opioid dependence, uncomplicated: Secondary | ICD-10-CM | POA: Diagnosis present

## 2013-11-04 DIAGNOSIS — G4700 Insomnia, unspecified: Secondary | ICD-10-CM | POA: Diagnosis present

## 2013-11-04 DIAGNOSIS — F321 Major depressive disorder, single episode, moderate: Secondary | ICD-10-CM | POA: Diagnosis present

## 2013-11-04 DIAGNOSIS — D649 Anemia, unspecified: Secondary | ICD-10-CM

## 2013-11-04 DIAGNOSIS — F1994 Other psychoactive substance use, unspecified with psychoactive substance-induced mood disorder: Secondary | ICD-10-CM | POA: Diagnosis present

## 2013-11-04 DIAGNOSIS — F39 Unspecified mood [affective] disorder: Secondary | ICD-10-CM | POA: Diagnosis present

## 2013-11-04 DIAGNOSIS — S83206A Unspecified tear of unspecified meniscus, current injury, right knee, initial encounter: Secondary | ICD-10-CM | POA: Diagnosis present

## 2013-11-04 DIAGNOSIS — F4310 Post-traumatic stress disorder, unspecified: Secondary | ICD-10-CM | POA: Diagnosis present

## 2013-11-04 DIAGNOSIS — M25569 Pain in unspecified knee: Secondary | ICD-10-CM | POA: Diagnosis present

## 2013-11-04 DIAGNOSIS — F411 Generalized anxiety disorder: Secondary | ICD-10-CM | POA: Diagnosis present

## 2013-11-04 DIAGNOSIS — Z8249 Family history of ischemic heart disease and other diseases of the circulatory system: Secondary | ICD-10-CM

## 2013-11-04 DIAGNOSIS — F3170 Bipolar disorder, currently in remission, most recent episode unspecified: Secondary | ICD-10-CM

## 2013-11-04 DIAGNOSIS — F1510 Other stimulant abuse, uncomplicated: Secondary | ICD-10-CM | POA: Diagnosis present

## 2013-11-04 MED ORDER — LORAZEPAM 1 MG PO TABS
1.0000 mg | ORAL_TABLET | Freq: Four times a day (QID) | ORAL | Status: DC | PRN
Start: 2013-11-04 — End: 2013-11-05
  Administered 2013-11-04: 1 mg via ORAL
  Filled 2013-11-04: qty 1

## 2013-11-04 MED ORDER — ALUM & MAG HYDROXIDE-SIMETH 200-200-20 MG/5ML PO SUSP: 30.0000 mL | ORAL | Status: DC | PRN

## 2013-11-04 MED ORDER — ACETAMINOPHEN 325 MG PO TABS: 650.0000 mg | ORAL_TABLET | Freq: Four times a day (QID) | ORAL | Status: DC | PRN

## 2013-11-04 MED ORDER — DSS 100 MG PO CAPS: 100.0000 mg | ORAL_CAPSULE | Freq: Two times a day (BID) | ORAL | Status: DC

## 2013-11-04 MED ORDER — KETOROLAC TROMETHAMINE 10 MG PO TABS: 10.0000 mg | ORAL_TABLET | Freq: Four times a day (QID) | ORAL | Status: DC | PRN

## 2013-11-04 MED ORDER — TRAMADOL HCL 50 MG PO TABS
50.0000 mg | ORAL_TABLET | Freq: Four times a day (QID) | ORAL | Status: DC | PRN
  Administered 2013-11-04 – 2013-11-05 (×2): 50 mg via ORAL
  Filled 2013-11-04 (×2): qty 1

## 2013-11-04 MED ORDER — TRAMADOL HCL 50 MG PO TABS: 50.0000 mg | ORAL_TABLET | Freq: Four times a day (QID) | ORAL | Status: DC | PRN

## 2013-11-04 MED ORDER — MAGNESIUM HYDROXIDE 400 MG/5ML PO SUSP: 30.0000 mL | Freq: Every day | ORAL | Status: DC | PRN

## 2013-11-04 MED ORDER — ZIPRASIDONE HCL 60 MG PO CAPS: 120.0000 mg | ORAL_CAPSULE | Freq: Every day | ORAL | Status: DC

## 2013-11-04 MED ORDER — ONDANSETRON 4 MG PO TBDP: 4.0000 mg | ORAL_TABLET | Freq: Three times a day (TID) | ORAL | Status: DC | PRN

## 2013-11-04 MED ORDER — TRAZODONE HCL 50 MG PO TABS
50.0000 mg | ORAL_TABLET | Freq: Every evening | ORAL | Status: DC | PRN
  Administered 2013-11-04 – 2013-11-09 (×6): 50 mg via ORAL
  Filled 2013-11-04 (×6): qty 1
  Filled 2013-11-04: qty 28
  Filled 2013-11-04 (×8): qty 1
  Filled 2013-11-04: qty 28
  Filled 2013-11-04: qty 1

## 2013-11-04 MED ORDER — ZIPRASIDONE HCL 60 MG PO CAPS
120.0000 mg | ORAL_CAPSULE | Freq: Every day | ORAL | Status: DC
  Administered 2013-11-04 – 2013-11-09 (×6): 120 mg via ORAL
  Filled 2013-11-04 (×7): qty 2
  Filled 2013-11-04: qty 28
  Filled 2013-11-04: qty 2

## 2013-11-04 MED ORDER — LORAZEPAM 1 MG PO TABS: 1.0000 mg | ORAL_TABLET | Freq: Three times a day (TID) | ORAL | Status: DC | PRN

## 2013-11-04 MED ORDER — BENZTROPINE MESYLATE 2 MG PO TABS: 2.0000 mg | ORAL_TABLET | Freq: Two times a day (BID) | ORAL | Status: DC

## 2013-11-04 NOTE — Progress Notes (Signed)
NURSING PROGRESS NOTE  Brandon [name] [id number] Discharge Data: 11/04/2013 4:38 PM Attending Provider: Jerald Kief, MD PCP:No primary provider on file.     [name] to be D/C'd [facility] per MD order. Report given to RN at [facility].    Last Vital Signs:  Blood pressure 118/62, pulse 68, temperature 97.6 F (36.4 C), temperature source Oral, resp. rate 18, height  (1.905 m), weight 105.2 kg (231 lb 14.8 oz), SpO2 98.00%.  Discharge Medication List   Medication List    STOP taking these medications       lurasidone 40 MG Tabs tablet  Commonly known as:  LATUDA      TAKE these medications       acetaminophen 325 MG tablet  Commonly known as:  TYLENOL  Take 2 tablets (650 mg total) by mouth every 6 (six) hours as needed for mild pain (or Fever >/= 101).     benztropine 2 MG tablet  Commonly known as:  COGENTIN  Take 1 tablet (2 mg total) by mouth 2 (two) times daily.     DSS 100 MG Caps  Take 100 mg by mouth 2 (two) times daily.     ketorolac 10 MG tablet  Commonly known as:  TORADOL  Take 1 tablet (10 mg total) by mouth every 6 (six) hours as needed for moderate pain.     LORazepam 1 MG tablet  Commonly known as:  ATIVAN  Take 1 tablet (1 mg total) by mouth every 8 (eight) hours as needed for anxiety (agitation).     traMADol 50 MG tablet  Commonly known as:  ULTRAM  Take 1 tablet (50 mg total) by mouth every 6 (six) hours as needed for severe pain.     ziprasidone 60 MG capsule  Commonly known as:  GEODON  Take 2 capsules (120 mg total) by mouth at bedtime.         Cathlyn Parsons, RN

## 2013-11-04 NOTE — Clinical Social Work Note (Addendum)
Covering CSW met with pt at bedside. Pt signed Voluntary Admission and Consent for Treatment. CSW faxed Voluntary Admission and Consent for Treatment form to [facility].   Pt aware of bed availability at [facility].   CSW attempted to update pt's RN (off unit), Charge RN updated regarding discharge disposition.  RN to call for transportation as noted in Psych CSW previous note.  Lubertha Sayres, MSW, [facility] Licensed Clinical Social Worker [phone] and [phone]

## 2013-11-04 NOTE — Consult Note (Signed)
ORTHOPAEDIC CONSULTATION  REQUESTING PHYSICIAN: Donne Hazel, MD  Chief Complaint: R knee pain  HPI: Brandon [name] is a 48 y.o. male who complains of  Pain in his R knee that is acute on chronic. He twisted it 7 days ago and has had increased pain with weightbearing. Some catching. No locking. He had an injection by his PCP 57month ago that helped until this most recent event.   Past Medical History  Diagnosis Date  . Drug abuse   . Kidney failure   . Bipolar 1 disorder   . Rhabdomyolysis   . Acute renal failure   . Opiate abuse, continuous   . Dental caries     extensive  . Anxiety   . Depression   . Bipolar affective disorder 03/17/2012  . Mood disorder 03/16/2012   Past Surgical History  Procedure Laterality Date  . Knee surgery      x 5   History   Social History  . Marital Status: Single    Spouse Name: N/A    Number of Children: N/A  . Years of Education: N/A   Social History Main Topics  . Smoking status: Never Smoker   . Smokeless tobacco: Never Used  . Alcohol Use: No  . Drug Use: Yes    Special: Oxycodone, Amphetamines, Barbituates, Cocaine, Methamphetamines     Comment: admits to snorting percocet today  . Sexual Activity: Yes    Birth Control/ Protection: None   Other Topics Concern  . None   Social History Narrative   ** Merged History Encounter **       Family History  Problem Relation Age of Onset  . Heart disease Mother   . Heart disease Father    Allergies  Allergen Reactions  . Other Anaphylaxis and Other (See Comments)    [place] kidney and pork & beans    Prior to Admission medications   Medication Sig Start Date End Date Taking? Authorizing Provider  lurasidone (LATUDA) 40 MG TABS tablet Take 40 mg by mouth daily with breakfast.   Yes Historical Provider, MD   Mr Knee Right Wo Contrast  11/04/2013   CLINICAL DATA:  Status post fall.  Right knee pain.  EXAM: MRI OF THE RIGHT KNEE WITHOUT CONTRAST  TECHNIQUE: Multiplanar,  multisequence MR imaging of the knee was performed. No intravenous contrast was administered.  COMPARISON:  Plain films right knee 05/23/2011.  FINDINGS: MENISCI  Medial meniscus: There is a horizontal tear in the posterior horn of the medial meniscus reaching the meniscal undersurface. The body of the medial meniscus is degenerated and diminutive without discrete tear.  Lateral meniscus:  Intact.  LIGAMENTS  Cruciates:  Intact.  Collaterals:  Intact.  CARTILAGE  Patellofemoral: There is a focal fissure in hyaline cartilage at the patellar apex in the midpole.  Medial:  Hyaline cartilage is markedly thinned and irregular.  Lateral:  Minimally degenerated.  Joint:  Trace amount of joint fluid is present.  Popliteal Fossa:  No Baker cyst.  Extensor Mechanism:  Intact.  Bones: Tricompartmental osteophytosis appears worst medially. Mild subchondral edema is present about the medial compartment. No bone contusion or worrisome marrow lesion is identified.  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  Horizontal tear posterior horn medial meniscus.  No evidence of trauma.   Electronically Signed   By: TInge RiseM.D.   On: 11/04/2013 09:57    Positive ROS: All other systems have been reviewed and were  otherwise negative with the exception of those mentioned in the HPI and as above.  Labs cbc No results found for this basename: WBC, HGB, HCT, PLT,  in the last 72 hours  Labs inflam No results found for this basename: ESR, CRP,  in the last 72 hours  Labs coag No results found for this basename: INR, PT, PTT,  in the last 72 hours  No results found for this basename: NA, K, CL, CO2, GLUCOSE, BUN, CREATININE, CALCIUM,  in the last 72 hours  Physical Exam: Filed Vitals:   11/04/13 0528  BP: 113/72  Pulse: 63  Temp: 97.4 F (36.3 C)  Resp: 16   General: Alert, no acute distress Cardiovascular: No pedal edema Respiratory: No cyanosis, no use of accessory  musculature GI: No organomegaly, abdomen is soft and non-tender Skin: No lesions in the area of chief complaint other than those listed below in MSK exam.  Neurologic: Sensation intact distally Psychiatric: Patient is competent for consent with normal mood and affect Lymphatic: No axillary or cervical lymphadenopathy  MUSCULOSKELETAL:  RLE: pain at the medial and lateral joint line. Flexion limited by tightness, otherwise painless motion. NVI distally, compartments soft Other extremities are atraumatic with painless ROM and NVI.  Assessment: R knee pain, medial meniscal tear  Plan: Will obtain standing xrays and consider injection. He has a bed today at [facility] so will do this as an outpatient. Weight Bearing Status: WBAT Recommend NSAID's in the mean time, compressive brace and ice.   F/u with me when available   Edmonia Lynch, D, MD Cell [phone]   11/04/2013 11:06 AM
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
## 2013-11-04 NOTE — Progress Notes (Signed)
Report given to RN at [facility]

## 2013-11-04 NOTE — Progress Notes (Signed)
Pt seen and examined. Agree with above assessment and plan above. Briefly, pt presents with intentional amphetamine OD in an apparent suicide attempt. Pt also noted to have traumatic R knee pain and effusion with MRI now showing R medial meniscal tear. Ortho consulted for further recs. Awaiting inpt psych placement

## 2013-11-04 NOTE — Clinical Social Work Psych Note (Signed)
CSW received notification that pt has a bed at [facility].   Bed assignment 302-2 ready at 3pm. RN to call report to: [phone] Pt to be transported by [facility] transportation: [phone]  Vickii Penna, LCSWA [phone]  Psychiatric & Orthopedics (5N 1-16) Clinical Social Worker

## 2013-11-04 NOTE — Progress Notes (Signed)
[facility] PROGRESS NOTE  Brandon [name] ZOX:[id number] DOB: 1965/08/22 DOA: 10/26/2013 PCP: No primary provider on file.  Assessment/Plan: Polysubstance abuse w/ amphetamine OD - possible suicide attempt  -Psychiatry consult appreciated.  -Pt reported to MD 9/1 that he may have been trying to harm himself - there are concerns regarding his competence as well -Medically, pt is is cleared for transfer to inpt psych.   Bipolar d/o  -Psychiatry recommends continuation of Geodon and Benztropine, as well as acute psychiatric hospitalization when a bed is available at [facility].   Rt Knee Pain with medial meniscus tear on MRI -Pt reports fall while intoxicated, resulting in "pop" of R knee -Effusion on exam, tenderness -MRI shows a horizontal tear in the medial meniscus. -Dr. Lajoyce Corners has operated on right knee in the past.  Patient refuses to see Dr. Lajoyce Corners. -Dr. Renaye Rakers is on for unassigned and we have asked him to evaluate the patient.  Rhabdo  -resolved w/ hydration   Dehydration  -Resolved w/ IVF resuscitation   Transaminitis  -Resolved   Acute renal failure  -Resolved     Code Status: Full Family Communication: Pt in room Disposition Plan: Pending inpt psych bed when ready   Consultants:  Psychiatry  Procedures:    Antibiotics:  none  HPI/Subjective: No other complaints.    Objective: Filed Vitals:   11/03/13 0417 11/03/13 1343 11/03/13 2157 11/04/13 0528  BP: 102/75 115/54 109/59 113/72  Pulse: 70 66 63 63  Temp: 97.4 F (36.3 C) 97.6 F (36.4 C) 98 F (36.7 C) 97.4 F (36.3 C)  TempSrc: Oral Oral Oral Oral  Resp: Height:      Weight:      SpO2: 99% 100%  98%    Intake/Output Summary (Last 24 hours) at 11/04/13 1030 Last data filed at 11/04/13 0900  Gross per 24 hour  Intake   1920 ml  Output      0 ml  Net   1920 ml   Filed Weights   10/27/13 0000  Weight: 105.2 kg (231 lb 14.8 oz)    Exam:  General:  Awake, in  nad  Cardiovascular: regular, s1, s2  Respiratory: normal resp effort, no wheezing  Abdomen: soft, nondistended  Musculoskeletal: perfused, no clubbing, R knee tenderness on exam with effusion  Data Reviewed: Basic Metabolic Panel:  Recent Labs Lab 10/30/13 0645  NA 142  K 4.2  CL 104  CO2 25  GLUCOSE 84  BUN 14  CREATININE 1.05  CALCIUM 8.4   Cardiac Enzymes:  Recent Labs Lab 10/30/13 0645  CKTOTAL 290*   BNP (last 3 results)   Recent Results (from the past 240 hour(s))  MRSA PCR SCREENING     Status: Abnormal   Collection Time    10/27/13 12:28 AM      Result Value Ref Range Status   MRSA by PCR POSITIVE (*) NEGATIVE Final   Comment:            The GeneXpert MRSA Assay (FDA     approved for NASAL specimens     only), is one component of a     comprehensive MRSA colonization     surveillance program. It is not     intended to diagnose MRSA     infection nor to guide or     monitor treatment for     MRSA infections.     RESULT CALLED TO, READ BACK BY AND VERIFIED WITH:  [facility] RN [id number] AT 0449 SKEEN,P     Studies: Mr Knee Right Wo Contrast  11/04/2013   CLINICAL DATA:  Status post fall.  Right knee pain.  EXAM: MRI OF THE RIGHT KNEE WITHOUT CONTRAST  TECHNIQUE: Multiplanar, multisequence MR imaging of the knee was performed. No intravenous contrast was administered.  COMPARISON:  Plain films right knee 05/23/2011.  FINDINGS: MENISCI  Medial meniscus: There is a horizontal tear in the posterior horn of the medial meniscus reaching the meniscal undersurface. The body of the medial meniscus is degenerated and diminutive without discrete tear.  Lateral meniscus:  Intact.  LIGAMENTS  Cruciates:  Intact.  Collaterals:  Intact.  CARTILAGE  Patellofemoral: There is a focal fissure in hyaline cartilage at the patellar apex in the midpole.  Medial:  Hyaline cartilage is markedly thinned and irregular.  Lateral:  Minimally degenerated.  Joint:  Trace amount of  joint fluid is present.  Popliteal Fossa:  No Baker cyst.  Extensor Mechanism:  Intact.  Bones: Tricompartmental osteophytosis appears worst medially. Mild subchondral edema is present about the medial compartment. No bone contusion or worrisome marrow lesion is identified.  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  Horizontal tear posterior horn medial meniscus.  No evidence of trauma.   Electronically Signed   By: Drusilla Kanner M.D.   On: 11/04/2013 09:57    Scheduled Meds: . benztropine  2 mg Oral BID  . docusate sodium  100 mg Oral BID  . enoxaparin (LOVENOX) injection  40 mg Subcutaneous Q24H  . folic acid  1 mg Oral Daily  . sodium chloride  3 mL Intravenous Q12H  . thiamine  100 mg Oral Daily  . ziprasidone  120 mg Oral QHS   Continuous Infusions:   Principal Problem:   Overdose by amphetamine Active Problems:   Acute meniscal tear of right knee   TRANSAMINASES, SERUM, ELEVATED   Substance abuse   Dehydration   Rhabdomyolysis  Time spent:  [place]  [facility] Pager [phone]. If 7PM-7AM, please contact night-coverage at [email or web address], password [email or web address] 11/04/2013, 10:30 AM  LOS: 9 days
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
## 2013-11-04 NOTE — Tx Team (Signed)
Initial Interdisciplinary Treatment Plan   PATIENT STRESSORS: Financial difficulties Marital or family conflict Substance abuse   PROBLEM LIST: Problem List/Patient Goals Date to be addressed Date deferred Reason deferred Estimated date of resolution  Pt. Reports increased depression with SI      Substance abuse                                                 DISCHARGE CRITERIA:  Adequate post-discharge living arrangements Improved stabilization in mood, thinking, and/or behavior Motivation to continue treatment in a less acute level of care Verbal commitment to aftercare and medication compliance  PRELIMINARY DISCHARGE PLAN: Outpatient therapy Participate in family therapy Placement in alternative living arrangements  PATIENT/FAMIILY INVOLVEMENT: This treatment plan has been presented to and reviewed with the patient, LOCKE BARRELL, and/or family member, .  The patient and family have been given the opportunity to ask questions and make suggestions.  Cooper Render 11/04/2013, 7:25 PM

## 2013-11-04 NOTE — Progress Notes (Addendum)
D: Patient in the dayroom on approach.  Patient fixated on anxity and pain medication.  When patient is told the medications he has he states that none of the medication he was getting works.  Patient is becoming acclimated with the unit and talking to peers. Patient denies SI/HI and denies AVH. A: Staff to monitor Q 15 mins for safety.  Encouragement and support offered.  Scheduled medications administered per orders. R: Patient remains safe on the unit.  Patient attended group tonight.  Patient visible on the unit and interacting with peers.  Patient taking administered medications.

## 2013-11-05 ENCOUNTER — Encounter (HOSPITAL_COMMUNITY): Payer: Self-pay | Admitting: Psychiatry

## 2013-11-05 DIAGNOSIS — F1994 Other psychoactive substance use, unspecified with psychoactive substance-induced mood disorder: Secondary | ICD-10-CM

## 2013-11-05 DIAGNOSIS — F39 Unspecified mood [affective] disorder: Secondary | ICD-10-CM

## 2013-11-05 DIAGNOSIS — F1510 Other stimulant abuse, uncomplicated: Secondary | ICD-10-CM | POA: Diagnosis present

## 2013-11-05 DIAGNOSIS — F1910 Other psychoactive substance abuse, uncomplicated: Secondary | ICD-10-CM

## 2013-11-05 DIAGNOSIS — F319 Bipolar disorder, unspecified: Secondary | ICD-10-CM | POA: Diagnosis present

## 2013-11-05 MED ORDER — ENSURE COMPLETE PO LIQD
237.0000 mL | Freq: Two times a day (BID) | ORAL | Status: DC | PRN
  Administered 2013-11-05 – 2013-11-10 (×5): 237 mL via ORAL

## 2013-11-05 MED ORDER — TRAMADOL HCL 50 MG PO TABS
100.0000 mg | ORAL_TABLET | Freq: Four times a day (QID) | ORAL | Status: DC | PRN
  Administered 2013-11-05 – 2013-11-07 (×6): 100 mg via ORAL
  Filled 2013-11-05 (×6): qty 2

## 2013-11-05 MED ORDER — ZOLPIDEM TARTRATE 10 MG PO TABS
10.0000 mg | ORAL_TABLET | Freq: Every evening | ORAL | Status: DC | PRN
  Administered 2013-11-05 – 2013-11-09 (×5): 10 mg via ORAL
  Filled 2013-11-05 (×5): qty 1

## 2013-11-05 MED ORDER — LORAZEPAM 1 MG PO TABS
1.0000 mg | ORAL_TABLET | Freq: Three times a day (TID) | ORAL | Status: DC | PRN
  Administered 2013-11-05: 1 mg via ORAL
  Filled 2013-11-05: qty 1

## 2013-11-05 MED ORDER — CLONAZEPAM 1 MG PO TABS
1.0000 mg | ORAL_TABLET | Freq: Four times a day (QID) | ORAL | Status: DC | PRN
  Administered 2013-11-05 – 2013-11-10 (×18): 1 mg via ORAL
  Filled 2013-11-05 (×18): qty 1

## 2013-11-05 NOTE — Progress Notes (Signed)
Patient ID: Brandon [name], male   DOB: 12-03-1965, 48 y.o.   MRN: [id number] D: Client c/o pain right knee, history of torn meniscus, fell when intoxicated prior to admission. Client c/o anxiety due to pain. A: Writer introduced self to client,assessed pain level, administered Klonopin 1 mg and Ultram 10 mg po (see MAR). Writer to assess for relief in an hour. Staff will maintain safety checks q15 min. R: Client is safe on the unit, noted interacting with peers in dayroom and watching TV.
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
## 2013-11-05 NOTE — Progress Notes (Signed)
D: Patient presents with anxious affect and mood; depression as well. Declined self inventory sheet today. He reported that he's been " feeling zonked out most of the day". Patient attended afternoon groups and visible in the milieu. Adheres to current PRN medication regimen.  A: Support and encouragement provided to patient. PRN Tramadol and Klonopin administered to patient for pain and anxiety. Monitor Q15 minute checks for safety.  R: Patient receptive. Denies SI/HI and auditory/visual hallucinations. Patient remains safe on the unit.
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
## 2013-11-05 NOTE — [facility] Group Notes (Signed)
   [facility] LCSW Aftercare Discharge Planning Group Note  11/05/2013  8:45 AM   Participation Quality: Alert, Appropriate and Oriented  Mood/Affect: Depressed and Flat  Depression Rating: 4  Anxiety Rating: 10  Thoughts of Suicide: Pt denies SI, endorses thoughts of harming "a couple of people on the outside" but did not identify anyone specifically  Will you contract for safety? Yes  Current AVH: Pt denies  Plan for Discharge/Comments: Pt attended discharge planning group and actively participated in group. CSW provided pt with today's workbook. Patient reports that he is "okay" this morning. Presented with flat and depressed affect. Denies SI but endorses thoughts of harming "a couple of people on the outside" but did not identify anyone specifically. He receives his medications at [facility] and is considering the suggestion of receiving counseling services at agency as well. Patient reports that he does not have a place to live when he discharges.  Transportation Means: CSW continuing to assess  Supports: No supports mentioned at this time  Samuella Bruin, MSW, [employer] Clinical Social Worker [employer] [phone]

## 2013-11-05 NOTE — [facility] Suicide Risk Assessment (Signed)
[facility] INPATIENT:  Family/Significant Other Suicide Prevention Education  Suicide Prevention Education:  Patient Refusal for Family/Significant Other Suicide Prevention Education: The patient Brandon [name] has refused to provide written consent for family/significant other to be provided Family/Significant Other Suicide Prevention Education during admission and/or prior to discharge.  Physician notified.  SPE completed with pt. SPI pamphlet provided to pt and he was encouraged to share information with support network, ask questions, and talk about any concerns relating to SPE.  Smart, Arnez Stoneking LCSWA  11/05/2013, 11:19 AM
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
## 2013-11-06 ENCOUNTER — Ambulatory Visit (HOSPITAL_COMMUNITY)
Admit: 2013-11-06 | Discharge: 2013-11-06 | Disposition: A | Discharge status: Home / Self Care | Payer: Federal, State, Local not specified - Other | Attending: Psychiatry | Admitting: Psychiatry

## 2013-11-06 ENCOUNTER — Other Ambulatory Visit (HOSPITAL_COMMUNITY): Payer: Self-pay

## 2013-11-06 DIAGNOSIS — F319 Bipolar disorder, unspecified: Secondary | ICD-10-CM

## 2013-11-06 MED ORDER — KETOROLAC TROMETHAMINE 10 MG PO TABS
10.0000 mg | ORAL_TABLET | Freq: Four times a day (QID) | ORAL | Status: DC | PRN
  Administered 2013-11-06 – 2013-11-07 (×2): 10 mg via ORAL
  Filled 2013-11-06 (×2): qty 1

## 2013-11-06 NOTE — Progress Notes (Signed)
[facility] MD Progress Note  11/06/2013 5:49 PM Brandon [name]  MRN:  [id number] Subjective:  Green endorses persistent pain in his knee. He was seen by ortho when he was in the medical unit and a standing x ray was recommended. He is bing considered for another surgery. He still endorses anxiety, worry as well as mood inestbaility. The klonopin is helping some the anxiety. The Ultram is not completely taking care of the pain Diagnosis:   DSM5: Trauma-Stressor Disorders:  Posttraumatic Stress Disorder (309.81) Substance/Addictive Disorders:  Amphetamine Abuse Depressive Disorders:  Major Depressive Disorder - Mild (296.21) Total Time spent with patient: 30 minutes  Axis I: Bipolar Disorder  ADL's:  Intact  Sleep: Fair  Appetite:  Fair  Psychiatric Specialty Exam: Physical Exam  Review of Systems  HENT: Negative.   Eyes: Negative.   Respiratory: Negative.   Cardiovascular: Negative.   Gastrointestinal: Negative.   Musculoskeletal: Positive for joint pain.  Skin: Negative.   Neurological: Positive for weakness.  Endo/Heme/Allergies: Negative.   Psychiatric/Behavioral: Positive for substance abuse. The patient is nervous/anxious.     Blood pressure 97/54, pulse 75, temperature 97.5 F (36.4 C), temperature source Oral, resp. rate 18, height  (1.905 m), weight 105.235 kg (232 lb).Body mass index is 29 kg/(m^2).  General Appearance: Fairly Groomed  Patent attorney::  Fair  Speech:  Clear and Coherent and Slow  Volume:  fluctuates  Mood:  Anxious and worried, in pain  Affect:  anxious, worried  Thought Process:  Coherent and Goal Directed  Orientation:  Full (Time, Place, and Person)  Thought Content:  symptoms worries concerns  Suicidal Thoughts:  No  Homicidal Thoughts:  No  Memory:  Immediate;   Fair Recent;   Fair Remote;   Fair  Judgement:  Fair  Insight:  Present, superficial  Psychomotor Activity:  Restlessness  Concentration:  Fair  Recall:  [employer] of  Knowledge:Fair  Language: Fair  Akathisia:  No  Handed:    AIMS (if indicated):     Assets:  Desire for Improvement  Sleep:  Number of Hours: 4.75   Musculoskeletal: Strength & Muscle Tone: decreased Gait & Station: affected by pain in his knee Patient leans: N/A  Current Medications: Current Facility-Administered Medications  Medication Dose Route Frequency Provider Last Rate Last Dose  . acetaminophen (TYLENOL) tablet 650 mg  650 mg Oral Q6H PRN Nehemiah Massed, MD      . alum & mag hydroxide-simeth (MAALOX/MYLANTA) 200-200-20 MG/5ML suspension 30 mL  30 mL Oral Q4H PRN Nehemiah Massed, MD      . clonazePAM [facility]) tablet 1 mg  1 mg Oral Q6H PRN Rachael Fee, MD   1 mg at 11/06/13 1155  . feeding supplement (ENSURE COMPLETE) (ENSURE COMPLETE) liquid 237 mL  237 mL Oral BID PRN Jeoffrey Massed, RD   237 mL at 11/06/13 1457  . ketorolac (TORADOL) tablet 10 mg  10 mg Oral Q6H PRN Rachael Fee, MD      . magnesium hydroxide (MILK OF MAGNESIA) suspension 30 mL  30 mL Oral Daily PRN Nehemiah Massed, MD      . ondansetron (ZOFRAN-ODT) disintegrating tablet 4 mg  4 mg Oral Q8H PRN Nehemiah Massed, MD      . traMADol Janean Sark) tablet 100 mg  100 mg Oral Q6H PRN Rachael Fee, MD   100 mg at 11/06/13 1155  . traZODone (DESYREL) tablet 50 mg  50 mg Oral QHS,MR X 1 Nehemiah Massed, MD  50 mg at 11/04/13 2236  . ziprasidone (GEODON) capsule 120 mg  120 mg Oral QHS Nehemiah Massed, MD   120 mg at 11/05/13 2210  . zolpidem (AMBIEN) tablet 10 mg  10 mg Oral QHS PRN Rachael Fee, MD   10 mg at 11/05/13 2210    Lab Results: No results found for this or any previous visit (from the past 48 hour(s)).  Physical Findings: AIMS: Facial and Oral Movements Muscles of Facial Expression: None, normal Lips and Perioral Area: None, normal Jaw: None, normal Tongue: None, normal,Extremity Movements Upper (arms, wrists, hands, fingers): None, normal Lower (legs, knees, ankles, toes): None, normal, Trunk  Movements Neck, shoulders, hips: None, normal, Overall Severity Severity of abnormal movements (highest score from questions above): None, normal Incapacitation due to abnormal movements: None, normal Patient's awareness of abnormal movements (rate only patient's report): No Awareness, Dental Status Current problems with teeth and/or dentures?: No Does patient usually wear dentures?: No  CIWA:  CIWA-Ar Total: 10 COWS:  COWS Total Score: 9  Treatment Plan Summary: Daily contact with patient to assess and evaluate symptoms and progress in treatment Medication management  Plan: Supportive approach/coping skills/relapse prevention           Add Toradol as part of the pain management (reports good results with the Ultram-Toradol combo)           Order the X-ray as per Dr. Eulah Pont           Mood stabiliztion  Medical Decision Making Problem Points:  Established problem, worsening (2) and Review of psycho-social stressors (1) Data Points:  Review of medication regiment & side effects (2) Review of new medications or change in dosage (2)  I certify that inpatient services furnished can reasonably be expected to improve the patient's condition.   Palyn Scrima A 11/06/2013, 5:49 PM

## 2013-11-06 NOTE — Progress Notes (Signed)
D: Patient appropriate and cooperative with staff and peers. Patient has anxious affect and mood. He reported on the self inventory sheet that sleep, appetite and ability to concentrate are good and energy level normal. Patient rated depression/feelings of hopelessness "2" and anxiety "7". His goal is to get through the day in a positive way.  A: Support and encouragement provided to patient. PRN Klonopin and Tramadol given to the patient for anxiety and RT knee pain. Maintain Q15 minute checks for safety.  R: Patient receptive. Denies SI/HI and AVH. Patient remains safe.

## 2013-11-06 NOTE — Progress Notes (Signed)
D   Pt is pleasant on approach and very social with peers   He can be loud and gregarious at times    He is compliant with treatment and attends groups A   Verbal support given   Medications administered and effectiveness monitored   Q 15 min checks R   Pt is safe at present

## 2013-11-06 NOTE — Progress Notes (Signed)
Adult Psychoeducational Group Note  Date:  11/06/2013 Time:  9:27 PM  Group Topic/Focus:  Wrap-Up Group:   The focus of this group is to help patients review their daily goal of treatment and discuss progress on daily workbooks.  Participation Level:  Active  Participation Quality:  Appropriate and Attentive  Affect:  Appropriate  Cognitive:  Alert and Appropriate  Insight: Appropriate and Good  Engagement in Group:  Engaged  Modes of Intervention:  Activity  Additional Comments:  Pt was in group and engaged in group... Said he was excited to see things go to the next step in his life   Shaunita Seney R 11/06/2013, 9:27 PM

## 2013-11-06 NOTE — [facility] Group Notes (Signed)
[facility] LCSW Group Therapy 11/06/2013 1:15 PM Type of Therapy: Group Therapy Participation Level: Active  Participation Quality: Attentive, Sharing and Supportive  Affect: Depressed and Flat  Cognitive: Alert and Oriented  Insight: Developing/Improving and Engaged  Engagement in Therapy: Developing/Improving and Engaged  Modes of Intervention: Activity, Clarification, Confrontation, Discussion, Education, Exploration, Limit-setting, Orientation, Problem-solving, Rapport Building, Reality Testing, Socialization and Support  Summary of Progress/Problems: Patient was attentive and engaged with speaker from [employer]. Patient was attentive to speaker while they shared their story of dealing with mental health and overcoming it. Patient expressed interest in their programs and services and received information on their agency. Patient processed ways they can relate to the speaker.   Brandon [name], MSW, LCSWA Clinical Social Worker [facility] [phone]   

## 2013-11-06 NOTE — [facility] Group Notes (Signed)
[facility] Group Notes:  (Nursing/MHT/Case Management/Adjunct)  Date:  11/06/2013  Time:  10:26 AM  Type of Therapy:  Nurse Education  Participation Level:  Active  Participation Quality:  Appropriate  Affect:  Appropriate  Cognitive:  Alert and Appropriate  Insight:  Appropriate  Engagement in Group:  Engaged  Modes of Intervention:  Problem-solving and Support  Summary of Progress/Problems: Morning wellness group  Brandon [name] 11/06/2013, 10:26 AM

## 2013-11-07 ENCOUNTER — Telehealth (HOSPITAL_BASED_OUTPATIENT_CLINIC_OR_DEPARTMENT_OTHER): Payer: Self-pay | Admitting: *Deleted

## 2013-11-07 ENCOUNTER — Ambulatory Visit (HOSPITAL_COMMUNITY): Payer: Self-pay

## 2013-11-07 MED ORDER — KETOROLAC TROMETHAMINE 10 MG PO TABS
10.0000 mg | ORAL_TABLET | Freq: Four times a day (QID) | ORAL | Status: DC | PRN
  Administered 2013-11-07 – 2013-11-10 (×9): 10 mg via ORAL
  Filled 2013-11-07 (×7): qty 1

## 2013-11-07 MED ORDER — TRAMADOL HCL 50 MG PO TABS
100.0000 mg | ORAL_TABLET | Freq: Four times a day (QID) | ORAL | Status: DC | PRN
  Administered 2013-11-07: 100 mg via ORAL
  Filled 2013-11-07: qty 2

## 2013-11-07 MED ORDER — TRAMADOL HCL 50 MG PO TABS
100.0000 mg | ORAL_TABLET | Freq: Four times a day (QID) | ORAL | Status: DC | PRN
  Administered 2013-11-07 – 2013-11-10 (×11): 100 mg via ORAL
  Filled 2013-11-07 (×11): qty 2
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
## 2013-11-07 NOTE — Progress Notes (Signed)
D: Patient presents with anxious affect and mood. He declined self inventory sheet today. Patient has been actively participating in groups, coloring and drawing in the medication/consult room and speaking with peers in the dayroom. Complained of anxiety and pain of the RT knee.  A: Support and encouragement provided to patient. Administered both Tramadol and Toradol for pain periodically through the shift and Klonopin for anxiety. Monitor Q15 minute checks for safety.  R: Patient receptive. Denies SI/HI. Patient remains safe on the unit.

## 2013-11-08 NOTE — Progress Notes (Signed)
Adult Psychoeducational Group Note  Date:  11/08/2013 Time:  10:12 PM  Group Topic/Focus:  Wrap-Up Group:   The focus of this group is to help patients review their daily goal of treatment and discuss progress on daily workbooks.  Participation Level:  Active  Participation Quality:  Appropriate  Affect:  Appropriate  Cognitive:  Appropriate  Insight: Appropriate  Engagement in Group:  Engaged  Modes of Intervention:  Discussion  Additional Comments: The patient expressed that he has been angry with staff today.The patient also said that he felt disrespected by staff.  Brandon [name] 11/08/2013, 10:12 PM

## 2013-11-08 NOTE — Progress Notes (Signed)
Patient ID: Brandon [name], male   DOB: 07-Aug-1965, 48 y.o.   MRN: [id number] D: Patient in room on approach. Pt mood and affect appeared depressed and anxious. Pt restless but calm. Pt goal today is to stay on his medication. Pt denies SI/HI/AVH. Pt attended evening AA group and engage in discussion. Cooperative with assessment. No acute distressed noted at this time.   A: Met with pt 1:1. Medications administered as prescribed. Writer encouraged pt to discuss feelings. Pt encouraged to come to staff with any question or concerns.   R: Patient remains safe. He is complaint with medications and denies any adverse reaction. Continue current POC.

## 2013-11-08 NOTE — Progress Notes (Signed)
.  Psychoeducational Group Note    Date: 11/08/2013 Time:  0930   Goal Setting Purpose of Group: To be able to set [name] goal that is measurable and that can be accomplished in one day Participation Level:  Did not attend  Brandon [name]  

## 2013-11-08 NOTE — Progress Notes (Signed)
D:  Per pt self inventory pt reports sleeping fair, appetite good, energy level low, ability to pay attention good, rates depression at a 2 out of 10 and hopelessness at a 1 out of 10, anxiety at an 8 out of 10, denies SI/HI/AVH presently, goal for today is to "get through the day" and his plan to complete this goal is to "participate", pt is cooperative, has frequent for prn medications.      A:  Emotional support provided, Encouraged pt to continue with treatment plan and attend all group activities, q15 min checks maintained for safety.  R:  Pt is receptive, going to groups, intrusive with staff and other patients.
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
## 2013-11-08 NOTE — [facility] Group Notes (Signed)
(  LATE ENTRY FROM 11/07/13)              [facility] LCSW Group Therapy 11/07/13  1:15 PM   Type of Therapy: Group Therapy  Participation Level: Did Not Attend.   Samuella Bruin, MSW, [employer] Clinical Social Worker [facility] [phone]
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
## 2013-11-08 NOTE — [facility] Group Notes (Addendum)
(  LATE ENTRY FROM 11/07/13)  [facility] LCSW Aftercare Discharge Planning Group Note  11/07/13  8:45 AM   Participation Quality: Alert, Appropriate and Oriented  Mood/Affect: Depressed and Flat  Depression Rating: 0  Anxiety Rating: 8  Thoughts of Suicide: Pt denies SI, does endorse passive thoughts of harming others but will not provide identifying information. Patient denies intention to act on his anger towards these people.  Will you contract for safety? Yes  Current AVH: Pt denies  Plan for Discharge/Comments: Pt attended discharge planning group and actively participated in group. CSW provided pt with today's workbook. Patient reports feeling "alright, tired, and bored" today. He denies SI but does express thoughts of harming others. CSW explored these thoughts with patient and informed him of duty to warn. Patient reports that he feels angry towards several people that he did not identify to CSW but denies intention to act on his anger. Patient would not disclose the identity of people that he feels anger towards to CSW. Patient denies depression and expresses a high level of anxiety. Patient follow up with [facility] for outpatient services at discharge.  Transportation Means: CSW continuing to assess.  Supports: No supports mentioned at this time  Samuella Bruin, MSW, [employer] Clinical Social Worker [facility] [phone]
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
## 2013-11-08 NOTE — [facility] Group Notes (Signed)
[facility] Group Notes:  (Clinical Social Work)  11/08/2013     10-11AM  Summary of Progress/Problems:   The main focus of today's process group was for the patient to identify a problematic behavior that contributed to the reason they needed to be in the hospital, then to consider where they are in the Stages of Change with regard to that behavior.   Motivational Interviewing and a worksheet were utilized to help patients explore in depth the perceived benefits and costs of their self-sabotage behaviors.    The patient expressed that he is in the hospital with suicidal and homicidal ideation, and that he has impulsivity issues.  He stated he keeps going back and forth between Maintenance and Relapse.  CSW pointed out that once he has gone through Relapse he does not immediately go back to Maintenance, and he would use the information about how he relapsed to inform an improved Preparation Stage.  He stated that this is not possible, because something just snaps in him and he does not know when it is coming, saying that there are no warning signs.  The group pointed out to him his impulsivity several days ago when he wanted to leave the hospital and was threatening to throw a chair through a window, and how that was an impulse but he worked through it.  He told of how he just had a relapse "because I was trying to help a friend" and ended up using the medication he bought for the friend on himself instead.  CSW suggested that he use that knowledge to come up with a plan on how to address such requests for assistance in the future.  He appeared open to thinking about possible strategies to do this.  Type of Therapy:  Group Therapy - Process   Participation Level:  Active  Participation Quality:  Attentive and Sharing  Affect:  Blunted and Defensive  Cognitive:  Alert  Insight:  Improving and Resistant  Engagement in Therapy:  Improving  Modes of Intervention:  Education, Processing, Motivational  Interviewing  Ambrose Mantle, LCSW 11/08/2013, 12:21 PM

## 2013-11-09 MED ORDER — CYANOCOBALAMIN 1000 MCG/ML IJ SOLN
1000.0000 ug | Freq: Once | INTRAMUSCULAR | Status: AC
  Administered 2013-11-09: 1000 ug via INTRAMUSCULAR
  Filled 2013-11-09: qty 1

## 2013-11-09 NOTE — Progress Notes (Signed)
Patient ID: Brandon [name], male   DOB: [birth year], 48 y.o.   MRN: [id number]   Type of Therapy:  Psychoeducational Skills  Participation Level:  Active  Participation Quality:  Appropriate  Affect:  Appropriate  Cognitive:  Appropriate  Insight:  Appropriate  Engagement in Group:  Engaged  Modes of Intervention:  Discussion  Summary of Progress/Problems: Pt did attend self inventory group, pt reported that he was negative SI/HI, no AH/VH noted. Pt rated his depression as a 2, and his helplessness/hopelessness as a 0.     Pt reported concerns about being sluggish, pt advised that the doctor will be made aware. Fredna Dow NP was made aware, new orders noted.

## 2013-11-09 NOTE — [facility] Group Notes (Signed)
[facility] Group Notes:  (Clinical Social Work)  11/09/2013  10:00-11:00AM  Summary of Progress/Problems:   The main focus of today's process group was to   identify the patient's current support system and decide on other supports that can be put in place.   An emphasis was placed on using counselor, doctor, therapy groups, 12-step groups, and problem-specific support groups to expand supports.   There was also an extensive discussion about what constitutes a healthy support versus an unhealthy support.  The patient was late to group, left several times, and returned.  He did not interact in the discussion.  Type of Therapy:  Process Group with Motivational Interviewing  Participation Level:  Minimal  Participation Quality:  Inattentive  Affect:  Blunted and Depressed  Cognitive:  Oriented  Insight:  Limited  Engagement in Therapy:  Limited  Modes of Intervention:   Education, Support and Processing, Activity  [employer], LCSW 11/09/2013, 12:15pm
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
## 2013-11-09 NOTE — Progress Notes (Addendum)
D.  Pt extremely upset upon approach, asked to speak to staff about situation with peer that complained about him on day shift.  Pt states that peer initiated contact with him, initiated hugs from him, and asked for his phone number on the outside once he's discharged.  Pt states he feels very confused and does not understand what he did wrong.  Pt states that peer was "my best friend for the last five days here, what happened?"  A.  Explained to Pt that this is the very reason that we have rules enforcing boundaries and that all Pt's are here for many and varied issues.  Expressed that "comforting" actions may be interpreted differently by peer.  Explained to Pt that it is important to concentrate on developing his own coping strategies and not those of peers while here.  R.  Pt spoke over staff for most of conversation and ruminated on being depressed and not knowing who to be friendly with on unit anymore.  Pt states he wishes to be discharged tomorrow because being here is increasing his depression now since this event occurred.  Will continue to monitor.  Pt remains safe on unit.  Pt stated, that he did not feel up to attending evening wrap up group, but then ended up going.  0402  Pt states that he would like to be able to apologize to peer that complained about him.  Encouraged Pt to wait until doctor is here and discuss this with him.  Pt is agreeable to do this.

## 2013-11-09 NOTE — Progress Notes (Signed)
Patient ID: Brandon [name], male   DOB: 1966/02/27, 48 y.o.   MRN: [id number] [facility] MD Progress Note  11/09/2013 10:06 AM [name]  MRN:  [id number] Subjective:  Patient states " I am very drowsy. I didn't sleep well. There is no in between me being angry, to me being very nice. I am a [place] respect is the biggest thing in my culture. I helped a young man in the hallway yesterday who was crying. I stopped and took my time but I couldn't help him, so I went to get him help. That made me feel good, that I was able to help someone. I have a lot of things to worry about that includes my health, where I am going to live, my dog, my parents, and my fathers health." He is also inquiring about a B12 shot, that he normally receives when he is fatigue and low energy levels. Currently rates his depression 2/10, anxiety 8.5/10, and hopelessness 0-1/10. Denies SI/HI/AVH.   Objective:  Patient is seen and chart is reviewed. He is active on the unit and attending the scheduled groups. Rates anxiety at eight.  He is worrying constantly about multiple stressors in his life. Patient is compliant with treatment and denies adverse reactions to medications. He reports that the Klonopin helps with his anxiety. Patient expresses awareness to use caution with that medication as it can be habit forming.   Diagnosis:   DSM5: Trauma-Stressor Disorders:  Posttraumatic Stress Disorder (309.81) Substance/Addictive Disorders:  Amphetamine Abuse, Opioid Dependence, Cocaine Abuse Depressive Disorders:  Major Depressive Disorder - Mild (296.21) Total Time spent with patient: 20 minutes  Axis I: Bipolar Disder Unspecified  ADL's:  Intact  Sleep: Fair  Appetite:  Good  Psychiatric Specialty Exam: Physical Exam   Review of Systems  Constitutional: Negative.   HENT: Negative.   Eyes: Negative.   Respiratory: Negative.   Cardiovascular: Negative.   Gastrointestinal: Negative.   Genitourinary: Negative.    Musculoskeletal: Positive for joint pain.  Skin: Negative.   Neurological: Negative.   Endo/Heme/Allergies: Negative.   Psychiatric/Behavioral: Positive for depression. The patient is nervous/anxious and has insomnia.     Blood pressure 120/70, pulse 66, temperature 97.4 F (36.3 C), temperature source Oral, resp. rate 20, height  (1.905 m), weight 105.235 kg (232 lb).Body mass index is 29 kg/(m^2).  General Appearance: Fairly Groomed  Patent attorney::  Fair  Speech:  Clear and Coherent and rapid  Volume:  fluctuates  Mood:  Anxious  Affect:  Full Range  Thought Process:  Coherent and Goal Directed  Orientation:  Full (Time, Place, and Person)  Thought Content:  Rumination  Suicidal Thoughts:  No  Homicidal Thoughts:  No  Memory:  Immediate;   Fair Recent;   Fair Remote;   Fair  Judgement:  Fair  Insight:  Present  Psychomotor Activity:  Restlessness  Concentration:  Fair  Recall:  [employer] of Knowledge:NA  Language: Fair  Akathisia:  No  Handed:    AIMS (if indicated):     Assets:  Communication Skills Desire for Improvement Leisure Time Resilience  Sleep:  Number of Hours: 5   Musculoskeletal: Strength & Muscle Tone: within normal limits Gait & Station: normal Patient leans: N/A  Current Medications: Current Facility-Administered Medications  Medication Dose Route Frequency Provider Last Rate Last Dose  . acetaminophen (TYLENOL) tablet 650 mg  650 mg Oral Q6H PRN Nehemiah Massed, MD      . alum & mag hydroxide-simeth (MAALOX/MYLANTA)  200-200-20 MG/5ML suspension 30 mL  30 mL Oral Q4H PRN Nehemiah Massed, MD      . clonazePAM [facility]) tablet 1 mg  1 mg Oral Q6H PRN Rachael Fee, MD   1 mg at 11/09/13 0433  . feeding supplement (ENSURE COMPLETE) (ENSURE COMPLETE) liquid 237 mL  237 mL Oral BID PRN Jeoffrey Massed, RD   237 mL at 11/08/13 0323  . ketorolac (TORADOL) tablet 10 mg  10 mg Oral Q6H PRN Rachael Fee, MD   10 mg at 11/09/13 0433  . magnesium  hydroxide (MILK OF MAGNESIA) suspension 30 mL  30 mL Oral Daily PRN Nehemiah Massed, MD      . ondansetron (ZOFRAN-ODT) disintegrating tablet 4 mg  4 mg Oral Q8H PRN Nehemiah Massed, MD      . traMADol Janean Sark) tablet 100 mg  100 mg Oral Q6H PRN Rachael Fee, MD   100 mg at 11/09/13 0433  . traZODone (DESYREL) tablet 50 mg  50 mg Oral QHS,MR X 1 Nehemiah Massed, MD   50 mg at 11/08/13 2306  . ziprasidone (GEODON) capsule 120 mg  120 mg Oral QHS Nehemiah Massed, MD   120 mg at 11/08/13 2144  . zolpidem (AMBIEN) tablet 10 mg  10 mg Oral QHS PRN Rachael Fee, MD   10 mg at 11/08/13 2144    Lab Results: No results found for this or any previous visit (from the past 48 hour(s)).  Physical Findings: AIMS: Facial and Oral Movements Muscles of Facial Expression: None, normal Lips and Perioral Area: None, normal Jaw: None, normal Tongue: None, normal,Extremity Movements Upper (arms, wrists, hands, fingers): None, normal Lower (legs, knees, ankles, toes): None, normal, Trunk Movements Neck, shoulders, hips: None, normal, Overall Severity Severity of abnormal movements (highest score from questions above): None, normal Incapacitation due to abnormal movements: None, normal Patient's awareness of abnormal movements (rate only patient's report): No Awareness, Dental Status Current problems with teeth and/or dentures?: No Does patient usually wear dentures?: No  CIWA:  CIWA-Ar Total: 10 COWS:  COWS Total Score: 4  Treatment Plan Summary: Daily contact with patient to assess and evaluate symptoms and progress in treatment Medication management  Plan:   1. Continue crisis management and stabilization.  2. Medication management:  - Continue Klonopin 1 mg every six hours as needed for anxiety -Continue Geodon 120 mg hs for improved mood stability -Continue Trazodone 50 mg hs with repeat dose for insomnia.  -Will order single one time dose of B12 injection IM for fatigue.  3. Encouraged patient to  attend groups and participate in group counseling sessions and activities.  4. Discharge plan in progress.  5. Continue current treatment plan.  6. Address health issues: Continue ultram/toradol prn as needed for right knee pain.   Medical Decision Making Problem Points:  Established problem, stable/improving (1) and Review of psycho-social stressors (1) Data Points:  Review of medication regiment & side effects (2)  I certify that inpatient services furnished can reasonably be expected to improve the patient's condition.   Malachy Chamber S FNP-BC 11/09/2013, 10:06 AM I agreed with findings and treatment plan of this patient

## 2013-11-09 NOTE — Progress Notes (Signed)
Patient did attend the evening speaker AA meeting.  

## 2013-11-10 DIAGNOSIS — F1920 Other psychoactive substance dependence, uncomplicated: Secondary | ICD-10-CM

## 2013-11-10 MED ORDER — TRAMADOL HCL 50 MG PO TABS: 100.0000 mg | ORAL_TABLET | Freq: Four times a day (QID) | ORAL | Status: DC | PRN

## 2013-11-10 MED ORDER — TRAZODONE HCL 50 MG PO TABS: 50.0000 mg | ORAL_TABLET | Freq: Every evening | ORAL | Status: DC | PRN

## 2013-11-10 MED ORDER — CLONAZEPAM 1 MG PO TABS: 1.0000 mg | ORAL_TABLET | Freq: Four times a day (QID) | ORAL | Status: DC | PRN

## 2013-11-10 MED ORDER — ZOLPIDEM TARTRATE 10 MG PO TABS: 10.0000 mg | ORAL_TABLET | Freq: Every evening | ORAL | Status: AC | PRN

## 2013-11-10 MED ORDER — ZIPRASIDONE HCL 60 MG PO CAPS
120.0000 mg | ORAL_CAPSULE | Freq: Every day | ORAL | Status: DC
Start: 2013-11-10 — End: 2014-03-21

## 2013-11-10 NOTE — Clinical Social Work Note (Signed)
CSW faxed referral for [facility] program per patient request. Voicemail left for [facility] admission coordinator Hurshel Party [phone], awaiting return call. Patient to discharge home with father to await bed availability if eligible for services.  Samuella Bruin, MSW, [employer] Clinical Social Worker [facility] [phone]

## 2013-11-10 NOTE — Progress Notes (Signed)
Patient ID: Brandon [name], male   DOB: 23-Jan-1966, 48 y.o.   MRN: [id number] Patient is discharged ambulatory to ride home with his father with whom he will be living until he goes to apply to [facility].  He denies SI/HI. He verbalizes understanding of his discharge meds and followup.  He was given scripts and a supply of trazadone and geodon.  He says he is looking forward to getting home and seeing a girlfriend who lives near him.

## 2013-11-10 NOTE — [facility] Group Notes (Signed)
[facility] LCSW Group Therapy 11/10/2013  1:15 pm  Type of Therapy: Group Therapy Participation Level: Active  Participation Quality: Attentive, Sharing and Supportive  Affect: Depressed and Flat  Cognitive: Alert and Oriented  Insight: Developing/Improving and Engaged  Engagement in Therapy: Developing/Improving and Engaged  Modes of Intervention: Clarification, Confrontation, Discussion, Education, Exploration,  Limit-setting, Orientation, Problem-solving, Rapport Building, Dance movement psychotherapist, Socialization and Support  Summary of Progress/Problems: Pt identified obstacles faced currently and processed barriers involved in overcoming these obstacles. Pt identified steps necessary for overcoming these obstacles and explored motivation (internal and external) for facing these difficulties head on. Pt further identified one area of concern in their lives and chose a goal to focus on for today. Patient reported that his primary obstacle is relapse and identified his mother as a trigger. CSW's and other group members provided patient with emotional support and encouragement.  Samuella Bruin, MSW, [employer] Clinical Social Worker [facility] [phone]

## 2013-11-10 NOTE — Plan of Care (Signed)
Problem: Alteration in mood Goal: STG-Pt Able to Identify Plan For Continuing Care at D/C Pt. Will be able to identify a plan for continuing care at discharge  Outcome: Not Progressing Pt continues to present reasons he can not go to any suggested treatment except [facility] where he has been declined.  Pt wants assistance getting accepted there.

## 2013-11-10 NOTE — [facility] Suicide Risk Assessment (Signed)
Suicide Risk Assessment  Discharge Assessment     Demographic Factors:  Male and Caucasian  Total Time spent with patient: 45 minutes  Psychiatric Specialty Exam:     Blood pressure 101/69, pulse 76, temperature 97.4 F (36.3 C), temperature source Oral, resp. rate 18, height  (1.905 m), weight 105.235 kg (232 lb).Body mass index is 29 kg/(m^2).  General Appearance: Fairly Groomed  Patent attorney::  Fair  Speech:  Clear and Coherent  Volume:  Decreased  Mood:  Anxious and worried  Affect:  anxious  Thought Process:  Coherent and Goal Directed  Orientation:  Full (Time, Place, and Person)  Thought Content:  plans as he moves on, relapse prevention plan  Suicidal Thoughts:  No  Homicidal Thoughts:  No  Memory:  Immediate;   Fair Recent;   Fair Remote;   Fair  Judgement:  Fair  Insight:  Present  Psychomotor Activity:  Restlessness  Concentration:  Fair  Recall:  [employer] of Knowledge:NA  Language: Fair  Akathisia:  No  Handed:    AIMS (if indicated):     Assets:  Desire for Improvement Housing Social Support  Sleep:  Number of Hours: 5    Musculoskeletal: Strength & Muscle Tone: within normal limits Gait & Station: affected by knee pain Patient leans: N/A   Mental Status Per Nursing Assessment::   On Admission:  NA  Current Mental Status by Physician: in full contact with reality. there are no active S/S of withdrawal. There are no active Si plans or intent. Will be staying with his father. states he will have to find another job. he will pursue outpatient follow up including calling for an appointment wiht orthopedic surgeon   Loss Factors: Decline in physical health  Historical Factors: Impulsivity  Risk Reduction Factors:   Sense of responsibility to family, Living with another person, especially a relative and Positive social support  Continued Clinical Symptoms:  Bipolar Disorder:   Bipolar II Alcohol/Substance Abuse/Dependencies  Cognitive  Features That Contribute To Risk:  Polarized thinking Thought constriction (tunnel vision)    Suicide Risk:  Minimal: No identifiable suicidal ideation.  Patients presenting with no risk factors but with morbid ruminations; may be classified as minimal risk based on the severity of the depressive symptoms  Discharge Diagnoses:   AXIS I:  Amphetamine Abuse,Bipolar Disorder AXIS II:  No diagnosis AXIS III:   Past Medical History  Diagnosis Date  . Drug abuse   . Kidney failure   . Bipolar 1 disorder   . Rhabdomyolysis   . Acute renal failure   . Opiate abuse, continuous   . Dental caries     extensive  . Anxiety   . Depression   . Bipolar affective disorder 03/17/2012  . Mood disorder 03/16/2012   AXIS IV:  other psychosocial or environmental problems AXIS V:  61-70 mild symptoms  Plan Of Care/Follow-up recommendations:  Activity:  as tolerated Diet:  regular Follow up outpatient basis Is patient on multiple antipsychotic therapies at discharge:  No   Has Patient had three or more failed trials of antipsychotic monotherapy by history:  No  Recommended Plan for Multiple Antipsychotic Therapies: NA    Jackob Crookston A 11/10/2013, 1:36 PM
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
## 2013-11-10 NOTE — [facility] Group Notes (Signed)
[facility] LCSW Aftercare Discharge Planning Group Note  11/10/2013  8:45 AM  Participation Quality: Did Not Attend.  Yvon Mccord, MSW, LCSWA Clinical Social Worker [facility] [phone]   

## 2013-11-10 NOTE — Discharge Summary (Signed)
Physician Discharge Summary Note  Patient:  Brandon [name] is an 48 y.o., male MRN:  [id number] DOB:  1965-04-22 Patient phone:  [phone] (home)  Patient address:   [address] [place] [address],  Total Time spent with patient: Greater than 30 minutes  Date of Admission:  11/04/2013  Date of Discharge: 11/10/13  Reason for Admission:  Drug detoxification treatments  Discharge Diagnoses: Active Problems:   Bipolar disorder   Amphetamine abuse   Psychiatric Specialty Exam: Physical Exam  Psychiatric: His speech is normal and behavior is normal. Judgment and thought content normal. His mood appears not anxious. His affect is not angry, not blunt, not labile and not inappropriate. Cognition and memory are normal. He does not exhibit a depressed mood.    Review of Systems  Constitutional: Negative.   HENT: Negative.   Eyes: Negative.   Respiratory: Negative.   Cardiovascular: Negative.   Gastrointestinal: Negative.   Genitourinary: Negative.   Musculoskeletal: Negative.   Skin: Negative.   Neurological: Negative.   Endo/Heme/Allergies: Negative.   Psychiatric/Behavioral: Positive for depression (Stable) and substance abuse (Polysubstance dependence). Negative for suicidal ideas, hallucinations and memory loss. The patient has insomnia (Stabilized with medication prior to discharge). The patient is not nervous/anxious.     Blood pressure 101/69, pulse 76, temperature 97.4 F (36.3 C), temperature source Oral, resp. rate 18, height  (1.905 m), weight 105.235 kg (232 lb).Body mass index is 29 kg/(m^2).   General Appearance: Fairly Groomed   Patent attorney:: Fair   Speech: Clear and Coherent   Volume: Decreased   Mood: Anxious and worried   Affect: anxious   Thought Process: Coherent and Goal Directed   Orientation: Full (Time, Place, and Person)   Thought Content: plans as he moves on, relapse prevention plan   Suicidal Thoughts: No   Homicidal  Thoughts: No   Memory: Immediate; Fair  Recent; Fair  Remote; Fair   Judgement: Fair   Insight: Present   Psychomotor Activity: Restlessness   Concentration: Fair   Recall: [employer] of Knowledge:NA   Language: Fair   Akathisia: No   Handed:   AIMS (if indicated):   Assets: Desire for Improvement  Housing  Social Support   Sleep: Number of Hours: 5    Past Psychiatric History: Diagnosis: Polysubstance dependence  Hospitalizations: [facility] adult unit  Outpatient Care: [facility] in [place], [place]  Substance Abuse Care: [facility] in [place], [place]  Self-Mutilation: NA  Suicidal Attempts: NA  Violent Behaviors: A   Musculoskeletal: Strength & Muscle Tone: within normal limits Gait & Station: normal Patient leans: N/A  DSM5: Schizophrenia Disorders:  NA Obsessive-Compulsive Disorders:  NA Trauma-Stressor Disorders:  NA Substance/Addictive Disorders:  Polysubstance dependence Depressive Disorders:  NA  Axis Diagnosis:  AXIS I:  Polysubstance dependence AXIS II:  Deferred AXIS III:   Past Medical History  Diagnosis Date  . Drug abuse   . Kidney failure   . Bipolar 1 disorder   . Rhabdomyolysis   . Acute renal failure   . Opiate abuse, continuous   . Dental caries     extensive  . Anxiety   . Depression   . Bipolar affective disorder 03/17/2012  . Mood disorder 03/16/2012   AXIS IV:  other psychosocial or environmental problems and drug dependence, chronic AXIS V:  62  Level of Care:  OP  Hospital Course:  48 Y/O male who states that after he left her last time, he went to  rehab for 9 months. He came out 3-4 weeks ago. States on the third day at his parents home "pills found him." States he got to help neighbors. States he was offered Adderall and pain pills. States the male offered them to him, and had an affair with her behind the guy's back. States that his boss asked him if he could get him some Adderall. States he got him 60 from the neighbor. He states he kept them and  snorted one and ended up snorting all of them. He went back to the house high and was asked to leave not to come back. States they though he was trying to kill himself.   While a patient in this hospital, Brandon [name] received medication management for mood stabilization. He was ordered, received and discharged on Klonopin 1 mg Q 6 hours as needed daily for anxiety, Trazodone 50 mg Q bedtime for sleep, Ambien 10 mg q bedtime for severe insomnia and Geodon 120 mg Q bedtime for mood control. He also was enrolled and participated in the Group counseling sessions being offered and held on this unit. Brandon [name] learned coping skills that should help him cope better and maintain mood stability after discharge. He received other medication regimen for the other medical issues that he presented.  Brandon [name] was motivated for recovery. He worked closely with the treatment team and case manager to develop a discharge plan with appropriate goals. Coping skills, problem solving as well as relaxation therapies were also part of his unit programming. On the day of discharge he was in much improved condition than upon admission.  His symptoms were reported as significantly decreased or resolved completely. Upon discharge, he denies any SI/HI, AVH, delusional thoughts and or paranoia. He was motivated to continue taking medication with a goal of continued improvement in mental health.   Brandon [name] will follow up care on an outpatient basis at the [facility] in [place], [place]. He is provided with all the pertinent information required to make this appointment without problems. He received from the [facility] pharmacy, a 14 days worth, supply samples of his discharge medications. Transportation per father.   Consults:  psychiatry  Significant Diagnostic Studies:  labs: CBC with diff, CMP, UDS, toxicology tests, U/A  Discharge Vitals:   Blood pressure 101/69, pulse 76, temperature 97.4 F (36.3 C), temperature source Oral, resp. rate 18, height   (1.905 m), weight 105.235 kg (232 lb). Body mass index is 29 kg/(m^2). Lab Results:   No results found for this or any previous visit (from the past 72 hour(s)).  Physical Findings: AIMS: Facial and Oral Movements Muscles of Facial Expression: None, normal Lips and Perioral Area: None, normal Jaw: None, normal Tongue: None, normal,Extremity Movements Upper (arms, wrists, hands, fingers): None, normal Lower (legs, knees, ankles, toes): None, normal, Trunk Movements Neck, shoulders, hips: None, normal, Overall Severity Severity of abnormal movements (highest score from questions above): None, normal Incapacitation due to abnormal movements: None, normal Patient's awareness of abnormal movements (rate only patient's report): No Awareness, Dental Status Current problems with teeth and/or dentures?: No Does patient usually wear dentures?: No  CIWA:  CIWA-Ar Total: 10 COWS:  COWS Total Score: 0  Psychiatric Specialty Exam: See Psychiatric Specialty Exam and Suicide Risk Assessment completed by Attending Physician prior to discharge.  Discharge destination:  Home  Is patient on multiple antipsychotic therapies at discharge:  No   Has Patient had three or more failed trials of antipsychotic monotherapy by history:  No  Recommended  Plan for Multiple Antipsychotic Therapies: NA    Medication List    STOP taking these medications       lurasidone 40 MG Tabs tablet  Commonly known as:  LATUDA      TAKE these medications     Indication   clonazePAM 1 MG tablet  Commonly known as:  KLONOPIN  Take 1 tablet (1 mg total) by mouth every 6 (six) hours as needed (anxiety).   Indication:  Severe anxiety     traMADol 50 MG tablet  Commonly known as:  ULTRAM  Take 2 tablets (100 mg total) by mouth every 6 (six) hours as needed for severe pain.   Indication:  Moderate to Moderately Severe Pain     traZODone 50 MG tablet  Commonly known as:  DESYREL  Take 1 tablet (50 mg total) by  mouth at bedtime and may repeat dose one time if needed. For sleep   Indication:  Trouble Sleeping     ziprasidone 60 MG capsule  Commonly known as:  GEODON  Take 2 capsules (120 mg total) by mouth at bedtime. For mood control   Indication:  Mood control     zolpidem 10 MG tablet  Commonly known as:  AMBIEN  Take 1 tablet (10 mg total) by mouth at bedtime as needed for sleep.   Indication:  Trouble Sleeping       Follow-up Information   Follow up with [facility]. (Walk in between 8:30AM-3PM for hospital follow-up/medication management/assessment for therapy services. )    Contact information:   [address] [place], [place] [address] Phone: [phone] Fax: [phone]      Follow up with [facility]. (Please follow up with Hurshel Party regarding referral made on 9/14 for [facility] treatment program.)    Contact information:   [phone]     Follow-up recommendations: Activity:  As tolerated Diet: As recommended by your primary care doctor. Keep all scheduled follow-up appointments as recommended.   Comments: Take all your medications as prescribed by your mental healthcare provider. Report any adverse effects and or reactions from your medicines to your outpatient provider promptly. Patient is instructed and cautioned to not engage in alcohol and or illegal drug use while on prescription medicines. In the event of worsening symptoms, patient is instructed to call the crisis hotline, 911 and or go to the nearest ED for appropriate evaluation and treatment of symptoms. Follow-up with your primary care provider for your other medical issues, concerns and or health care needs.   Total Discharge Time:  Greater than 30 minutes.  Signed: Sanjuana Kava, PMHNP-BC 11/10/2013, 3:56 PM I personally assessed the patient and formulated the plan Madie Reno A. Dub Mikes, M.D.

## 2013-11-10 NOTE — Progress Notes (Signed)
[facility] Adult Case Management Discharge Plan :  Will you be returning to the same living situation after discharge: No.  Patient will be discharging home with his parents. At discharge, do you have transportation home?:Yes,  patient reports that his father will be able to pick him up. Do you have the ability to pay for your medications:Yes,  patient will be provided with necessary medication samples and prescriptions at discharge.  Release of information consent forms completed and in the chart;  Patient's signature needed at discharge.  Patient to Follow up at: Follow-up Information   Follow up with [facility]. (Walk in between 8:30AM-3PM for hospital follow-up/medication management/assessment for therapy services. )    Contact information:   [address] [place], [place] [address] Phone: [phone] Fax: [phone]      Follow up with [facility]. (Please follow up with Hurshel Party regarding referral made on 9/14 for [facility] treatment program.)    Contact information:   [phone]      Patient denies SI/HI:   Yes,  denies    Safety Planning and Suicide Prevention discussed:  Yes,  with patient  Eliya Bubar, [place] 11/10/2013, 11:35 AM

## 2013-11-12 NOTE — Progress Notes (Signed)
Patient Discharge Instructions:  After Visit Summary (AVS):   Faxed to:  11/12/13 Discharge Summary Note:   Faxed to:  11/12/13 Psychiatric Admission Assessment Note:   Faxed to:  11/12/13 Suicide Risk Assessment - Discharge Assessment:   Faxed to:  11/12/13 Faxed/Sent to the Next Level Care provider:  11/12/13 Faxed to [facility] @ [phone]  [name], 11/12/2013, 3:50 PM

## 2014-01-19 ENCOUNTER — Emergency Department (HOSPITAL_COMMUNITY)
Admission: EM | Admit: 2014-01-19 | Discharge: 2014-01-19 | Disposition: A | Discharge status: Home / Self Care | Payer: Self-pay | Attending: Emergency Medicine | Admitting: Emergency Medicine

## 2014-01-19 ENCOUNTER — Encounter (HOSPITAL_COMMUNITY): Payer: Self-pay | Admitting: Emergency Medicine

## 2014-01-19 DIAGNOSIS — Z8739 Personal history of other diseases of the musculoskeletal system and connective tissue: Secondary | ICD-10-CM | POA: Insufficient documentation

## 2014-01-19 DIAGNOSIS — Z8719 Personal history of other diseases of the digestive system: Secondary | ICD-10-CM | POA: Insufficient documentation

## 2014-01-19 DIAGNOSIS — F1120 Opioid dependence, uncomplicated: Secondary | ICD-10-CM | POA: Insufficient documentation

## 2014-01-19 DIAGNOSIS — Z79899 Other long term (current) drug therapy: Secondary | ICD-10-CM | POA: Insufficient documentation

## 2014-01-19 DIAGNOSIS — F419 Anxiety disorder, unspecified: Secondary | ICD-10-CM | POA: Insufficient documentation

## 2014-01-19 DIAGNOSIS — F319 Bipolar disorder, unspecified: Secondary | ICD-10-CM | POA: Insufficient documentation

## 2014-01-19 DIAGNOSIS — Z87448 Personal history of other diseases of urinary system: Secondary | ICD-10-CM | POA: Insufficient documentation

## 2014-01-19 LAB — CBC
HCT: 38.5 % — ABNORMAL LOW (ref 39.0–52.0)
HEMOGLOBIN: 13.2 g/dL (ref 13.0–17.0)
MCH: 30.6 pg (ref 26.0–34.0)
MCHC: 34.3 g/dL (ref 30.0–36.0)
MCV: 89.1 fL (ref 78.0–100.0)
PLATELETS: 386 10*3/uL (ref 150–400)
RBC: 4.32 MIL/uL (ref 4.22–5.81)
RDW: 12.9 % (ref 11.5–15.5)
WBC: 7.6 10*3/uL (ref 4.0–10.5)

## 2014-01-19 LAB — COMPREHENSIVE METABOLIC PANEL
ALT: 19 U/L (ref 0–53)
ANION GAP: 14 (ref 5–15)
AST: 18 U/L (ref 0–37)
Albumin: 4.1 g/dL (ref 3.5–5.2)
Alkaline Phosphatase: 72 U/L (ref 39–117)
BUN: 13 mg/dL (ref 6–23)
CALCIUM: 9.5 mg/dL (ref 8.4–10.5)
CO2: 22 meq/L (ref 19–32)
CREATININE: 1.21 mg/dL (ref 0.50–1.35)
Chloride: 101 mEq/L (ref 96–112)
GFR, EST AFRICAN AMERICAN: 80 mL/min — AB (ref >90)
GFR, EST NON AFRICAN AMERICAN: 69 mL/min — AB (ref >90)
GLUCOSE: 134 mg/dL — AB (ref 70–99)
Potassium: 4.1 mEq/L (ref 3.7–5.3)
Sodium: 137 mEq/L (ref 137–147)
TOTAL PROTEIN: 7.6 g/dL (ref 6.0–8.3)
Total Bilirubin: 0.2 mg/dL — ABNORMAL LOW (ref 0.3–1.2)

## 2014-01-19 LAB — ETHANOL

## 2014-01-19 LAB — ACETAMINOPHEN LEVEL: Acetaminophen (Tylenol), Serum: <15 ug/mL (ref 10–30)

## 2014-01-19 LAB — SALICYLATE LEVEL: Salicylate Lvl: <2 mg/dL — ABNORMAL LOW (ref 2.8–20.0)

## 2014-01-19 NOTE — ED Notes (Signed)
Pt left w/o discharge papers

## 2014-01-19 NOTE — ED Provider Notes (Signed)
CSN: [id number]     Arrival date & time 01/19/14  1410 History   First MD Initiated Contact with Patient 01/19/14 1545     Chief Complaint  Patient presents with  . Medical Clearance    opiate detox     (Consider location/radiation/quality/duration/timing/severity/associated sxs/prior Treatment) HPI Comments: Patient here requesting detox from heroin. No suicidal homicidal ideations. Last used heroin today. Injects in his right antecubital fossa. No fever or chills. No command hallucinations. Has been in inpatient rehabilitation for the past. Denies any daily alcohol use. Symptoms persistent and nothing makes them better or worse. No treatment used prior to arrival.  The history is provided by the patient.    Past Medical History  Diagnosis Date  . Drug abuse   . Kidney failure   . Bipolar 1 disorder   . Rhabdomyolysis   . Acute renal failure   . Opiate abuse, continuous   . Dental caries     extensive  . Anxiety   . Depression   . Bipolar affective disorder 03/17/2012  . Mood disorder 03/16/2012   Past Surgical History  Procedure Laterality Date  . Knee surgery      x 5   Family History  Problem Relation Age of Onset  . Heart disease Mother   . Heart disease Father    History  Substance Use Topics  . Smoking status: Never Smoker   . Smokeless tobacco: Never Used  . Alcohol Use: No    Review of Systems  All other systems reviewed and are negative.     Allergies  Other  Home Medications   Prior to Admission medications   Medication Sig Start Date End Date Taking? Authorizing Provider  diphenhydramine-acetaminophen (TYLENOL PM) 25-500 MG TABS Take 3 tablets by mouth at bedtime as needed.   Yes Historical Provider, MD  lurasidone (LATUDA) 80 MG TABS tablet Take 80 mg by mouth at bedtime.   Yes Historical Provider, MD  traMADol (ULTRAM) 50 MG tablet Take 2 tablets (100 mg total) by mouth every 6 (six) hours as needed for severe pain. 11/10/13  Yes Sanjuana KavaAgnes I  Nwoko, NP  zolpidem (AMBIEN) 10 MG tablet Take 10 mg by mouth at bedtime as needed for sleep. Repeat in 1 hour if needed   Yes Historical Provider, MD  clonazePAM (KLONOPIN) 1 MG tablet Take 1 tablet (1 mg total) by mouth every 6 (six) hours as needed (anxiety). Patient not taking: Reported on 01/19/2014 11/10/13   Sanjuana KavaAgnes I Nwoko, NP  traZODone (DESYREL) 50 MG tablet Take 1 tablet (50 mg total) by mouth at bedtime and may repeat dose one time if needed. For sleep 11/10/13   Sanjuana KavaAgnes I Nwoko, NP  ziprasidone (GEODON) 60 MG capsule Take 2 capsules (120 mg total) by mouth at bedtime. For mood control 11/10/13   Sanjuana KavaAgnes I Nwoko, NP   BP 133/73 mmHg  Pulse 73  Temp(Src) 98.4 F (36.9 C) (Oral)  Resp 20  SpO2 95% Physical Exam  Constitutional: He is oriented to person, place, and time. He appears well-developed and well-nourished.  Non-toxic appearance. No distress.  HENT:  Head: Normocephalic and atraumatic.  Eyes: Conjunctivae, EOM and lids are normal. Pupils are equal, round, and reactive to light.  Neck: Normal range of motion. Neck supple. No tracheal deviation present. No thyroid mass present.  Cardiovascular: Normal rate, regular rhythm and normal heart sounds.  Exam reveals no gallop.   No murmur heard. Pulmonary/Chest: Effort normal and breath sounds normal. No stridor. No  respiratory distress. He has no decreased breath sounds. He has no wheezes. He has no rhonchi. He has no rales.  Abdominal: Soft. Normal appearance and bowel sounds are normal. He exhibits no distension. There is no tenderness. There is no rebound and no CVA tenderness.  Musculoskeletal: Normal range of motion. He exhibits no edema or tenderness.  Neurological: He is alert and oriented to person, place, and time. He has normal strength. No cranial nerve deficit or sensory deficit. GCS eye subscore is 4. GCS verbal subscore is 5. GCS motor subscore is 6.  Skin: Skin is warm and dry. No abrasion and no rash noted.  Psychiatric:  His speech is normal and behavior is normal. His mood appears anxious. He expresses no suicidal plans and no homicidal plans.  Nursing note and vitals reviewed.   ED Course  Procedures (including critical care time) Labs Review Labs Reviewed  CBC - Abnormal; Notable for the following:    HCT 38.5 (*)    All other components within normal limits  COMPREHENSIVE METABOLIC PANEL - Abnormal; Notable for the following:    Glucose, Bld 134 (*)    Total Bilirubin 0.2 (*)    GFR calc non Af Amer 69 (*)    GFR calc Af Amer 80 (*)    All other components within normal limits  SALICYLATE LEVEL - Abnormal; Notable for the following:    Salicylate Lvl <2.0 (*)    All other components within normal limits  ACETAMINOPHEN LEVEL  ETHANOL  URINE RAPID DRUG SCREEN (HOSP PERFORMED)    Imaging Review No results found.   EKG Interpretation None      MDM   Final diagnoses:  None    Patient without evidence of withdrawal this time. He has no acute psychiatric emergency. He will be given outpatient resources.    Toy BakerAnthony T Keslie Gritz, MD 01/19/14 [phone]

## 2014-01-19 NOTE — Discharge Instructions (Signed)
Opioid Use Disorder °Opioid use disorder is a mental disorder. It is the continued nonmedical use of opioids in spite of risks to health and well-being. Misused opioids include the street drug heroin. They also include pain medicines such as morphine, hydrocodone, oxycodone, and fentanyl. Opioids are very addictive. People who misuse opioids get an exaggerated feeling of well-being. Opioid use disorder often disrupts activities at home, work, or school. It may cause mental or physical problems.  °A family history of opioid use disorder puts you at higher risk of it. People with opioid use disorder often misuse other drugs or have mental illness such as depression, posttraumatic stress disorder, or antisocial personality disorder. They also are at risk of suicide and death from overdose. °SIGNS AND SYMPTOMS  °Signs and symptoms of opioid use disorder include: °· Use of opioids in larger amounts or over a longer period than intended. °· Unsuccessful attempts to cut down or control opioid use. °· A lot of time spent obtaining, using, or recovering from the effects of opioids. °· A strong desire or urge to use opioids (craving). °· Continued use of opioids in spite of major problems at work, school, or home because of use. °· Continued use of opioids in spite of relationship problems because of use. °· Giving up or cutting down on important life activities because of opioid use. °· Use of opioids over and over in situations when it is physically hazardous, such as driving a car. °· Continued use of opioids in spite of a physical problem that is likely related to use. Physical problems can include: °¨ Severe constipation. °¨ Poor nutrition. °¨ Infertility. °¨ Tuberculosis. °¨ Aspiration pneumonia. °¨ Infections such as human immunodeficiency virus (HIV) and hepatitis (from injecting opioids). °· Continued use of opioids in spite of a mental problem that is likely related to use. Mental problems can  include: °¨ Depression. °¨ Anxiety. °¨ Hallucinations. °¨ Sleep problems. °¨ Loss of sexual function. °· Need to use more and more opioids to get the same effect, or lessened effect over time with use of the same amount (tolerance). °· Having withdrawal symptoms when opioid use is stopped, or using opioids to reduce or avoid withdrawal symptoms. Withdrawal symptoms include: °¨ Depressed, anxious, or irritable mood. °¨ Nausea, vomiting, diarrhea, or intestinal cramping. °¨ Muscle aches or spasms. °¨ Excessive tearing or runny nose. °¨ Dilated pupils, sweating, or hairs standing on end. °¨ Yawning. °¨ Fever, raised blood pressure, or fast pulse. °¨ Restlessness or trouble sleeping. This does not apply to people taking opioids for medical reasons only. °DIAGNOSIS °Opioid use disorder is diagnosed by your health care provider. You may be asked questions about your opioid use and and how it affects your life. A physical exam may be done. A drug screen may be ordered. You may be referred to a mental health professional. The diagnosis of opioid use disorder requires at least two symptoms within 12 months. The type of opioid use disorder you have depends on the number of signs and symptoms you have. The type may be: °· Mild. Two or three signs and symptoms.    °· Moderate. Four or five signs and symptoms.   °· Severe. Six or more signs and symptoms. °TREATMENT  °Treatment is usually provided by mental health professionals with training in substance use disorders. The following options are available: °· Detoxification. This is the first step in treatment for withdrawal. It is medically supervised withdrawal with the use of medicines. These medicines lessen withdrawal symptoms. They also raise the chance   of becoming opioid free. °· Counseling, also known as talk therapy. Talk therapy addresses the reasons you use opioids. It also addresses ways to keep you from using again (relapse). The goals of talk therapy are to avoid  relapse by: °¨ Identifying and avoiding triggers for use. °¨ Finding healthy ways to cope with stress. °¨ Learning how to handle cravings. °· Support groups. Support groups provide emotional support, advice, and guidance. °· A medicine that blocks opioid receptors in your brain. This medicine can reduce opioid cravings that lead to relapse. This medicine also blocks the desired opioid effect when relapse occurs. °· Opioids that are taken by mouth in place of the misused opioid (opioid maintenance treatment). These medicines satisfy cravings but are safer than commonly misused opioids. This often is the best option for people who continue to relapse with other treatments. °HOME CARE INSTRUCTIONS  °· Take medicines only as directed by your health care provider. °· Check with your health care provider before starting new medicines. °· Keep all follow-up visits as directed by your health care provider. °SEEK MEDICAL CARE IF: °· You are not able to take your medicines as directed. °· Your symptoms get worse. °SEEK IMMEDIATE MEDICAL CARE IF: °· You have serious thoughts about hurting yourself or others. °· You may have taken an overdose of opioids. °FOR MORE INFORMATION °· National Institute on Drug Abuse: www.drugabuse.gov °· Substance Abuse and Mental Health Services Administration: www.samhsa.gov °Document Released: 12/11/2006 Document Revised: 06/30/2013 Document Reviewed: 02/26/2013 °ExitCare® Patient Information ©2015 [employer]. This information is not intended to replace advice given to you by your health care provider. Make sure you discuss any questions you have with your health care provider. °Substance Abuse Treatment Programs ° °Intensive Outpatient Programs °[facility]     °[address]      °[place], [place]                   °[phone]      ° °[facility] °[address] °[place], [place] °[phone] ° °[facility]     °(Inpatient  and outpatient)     °[address]           °[phone]   ° °[facility] °[phone] (Suboxone and Methadone) ° °[address]      °[place], [place] [address]      °[phone]      ° °[address] °[place], [place] °[phone] ° °[facility] (Outpatient/Inpatient, [facility])    °(insurance only) [phone]      °       °[facility] (Groups & Residential) °[place], [place] °[phone] ° °   °[facility]     °[address]     °[place], [place]      °[phone]      ° °[facility] (for caregivers and family) °[address] °[place], [place] °[phone] ° ° ° ° ° °Residential Treatment Programs °[facility]      °[address], [place], [place] [address]  °[phone]      ° °[facility] °[address] [place], [place] [address] °[phone] ° °[facility]        °[phone]      ° °[facility] °[phone] ° °[facility]             °[address]                                         °  [place], [place]                                                [phone] or [phone]                               [facility] [place] [address] [place], [address] [phone]  [facility]    [address]      [place], [place]     [phone]       [facility] [address] [place], [place] [phone]  [facility]   [address] [place], [place] [address]     [phone]      Admissions: 8am-3pm M-F  [facility] ([facility] [address] [place], [place] [phone]  [facility]: Residential Program [phone] Days)   [place], [place]      [phone] or [phone]     [facility]: [facility] [place], [place] (Walk in Hours over the weekend or by referral)  [facility] [address] [employer], [place], [place] [address] [phone]  Crisis Mobile:  [facility]:  [phone] (for crisis response 24 hours a day) [facility] Hotline:      ([phone] Outpatient Psychiatry and Counseling  [facility]: Mobile Crisis Management 24 hours:  [phone]  [facility] of the [employer] sliding scale fee and walk in schedule: M-F 8am-12pm/1pm-3pm [address]  [place], [place] [address] [phone]  [facility] [address] [place], [place] [address] [phone]  [facility] ([facility] new patient walk-in appointments available Monday - Friday 8am -3pm.          [address] [place], [place] [address] [phone] or crisis line- [phone]  [facility]/ Intensive Outpatient Therapy Program [address] [place], [place] [address] [phone]  [facility]                  Crisis Services      [phone]     [place], [place] [address]                 [facility]   [facility] [phone]. [address] [place], [place] [address]   [facility] of Care          [address] [place]  [place], [place] [address]       [phone]  [facility] [address] [place], [place] [address] [phone]  [facility]    [address]    [place], [place] [address]     [phone]       Andee Poles, MD     [address]     [place] [address]     [phone]       [facility] [address] [place] [address]  [facility]     [address]     [place], [place]      [phone]       [facility] Eulogio Ditch, [place] [phone]  [place], [place] [address] [place]     [address]     [place], [place]  [address]     [phone]       [facility] [address] [employer], [place] [address] [phone] Resources for Temporary Residential Assistance/Crisis [facility]) M-F 8am-3pm   [address] [place], [place] [address]   [phone] Services include: laundry, barbering, support groups, case management, phone  & computer access, showers, AA/NA mtgs, mental health/substance abuse nurse, job skills class, disability information, VA assistance, spiritual classes, etc.   [facility]   [address], [place]     [place]              [employer] (women and children)       [place]. [place], [place] [address] [phone] [email or web address] for application and process Application Required  [facility]   [address]    [place] [address]     [phone]                    [place], [place] [address] [phone]([email or web address] application appt.) Application Required  [facility] (women only)    [address]     [place], [place] [address]     [phone]      Intake starts 6pm daily Need valid ID, SSC, & Police report [facility] [address] [place], [place] [phone] Application Required  [employer] (men only)     [place].      [place], [place]       [place] (Pregnant women only) [address] [place], [place]  [facility]      [place] [address]      [place], [place] [address]     [phone]             [facility] [address] [place], [place] 90 day commitment/SA/Application process  [facility](men only)     [address]     [place],  [place]       Check-in at [facility] [address] [place],  [address] [phone] Men/Women/Women and Children must be there by 7 pm  [place], [place]

## 2014-01-19 NOTE — ED Notes (Signed)
Patient is aware that we need a urine specimen- cup at bedside

## 2014-01-19 NOTE — ED Notes (Signed)
Pt reports using po opiate pills for many years. Started using heroin 2 months ago. Last use of heroin at 1300 today. A total of 1g today. Denies SI/HI. Uses ETOH occasionally, last use 5 days ago.

## 2014-02-14 ENCOUNTER — Encounter (HOSPITAL_COMMUNITY): Payer: Self-pay | Admitting: *Deleted

## 2014-02-14 ENCOUNTER — Emergency Department (HOSPITAL_COMMUNITY)
Admission: EM | Admit: 2014-02-14 | Discharge: 2014-02-14 | Disposition: A | Discharge status: Home / Self Care | Payer: Self-pay | Attending: Emergency Medicine | Admitting: Emergency Medicine

## 2014-02-14 DIAGNOSIS — S8991XA Unspecified injury of right lower leg, initial encounter: Secondary | ICD-10-CM | POA: Insufficient documentation

## 2014-02-14 DIAGNOSIS — X58XXXA Exposure to other specified factors, initial encounter: Secondary | ICD-10-CM | POA: Insufficient documentation

## 2014-02-14 DIAGNOSIS — Y9389 Activity, other specified: Secondary | ICD-10-CM | POA: Insufficient documentation

## 2014-02-14 DIAGNOSIS — Z87448 Personal history of other diseases of urinary system: Secondary | ICD-10-CM | POA: Insufficient documentation

## 2014-02-14 DIAGNOSIS — T1490XA Injury, unspecified, initial encounter: Secondary | ICD-10-CM

## 2014-02-14 DIAGNOSIS — Y9286 Slaughter house as the place of occurrence of the external cause: Secondary | ICD-10-CM | POA: Insufficient documentation

## 2014-02-14 DIAGNOSIS — Z8739 Personal history of other diseases of the musculoskeletal system and connective tissue: Secondary | ICD-10-CM | POA: Insufficient documentation

## 2014-02-14 DIAGNOSIS — F419 Anxiety disorder, unspecified: Secondary | ICD-10-CM | POA: Insufficient documentation

## 2014-02-14 DIAGNOSIS — M25561 Pain in right knee: Secondary | ICD-10-CM

## 2014-02-14 DIAGNOSIS — Y990 Civilian activity done for income or pay: Secondary | ICD-10-CM | POA: Insufficient documentation

## 2014-02-14 DIAGNOSIS — Z8719 Personal history of other diseases of the digestive system: Secondary | ICD-10-CM | POA: Insufficient documentation

## 2014-02-14 DIAGNOSIS — G8929 Other chronic pain: Secondary | ICD-10-CM | POA: Insufficient documentation

## 2014-02-14 DIAGNOSIS — F319 Bipolar disorder, unspecified: Secondary | ICD-10-CM | POA: Insufficient documentation

## 2014-02-14 MED ORDER — TRAMADOL HCL 50 MG PO TABS
50.0000 mg | ORAL_TABLET | Freq: Once | ORAL | Status: AC
  Administered 2014-02-14: 50 mg via ORAL
  Filled 2014-02-14: qty 1

## 2014-02-14 MED ORDER — NAPROXEN 500 MG PO TABS: 500.0000 mg | ORAL_TABLET | Freq: Two times a day (BID) | ORAL | Status: DC

## 2014-02-14 MED ORDER — TRAMADOL HCL 50 MG PO TABS: 50.0000 mg | ORAL_TABLET | Freq: Four times a day (QID) | ORAL | Status: DC | PRN

## 2014-02-14 MED ORDER — NAPROXEN 250 MG PO TABS
500.0000 mg | ORAL_TABLET | Freq: Once | ORAL | Status: AC
  Administered 2014-02-14: 500 mg via ORAL
  Filled 2014-02-14: qty 2

## 2014-02-14 NOTE — ED Notes (Signed)
Declined W/C at D/C and was escorted to lobby by RN. 

## 2014-02-14 NOTE — ED Provider Notes (Signed)
CSN: [id number]     Arrival date & time 02/14/14  0955 History  This chart was scribed for non-physician practitioner working with Nelia Shiobert L Beaton, MD by Elveria Risingimelie Horne, ED Scribe. This patient was seen in room TR08C/TR08C and the patient's care was started at 10:18 AM.   Chief Complaint  Patient presents with  . Knee Pain   The history is provided by the patient. No language interpreter was used.   HPI Comments: Brandon [name] is a 48 y.o. male with PMHx of opiate abuse, renal failure, Rhabdomyolysis, Depression, and Bipolar affective disorder who presents to the Emergency Department complaining of acute on chronic right knee pain. Patient's reports several past surgeries to his right MCL and ACL performed by Dr. Lajoyce Cornersuda, tendonitis and arthritis.  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 Patient reports slipping/sliding several times and standing on the unsteady surface for a full work day; he denies true fall. Patient reports exacerbated pain in right popliteal region with standing and exacerbated pain in medial aspect with flexion and extension. Patient reports past success with Tramadol treatment stating that it didn't initiate relapse. Patient states that he can not miss work because he is currently living at a half way and must pay his rent, otherwise he will be kicked out. Patient states that he already takes 2400mg  ibuprofen a day for his dental pain.  Past Medical History  Diagnosis Date  . Drug abuse   . Kidney failure   . Bipolar 1 disorder   . Rhabdomyolysis   . Acute renal failure   . Opiate abuse, continuous   . Dental caries     extensive  . Anxiety   . Depression   . Bipolar affective disorder 03/17/2012  . Mood disorder 03/16/2012   Past Surgical History  Procedure Laterality Date  . Knee surgery      x 5   Family History  Problem Relation Age of Onset  . Heart disease Mother   . Heart disease Father    History   Substance Use Topics  . Smoking status: Never Smoker   . Smokeless tobacco: Never Used  . Alcohol Use: No    Review of Systems  Constitutional: Negative for fever and chills.  Gastrointestinal: Negative for nausea, vomiting and diarrhea.  Musculoskeletal: Positive for joint swelling and arthralgias.  Neurological: Negative for weakness and numbness.      Allergies  Other  Home Medications   Prior to Admission medications   Medication Sig Start Date End Date Taking? Authorizing Provider  clonazePAM (KLONOPIN) 1 MG tablet Take 1 tablet (1 mg total) by mouth every 6 (six) hours as needed (anxiety). Patient not taking: Reported on 01/19/2014 11/10/13   Sanjuana KavaAgnes I Nwoko, NP  diphenhydramine-acetaminophen (TYLENOL PM) 25-500 MG TABS Take 3 tablets by mouth at bedtime as needed.    Historical Provider, MD  lurasidone (LATUDA) 80 MG TABS tablet Take 80 mg by mouth at bedtime.    Historical Provider, MD  traMADol (ULTRAM) 50 MG tablet Take 2 tablets (100 mg total) by mouth every 6 (six) hours as needed for severe pain. 11/10/13   Sanjuana KavaAgnes I Nwoko, NP  traZODone (DESYREL) 50 MG tablet Take 1 tablet (50 mg total) by mouth at bedtime and may repeat dose one time if needed. For sleep 11/10/13   Sanjuana KavaAgnes I Nwoko, NP  ziprasidone (GEODON) 60 MG capsule Take 2 capsules (120 mg total) by mouth at bedtime. For mood control  11/10/13   Sanjuana KavaAgnes I Nwoko, NP  zolpidem (AMBIEN) 10 MG tablet Take 10 mg by mouth at bedtime as needed for sleep. Repeat in 1 hour if needed    Historical Provider, MD   Triage Vitals: BP 122/93 mmHg  Pulse 64  Temp(Src) 97.4 F (36.3 C) (Oral)  Resp 18  SpO2 97% Physical Exam  Constitutional: He is oriented to person, place, and time. He appears well-developed and well-nourished. No distress.  HENT:  Head: Normocephalic and atraumatic.  Eyes: EOM are normal.  Neck: Neck supple. No tracheal deviation present.  Cardiovascular: Normal rate.   Pulmonary/Chest: Effort normal. No  respiratory distress.  Musculoskeletal: Normal range of motion.  Mild swelling to right knee compared to left. Effusion to the medial inferior patella. Tender to palpation along the same area. No patellar laxity noted. 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 No redness . 5/5 plantar and dorsiflexion and straight leg raise. No swelling or redness noted to right calf. 2+ DP in right foot.   Neurological: He is alert and oriented to person, place, and time.  Skin: Skin is warm and dry.  Psychiatric: He has a normal mood and affect. His behavior is normal.  Nursing note and vitals reviewed.   ED Course  Procedures (including critical care time)  COORDINATION OF CARE: 10:31 AM- Discussed treatment plan with patient at bedside and patient agreed to plan.   Labs Review Labs Reviewed - No data to display  Imaging Review No results found.   EKG Interpretation None      MDM   Final diagnoses:  Injury  Knee pain, acute, right   48 yo with recurrent knee pain worsening after twisting at work, no fall, no bony tenderness. Knee appears to have a mild effusion.  X-Ray ordered but delay in receiving xray and would like to be discharged. Doubt any fracture because of mechanism of injury.  Pain managed in ED. Pt advised to follow up with orthopedics for further mgmt. He was given knee sleeve while in ED, conservative therapy recommended and discussed. Pt not able to take time off of work because of arrangement in halfway house. Pt reports relief with tramadol without contributing to opioid relapse. Tramadol prescription provided. Patient will be dc home & is agreeable with above plan.   I personally performed the services described in this documentation, which was scribed in my presence. The recorded information has been reviewed and is accurate.  Filed Vitals:   02/14/14 1006 02/14/14 1151  BP: 122/93 126/72  Pulse: 64 61  Temp: 97.4 F (36.3 C) 97.6 F (36.4 C)   TempSrc: Oral Oral  Resp: 18 18  SpO2: 97% 97%   Meds given in ED:  Medications  naproxen (NAPROSYN) tablet 500 mg (500 mg Oral Given 02/14/14 1041)  traMADol (ULTRAM) tablet 50 mg (50 mg Oral Given 02/14/14 1041)    Discharge Medication List as of 02/14/2014 11:50 AM    START taking these medications   Details  naproxen (NAPROSYN) 500 MG tablet Take 1 tablet (500 mg total) by mouth 2 (two) times daily with a meal., Starting 02/14/2014, Until Discontinued, Print    !! traMADol (ULTRAM) 50 MG tablet Take 1 tablet (50 mg total) by mouth every 6 (six) hours as needed., Starting 02/14/2014, Until Discontinued, Print     !! - Potential duplicate medications found. Please discuss with provider.        Harle BattiestElizabeth Nixon Sparr, NP 02/16/14 2123  Nelia Shiobert L Beaton, MD  02/23/14 1510 

## 2014-02-14 NOTE — Discharge Instructions (Signed)
These follow directions provided. Be sure to establish care and follow-up with orthopedic doctor for further treatment and management of this knee pain. In the meantime rest the knee as much as possible, use ice 20 minutes on 20 minutes off, where your compression sleeve for comfort and elevate it when you can't. Use Naproxen twice a day for pain and inflammation. Please stop taking ibuprofen while taking the naproxen. May use the resources guide below to help find a primary care doctor for any further management. Don't hesitate to return for any new, worsening, or concerning symptoms.  SEEK IMMEDIATE MEDICAL CARE IF:  Your knee joint feels hot to the touch and you have a high fever.   Emergency Department Resource Guide 1) Find a Doctor and Pay Out of Pocket Although you won't have to find out who is covered by your insurance plan, it is a good idea to ask around and get recommendations. You will then need to call the office and see if the doctor you have chosen will accept you as a new patient and what types of options they offer for patients who are self-pay. Some doctors offer discounts or will set up payment plans for their patients who do not have insurance, but you will need to ask so you aren't surprised when you get to your appointment.  2) Contact Your Local Health Department Not all health departments have doctors that can see patients for sick visits, but many do, so it is worth a call to see if yours does. If you don't know where your local health department is, you can check in your phone book. The CDC also has a tool to help you locate your state's health department, and many state websites also have listings of all of their local health departments.  3) Find a Walk-in Clinic If your illness is not likely to be very severe or complicated, you may want to try a walk in clinic. These are popping up all over the country in pharmacies, drugstores, and shopping centers. They're usually  staffed by nurse practitioners or physician assistants that have been trained to treat common illnesses and complaints. They're usually fairly quick and inexpensive. However, if you have serious medical issues or chronic medical problems, these are probably not your best option.  No Primary Care Doctor: - Call Health Connect at  [phone] - they can help you locate a primary care doctor that  accepts your insurance, provides certain services, etc. - Physician Referral Service- [phone]  Chronic Pain Problems: Organization         Address  Phone   Notes  [facility]  [phone]) [phone] Patients need to be referred by their primary care doctor.   Medication Assistance: Organization         Address  Phone   Notes  [facility] Medication [facility] [address] [place], [place] [address] [phone]) [phone] --Must be a resident of [place] -- Must have NO insurance coverage whatsoever (no Medicaid/ Medicare, etc.) -- The pt. MUST have a primary care doctor that directs their care regularly and follows them in the community   MedAssist  [phone]   [employer]  [phone]    Agencies that provide inexpensive medical care: Organization         Address  Phone   Notes  [facility]  [phone]   [facility]    [phone]   [facility] [address] [place], [place] [address] [phone]   [facility] [address], [place] [phone]   [facility]anned Parenthood    [phone]   [facility]    [phone](336) [phone]   [facility]  [address], [place] Phone:  [phone], Fax:  [phone](336) [phone] Hours of Operation:  9 am - 6 pm, M-F.  Also accepts Medicaid/Medicare and self-pay.  [facility]  [address], [place] Phone: [phone](336) [phone], Fax: [phone]. Hours of  Operation:  8:30 am - 5:30 pm, M-F.  Also accepts Medicaid and self-pay.  [facility] [place] [address], [place] Phone: [phone]) [phone]   [facility] [address], [place], [place] [phone], Ext. [phone] Mondays & Thursdays: 7-9 AM.  First 15 patients are seen on a first come, first serve basis.    Medicaid-accepting [facility] Providers:  Organization         Address  Phone   Notes  [facility] [address], [place] [phone] Also accepts self-pay patients.  [facility] [address], [place]  [phone]) [phone]   [facility] [address], [place] [phone]) [phone]   [facility] [address] [place] [address], [place] [phone]) [phone]   Renaye RakersVeita Bland [address], [place]   [phone] Only accepts [place] Access [place] patients after they have their name applied to their card.   Self-Pay (no insurance) in [facility] [place]:  Organization         Address  Phone   Notes  Sickle Cell Patients, [facility] [address], [place] [phone]   [facility] [address], [place] [phone]) [phone]   [facility] [place]  [address], [place] [phone]) [phone]   [facility]/Dr. Osei-Bonsu  [address] [place] [address], [place] or [phone], [place] [phone]) [phone] Phone number for both [place] and [place] locations is the same.  [facility] [address], [place] [phone]) [phone]   [facility] [place] [address] [place] [address], [place] or [address] [phone]) [phone]   [facility] [address], [place] [phone]) [phone], phone; [phone], fax Sees patients 1st and 3rd Saturday of every month.  Must not qualify for public or private insurance (i.e. Medicaid, Medicare,  [place] Health Choice, Veterans' Benefits)  Household income should be no more than 200% of the poverty level The clinic cannot treat you if you are pregnant or think you are pregnant  Sexually transmitted diseases are not treated at the clinic.    Dental Care: Organization         Address  Phone  Notes  [facility] [address], [place] [phone] Accepts children up to age 48 who are enrolled in [place]icaid or [place] Health Choice; pregnant women with a Medicaid card; and children who have applied for Medicaid or [place] Health Choice, but were declined, whose parents can pay a reduced fee at time of service.  [facility] of [facility] [place]  [address], [place] [phone]) [phone] Accepts children up to age 48 who are enrolled  in Medicaid or [place] Health Choice; pregnant women with a Medicaid card; and children who have applied for Medicaid or [place] Health Choice, but were declined, whose parents can pay a reduced fee at time of service.  [facility]  [address], [place] [phone] Patients are seen by appointment only. Walk-ins are not accepted. [facility] will see patients 48 years of age and older. Monday - Tuesday (8am-5pm) Most Wednesdays (8:30-5pm) $30 per visit, cash only  [facility]  [address], [facility] [phone] Patients are seen by appointment only. Walk-ins are not accepted. [facility] will see patients 48 years of age and older. One Wednesday Evening (Monthly: Volunteer Based).  $30 per visit, cash only  [employer]  [phone] for adults; Children under age 464, call Graduate Pediatric Dentistry at [phone]. Children aged 294-14, please call [phone] to request a pediatric application.  Dental services are provided in all areas of dental care including fillings, crowns and bridges,  complete and partial dentures, implants, gum treatment, root canals, and extractions. Preventive care is also provided. Treatment is provided to both adults and children. Patients are selected via a lottery and there is often a waiting list.   [facility] [address], [place]  [phone]) [phone] [email or web address]   [facility] [address], [place], [place] [phone], Ext. [phone] Second and Fourth Thursday of each month, opens at 6:30 AM; Clinic ends at 9 AM.  Patients are seen on a first-come first-served basis, and a limited number are seen during each clinic.   [facility]  [address], [place], [place] [phone]) [phone]   Eligibility Requirements You must have lived in [place], [place], or [place] counties for at least the last three months.   You cannot be eligible for state or federal sponsored National Cityhealthcare insurance, including CIGNAVeterans Administration, [place]icaid, or [employer] EntertainmentMedicare.   You generally cannot be eligible for healthcare insurance through your employer.    How to apply: Eligibility screenings are held every Tuesday and Wednesday afternoon from 1:00 pm until 4:00 pm. You do not need an appointment for the interview!  [facility] [address], [place], [place] [phone]   [facility]  [phone]   [facility]  [phone]   [facility]  [phone]    Behavioral Health Resources in the Community: Intensive Outpatient Programs Organization         Address  Phone  Notes  [facility] [address], [place], [place] [phone]   [facility] Outpatient [address], [place], [place] [phone]   [facility] [address], [place], [place]  [phone]   [facility] [address],  [place], [place] [phone] or [phone]   Substance Abuse Resources Organization          Address  Phone  Notes  Alcohol [facility] Drug Services  [phone]   [facility]  [phone]   [facility]  [phone]   [facility]  [phone]   Residential & Outpatient Substance Abuse Program  [phone]   Psychological Services Organization         Address  Phone  Notes  [facility]  [phone]- [phone]   [facility]  [phone]- [phone]   [facility] [address], [place] [phone]  or [phone]    Mobile Crisis Teams Organization         Address  Phone  Notes  [facility], Mobile Crisis Care Unit  [phone]   [facility]  [address] [place], [place]   [facility] [address], [place] [phone]    Self-Help/Support Groups Organization         Address  Phone             Notes  [place] of [place] - variety of support groups  [place] Call for more information  Narcotics Anonymous (NA), Caring [facility] [address], [employer] [place]  2 meetings at this location   Special educational needs teacher         Address  Phone  Notes  ASAP Residential Treatment [place],    [place]  [phone]   [facility]  [address], [place] [address]96283, [place], [place]   [place], [place] [phone] Admissions: 8am-3pm M-F  Incentives Substance [place] [address],    [place], [place] [phone]   [facility] [address] [place], [place], [place]   [facility] [address],  [place], [place]   [facility] Programs - Intensive Outpatient [address], [place], [place]   [facility] [address].,  [place], [place] [phone] or [phone]   [facility] (RTS) [address], [place], [place] Accepts Medicaid  [facility] [place] [address],  [place] [phone] Substance Abuse/Addiction Treatment   [facility] Organization         Address  Phone  Notes  [facility]  [phone]   Domenic Schwab, PhD [address] [place], [place]   [phone] or [phone]   [place] [address] [place], [place] [phone]   [facility] [address], [place], [place] [phone] Insurance/Medicaid/sponsorship through [facility] and Families [address] [place] [place]                                    [place], [place] [phone] [place] [address] Street[place], [place] [phone]    Dr. Adele Schilder  [phone]   [facility]. 1) [address] [place] 2) [place] 3)  [address], [place] [phone]  [phone]   [place] [phone] or [phone] (After Hours)

## 2014-02-14 NOTE — ED Notes (Signed)
PT reports chronic pain to RT knee. Pain today is 10/10

## 2014-03-15 ENCOUNTER — Emergency Department (HOSPITAL_COMMUNITY)
Admission: EM | Admit: 2014-03-15 | Discharge: 2014-03-16 | Disposition: A | Discharge status: Other Acute Inpatient Hospital | Payer: Federal, State, Local not specified - Other | Attending: Emergency Medicine | Admitting: Emergency Medicine

## 2014-03-15 ENCOUNTER — Encounter (HOSPITAL_COMMUNITY): Payer: Self-pay | Admitting: *Deleted

## 2014-03-15 DIAGNOSIS — F1010 Alcohol abuse, uncomplicated: Secondary | ICD-10-CM | POA: Insufficient documentation

## 2014-03-15 DIAGNOSIS — F1110 Opioid abuse, uncomplicated: Secondary | ICD-10-CM | POA: Insufficient documentation

## 2014-03-15 DIAGNOSIS — F314 Bipolar disorder, current episode depressed, severe, without psychotic features: Secondary | ICD-10-CM | POA: Insufficient documentation

## 2014-03-15 DIAGNOSIS — Z8739 Personal history of other diseases of the musculoskeletal system and connective tissue: Secondary | ICD-10-CM | POA: Insufficient documentation

## 2014-03-15 DIAGNOSIS — F1910 Other psychoactive substance abuse, uncomplicated: Secondary | ICD-10-CM

## 2014-03-15 DIAGNOSIS — F419 Anxiety disorder, unspecified: Secondary | ICD-10-CM | POA: Insufficient documentation

## 2014-03-15 DIAGNOSIS — F1920 Other psychoactive substance dependence, uncomplicated: Secondary | ICD-10-CM

## 2014-03-15 DIAGNOSIS — F1410 Cocaine abuse, uncomplicated: Secondary | ICD-10-CM | POA: Insufficient documentation

## 2014-03-15 DIAGNOSIS — R45851 Suicidal ideations: Secondary | ICD-10-CM

## 2014-03-15 DIAGNOSIS — Z791 Long term (current) use of non-steroidal anti-inflammatories (NSAID): Secondary | ICD-10-CM | POA: Insufficient documentation

## 2014-03-15 DIAGNOSIS — Z87448 Personal history of other diseases of urinary system: Secondary | ICD-10-CM | POA: Insufficient documentation

## 2014-03-15 DIAGNOSIS — Z79899 Other long term (current) drug therapy: Secondary | ICD-10-CM | POA: Insufficient documentation

## 2014-03-15 DIAGNOSIS — R45850 Homicidal ideations: Secondary | ICD-10-CM

## 2014-03-15 DIAGNOSIS — Z8719 Personal history of other diseases of the digestive system: Secondary | ICD-10-CM | POA: Insufficient documentation

## 2014-03-15 LAB — COMPREHENSIVE METABOLIC PANEL
ALT: 34 U/L (ref 0–53)
AST: 80 U/L — ABNORMAL HIGH (ref 0–37)
Albumin: 4.2 g/dL (ref 3.5–5.2)
Alkaline Phosphatase: 52 U/L (ref 39–117)
Anion gap: 11 (ref 5–15)
BILIRUBIN TOTAL: 0.9 mg/dL (ref 0.3–1.2)
BUN: 16 mg/dL (ref 6–23)
CO2: 23 mmol/L (ref 19–32)
CREATININE: 1.1 mg/dL (ref 0.50–1.35)
Calcium: 8.7 mg/dL (ref 8.4–10.5)
Chloride: 103 mEq/L (ref 96–112)
GFR calc Af Amer: >90 mL/min (ref >90)
GFR, EST NON AFRICAN AMERICAN: 78 mL/min — AB (ref >90)
Glucose, Bld: 100 mg/dL — ABNORMAL HIGH (ref 70–99)
POTASSIUM: 4 mmol/L (ref 3.5–5.1)
Sodium: 137 mmol/L (ref 135–145)
Total Protein: 7.1 g/dL (ref 6.0–8.3)

## 2014-03-15 LAB — CBC
HEMATOCRIT: 38.4 % — AB (ref 39.0–52.0)
HEMOGLOBIN: 13.5 g/dL (ref 13.0–17.0)
MCH: 31.4 pg (ref 26.0–34.0)
MCHC: 35.2 g/dL (ref 30.0–36.0)
MCV: 89.3 fL (ref 78.0–100.0)
PLATELETS: 355 10*3/uL (ref 150–400)
RBC: 4.3 MIL/uL (ref 4.22–5.81)
RDW: 13 % (ref 11.5–15.5)
WBC: 6.7 10*3/uL (ref 4.0–10.5)

## 2014-03-15 LAB — RAPID URINE DRUG SCREEN, HOSP PERFORMED
AMPHETAMINES: NOT DETECTED
BARBITURATES: NOT DETECTED
Benzodiazepines: NOT DETECTED
Cocaine: POSITIVE — AB
OPIATES: POSITIVE — AB
TETRAHYDROCANNABINOL: NOT DETECTED

## 2014-03-15 LAB — SALICYLATE LEVEL

## 2014-03-15 LAB — ACETAMINOPHEN LEVEL: Acetaminophen (Tylenol), Serum: 15.6 ug/mL (ref 10–30)

## 2014-03-15 LAB — ETHANOL

## 2014-03-15 MED ORDER — VITAMIN B-1 100 MG PO TABS
100.0000 mg | ORAL_TABLET | Freq: Every day | ORAL | Status: DC
  Administered 2014-03-15 – 2014-03-16 (×2): 100 mg via ORAL
  Filled 2014-03-15 (×2): qty 1

## 2014-03-15 MED ORDER — TRAZODONE HCL 50 MG PO TABS
50.0000 mg | ORAL_TABLET | Freq: Every evening | ORAL | Status: DC | PRN
  Administered 2014-03-15 – 2014-03-16 (×2): 50 mg via ORAL
  Filled 2014-03-15 (×2): qty 1

## 2014-03-15 MED ORDER — HALOPERIDOL LACTATE 5 MG/ML IJ SOLN
5.0000 mg | Freq: Once | INTRAMUSCULAR | Status: AC
  Administered 2014-03-15: 5 mg via INTRAMUSCULAR
  Filled 2014-03-15: qty 1

## 2014-03-15 MED ORDER — THIAMINE HCL 100 MG/ML IJ SOLN: 100.0000 mg | Freq: Every day | INTRAMUSCULAR | Status: DC

## 2014-03-15 MED ORDER — NAPROXEN 500 MG PO TABS
500.0000 mg | ORAL_TABLET | Freq: Two times a day (BID) | ORAL | Status: DC | PRN
  Administered 2014-03-16 (×2): 500 mg via ORAL
  Filled 2014-03-15 (×2): qty 1

## 2014-03-15 MED ORDER — NICOTINE 21 MG/24HR TD PT24
21.0000 mg | MEDICATED_PATCH | Freq: Every day | TRANSDERMAL | Status: DC
  Filled 2014-03-15: qty 1

## 2014-03-15 MED ORDER — ONDANSETRON HCL 4 MG PO TABS
4.0000 mg | ORAL_TABLET | Freq: Three times a day (TID) | ORAL | Status: DC | PRN
  Administered 2014-03-16: 4 mg via ORAL
  Filled 2014-03-15: qty 1

## 2014-03-15 MED ORDER — LOPERAMIDE HCL 2 MG PO CAPS
2.0000 mg | ORAL_CAPSULE | ORAL | Status: DC | PRN
Start: 2014-03-15 — End: 2014-03-16

## 2014-03-15 MED ORDER — HALOPERIDOL 5 MG PO TABS
5.0000 mg | ORAL_TABLET | Freq: Once | ORAL | Status: AC
  Administered 2014-03-15: 5 mg via ORAL
  Filled 2014-03-15: qty 1

## 2014-03-15 MED ORDER — ZIPRASIDONE MESYLATE 20 MG IM SOLR: 10.0000 mg | Freq: Once | INTRAMUSCULAR | Status: DC

## 2014-03-15 MED ORDER — METHOCARBAMOL 500 MG PO TABS
500.0000 mg | ORAL_TABLET | Freq: Three times a day (TID) | ORAL | Status: DC | PRN
  Administered 2014-03-16 (×2): 500 mg via ORAL
  Filled 2014-03-15 (×2): qty 1

## 2014-03-15 MED ORDER — LORAZEPAM 1 MG PO TABS
2.0000 mg | ORAL_TABLET | Freq: Once | ORAL | Status: AC
  Administered 2014-03-15: 2 mg via ORAL
  Filled 2014-03-15: qty 2

## 2014-03-15 MED ORDER — ALUM & MAG HYDROXIDE-SIMETH 200-200-20 MG/5ML PO SUSP: 30.0000 mL | ORAL | Status: DC | PRN

## 2014-03-15 MED ORDER — HYDROXYZINE HCL 25 MG PO TABS
25.0000 mg | ORAL_TABLET | Freq: Four times a day (QID) | ORAL | Status: DC | PRN
  Administered 2014-03-15 – 2014-03-16 (×2): 25 mg via ORAL
  Filled 2014-03-15 (×2): qty 1

## 2014-03-15 MED ORDER — DICYCLOMINE HCL 20 MG PO TABS
20.0000 mg | ORAL_TABLET | Freq: Four times a day (QID) | ORAL | Status: DC | PRN
  Administered 2014-03-15: 20 mg via ORAL
  Filled 2014-03-15: qty 1

## 2014-03-15 NOTE — [facility] Counselor (Signed)
Brandon [name], [facility], confirmed adult unit is at capacity. Contacted the following facilities for placement:  INFORMATION FAXED, PT IS UNDER REVIEW: [facility]  AT CAPACITY: [facility], per [facility], per Lacey Jensenanny [facility], per [facility], per [facility], per [facility], per [facility], per [facility], per [facility], per [facility], per [facility], per [facility], per [facility], per [facility], per [facility], per Lovett CalenderGary Brynn Marr, per [facility], per [facility], per [facility], per Tess  NO RESPONSE: [facility]   [address]Merdith Adan Ellis Patsy BaltimoreWarrick Jr, WisconsinLPC, [place] IncorporatedNCC Triage Specialist [phone]

## 2014-03-15 NOTE — BH Assessment (Signed)
[facility] Assessment Progress Note    The following facilities were contacted in an attempt to place the pt:  BED AVAILABLE, FAXED CLINICAL INFORMATION: [facility], per Ovidio KinKristy [facility]- no beds per Pattricia BossAnnie, but referral faxed for possible wait list [facility]-taking referrals per [facility]-no beds per [facility], but referral faxed for possible wait list Paulino DoorVidant [facility], per [facility], per Claudine [facility], beds per [facility]  AT CAPACITY: [facility], per [facility], per [facility], per [facility], per [facility], per [facility], per [facility], per Gaye AlkenLarissa Brynn Marr per Yolonda KidaLacy [facility] per Baxter HireKristen  NO RESPONSE: [facility]  [place]  PT DECLINED: n/a  TTS will continue to seek placement for the pt.  Casimer LaniusKristen Rimas Gilham, MS, [facility] Licensed Professional Counselor Therapeutic Triage Specialist [facility] Phone: [phone] Fax: [phone]

## 2014-03-15 NOTE — ED Provider Notes (Signed)
CSN: [id number]     Arrival date & time 03/15/14  1033 History   First MD Initiated Contact with Patient 03/15/14 1036     Chief Complaint  Patient presents with  . Medical Clearance     (Consider location/radiation/quality/duration/timing/severity/associated sxs/prior Treatment) HPI   Brandon [name] is a 49 y.o. male with PMHx of opiate abuse,bipolar disorder, renal failure, polysubstance abuse, Rhabdomyolysis, Depression, and Bipolar affective disorder who presents to the Emergency Department complaining of suicidal ideation, polysubstance abuse binge of cocaine, heroine, and, alcohol for the past 14 days. He has not been taking any of his home medications because of this. He last used at 10 pm last night.He is depressed and was going to commit suicide last night and had 1 gram of heroine he was about to shut up, had his arm tied off and everything, but his dad found him and intervened. He reports being homeless and sleeping on a bench last night. Every penny he gets goes towards drugs and alcohol. He is homicidal and afraid he will hurt anyone who has a penny on them so that he can buy more drugs. The pt is currently manic and shaving with a dry razor and pacing back and forth in the exam room. He is here voluntarily- feels that he is actively going into detox.  Pt reports feeling that he is going to get very violent and is trained in martial arts.   PO of Ativan and 5 mg IM Haldol ordered in traige.  Past Medical History  Diagnosis Date  . Drug abuse   . Kidney failure   . Bipolar 1 disorder   . Rhabdomyolysis   . Acute renal failure   . Opiate abuse, continuous   . Dental caries     extensive  . Anxiety   . Depression   . Bipolar affective disorder 03/17/2012  . Mood disorder 03/16/2012   Past Surgical History  Procedure Laterality Date  . Knee surgery      x 5   Family History  Problem Relation Age of Onset  . Heart disease Mother   . Heart disease Father    History   Substance Use Topics  . Smoking status: Never Smoker   . Smokeless tobacco: Never Used  . Alcohol Use: No    Review of Systems  10 Systems reviewed and are negative for acute change except as noted in the HPI.     Allergies  Other  Home Medications   Prior to Admission medications   Medication Sig Start Date End Date Taking? Authorizing Provider  diphenhydramine-acetaminophen (TYLENOL PM) 25-500 MG TABS Take 3 tablets by mouth at bedtime as needed.   Yes Historical Provider, MD  lurasidone (LATUDA) 40 MG TABS tablet Take 40 mg by mouth daily.   Yes Historical Provider, MD  clonazePAM (KLONOPIN) 1 MG tablet Take 1 tablet (1 mg total) by mouth every 6 (six) hours as needed (anxiety). Patient not taking: Reported on 03/15/2014 11/10/13   Sanjuana Kava, NP  diclofenac (VOLTAREN) 75 MG EC tablet Take 75 mg by mouth 2 (two) times daily. 02/22/14   Historical Provider, MD  naproxen (NAPROSYN) 500 MG tablet Take 1 tablet (500 mg total) by mouth 2 (two) times daily with a meal. Patient not taking: Reported on 03/15/2014 02/14/14   Harle Battiest, NP  traMADol (ULTRAM) 50 MG tablet Take 2 tablets (100 mg total) by mouth every 6 (six) hours as needed for severe pain. Patient not taking: Reported on  03/15/2014 11/10/13   Sanjuana KavaAgnes I Nwoko, NP  traMADol (ULTRAM) 50 MG tablet Take 1 tablet (50 mg total) by mouth every 6 (six) hours as needed. Patient not taking: Reported on 03/15/2014 02/14/14   Harle BattiestElizabeth Tysinger, NP  traZODone (DESYREL) 50 MG tablet Take 1 tablet (50 mg total) by mouth at bedtime and may repeat dose one time if needed. For sleep Patient not taking: Reported on 03/15/2014 11/10/13   Sanjuana KavaAgnes I Nwoko, NP  ziprasidone (GEODON) 60 MG capsule Take 2 capsules (120 mg total) by mouth at bedtime. For mood control Patient not taking: Reported on 03/15/2014 11/10/13   Sanjuana KavaAgnes I Nwoko, NP  zolpidem (AMBIEN) 10 MG tablet Take 10 mg by mouth at bedtime as needed for sleep. Repeat in 1 hour if needed     Historical Provider, MD   BP 120/73 mmHg  Pulse 83  Temp(Src) 98.3 F (36.8 C) (Oral)  Resp 38  SpO2 96% Physical Exam  Constitutional: He appears well-developed and well-nourished. No distress.  HENT:  Head: Normocephalic and atraumatic.  Eyes: Pupils are equal, round, and reactive to light.  Neck: Normal range of motion. Neck supple.  Cardiovascular: Normal rate and regular rhythm.   Pulmonary/Chest: Effort normal.  Abdominal: Soft.  Neurological: He is alert.  Skin: Skin is warm and dry.  Psychiatric: His mood appears anxious. His speech is rapid and/or pressured. He exhibits a depressed mood. He expresses homicidal and suicidal ideation. He expresses suicidal plans (shooting up 1 gram of heroin) and homicidal plans (anyway he needs to in order to get someone elses money for drugs).  Nursing note and vitals reviewed.   ED Course  Procedures (including critical care time) Labs Review Labs Reviewed  CBC - Abnormal; Notable for the following:    HCT 38.4 (*)    All other components within normal limits  COMPREHENSIVE METABOLIC PANEL - Abnormal; Notable for the following:    Glucose, Bld 100 (*)    AST 80 (*)    GFR calc non Af Amer 78 (*)    All other components within normal limits  URINE RAPID DRUG SCREEN (HOSP PERFORMED) - Abnormal; Notable for the following:    Opiates POSITIVE (*)    Cocaine POSITIVE (*)    All other components within normal limits  ACETAMINOPHEN LEVEL  ETHANOL  SALICYLATE LEVEL    Imaging Review No results found.   EKG Interpretation None      MDM   Final diagnoses:  Suicidal ideation  Homicidal ideations  Polysubstance abuse  Bipolar disorder, current episode depressed, severe, without psychotic features    Medications  thiamine (VITAMIN B-1) tablet 100 mg (100 mg Oral Given 03/15/14 1130)    Or  thiamine (B-1) injection 100 mg ( Intravenous See Alternative 03/15/14 1130)  nicotine (NICODERM CQ - dosed in mg/24 hours) patch 21 mg  (21 mg Transdermal Not Given 03/15/14 1131)  ondansetron (ZOFRAN) tablet 4 mg (not administered)  alum & mag hydroxide-simeth (MAALOX/MYLANTA) 200-200-20 MG/5ML suspension 30 mL (not administered)  LORazepam (ATIVAN) tablet 2 mg (2 mg Oral Given 03/15/14 1102)  haloperidol lactate (HALDOL) injection 5 mg (5 mg Intramuscular Given 03/15/14 1117)    Patients moved to TCU. Labs pending Home meds need pharmacy review. Holding meds ordered Patient started on CIWA protocol. [facility] has determine patient needs inpatient treatment. Lab results reviewed  Dorthula Matasiffany G Marelly Wehrman, PA-C 03/15/14 1626  Kristen N Ward, DO 03/18/14 [id number]

## 2014-03-15 NOTE — ED Notes (Signed)
Pt reports he is depressed and suicidal. Had a gram of heroin drawn up and arm tied up and was going to shoot it up "to just not wake up, life is depressing." Sts he uses cocaine, heroin, etoh daily. Last use last night 10pm. Sts his dad found him this am crying at his home (pt sts he is homeless) and stopped him from shooting up and asked him to come to the hospital to get help. [facility]. Sts he has not been taking his psych meds for last 5 days, he has them but stopped taking because he has been getting high. Pt also reports homicidal ideation towards anyone "that has money in their pockets." Pt c/o generalized body pain r/t detox per pt.

## 2014-03-15 NOTE — BH Assessment (Signed)
Assessment Note  Brandon SearlesJohn J [name] is an 49 y.o. male. Patient was brought into the ED by his father because of suicidal ideations with a plan.  Patient continues to endorse suicidal ideations with a plan to overdose.  Patient reports he slept in his father's car last night and his father interrupted him this morning attempting to overdose on heroin.  Patient reports access to guns, knives, and automatic weapons.  Patient reports homicidal ideations with no intended victim but fears of snapping on anyone that angers him.  Patient reports thoughts of fantasying of hurting other people when he is in active withdrawals.    Patient reports a long history of substance abuse such as cocaine, heroin, pain pills, benzodiazepine, amphetamine, and hallunogens.  Patient reports using cocaine since the age of 49, daily, snort and IV, $100 or more, and last used 03/14/2014.  Patient reports using heroin since the age of 49, IV, a gram or more, last used 03/14/2014.  Patient reports using alcohol since the age of 89, fifth of vodka daily, last drank 03/14/2014.  Patient reports using pain pills, Oxcycotin, Vicodin, opana, various amount, and last used a week ago.  Patient reports using atterrol, started 1 year ago, snorting, and last used a month ago.  Patient reports withdrawal symptoms such as nausea, vomiting, tremors, and agitation.  Patient denies a history of seizures.    CSW consulted with Julieanne CottonJosephine, NP it is recommended to refer for inpatient treatment for safety and stabilization.     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 Drug abuse   . Kidney failure   . Bipolar 1 disorder   . Rhabdomyolysis   . Acute renal failure   . Opiate abuse, continuous   . Dental caries     extensive  . Anxiety   . Depression   . Bipolar affective  disorder 03/17/2012  . Mood disorder 03/16/2012   Axis IV: economic problems, housing problems, occupational problems, other psychosocial or environmental problems, problems related to social environment, problems with access to health care services and problems with primary support group Axis V: 41-50 serious symptoms  Past Medical History:  Past Medical History  Diagnosis Date  . Drug abuse   . Kidney failure   . Bipolar 1 disorder   . Rhabdomyolysis   . Acute renal failure   . Opiate abuse, continuous   . Dental caries     extensive  . Anxiety   . Depression   . Bipolar affective disorder 03/17/2012  . Mood disorder 03/16/2012    Past Surgical History  Procedure Laterality Date  . Knee surgery      x 5    Family History:  Family History  Problem Relation Age of Onset  . Heart disease Mother   . Heart disease Father     Social History:  reports that he has never smoked. He has never used smokeless tobacco. He reports that he uses illicit drugs (Oxycodone, Amphetamines, Barbituates, Cocaine, and Methamphetamines). He reports that he does not drink alcohol.  Additional Social History:     CIWA: CIWA-Ar BP: 120/73 mmHg Pulse Rate: 83 COWS:    Allergies:  Allergies  Allergen Reactions  . Other Anaphylaxis and Other (See Comments)    Lima, kidney and pork & beans     Home Medications:  (Not in a hospital  admission)  OB/GYN Status:  No LMP for male patient.  General Assessment Data Location of Assessment: [facility] ACT Assessment: Yes Is this a Tele or Face-to-Face Assessment?: Face-to-Face Is this an Initial Assessment or a Re-assessment for this encounter?: Initial Assessment Living Arrangements: Other (Comment) (homeless) Can pt return to current living arrangement?: Yes Admission Status: Voluntary Is patient capable of signing voluntary admission?: Yes Transfer from: Other (Comment) Referral Source: Self/Family/Friend  Medical Screening Exam [facility] Walk-in  ONLY) Medical Exam completed: Yes  [facility] Crisis Care Plan Living Arrangements: Other (Comment) (homeless) Name of Psychiatrist: none Name of Therapist: none  Education Status Is patient currently in school?: No  Risk to self with the past 6 months Suicidal Ideation: Yes-Currently Present Suicidal Intent: Yes-Currently Present Is patient at risk for suicide?: Yes Suicidal Plan?: Yes-Currently Present Specify Current Suicidal Plan: overdose Access to Means: Yes Specify Access to Suicidal Means: illicit drugh What has been your use of drugs/alcohol within the last 12 months?: alcohol, cocaine, opiates, benzo, amphetamines Previous Attempts/Gestures: Yes How many times?: 2 Triggers for Past Attempts: Other (Comment), Family contact (SA) Intentional Self Injurious Behavior: None Family Suicide History: No Recent stressful life event(s): Conflict (Comment), Loss (Comment), Job Loss, Financial Problems, Legal Issues, Other (Comment) (SA, homelessness) Persecutory voices/beliefs?: No Depression: Yes Depression Symptoms: Despondent, Loss of interest in usual pleasures, Guilt, Feeling angry/irritable Substance abuse history and/or treatment for substance abuse?: Yes  Risk to Others within the past 6 months Homicidal Ideation: Yes-Currently Present Thoughts of Harm to Others: No Current Homicidal Intent: No Current Homicidal Plan: No Access to Homicidal Means: Yes Describe Access to Homicidal Means: weapons Identified Victim: none History of harm to others?: Yes Assessment of Violence: None Noted Does patient have access to weapons?: Yes (Comment) Criminal Charges Pending?: No  Psychosis Hallucinations: None noted Delusions: Unspecified  Mental Status Report Appear/Hygiene: Disheveled, Bizarre, In hospital gown Eye Contact: Poor Motor Activity: Restlessness Speech: Slurred, Pressured Level of Consciousness: Irritable, Drowsy Mood: Labile Affect: Labile Anxiety Level:  Severe Thought Processes: Relevant Judgement: Impaired Orientation: Person, Place, Situation Obsessive Compulsive Thoughts/Behaviors: None  Cognitive Functioning Concentration: Poor Memory: Recent Intact, Remote Intact IQ: Average Insight: Poor Impulse Control: Poor Appetite: Good Sleep: No Change Vegetative Symptoms: None  ADLScreening [facility] Assessment Services) Patient's cognitive ability adequate to safely complete daily activities?: Yes Patient able to express need for assistance with ADLs?: Yes Independently performs ADLs?: Yes (appropriate for developmental age)  Prior Inpatient Therapy Prior Inpatient Therapy: Yes Prior Therapy Dates: unknown Prior Therapy Facilty/Provider(s): [facility], [facility], [facility], [facility], [place] Reason for Treatment: SA, SI  Prior Outpatient Therapy Prior Outpatient Therapy: No  ADL Screening (condition at time of admission) Patient's cognitive ability adequate to safely complete daily activities?: Yes Patient able to express need for assistance with ADLs?: Yes Independently performs ADLs?: Yes (appropriate for developmental age)             Advance Directives (For Healthcare) Does patient have an advance directive?: No    Additional Information 1:1 In Past 12 Months?: No CIRT Risk: No Elopement Risk: No Does patient have medical clearance?: Yes     Disposition:  Disposition Initial Assessment Completed for this Encounter: Yes Disposition of Patient: Inpatient treatment program Type of inpatient treatment program: Adult  On Site Evaluation by:   Reviewed with Physician:    Maryelizabeth Rowan A 03/15/2014 12:07 PM

## 2014-03-15 NOTE — Progress Notes (Signed)
Patient ID: Brandon SearlesJohn J [name], male   DOB: 10/29/1965, 49 y.o.   MRN: [id number] This writer attempted to evaluate patient but could not.  Patient had his eye closed and stated that he was given Haldol in the ER and that he need to sleep.  Patient with eyes closed requested to be left alone and stated that it takes nothing to get him agitated where he could "snap".  Patient was left alone and we will evaluate when patient is able to participate FULLY,   awake and alert.  Dahlia ByesJosephine Queena Monrreal, PMHNP-BC

## 2014-03-15 NOTE — Consult Note (Signed)
[facility] Face-to-Face Psychiatry Consult   Reason for Consult:  Polysubstance dependence, Suicidal idaetion Referring Physician:  EDP Brandon [name] is an 49 y.o. male. Total Time spent with patient: 1 hour  Assessment: AXIS I:  Major Depression, Recurrent severe and SUICIDE IDEATION, POLYSUBSTANCE DEPENDENCE AXIS II:  Deferred AXIS III:   Past Medical History  Diagnosis Date  . Drug abuse   . Kidney failure   . Bipolar 1 disorder   . Rhabdomyolysis   . Acute renal failure   . Opiate abuse, continuous   . Dental caries     extensive  . Anxiety   . Depression   . Bipolar affective disorder 03/17/2012  . Mood disorder 03/16/2012   AXIS IV:  other psychosocial or environmental problems and problems related to social environment AXIS V:  41-50 serious symptoms  Plan:  Recommend psychiatric Inpatient admission when medically cleared.  Subjective:   Brandon [name] is a 49 y.o. male patient admitted with mood disorder, Substance dependence.  HPI: Caucasian male, 49 years old was seen for endorsing suicide with plans to OD on IV Heroin.  Patient stated that he drew Heroin in a syringe and was about to inject when his father walked in and stopped him from doing that.  Patient was last admitted at our 300 hall unit for Polysubstance dependence September of last year.  Patient stated that he has been using "any drug out there"  Patient stated that he also drinks any amount of Alcohol he can get.  Patient was not able to describe the quantity and last use of Alcohol but stated that he last used Heroin yesterday, Cocaine last night.    Patient reported previous suicide  attempt by hanging but failed to state when and where.  He also stated that he sees a provider at [facility] for his Kasandra Knudsen which is the only medication he takes for Bipolar and depression.  Patient stated that he has not been compliant with his Latuda  but uses drugs and Alcohol to treat self.  Patient reports that he is having withdrawal  symptoms as in Nausea, cramps, anxiety, sweating and restlessness.  Patient requested Suboxone or Methadone and was informed we do not use these two medications in the ER.  Patient also constantly requested Haldol stating"knock me out with Haldol"  Patient was given Haldol in the ER when he arrived and was very agitated.  Patient is not able to contract for safety and stated that he is also homicidal and will hurt any body that look like they have money"    He denied AVH. We will start his detox with our clonidine protocol and will look for bed at any hospital with available beds for inpatient Psychiatric  Hospitalization for suicidal and homicidal thoughts.  HPI Elements:   Location:  Polysubstance Dependence( Heroin, COCAINE, Alcohol), Bipolar disorder, . Quality:  SWEATING, ANXIETY, IRRITABILITY, CRAMS, NAUSEA. Severity:  SEVERE. Timing:  Acute. Duration:  ongoing off and on since age 66. Context:  Seeking treatment for Depression and Bipolar disorder.  Past Psychiatric History: Past Medical History  Diagnosis Date  . Drug abuse   . Kidney failure   . Bipolar 1 disorder   . Rhabdomyolysis   . Acute renal failure   . Opiate abuse, continuous   . Dental caries     extensive  . Anxiety   . Depression   . Bipolar affective disorder 03/17/2012  . Mood disorder 03/16/2012    reports that he has never smoked.  He has never used smokeless tobacco. He reports that he uses illicit drugs (Oxycodone, Amphetamines, Barbituates, Cocaine, and Methamphetamines). He reports that he does not drink alcohol. Family History  Problem Relation Age of Onset  . Heart disease Mother   . Heart disease Father    Family History Substance Abuse: Yes, Describe: Family Supports: No Living Arrangements: Other (Comment) (homeless) Can pt return to current living arrangement?: Yes   Allergies:   Allergies  Allergen Reactions  . Other Anaphylaxis and Other (See Comments)    Lima, kidney and pork & beans      ACT Assessment Complete:  Yes:    Educational Status    Risk to Self: Risk to self with the past 6 months Suicidal Ideation: Yes-Currently Present Suicidal Intent: Yes-Currently Present Is patient at risk for suicide?: Yes Suicidal Plan?: Yes-Currently Present Specify Current Suicidal Plan: overdose Access to Means: Yes Specify Access to Suicidal Means: illicit drugh What has been your use of drugs/alcohol within the last 12 months?: alcohol, cocaine, opiates, benzo, amphetamines Previous Attempts/Gestures: Yes How many times?: 2 Triggers for Past Attempts: Other (Comment), Family contact (SA) Intentional Self Injurious Behavior: None Family Suicide History: No Recent stressful life event(s): Conflict (Comment), Loss (Comment), Job Loss, Financial Problems, Legal Issues, Other (Comment) (SA, homelessness) Persecutory voices/beliefs?: No Depression: Yes Depression Symptoms: Despondent, Loss of interest in usual pleasures, Guilt, Feeling angry/irritable Substance abuse history and/or treatment for substance abuse?: Yes  Risk to Others: Risk to Others within the past 6 months Homicidal Ideation: Yes-Currently Present Thoughts of Harm to Others: No Current Homicidal Intent: No Current Homicidal Plan: No Access to Homicidal Means: Yes Describe Access to Homicidal Means: weapons Identified Victim: none History of harm to others?: Yes Assessment of Violence: None Noted Does patient have access to weapons?: Yes (Comment) Criminal Charges Pending?: No  Abuse:    Prior Inpatient Therapy: Prior Inpatient Therapy Prior Inpatient Therapy: Yes Prior Therapy Dates: unknown Prior Therapy Facilty/Provider(s): [facility], [facility], [facility], [facility], [place] Reason for Treatment: SA, SI  Prior Outpatient Therapy: Prior Outpatient Therapy Prior Outpatient Therapy: No  Additional Information: Additional Information 1:1 In Past 12 Months?: No CIRT Risk: No Elopement Risk: No Does patient have medical  clearance?: Yes   Objective: Blood pressure 109/53, pulse 69, temperature 98.5 F (36.9 C), temperature source Oral, resp. rate 17, SpO2 99 %.There is no weight on file to calculate BMI. Results for orders placed or performed during the hospital encounter of 03/15/14 (from the past 72 hour(s))  Urine Drug Screen     Status: Abnormal   Collection Time: 03/15/14 10:48 AM  Result Value Ref Range   Opiates POSITIVE (A) NONE DETECTED   Cocaine POSITIVE (A) NONE DETECTED   Benzodiazepines NONE DETECTED NONE DETECTED   Amphetamines NONE DETECTED NONE DETECTED   Tetrahydrocannabinol NONE DETECTED NONE DETECTED   Barbiturates NONE DETECTED NONE DETECTED    Comment:        DRUG SCREEN FOR MEDICAL PURPOSES ONLY.  IF CONFIRMATION IS NEEDED FOR ANY PURPOSE, NOTIFY LAB WITHIN 5 DAYS.        LOWEST DETECTABLE LIMITS FOR URINE DRUG SCREEN Drug Class       Cutoff (ng/mL) Amphetamine      1000 Barbiturate      200 Benzodiazepine   188 Tricyclics       416 Opiates          300 Cocaine          300 THC  50   Acetaminophen level     Status: None   Collection Time: 03/15/14 11:05 AM  Result Value Ref Range   Acetaminophen (Tylenol), Serum 15.6 10 - 30 ug/mL    Comment:        THERAPEUTIC CONCENTRATIONS VARY SIGNIFICANTLY. A RANGE OF 10-30 ug/mL MAY BE AN EFFECTIVE CONCENTRATION FOR MANY PATIENTS. HOWEVER, SOME ARE BEST TREATED AT CONCENTRATIONS OUTSIDE THIS RANGE. ACETAMINOPHEN CONCENTRATIONS >150 ug/mL AT 4 HOURS AFTER INGESTION AND >50 ug/mL AT 12 HOURS AFTER INGESTION ARE OFTEN ASSOCIATED WITH TOXIC REACTIONS.   Ethanol (ETOH)     Status: None   Collection Time: 03/15/14 11:05 AM  Result Value Ref Range   Alcohol, Ethyl (B) <5 0 - 9 mg/dL    Comment:        LOWEST DETECTABLE LIMIT FOR SERUM ALCOHOL IS 11 mg/dL FOR MEDICAL PURPOSES ONLY   Salicylate level     Status: None   Collection Time: 03/15/14 11:05 AM  Result Value Ref Range   Salicylate Lvl <4.0 2.8 -  20.0 mg/dL  CBC     Status: Abnormal   Collection Time: 03/15/14 11:06 AM  Result Value Ref Range   WBC 6.7 4.0 - 10.5 K/uL   RBC 4.30 4.22 - 5.81 MIL/uL   Hemoglobin 13.5 13.0 - 17.0 g/dL   HCT 89.7 (L) 35.7 - 24.2 %   MCV 89.3 78.0 - 100.0 fL   MCH 31.4 26.0 - 34.0 pg   MCHC 35.2 30.0 - 36.0 g/dL   RDW 42.4 44.5 - 36.7 %   Platelets 355 150 - 400 K/uL  Comprehensive metabolic panel     Status: Abnormal   Collection Time: 03/15/14 11:06 AM  Result Value Ref Range   Sodium 137 135 - 145 mmol/L    Comment: Please note change in reference range.   Potassium 4.0 3.5 - 5.1 mmol/L    Comment: Please note change in reference range.   Chloride 103 96 - 112 mEq/L   CO2 23 19 - 32 mmol/L   Glucose, Bld 100 (H) 70 - 99 mg/dL   BUN 16 6 - 23 mg/dL   Creatinine, Ser 6.11 0.50 - 1.35 mg/dL   Calcium 8.7 8.4 - 41.1 mg/dL   Total Protein 7.1 6.0 - 8.3 g/dL   Albumin 4.2 3.5 - 5.2 g/dL   AST 80 (H) 0 - 37 U/L   ALT 34 0 - 53 U/L   Alkaline Phosphatase 52 39 - 117 U/L   Total Bilirubin 0.9 0.3 - 1.2 mg/dL   GFR calc non Af Amer 78 (L) >90 mL/min   GFR calc Af Amer >90 >90 mL/min    Comment: (NOTE) The eGFR has been calculated using the CKD EPI equation. This calculation has not been validated in all clinical situations. eGFR's persistently <90 mL/min signify possible Chronic Kidney Disease.    Anion gap 11 5 - 15   Labs are reviewed and are pertinent for UDS positive for Opiates and Cocaine..  Current Facility-Administered Medications  Medication Dose Route Frequency Provider Last Rate Last Dose  . alum & mag hydroxide-simeth (MAALOX/MYLANTA) 200-200-20 MG/5ML suspension 30 mL  30 mL Oral PRN Dorthula Matas, PA-C      . dicyclomine (BENTYL) tablet 20 mg  20 mg Oral Q6H PRN Earney Navy, NP      . hydrOXYzine (ATARAX/VISTARIL) tablet 25 mg  25 mg Oral Q6H PRN Earney Navy, NP      . loperamide (IMODIUM) capsule  2-4 mg  2-4 mg Oral PRN Delfin Gant, NP      .  methocarbamol (ROBAXIN) tablet 500 mg  500 mg Oral Q8H PRN Delfin Gant, NP      . naproxen (NAPROSYN) tablet 500 mg  500 mg Oral BID PRN Delfin Gant, NP      . nicotine (NICODERM CQ - dosed in mg/24 hours) patch 21 mg  21 mg Transdermal Daily Linus Mako, PA-C   21 mg at 03/15/14 1131  . ondansetron (ZOFRAN) tablet 4 mg  4 mg Oral Q8H PRN Linus Mako, PA-C      . thiamine (VITAMIN B-1) tablet 100 mg  100 mg Oral Daily Linus Mako, PA-C   100 mg at 03/15/14 1130   Or  . thiamine (B-1) injection 100 mg  100 mg Intravenous Daily Linus Mako, PA-C       Current Outpatient Prescriptions  Medication Sig Dispense Refill  . diphenhydramine-acetaminophen (TYLENOL PM) 25-500 MG TABS Take 3 tablets by mouth at bedtime as needed.    . lurasidone (LATUDA) 40 MG TABS tablet Take 40 mg by mouth daily.    . clonazePAM (KLONOPIN) 1 MG tablet Take 1 tablet (1 mg total) by mouth every 6 (six) hours as needed (anxiety). (Patient not taking: Reported on 03/15/2014) 28 tablet 0  . diclofenac (VOLTAREN) 75 MG EC tablet Take 75 mg by mouth 2 (two) times daily.    . naproxen (NAPROSYN) 500 MG tablet Take 1 tablet (500 mg total) by mouth 2 (two) times daily with a meal. (Patient not taking: Reported on 03/15/2014) 30 tablet 0  . traMADol (ULTRAM) 50 MG tablet Take 2 tablets (100 mg total) by mouth every 6 (six) hours as needed for severe pain. (Patient not taking: Reported on 03/15/2014) 28 tablet 0  . traMADol (ULTRAM) 50 MG tablet Take 1 tablet (50 mg total) by mouth every 6 (six) hours as needed. (Patient not taking: Reported on 03/15/2014) 15 tablet 0  . traZODone (DESYREL) 50 MG tablet Take 1 tablet (50 mg total) by mouth at bedtime and may repeat dose one time if needed. For sleep (Patient not taking: Reported on 03/15/2014) 60 tablet 0  . ziprasidone (GEODON) 60 MG capsule Take 2 capsules (120 mg total) by mouth at bedtime. For mood control (Patient not taking: Reported on 03/15/2014) 60  capsule 0  . zolpidem (AMBIEN) 10 MG tablet Take 10 mg by mouth at bedtime as needed for sleep. Repeat in 1 hour if needed      Psychiatric Specialty Exam:     Blood pressure 109/53, pulse 69, temperature 98.5 F (36.9 C), temperature source Oral, resp. rate 17, SpO2 99 %.There is no weight on file to calculate BMI.  General Appearance: Casual and Disheveled  Eye Contact::  None  Speech:  Pressured  Volume:  Normal  Mood:  Anxious, Depressed and Irritable  Affect:  Congruent, Depressed and Full Range  Thought Process:  Coherent, Goal Directed and Intact  Orientation:  Full (Time, Place, and Person)  Thought Content:  suicidal and homicidal  Suicidal Thoughts:  Yes.  with intent/plan  Homicidal Thoughts:  Yes.  with intent/plan  Memory:  Immediate;   Fair Recent;   Poor Remote;   Poor  Judgement:  Poor  Insight:  Shallow  Psychomotor Activity:  Increased, Restlessness and Tremor  Concentration:  Poor  Recall:  NA  Fund of Knowledge:Poor  Language: Fair  Akathisia:  NA  Handed:  Right  AIMS (if indicated):     Assets:  Desire for Improvement  Sleep:      Musculoskeletal: Strength & Muscle Tone: lying down in bed talking, moves all ext. Gait & Station: inbed talking Patient leans: interviewed in bed  Treatment Plan Summary: Daily contact with patient to assess and evaluate symptoms and progress in treatment Medication management Seek placement at any hospital with available bed for Inpatient admission  Delfin Gant  PMHNP-BC 03/15/2014 6:26 PM  Patient discussed and I agree with assessment and plan Levonne Spiller MD

## 2014-03-16 ENCOUNTER — Inpatient Hospital Stay (HOSPITAL_COMMUNITY)
Admission: AD | Admit: 2014-03-16 | Discharge: 2014-03-21 | DRG: 897 | Disposition: A | Discharge status: Home / Self Care | Payer: Federal, State, Local not specified - Other | Source: Intra-hospital | Attending: Psychiatry | Admitting: Psychiatry

## 2014-03-16 ENCOUNTER — Encounter (HOSPITAL_COMMUNITY): Payer: Self-pay | Admitting: Behavioral Health

## 2014-03-16 DIAGNOSIS — G4700 Insomnia, unspecified: Secondary | ICD-10-CM | POA: Diagnosis present

## 2014-03-16 DIAGNOSIS — F4310 Post-traumatic stress disorder, unspecified: Secondary | ICD-10-CM | POA: Diagnosis present

## 2014-03-16 DIAGNOSIS — F314 Bipolar disorder, current episode depressed, severe, without psychotic features: Secondary | ICD-10-CM | POA: Diagnosis present

## 2014-03-16 DIAGNOSIS — F1123 Opioid dependence with withdrawal: Principal | ICD-10-CM | POA: Diagnosis present

## 2014-03-16 DIAGNOSIS — F1990 Other psychoactive substance use, unspecified, uncomplicated: Secondary | ICD-10-CM

## 2014-03-16 DIAGNOSIS — G8929 Other chronic pain: Secondary | ICD-10-CM | POA: Diagnosis present

## 2014-03-16 DIAGNOSIS — F332 Major depressive disorder, recurrent severe without psychotic features: Secondary | ICD-10-CM

## 2014-03-16 DIAGNOSIS — R51 Headache: Secondary | ICD-10-CM | POA: Diagnosis present

## 2014-03-16 DIAGNOSIS — F319 Bipolar disorder, unspecified: Secondary | ICD-10-CM | POA: Insufficient documentation

## 2014-03-16 DIAGNOSIS — M25561 Pain in right knee: Secondary | ICD-10-CM | POA: Diagnosis present

## 2014-03-16 DIAGNOSIS — Z8249 Family history of ischemic heart disease and other diseases of the circulatory system: Secondary | ICD-10-CM

## 2014-03-16 DIAGNOSIS — R45851 Suicidal ideations: Secondary | ICD-10-CM

## 2014-03-16 DIAGNOSIS — F1910 Other psychoactive substance abuse, uncomplicated: Secondary | ICD-10-CM | POA: Diagnosis present

## 2014-03-16 MED ORDER — CLONIDINE HCL 0.1 MG PO TABS
0.1000 mg | ORAL_TABLET | Freq: Every day | ORAL | Status: AC
  Administered 2014-03-20 – 2014-03-21 (×2): 0.1 mg via ORAL
  Filled 2014-03-16 (×2): qty 1

## 2014-03-16 MED ORDER — LOPERAMIDE HCL 2 MG PO CAPS: 2.0000 mg | ORAL_CAPSULE | ORAL | Status: AC | PRN

## 2014-03-16 MED ORDER — DICYCLOMINE HCL 20 MG PO TABS: 20.0000 mg | ORAL_TABLET | Freq: Four times a day (QID) | ORAL | Status: DC | PRN

## 2014-03-16 MED ORDER — CARBAMAZEPINE 200 MG PO TABS
200.0000 mg | ORAL_TABLET | Freq: Two times a day (BID) | ORAL | Status: DC
  Administered 2014-03-17 – 2014-03-18 (×3): 200 mg via ORAL
  Filled 2014-03-16 (×8): qty 1

## 2014-03-16 MED ORDER — CLONIDINE HCL 0.1 MG PO TABS
0.1000 mg | ORAL_TABLET | ORAL | Status: AC
  Administered 2014-03-18 – 2014-03-19 (×3): 0.1 mg via ORAL
  Filled 2014-03-16 (×5): qty 1

## 2014-03-16 MED ORDER — CLONIDINE HCL 0.1 MG PO TABS
0.1000 mg | ORAL_TABLET | Freq: Four times a day (QID) | ORAL | Status: AC
  Administered 2014-03-16 – 2014-03-17 (×5): 0.1 mg via ORAL
  Filled 2014-03-16 (×7): qty 1

## 2014-03-16 MED ORDER — ONDANSETRON 4 MG PO TBDP
4.0000 mg | ORAL_TABLET | Freq: Four times a day (QID) | ORAL | Status: DC | PRN
Start: 2014-03-16 — End: 2014-03-16

## 2014-03-16 MED ORDER — TRAZODONE HCL 50 MG PO TABS
50.0000 mg | ORAL_TABLET | Freq: Every evening | ORAL | Status: DC | PRN
  Administered 2014-03-16: 50 mg via ORAL
  Filled 2014-03-16: qty 1

## 2014-03-16 MED ORDER — VITAMIN B-1 100 MG PO TABS
100.0000 mg | ORAL_TABLET | Freq: Every day | ORAL | Status: DC
  Administered 2014-03-17 – 2014-03-21 (×5): 100 mg via ORAL
  Filled 2014-03-16 (×8): qty 1

## 2014-03-16 MED ORDER — CARBAMAZEPINE 200 MG PO TABS
200.0000 mg | ORAL_TABLET | Freq: Two times a day (BID) | ORAL | Status: DC
  Administered 2014-03-16 (×2): 200 mg via ORAL
  Filled 2014-03-16 (×3): qty 1

## 2014-03-16 MED ORDER — DICYCLOMINE HCL 20 MG PO TABS: 20.0000 mg | ORAL_TABLET | Freq: Four times a day (QID) | ORAL | Status: AC | PRN

## 2014-03-16 MED ORDER — HYDROXYZINE HCL 25 MG PO TABS: 25.0000 mg | ORAL_TABLET | Freq: Four times a day (QID) | ORAL | Status: DC | PRN

## 2014-03-16 MED ORDER — HYDROXYZINE HCL 25 MG PO TABS
25.0000 mg | ORAL_TABLET | Freq: Four times a day (QID) | ORAL | Status: DC | PRN
  Administered 2014-03-16: 25 mg via ORAL
  Filled 2014-03-16: qty 1

## 2014-03-16 MED ORDER — LOPERAMIDE HCL 2 MG PO CAPS: 2.0000 mg | ORAL_CAPSULE | ORAL | Status: DC | PRN

## 2014-03-16 MED ORDER — CLONIDINE HCL 0.1 MG PO TABS: 0.1000 mg | ORAL_TABLET | Freq: Every day | ORAL | Status: DC

## 2014-03-16 MED ORDER — NAPROXEN 500 MG PO TABS: 500.0000 mg | ORAL_TABLET | Freq: Two times a day (BID) | ORAL | Status: DC | PRN

## 2014-03-16 MED ORDER — MAGNESIUM HYDROXIDE 400 MG/5ML PO SUSP: 30.0000 mL | Freq: Every day | ORAL | Status: DC | PRN

## 2014-03-16 MED ORDER — THIAMINE HCL 100 MG/ML IJ SOLN: 100.0000 mg | Freq: Every day | INTRAMUSCULAR | Status: DC

## 2014-03-16 MED ORDER — CLONIDINE HCL 0.1 MG PO TABS
0.1000 mg | ORAL_TABLET | Freq: Four times a day (QID) | ORAL | Status: DC
  Administered 2014-03-16 (×3): 0.1 mg via ORAL
  Filled 2014-03-16 (×3): qty 1

## 2014-03-16 MED ORDER — METHOCARBAMOL 500 MG PO TABS
500.0000 mg | ORAL_TABLET | Freq: Three times a day (TID) | ORAL | Status: AC | PRN
  Administered 2014-03-17 – 2014-03-20 (×7): 500 mg via ORAL
  Filled 2014-03-16 (×8): qty 1

## 2014-03-16 MED ORDER — CLONIDINE HCL 0.1 MG PO TABS: 0.1000 mg | ORAL_TABLET | ORAL | Status: DC

## 2014-03-16 MED ORDER — ALUM & MAG HYDROXIDE-SIMETH 200-200-20 MG/5ML PO SUSP
30.0000 mL | ORAL | Status: DC | PRN
  Administered 2014-03-20: 30 mL via ORAL
  Filled 2014-03-16: qty 30

## 2014-03-16 MED ORDER — ACETAMINOPHEN 325 MG PO TABS
650.0000 mg | ORAL_TABLET | Freq: Four times a day (QID) | ORAL | Status: DC | PRN
  Administered 2014-03-17 – 2014-03-19 (×3): 650 mg via ORAL
  Filled 2014-03-16 (×3): qty 2

## 2014-03-16 MED ORDER — ONDANSETRON HCL 4 MG PO TABS: 4.0000 mg | ORAL_TABLET | Freq: Three times a day (TID) | ORAL | Status: DC | PRN

## 2014-03-16 MED ORDER — METHOCARBAMOL 500 MG PO TABS: 500.0000 mg | ORAL_TABLET | Freq: Three times a day (TID) | ORAL | Status: DC | PRN

## 2014-03-16 MED ORDER — NAPROXEN 500 MG PO TABS
500.0000 mg | ORAL_TABLET | Freq: Two times a day (BID) | ORAL | Status: AC | PRN
  Administered 2014-03-16 – 2014-03-20 (×6): 500 mg via ORAL
  Filled 2014-03-16 (×6): qty 1

## 2014-03-16 NOTE — Progress Notes (Signed)
Patient was [facility]: [facility]  [place] Abtsatia Giulian Goldring, LCSWA Disposition staff 03/16/2014 3:36 PM

## 2014-03-16 NOTE — BH Assessment (Signed)
Declined [facility] per Darl PikesSusan due to SA.   Clista BernhardtNancy Cassundra Mckeever, [facility] Triage Specialist 03/16/2014 1:03 AM

## 2014-03-16 NOTE — Tx Team (Signed)
Initial Interdisciplinary Treatment Plan   PATIENT STRESSORS: Financial difficulties Medication change or noncompliance Occupational concerns Substance abuse   PATIENT STRENGTHS: Ability for insight General fund of knowledge Supportive family/friends   PROBLEM LIST: Problem List/Patient Goals Date to be addressed Date deferred Reason deferred Estimated date of resolution  "I really dont know maam I am withdrawing right now" 03/16/2014     Suicidal ideations 03/16/2014     depression 03/16/2014     Substance abuse "I have been doing a lot of drugs" 03/16/2014     Anxiety 03/16/2014                              DISCHARGE CRITERIA:  Ability to meet basic life and health needs Adequate post-discharge living arrangements Improved stabilization in mood, thinking, and/or behavior Need for constant or close observation no longer present Safe-care adequate arrangements made Withdrawal symptoms are absent or subacute and managed without 24-hour nursing intervention  PRELIMINARY DISCHARGE PLAN: Attend aftercare/continuing care group Attend 12-step recovery group Placement in alternative living arrangements  PATIENT/FAMIILY INVOLVEMENT: This treatment plan has been presented to and reviewed with the patient, Brandon [name].  The patient and family have been given the opportunity to ask questions and make suggestions.  Angeline SlimHill, Ashley M 03/16/2014, 11:32 PM

## 2014-03-16 NOTE — Progress Notes (Signed)
Patient ID: Brandon SearlesJohn J [name], male   DOB: 10/07/1965, 49 y.o.   MRN: [id number]  [age] t/o male who presents voluntarily for SI, substance abuse, depression, and anxiety.  Patient states he was brought to the ED after he tried to overdose on heroine.  Patient states he has been using drugs for 30 years.  Patient states he was SI with a plan to OD on heroine.  Patient states he has aso been using cocaine daily.  Patient states he is homeless but was at his fathers house at the time, and his dad stopped him.  Patient [facility] and states he went to meeting after but states he went back to using drugs.  Patient states currently states he is SI but verbally contracts for safety.  Patient denies HI/AVH.  Patient skin assessed and patient has a right arm tattoo and scrapes and cuts to bilateral feet.  Patient belongings secured in locker #25 &26.  Consents obtained, fall safety plan explained and patient verbalized understanding.  Food and fluids offered and patient accepted both.  Patient escorted and oriented to the unit.  Patient offered no additional questions or concerns.

## 2014-03-16 NOTE — BH Assessment (Signed)
[facility] Assessment Progress Note  [facility] by Thedore MinsMojeed Akintayo, MD. Per Thurman CoyerEric Kaplan, RN, [facility], pt has been assigned to Rm 404-1, but he is not to be transferred until after 16:30. Pt has signed  Consent to Release Information, and signed form has [facility] along with IVC paperwork. Pt's ED nurse, Minerva AreolaEric, has been informed. He agrees to send original paperwork with pt via [employer], and to call report to [phone].  Doylene Canninghomas Jocee Kissick, MA Triage Specialist 03/16/2014 @ 03/16/2014 @ 15:45

## 2014-03-16 NOTE — Progress Notes (Signed)
  CARE MANAGEMENT ED NOTE 03/16/2014  Patient:  Brandon [name],Brandon [name]   Account Number:  [id number]  Date Initiated:  03/16/2014  Documentation initiated by:  Edd ArbourGIBBS,Sherise Geerdes  Subjective/Objective Assessment:   49 yr [place]  dx major depresssion, recurrent, severe, SI, polysubstance dependence UDS positive for cocaine, and opiates,     Subjective/Objective Assessment Detail:   no pcp no coverage confirmed by pt  4 ED visits - 03/15/14 [facility] , 02/14/14 [facility], 11/23, [facility]  2 admissions in last 6 monts 11/14/13 [facility] 300 bipolar, 10/26/13 [facility] Overdose dx     Action/Plan:   provided self pay resources see note below   Action/Plan Detail:   Anticipated DC Date:       Status Recommendation to Physician:   Result of Recommendation:    Other ED Services  Consult Working Plan    DC Planning Services  PCP issues  Outpatient Services - Pt will follow up  Other    Choice offered to / List presented to:            Status of service:    ED Comments:   ED Comments Detail:  CM spoke with pt who [place]cp. CM discussed and provided written information for self pay pcps, importance of pcp for f/u care, www.needymeds.org, www.goodrx.com, discounted pharmacies and other [place] resources such as [facility], [facility], affordable care act,  financial assistance, DSS and  health department  Reviewed resources for [place] self pay pcps like [facility] [place], family medicine at [address], [facility], general medical clinics, [facility] plus others, medication resources, [facility] out patient pharmacies and housing Pt voiced understanding and appreciation of resources provided  Provided [facility] contact information Pt not interested

## 2014-03-16 NOTE — ED Notes (Signed)
Pt transported to [facility] by [employer] for continuation of specialized care. He left in no acute distress. 

## 2014-03-16 NOTE — Consult Note (Signed)
[facility] Consult   Reason for Consult:  Polysubstance dependence, Suicidal idaetion Referring Physician:  EDP COULSON WEHNER is an 49 y.o. male. Total Time spent with patient: 25 minutes  Assessment: AXIS I:  Major Depression, Recurrent severe and Suicidal Ideation and Polysustance Abuse AXIS II:  Deferred AXIS III:   Past Medical History  Diagnosis Date  . Drug abuse   . Kidney failure   . Bipolar 1 disorder   . Rhabdomyolysis   . Acute renal failure   . Opiate abuse, continuous   . Dental caries     extensive  . Anxiety   . Depression   . Bipolar affective disorder 03/17/2012  . Mood disorder 03/16/2012   AXIS IV:  other psychosocial or environmental problems and problems related to social environment AXIS V:  41-50 serious symptoms  Plan:  Recommend psychiatric Inpatient admission when medically cleared.  Subjective:   ANTOIN DARGIS is a 49 y.o. male patient admitted with mood disorder and substance dependence.  HPI: Kalan Yeley is a 49yo Caucasian male seen today by Dr. Darleene Cleaver, J.Lord, NP and Jannette Spanner, NP. He was brought to [facility] for suicidal ideation with plan to overdose on IV Heroin; states his father stopped him from injecting the heroin. States he has pain all over. He has received detox treatment in the past at [facility].and was last admitted to [facility] hall unit for Polysubstance dependence September, 2015.  Patient stated that he has been using "any drug out there." Patient states he uses alcohol but "not a lot." Reports last used heroin yesterday. States he is homeless; his father lives in the area but states he cannot live with him.  Sees a provider at [facility] for his Anette Guarneri but states he ran out.   Patient reports that he is having withdrawal symptoms, nausea, cramps, anxiety, sweating and restlessness.  Patient is not able to contract for safety.  He continues to endorse suicidal ideation with plan to overdose on heroin. He denies homicidal ideation,  intent or plan at this time but does state "I will hurt somebody if I need money."  HPI Elements:   Location:  Polysubstance Dependence( Heroin, COCAINE, Alcohol), Bipolar disorder, . Quality:  SWEATING, ANXIETY, IRRITABILITY, CRAMS, NAUSEA. Severity:  SEVERE. Timing:  Acute. Duration:  ongoing off and on since age 50. Context:  Seeking treatment for Depression and Bipolar disorder.  Past Psychiatric History: Past Medical History  Diagnosis Date  . Drug abuse   . Kidney failure   . Bipolar 1 disorder   . Rhabdomyolysis   . Acute renal failure   . Opiate abuse, continuous   . Dental caries     extensive  . Anxiety   . Depression   . Bipolar affective disorder 03/17/2012  . Mood disorder 03/16/2012    reports that he has never smoked. He has never used smokeless tobacco. He reports that he uses illicit drugs (Oxycodone, Amphetamines, Barbituates, Cocaine, and Methamphetamines). He reports that he does not drink alcohol. Family History  Problem Relation Age of Onset  . Heart disease Mother   . Heart disease Father    Family History Substance Abuse: Yes, Describe: Family Supports: No Living Arrangements: Other (Comment) (homeless) Can pt return to current living arrangement?: Yes   Allergies:   Allergies  Allergen Reactions  . Other Anaphylaxis and Other (See Comments)    [place], kidney and pork & beans     ACT Assessment Complete:  Yes:    Educational Status  Risk to Self: Risk to self with the past 6 months Suicidal Ideation: Yes-Currently Present Suicidal Intent: Yes-Currently Present Is patient at risk for suicide?: Yes Suicidal Plan?: Yes-Currently Present Specify Current Suicidal Plan: overdose Access to Means: Yes Specify Access to Suicidal Means: illicit drugh What has been your use of drugs/alcohol within the last 12 months?: alcohol, cocaine, opiates, benzo, amphetamines Previous Attempts/Gestures: Yes How many times?: 2 Triggers for Past Attempts: Other  (Comment), Family contact (SA) Intentional Self Injurious Behavior: None Family Suicide History: No Recent stressful life event(s): Conflict (Comment), Loss (Comment), Job Loss, Financial Problems, Legal Issues, Other (Comment) (SA, homelessness) Persecutory voices/beliefs?: No Depression: Yes Depression Symptoms: Despondent, Loss of interest in usual pleasures, Guilt, Feeling angry/irritable Substance abuse history and/or treatment for substance abuse?: Yes  Risk to Others: Risk to Others within the past 6 months Homicidal Ideation: Yes-Currently Present Thoughts of Harm to Others: No Current Homicidal Intent: No Current Homicidal Plan: No Access to Homicidal Means: Yes Describe Access to Homicidal Means: weapons Identified Victim: none History of harm to others?: Yes Assessment of Violence: None Noted Does patient have access to weapons?: Yes (Comment) Criminal Charges Pending?: No  Abuse:    Prior Inpatient Therapy: Prior Inpatient Therapy Prior Inpatient Therapy: Yes Prior Therapy Dates: unknown Prior Therapy Facilty/Provider(s): [facility], [facility], [facility], [facility], [place] Reason for Treatment: SA, SI  Prior Outpatient Therapy: Prior Outpatient Therapy Prior Outpatient Therapy: No  Additional Information: Additional Information 1:1 In Past 12 Months?: No CIRT Risk: No Elopement Risk: No Does patient have medical clearance?: Yes   Objective: Blood pressure 101/58, pulse 86, temperature 98.1 F (36.7 C), temperature source Oral, resp. rate 16, SpO2 99 %.There is no weight on file to calculate BMI. Results for orders placed or performed during the hospital encounter of 03/15/14 (from the past 72 hour(s))  Urine Drug Screen     Status: Abnormal   Collection Time: 03/15/14 10:48 AM  Result Value Ref Range   Opiates POSITIVE (A) NONE DETECTED   Cocaine POSITIVE (A) NONE DETECTED   Benzodiazepines NONE DETECTED NONE DETECTED   Amphetamines NONE DETECTED NONE DETECTED    Tetrahydrocannabinol NONE DETECTED NONE DETECTED   Barbiturates NONE DETECTED NONE DETECTED    Comment:        DRUG SCREEN FOR MEDICAL PURPOSES ONLY.  IF CONFIRMATION IS NEEDED FOR ANY PURPOSE, NOTIFY LAB WITHIN 5 DAYS.        LOWEST DETECTABLE LIMITS FOR URINE DRUG SCREEN Drug Class       Cutoff (ng/mL) Amphetamine      1000 Barbiturate      200 Benzodiazepine   701 Tricyclics       779 Opiates          300 Cocaine          300 THC              50   Acetaminophen level     Status: None   Collection Time: 03/15/14 11:05 AM  Result Value Ref Range   Acetaminophen (Tylenol), Serum 15.6 10 - 30 ug/mL    Comment:        THERAPEUTIC CONCENTRATIONS VARY SIGNIFICANTLY. A RANGE OF 10-30 ug/mL MAY BE AN EFFECTIVE CONCENTRATION FOR MANY PATIENTS. HOWEVER, SOME ARE BEST TREATED AT CONCENTRATIONS OUTSIDE THIS RANGE. ACETAMINOPHEN CONCENTRATIONS >150 ug/mL AT 4 HOURS AFTER INGESTION AND >50 ug/mL AT 12 HOURS AFTER INGESTION ARE OFTEN ASSOCIATED WITH TOXIC REACTIONS.   Ethanol (ETOH)     Status: None  Collection Time: 03/15/14 11:05 AM  Result Value Ref Range   Alcohol, Ethyl (B) <5 0 - 9 mg/dL    Comment:        LOWEST DETECTABLE LIMIT FOR SERUM ALCOHOL IS 11 mg/dL FOR MEDICAL PURPOSES ONLY   Salicylate level     Status: None   Collection Time: 03/15/14 11:05 AM  Result Value Ref Range   Salicylate Lvl <5.7 2.8 - 20.0 mg/dL  CBC     Status: Abnormal   Collection Time: 03/15/14 11:06 AM  Result Value Ref Range   WBC 6.7 4.0 - 10.5 K/uL   RBC 4.30 4.22 - 5.81 MIL/uL   Hemoglobin 13.5 13.0 - 17.0 g/dL   HCT 38.4 (L) 39.0 - 52.0 %   MCV 89.3 78.0 - 100.0 fL   MCH 31.4 26.0 - 34.0 pg   MCHC 35.2 30.0 - 36.0 g/dL   RDW 13.0 11.5 - 15.5 %   Platelets 355 150 - 400 K/uL  Comprehensive metabolic panel     Status: Abnormal   Collection Time: 03/15/14 11:06 AM  Result Value Ref Range   Sodium 137 135 - 145 mmol/L    Comment: Please note change in reference range.    Potassium 4.0 3.5 - 5.1 mmol/L    Comment: Please note change in reference range.   Chloride 103 96 - 112 mEq/L   CO2 23 19 - 32 mmol/L   Glucose, Bld 100 (H) 70 - 99 mg/dL   BUN 16 6 - 23 mg/dL   Creatinine, Ser 1.10 0.50 - 1.35 mg/dL   Calcium 8.7 8.4 - 10.5 mg/dL   Total Protein 7.1 6.0 - 8.3 g/dL   Albumin 4.2 3.5 - 5.2 g/dL   AST 80 (H) 0 - 37 U/L   ALT 34 0 - 53 U/L   Alkaline Phosphatase 52 39 - 117 U/L   Total Bilirubin 0.9 0.3 - 1.2 mg/dL   GFR calc non Af Amer 78 (L) >90 mL/min   GFR calc Af Amer >90 >90 mL/min    Comment: (NOTE) The eGFR has been calculated using the CKD EPI equation. This calculation has not been validated in all clinical situations. eGFR's persistently <90 mL/min signify possible Chronic Kidney Disease.    Anion gap 11 5 - 15   Labs are reviewed and are pertinent for UDS positive for Opiates and Cocaine..  Current Facility-Administered Medications  Medication Dose Route Frequency Provider Last Rate Last Dose  . alum & mag hydroxide-simeth (MAALOX/MYLANTA) 200-200-20 MG/5ML suspension 30 mL  30 mL Oral PRN Linus Mako, PA-C      . carbamazepine (TEGRETOL) tablet 200 mg  200 mg Oral BID PC Janeah Kovacich   200 mg at 03/16/14 1013  . cloNIDine (CATAPRES) tablet 0.1 mg  0.1 mg Oral QID Lailanie Hasley   0.1 mg at 03/16/14 1013   Followed by  . [START ON 03/18/2014] cloNIDine (CATAPRES) tablet 0.1 mg  0.1 mg Oral BH-qamhs Meya Clutter       Followed by  . [START ON 03/20/2014] cloNIDine (CATAPRES) tablet 0.1 mg  0.1 mg Oral QAC breakfast Yaseen Gilberg      . dicyclomine (BENTYL) tablet 20 mg  20 mg Oral Q6H PRN Delfin Gant, NP   20 mg at 03/15/14 1833  . hydrOXYzine (ATARAX/VISTARIL) tablet 25 mg  25 mg Oral Q6H PRN Delfin Gant, NP   25 mg at 03/16/14 0053  . loperamide (IMODIUM) capsule 2-4 mg  2-4 mg Oral  PRN Delfin Gant, NP      . methocarbamol (ROBAXIN) tablet 500 mg  500 mg Oral Q8H PRN Delfin Gant, NP   500 mg  at 03/16/14 0908  . naproxen (NAPROSYN) tablet 500 mg  500 mg Oral BID PRN Delfin Gant, NP   500 mg at 03/16/14 0053  . ondansetron (ZOFRAN) tablet 4 mg  4 mg Oral Q8H PRN Linus Mako, PA-C      . thiamine (VITAMIN B-1) tablet 100 mg  100 mg Oral Daily Linus Mako, PA-C   100 mg at 03/16/14 8502   Or  . thiamine (B-1) injection 100 mg  100 mg Intravenous Daily Tiffany Marilu Favre, PA-C      . traZODone (DESYREL) tablet 50 mg  50 mg Oral QHS PRN,MR X 1 Evanna Cori Burkett, NP   50 mg at 03/16/14 7741   Current Outpatient Prescriptions  Medication Sig Dispense Refill  . diphenhydramine-acetaminophen (TYLENOL PM) 25-500 MG TABS Take 3 tablets by mouth at bedtime as needed.    . lurasidone (LATUDA) 40 MG TABS tablet Take 40 mg by mouth daily.    . clonazePAM (KLONOPIN) 1 MG tablet Take 1 tablet (1 mg total) by mouth every 6 (six) hours as needed (anxiety). (Patient not taking: Reported on 03/15/2014) 28 tablet 0  . diclofenac (VOLTAREN) 75 MG EC tablet Take 75 mg by mouth 2 (two) times daily.    . naproxen (NAPROSYN) 500 MG tablet Take 1 tablet (500 mg total) by mouth 2 (two) times daily with a meal. (Patient not taking: Reported on 03/15/2014) 30 tablet 0  . traMADol (ULTRAM) 50 MG tablet Take 2 tablets (100 mg total) by mouth every 6 (six) hours as needed for severe pain. (Patient not taking: Reported on 03/15/2014) 28 tablet 0  . traMADol (ULTRAM) 50 MG tablet Take 1 tablet (50 mg total) by mouth every 6 (six) hours as needed. (Patient not taking: Reported on 03/15/2014) 15 tablet 0  . traZODone (DESYREL) 50 MG tablet Take 1 tablet (50 mg total) by mouth at bedtime and may repeat dose one time if needed. For sleep (Patient not taking: Reported on 03/15/2014) 60 tablet 0  . ziprasidone (GEODON) 60 MG capsule Take 2 capsules (120 mg total) by mouth at bedtime. For mood control (Patient not taking: Reported on 03/15/2014) 60 capsule 0  . zolpidem (AMBIEN) 10 MG tablet Take 10 mg by mouth at  bedtime as needed for sleep. Repeat in 1 hour if needed      Psychiatric Specialty Exam:     Blood pressure 101/58, pulse 86, temperature 98.1 F (36.7 C), temperature source Oral, resp. rate 16, SpO2 99 %.There is no weight on file to calculate BMI.  General Appearance: Casual and Disheveled  Eye Contact::  None  Speech:  Pressured  Volume:  Normal  Mood:  Anxious, Depressed and Irritable  Affect:  Congruent, Depressed and Full Range  Thought Process:  Coherent, Goal Directed and Intact  Orientation:  Full (Time, Place, and Person)  Thought Content:  suicidal and homicidal  Suicidal Thoughts:  Yes.  with intent/plan  Homicidal Thoughts:  Yes.  with intent/plan  Memory:  Immediate;   Fair Recent;   Poor Remote;   Poor  Judgement:  Poor  Insight:  Shallow  Psychomotor Activity:  Increased, Restlessness and Tremor  Concentration:  Poor  Recall:  NA  Fund of Knowledge:Poor  Language: Fair  Akathisia:  NA  Handed:  Right  AIMS (  if indicated):     Assets:  Desire for Improvement  Sleep:      Musculoskeletal: Strength & Muscle Tone:  WNL  Gait & Station: Unable to assess; patient in bed Patient leans: N/A  Treatment Plan Summary: Daily contact with patient to assess and evaluate symptoms and progress in treatment Medication management Seek placement at any hospital with available bed for Inpatient admission  Start Tegretol for mood stabilization Start Clonidine detox protocol   Serena Colonel, FNP-BC [place] 03/16/2014 1:10 PM  Patient seen, evaluated and I agree with notes by Nurse Practitioner. Corena Pilgrim, MD

## 2014-03-17 DIAGNOSIS — F1190 Opioid use, unspecified, uncomplicated: Secondary | ICD-10-CM

## 2014-03-17 DIAGNOSIS — F1099 Alcohol use, unspecified with unspecified alcohol-induced disorder: Secondary | ICD-10-CM

## 2014-03-17 DIAGNOSIS — F39 Unspecified mood [affective] disorder: Secondary | ICD-10-CM

## 2014-03-17 DIAGNOSIS — F4310 Post-traumatic stress disorder, unspecified: Secondary | ICD-10-CM

## 2014-03-17 MED ORDER — PHENYLEPH-SHARK LIV OIL-MO-PET 0.25-3-14-71.9 % RE OINT
TOPICAL_OINTMENT | Freq: Two times a day (BID) | RECTAL | Status: DC | PRN
  Administered 2014-03-17: 20:00:00 via RECTAL
  Filled 2014-03-17: qty 28.4

## 2014-03-17 MED ORDER — HYDROXYZINE HCL 50 MG PO TABS
50.0000 mg | ORAL_TABLET | Freq: Four times a day (QID) | ORAL | Status: AC | PRN
  Administered 2014-03-17: 50 mg via ORAL
  Filled 2014-03-17 (×2): qty 1

## 2014-03-17 MED ORDER — TRAZODONE HCL 100 MG PO TABS
100.0000 mg | ORAL_TABLET | Freq: Every day | ORAL | Status: DC
  Administered 2014-03-17: 100 mg via ORAL
  Filled 2014-03-17 (×4): qty 1

## 2014-03-17 MED ORDER — TRAMADOL HCL 50 MG PO TABS
50.0000 mg | ORAL_TABLET | Freq: Four times a day (QID) | ORAL | Status: DC | PRN
  Administered 2014-03-17 – 2014-03-21 (×13): 50 mg via ORAL
  Filled 2014-03-17 (×13): qty 1

## 2014-03-17 NOTE — [facility] Group Notes (Signed)
  [facility] LCSW Group Therapy      Feelings About Diagnosis 1:15 - 2:30 PM         03/17/2014 2:35 PM    Type of Therapy:  Group Therapy  Participation Level:  Minimal  Participation Quality:  Appropriate  Affect:  Depressed  Cognitive:  Alert and Appropriate  Insight:  Developing/Improving   Engagement in Therapy:  Developing/Improving  Modes of Intervention:  Discussion, Education, Exploration, Problem-Solving, Rapport Building, Support  Summary of Progress/Problems:  Patient attended group but did not engage in discussion.  [name], Jeryn Bertoni Hairston 03/17/2014  2:35 PM

## 2014-03-17 NOTE — [facility] Suicide Risk Assessment (Signed)
   Nursing information obtained from:  Patient Demographic factors:  Unemployed, Caucasian, Low socioeconomic status Current Mental Status:  Suicidal ideation indicated by patient, Self-harm thoughts Loss Factors:  Financial problems / change in socioeconomic status, Decline in physical health Historical Factors:  Prior suicide attempts, Family history of mental illness or substance abuse Risk Reduction Factors:  Sense of responsibility to family Total Time spent with patient: 45 minutes  CLINICAL FACTORS:  Depression, relapse ( opiates ). Long history of substance dependence, history of Bipolar Disorder   Psychiatric Specialty Exam: Physical Exam  ROS  Blood pressure 103/51, pulse 63, temperature 98 F (36.7 C), temperature source Oral, resp. rate 18, height 6\' 3"  (1.905 m), weight 242 lb (109.77 kg), SpO2 100 %.Body mass index is 30.25 kg/(m^2).  General Appearance: Fairly Groomed  Patent attorneyye Contact::  Good  Speech:  Normal Rate  Volume:  Normal  Mood:  Depressed and Dysphoric  Affect:  Constricted  Thought Process:  Goal Directed and Linear  Orientation:  Other:  fully alert and attentive  Thought Content:  denies current hallucinations, no delusions, not internally preoccupied   Suicidal Thoughts:  No at this time is able to contract for safety and is tolerating medications well without side effects  Homicidal Thoughts:  No  Memory:  recent and remote grossly intact   Judgement:  Fair  Insight:  Fair  Psychomotor Activity:  Normal  Concentration:  Good  Recall:  Good  Fund of Knowledge:Good  Language: Good  Akathisia:  Negative  Handed:  Right  AIMS (if indicated):     Assets:  Desire for Improvement Resilience  Sleep:  Number of Hours: 6.75   Musculoskeletal: Strength & Muscle Tone: within normal limits Gait & Station: walks slowly and painfully due to chronic knee pain Patient leans: N/A  COGNITIVE FEATURES THAT CONTRIBUTE TO RISK:  Closed-mindedness    SUICIDE  RISK:   Moderate:  Frequent suicidal ideation with limited intensity, and duration, some specificity in terms of plans, no associated intent, good self-control, limited dysphoria/symptomatology, some risk factors present, and identifiable protective factors, including available and accessible social support.  PLAN OF CARE: Patient will be admitted to inpatient psychiatric unit for stabilization and safety. Will provide and encourage milieu participation. Provide medication management and maked adjustments as needed.   Will also provide medication management to minimize risk of withdrawal. Will follow daily.   I certify that inpatient services furnished can reasonably be expected to improve the patient's condition.  COBOS, FERNANDO 03/17/2014, 5:48 PM

## 2014-03-17 NOTE — [facility] Group Notes (Signed)
The focus of this group is to educate the patient on the purpose and policies of crisis stabilization and provide a format to answer questions about their admission.  The group details unit policies and expectations of patients while admitted.  Patient attended 0900 nurse education orientation group this morning.  Patient actively participated, appropriate affect, alert, appropriate insight and engagement.  Today patient will work on 3 goals for discharge.  

## 2014-03-17 NOTE — Progress Notes (Signed)
EKG COMPLETED AND PUT ON FRONT OF CHART.

## 2014-03-17 NOTE — Progress Notes (Signed)
Adult Psychoeducational Group Note  Date:  03/17/2014 Time:  9:17 PM  Group Topic/Focus:  Wrap-Up Group:   The focus of this group is to help patients review their daily goal of treatment and discuss progress on daily workbooks.  Participation Level:  Did Not Attend  Additional Comments:  Pt was invited, however stated he had a migraine and his back was hurting and would not attend group.  Caswell CorwinOwen, Aiesha Leland C 03/17/2014, 9:17 PM
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
## 2014-03-17 NOTE — Progress Notes (Signed)
Per patient's request, clothes were removed from patient's lockers.  Security guard present.  Undershorts, socks, shirts removed and brought to patient.    Patient continues to ask for pain medications.  Patient will discuss medications with MD.

## 2014-03-17 NOTE — Clinical Social Work Note (Signed)
Per patient reports, [facility].  Awaiting call back.

## 2014-03-17 NOTE — [facility] Suicide Risk Assessment (Signed)
[facility] INPATIENT:  Family/Significant Other Suicide Prevention Education  Suicide Prevention Education:  Education Completed; Loreen FreudJohn Gallicchio, Father, [phone];  has been identified by the patient as the family member/significant other with whom the patient will be residing, and identified as the person(s) who will aid the patient in the event of a mental health crisis (suicidal ideations/suicide attempt).  With written consent from the patient, the family member/significant other has been provided the following suicide prevention education, prior to the and/or following the discharge of the patient.  The suicide prevention education provided includes the following:  Suicide risk factors  Suicide prevention and interventions  National Suicide Hotline telephone number  [facility] assessment telephone number  [facility] Emergency Assistance 911  [facility] and/or Residential Mobile Crisis Unit telephone number  Request made of family/significant other to:  Remove weapons (e.g., guns, rifles, knives), all items previously/currently identified as safety concern.   Father advised patient does not have access to weapons.     Remove drugs/medications (over-the-counter, prescriptions, illicit drugs), all items previously/currently identified as a safety concern.  The family member/significant other verbalizes understanding of the suicide prevention education information provided.  The family member/significant other agrees to remove the items of safety concern listed above.  Brandon [name], Brandon [name] 03/17/2014, 3:30 PM

## 2014-03-17 NOTE — Progress Notes (Signed)
D:  Patient denied A/V hallucinations.  Patient stated he is always suicidal, but no [facility].  Stated he is always HI, but not to any [facility].  R knee always hurts.  Self inventory sheet, patient stated he slept good last night, sleep medication was helpful.  Good appetite, low energy level, poor concentration.  Rated depression 9, hopeless 8, anxiety 9,  Withdrawals continue, tremors, diarrhea, chilling, cravings, cramping, agitation, nausea, runny nose, irritable, joint pain  SI, contracts for safety.  Physical problems of lightheadedness, pain, dizziness, headaches.  Worst pain #9,  Pain medications not working.  No discharge plans.  No problems anticipated after discharge. A:  Medications administered per MD orders.  Emotional support and encouragement given patient. R:  Safety maintained with 15 minute checks.  Denied A/V hallucinations.  SI and HI, contracts for safety.

## 2014-03-17 NOTE — Plan of Care (Signed)
Problem: Consults Goal: Suicide Risk Patient Education (See Patient Education module for education specifics)  Outcome: Completed/Met Date Met:  03/17/14 Nurse discussed suicidal information with patient.

## 2014-03-17 NOTE — Tx Team (Addendum)
Interdisciplinary Treatment Plan Update   Date Reviewed:  03/17/2014  Time Reviewed:  8:30 AM  Progress in Treatment:   Attending groups: Patient is new to the mileu. Participating in groups: Patient is new to the mileu. Taking medication as prescribed: Yes  Tolerating medication: Yes Family/Significant other contact made:  No, but will ask patient for consent for collateral contact Patient understands diagnosis: Yes  Discussing patient identified problems/goals with staff: Yes Medical problems stabilized or resolved: Yes Denies suicidal/homicidal ideation: No.  Patient endorses SI but able to contract for safety Patient has not harmed self or others: Yes  For review of initial/current patient goals, please see plan of care.  Estimated Length of Stay:  3-5 days  Reasons for Continued Hospitalization:  Anxiety Depression Medication stabilization Suicidal ideation  New Problems/Goals identified:    Discharge Plan or Barriers:   Home with outpatient follow up to be determined  Additional Comments:  Brandon [name] is a 49yo Caucasian male seen today by Dr. Jannifer FranklinAkintayo, J.Lord, NP and Annie MainF. Hobson, NP. [facility]ation with plan to overdose on IV Heroin; states his father stopped him from injecting the heroin. States he has pain all over. He has [facility]and [facility] for Polysubstance dependence September, 2015. Patient stated that he has been using "any drug out there." Patient states he uses alcohol but "not a lot." Reports last used heroin yesterday. States he is homeless; his father lives in the area but states he cannot live with him. [facility] for his [name] but states he ran out. Patient reports that he is having withdrawal symptoms, nausea, cramps, anxiety, sweating and restlessness. Patient is not able to contract for safety. He continues to endorse suicidal ideation with plan to  overdose on heroin. He denies homicidal ideation, intent or plan at this time but does state "I will hurt somebody if I need money."  Patient and CSW reviewed patient's identified goals and treatment plan.  Patient verbalized understanding and agreed to treatment plan.   Attendees:  Patient:  03/17/2014 8:30 AM   Signature:  Sallyanne HaversF. Cobos, MD 03/17/2014 8:30 AM  Signature:  03/17/2014 8:30 AM  Signature:  Earl ManySara Twyman, RN 03/17/2014 8:30 AM  Signature:Beverly Terrilee CroakKnight, RN 03/17/2014 8:30 AM  Signature:   03/17/2014 8:30 AM  Signature:  Juline PatchQuylle Goldie Dimmer, LCSW 03/17/2014 8:30 AM  Signature:  Belenda CruiseKristin Drinkard, LCSW-A 03/17/2014 8:30 AM  Signature:  Leisa LenzValerie Enoch, Care Coordinator [facility] 03/17/2014 8:30 AM  Signature:  Aloha GellKrista Dopson, RN 03/17/2014 8:30 AM  Signature:  03/17/2014  8:30 AM  Signature:   Onnie BoerJennifer Clark, RN [facility] 03/17/2014  8:30 AM  Signature:  Santa GeneraAnne Cunningham Lead Social Worker LCSW 03/17/2014  8:30 AM    Scribe for Treatment Team:   [place] Energy[employer]ylle Mirl Hillery,  03/17/2014 8:30 AM

## 2014-03-18 ENCOUNTER — Other Ambulatory Visit: Payer: Self-pay

## 2014-03-18 DIAGNOSIS — F3130 Bipolar disorder, current episode depressed, mild or moderate severity, unspecified: Secondary | ICD-10-CM

## 2014-03-18 MED ORDER — ZIPRASIDONE HCL 20 MG PO CAPS
20.0000 mg | ORAL_CAPSULE | Freq: Two times a day (BID) | ORAL | Status: DC
  Administered 2014-03-18 – 2014-03-19 (×3): 20 mg via ORAL
  Filled 2014-03-18 (×7): qty 1

## 2014-03-18 MED ORDER — ZOLPIDEM TARTRATE 5 MG PO TABS
5.0000 mg | ORAL_TABLET | Freq: Every evening | ORAL | Status: DC | PRN
  Administered 2014-03-18: 5 mg via ORAL
  Filled 2014-03-18: qty 1

## 2014-03-18 NOTE — Progress Notes (Signed)
[facility] MD Progress Note  03/18/2014 2:51 PM Brandon [name]  MRN:  [id number] Subjective:  Patient reports ongoing symptoms of withdrawal, particularly aches, discomfort. He reports partially improved mood. Denies cravings . Reports chronic pain, only partially alleviated by Ultram PRNS.  Objective: I have discussed case with treatment team and have met with patient. Patient reports some ongoing depression and anxiety, but denies any suicidal ideations. He has been visible on unit and has been going to groups. No disruptive or agitated behaviors. Denies medication side effects. Continues to report chronic knee pain, only partially alleviated by Ultram.  As noted, patient has a history of Bipolar Disorder, which he states is worsened by drug use but independent from it. He does not like " the way the Tegretol is making me feel". He does not describe any clear side effects, however. He does state he has been on Geodon in the past without side effects and is hoping to switch to the latter.  Principal Problem:  Polysubstance Dependence ( Opiate most recent /major drug being used ) Diagnosis:   Patient Active Problem List   Diagnosis Date Noted  . Bipolar depression [F31.30] 03/16/2014  . Bipolar disorder, current episode depressed, severe, without psychotic features [F31.4]   . Polysubstance abuse [F19.10]   . Bipolar disorder [F31.9] 11/05/2013  . 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 Acute meniscal tear of right knee [S83.206A] 11/04/2013  . Dehydration [E86.0] 10/27/2013  . Rhabdomyolysis [M62.82] 10/27/2013  . Polysubstance dependence [F19.20] 11/06/2012  . Overdose by amphetamine [T73.220U] 11/02/2012  . Overdose [T50.901A] 10/14/2012  . Bipolar affective disorder [F31.9] 03/17/2012    Class: Chronic  . Cocaine abuse [F14.10] 03/17/2012    Class: Acute  . Mood disorder [F39] 03/16/2012  . Normocytic anemia [D64.9] 03/15/2012  . Suicidal ideation [R45.851] 03/12/2012  . Substance  abuse [F19.10] 03/12/2012  . ANEMIA NOS [D64.9] 03/27/2006  . OPIOID DEPENDENCE [F11.20] 03/27/2006  . COCAINE ABUSE [F14.10] 03/27/2006  . TRANSAMINASES, SERUM, ELEVATED [R74.0] 03/27/2006  . GASTROPARESIS [K31.84] 02/16/2006  . KNEE PAIN, RIGHT, CHRONIC [M25.569] 02/16/2006   Total Time spent with patient: 25 minutes    Past Medical History:  Past Medical History  Diagnosis Date  . Drug abuse   . Kidney failure   . Bipolar 1 disorder   . Rhabdomyolysis   . Acute renal failure   . Opiate abuse, continuous   . Dental caries     extensive  . Anxiety   . Depression   . Bipolar affective disorder 03/17/2012  . Mood disorder 03/16/2012    Past Surgical History  Procedure Laterality Date  . Knee surgery      x 5   Family History:  Family History  Problem Relation Age of Onset  . Heart disease Mother   . Heart disease Father    Social History:  History  Alcohol Use No     History  Drug Use  . Yes  . Special: Oxycodone, Amphetamines, Barbituates, Cocaine, Methamphetamines    Comment: admits to snorting percocet today    History   Social History  . Marital Status: Divorced    Spouse Name: N/A    Number of Children: N/A  . Years of Education: N/A   Social History Main Topics  . Smoking status: Never Smoker   . Smokeless tobacco: Never Used  . Alcohol Use: No  . Drug Use: Yes    Special: Oxycodone, Amphetamines, Barbituates, Cocaine, Methamphetamines     Comment: admits to snorting percocet  today  . Sexual Activity: Yes    Birth Control/ Protection: None   Other Topics Concern  . None   Social History Narrative   ** Merged History Encounter **       Additional History:    Sleep: Fair  Appetite:  Good   Assessment:   Musculoskeletal: Strength & Muscle Tone: within normal limits Gait & Station: slow but steady gait, related to chronic knee pain Patient leans: N/A   Psychiatric Specialty Exam: Physical Exam  ROS no chest  Pain, no shortness  of breath, no coughing, no vomiting, no fever or chills. (+)  Aches, (+) knee pain.  Blood pressure 120/79, pulse 92, temperature 97.6 F (36.4 C), temperature source Oral, resp. rate 18, height 6' 3"  (1.905 m), weight 242 lb (109.77 kg), SpO2 100 %.Body mass index is 30.25 kg/(m^2).  General Appearance: Fairly Groomed  Engineer, water::  Good  Speech:  Normal Rate  Volume:  Normal  Mood:  Dysphoric- but improved compared to admission  Affect:  Congruent- does smile at times appropriately  Thought Process:  Goal Directed and Linear  Orientation:  Full (Time, Place, and Person)  Thought Content:  denies hallucinations, no delusions  Suicidal Thoughts:  No- at this time denies plan or intention of hurting self or anyone else   Homicidal Thoughts:  No  Memory:  Recent and remote grossly intact   Judgement:  Fair  Insight:  Fair  Psychomotor Activity:  Normal- no agitation or psychomotor restlessness   Concentration:  Good  Recall:  Good  Fund of Knowledge:Good  Language: Good  Akathisia:  Negative  Handed:  Right  AIMS (if indicated):     Assets:  Desire for Improvement Resilience  ADL's: fair   Cognition: WNL  Sleep:  Number of Hours: 4     Current Medications: Current Facility-Administered Medications  Medication Dose Route Frequency Provider Last Rate Last Dose  . acetaminophen (TYLENOL) tablet 650 mg  650 mg Oral Q6H PRN Lurena Nida, NP   650 mg at 03/17/14 1613  . alum & mag hydroxide-simeth (MAALOX/MYLANTA) 200-200-20 MG/5ML suspension 30 mL  30 mL Oral PRN Lurena Nida, NP      . carbamazepine (TEGRETOL) tablet 200 mg  200 mg Oral BID PC Lurena Nida, NP   200 mg at 03/18/14 0804  . cloNIDine (CATAPRES) tablet 0.1 mg  0.1 mg Oral BH-qamhs Lurena Nida, NP   0.1 mg at 03/18/14 0758   Followed by  . [START ON 03/20/2014] cloNIDine (CATAPRES) tablet 0.1 mg  0.1 mg Oral QAC breakfast Lurena Nida, NP      . dicyclomine (BENTYL) tablet 20 mg  20 mg Oral Q6H PRN Lurena Nida, NP      . hydrOXYzine (ATARAX/VISTARIL) tablet 50 mg  50 mg Oral Q6H PRN Lurena Nida, NP   50 mg at 03/17/14 2114  . loperamide (IMODIUM) capsule 2-4 mg  2-4 mg Oral PRN Lurena Nida, NP      . magnesium hydroxide (MILK OF MAGNESIA) suspension 30 mL  30 mL Oral Daily PRN Lurena Nida, NP      . methocarbamol (ROBAXIN) tablet 500 mg  500 mg Oral Q8H PRN Lurena Nida, NP   500 mg at 03/18/14 1051  . naproxen (NAPROSYN) tablet 500 mg  500 mg Oral BID PRN Lurena Nida, NP   500 mg at 03/18/14 0100  . ondansetron (ZOFRAN) tablet 4 mg  4 mg  Oral Q8H PRN Lurena Nida, NP      . phenylephrine-shark liver oil-mineral oil-petrolatum (PREPARATION H) rectal ointment   Rectal BID PRN Encarnacion Slates, NP      . thiamine (VITAMIN B-1) tablet 100 mg  100 mg Oral Daily Lurena Nida, NP   100 mg at 03/18/14 1427   Or  . thiamine (B-1) injection 100 mg  100 mg Intravenous Daily Lurena Nida, NP      . traMADol Veatrice Bourbon) tablet 50 mg  50 mg Oral Q6H PRN Lurena Nida, NP   50 mg at 03/18/14 0803  . traZODone (DESYREL) tablet 100 mg  100 mg Oral QHS Lurena Nida, NP   100 mg at 03/17/14 2114    Lab Results: No results found for this or any previous visit (from the past 48 hour(s)).  Physical Findings: AIMS: Facial and Oral Movements Muscles of Facial Expression: None, normal Lips and Perioral Area: None, normal Jaw: None, normal Tongue: None, normal,Extremity Movements Upper (arms, wrists, hands, fingers): None, normal Lower (legs, knees, ankles, toes): None, normal, Trunk Movements Neck, shoulders, hips: None, normal, Overall Severity Severity of abnormal movements (highest score from questions above): None, normal Incapacitation due to abnormal movements: None, normal Patient's awareness of abnormal movements (rate only patient's report): No Awareness, Dental Status Current problems with teeth and/or dentures?: Yes Does patient usually wear dentures?: No  CIWA:  CIWA-Ar Total: 1 COWS:   COWS Total Score: 1   Assessment: Patient improved, remains dysphoric but range of affect is improved. Overall mild symptoms of opiate withdrawal, and does not appear to be in any acute distress , vitals are stable. He has a history of Bipolar Disorder, and states he prefers Geodon, which he has been on before without side effects, rather than Tegretol. Also states that trazodone not working, wants it D/C d.   Treatment Plan Summary: Daily contact with patient to assess and evaluate symptoms and progress in treatment, Medication management, Plan continue inpatient treatment  and continue medications as below  D/C Tegretol Start Geodon 20 mgrs BID D/C Trazodone Start Ambien 5 mgrs QHS PRN Insomnia   Medical Decision Making:  Established Problem, Stable/Improving (1), Review of Medication Regimen & Side Effects (2) and Review of New Medication or Change in Dosage (2) Problem Points:  Established problem, stable/improving (1), Review of last therapy session (1) and Review of psycho-social stressors (1) Data Points:  Review of medication regiment & side effects (2) Review of new medications or change in dosage (2)    COBOS, FERNANDO 03/18/2014, 2:51 PM

## 2014-03-18 NOTE — Progress Notes (Signed)
EKG COMPLETED PER MD ORDER.  PAPER WOULD NOT PRINT BUT COMPUTER SCREEN STATED INFORMATION WAS TRANSMITTED.  CHECK EPIC FOR RESULTS.

## 2014-03-18 NOTE — [facility] Group Notes (Signed)
.  [facility] LCSW Group Therapy  Emotional Regulation 1:15 - 2: 30 PM        03/18/2014     Type of Therapy:  Group Therapy  Participation Level:  Minimal  Participation Quality:  Appropriate  Affect:  Depressed  Cognitive:  Attentive   Insight:  Developing/Improving   Engagement in Therapy:  Developing/Improving   Modes of Intervention:  Discussion Exploration Problem-Solving Supportive  Summary of Progress/Problems:  Group topic was emotional regulations.  Patient was in group but did not engage in the discussion. [name], Khalila Buechner Hairston 03/18/2014

## 2014-03-18 NOTE — [facility] Group Notes (Signed)
[facility]   03/18/2014 11:22 AM    Participation Quality:  Appropraite  Mood/Affect:  Appropriate  Depression Rating:  7/8  Anxiety Rating:  7/8  Thoughts of Suicide:  No  Will you contract for safety?   NA  Current AVH:  No  Plan for Discharge/Comments:  Patient attended discharge planning group and actively participated in group. He advised of not sleeping well last night.  Patient informed [facility] and that we are awaiting call back regarding acceptance for treatment.Suicide prevention education reviewed and SPE document provided.   Transportation Means: Patient has transportation.   Supports:  Patient has a limited support system.   [name], Brandon [name] 03/18/2014  11:22 AM

## 2014-03-18 NOTE — Progress Notes (Signed)
Pt did not attend group this evening.  

## 2014-03-18 NOTE — Progress Notes (Signed)
D:  Patient's self inventory sheet, patient has poor sleep, sleep medication is not helpful.  Fair appetite, low energy level,k poor concentration.  Rated depression, hopeless and anxiety #8.  Withdrawals continue, tremors, chilling, cravings, cramping, agitation, nausea, irritability.  Denied SI.  Physical pain of lightheadedness, pain, dizziness, headache, joint pain.  Worst pain 9-10, knee/back.  Pain medication is not helpful.  "CAT sleep, in a lot of pain."  Does have discharge plan.  No problems anticipated after discharge. A:  Medications administered per MD orders.  Emotional support and encouragement given patient. R:  Denied SI and HI, contracts for safety.  Denied A/V hallucinations.  Safety maintained with 15 minute checks.

## 2014-03-18 NOTE — Plan of Care (Signed)
Problem: Consults Goal: Anxiety Disorder Patient Education See Patient Education Module for eduction specifics.  Outcome: Completed/Met Date Met:  03/18/14 Nurse discussed anxiety with patient.

## 2014-03-19 MED ORDER — ZIPRASIDONE HCL 40 MG PO CAPS
40.0000 mg | ORAL_CAPSULE | Freq: Two times a day (BID) | ORAL | Status: DC
  Administered 2014-03-20 – 2014-03-21 (×3): 40 mg via ORAL
  Filled 2014-03-19 (×7): qty 1

## 2014-03-19 MED ORDER — ZOLPIDEM TARTRATE 10 MG PO TABS
10.0000 mg | ORAL_TABLET | Freq: Every evening | ORAL | Status: DC | PRN
  Administered 2014-03-20 (×2): 10 mg via ORAL
  Filled 2014-03-19 (×3): qty 1

## 2014-03-19 NOTE — Progress Notes (Signed)
Patient ID: Brandon SearlesJohn J [name], male   DOB: 08/17/1965, 49 y.o.   MRN: [id number]  Adult Psychoeducational Group Note  Date:  03/19/2014 Time: 0905  Group Topic/Focus:  Goals Group:   The focus of this group is to help patients establish daily goals to achieve during treatment and discuss how the patient can incorporate goal setting into their daily lives to aide in recovery.  Participation Level:  Did Not Attend  Participation Quality: n/a  Affect: n/a  Cognitive: n/a  Insight: n/a  Engagement in Group: n/a   Modes of Intervention:  Discussion, Education, Orientation and Support  Additional Comments:  Pt did not attend. Pt in bed asleep.   Aurora Maskwyman, Shadman Tozzi E 03/19/2014, 11:42 AM

## 2014-03-19 NOTE — Clinical Social Work Note (Signed)
[facility] [place] [facility] Trey PaulaJeff advised he would call back.  CSW reached out to [place]Jeff several times throughout the day but unable to make contact.

## 2014-03-19 NOTE — Progress Notes (Signed)
Recreation Therapy Notes  Animal-Assisted Activity/Therapy (AAA/T) Program Checklist/Progress Notes Patient Eligibility Criteria Checklist & Daily Group note for Rec Tx Intervention  Date: 01.21.2016 Time: 2:45pm [facility] [address] [facility]    AAA/T Program Assumption of Risk Form signed by Patient/ or Parent Legal Guardian yes  Patient is free of allergies or sever asthma yes  Patient reports no fear of animals yes  Patient reports no history of cruelty to animals yes  Patient understands his/her participation is voluntary yes  Patient washes hands before animal contact yes  Patient washes hands after animal contact yes  Behavioral Response: Appropriate   Education: Hand Washing, Appropriate Animal Interaction   Education Outcome: Acknowledges education.   Clinical Observations/Feedback: Patient actively engaged with therapy dog, petting him appropriately and feeding him a treat in exchange for completion of basic obedience commands.   Brandon [name], LRT/CTRS         [name] 03/19/2014 5:11 PM

## 2014-03-19 NOTE — Progress Notes (Addendum)
Patient ID: Brandon [name], male   DOB: [birth year], 49 y.o.   MRN: [id number] [facility] MD Progress Note  03/19/2014 7:26 PM Brandon [name]  MRN:  [id number] Subjective:   Patient states he is feeling " a little better". He does report ongoing knee pain, which he states is only temporally relieved by ultram. Complains of insomnia He denies medication side effects.  He is more future oriented at this time and is focused on being able to remain in the hospital until a bed becomes available at a Rehab to minimize risk of relapse.  Objective: I have discussed case with treatment team and have met with patient. Patient 's mood presents improved, with a fuller range of affect. He has been participating in milieu and  going to groups.  No disruptive or agitated behaviors. He denies medication side effects and reports they " seem to be working a little". He states " I like the Geodon, but the dose is too small" Focused on pain management issues. At this time presents calm, and does not appear uncomfortable or agitated related to pain. No current severe opiate withdrawal symptoms- no vomiting, endorses  mild cramps, mild loose stools, disrupted sleep.    Principal Problem:  Polysubstance Dependence ( Opiate most recent /major drug being used ) Diagnosis:   Patient Active Problem List   Diagnosis Date Noted  . Bipolar depression [F31.30] 03/16/2014  . Bipolar disorder, current episode depressed, severe, without psychotic features [F31.4]   . Polysubstance abuse [F19.10]   . Bipolar disorder [F31.9] 11/05/2013  . 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 Acute meniscal tear of right knee [S83.206A] 11/04/2013  . Dehydration [E86.0] 10/27/2013  . Rhabdomyolysis [M62.82] 10/27/2013  . Polysubstance dependence [F19.20] 11/06/2012  . Overdose by amphetamine [Q98.264B] 11/02/2012  . Overdose [T50.901A] 10/14/2012  . Bipolar affective disorder [F31.9] 03/17/2012    Class: Chronic  . Cocaine abuse [F14.10]  03/17/2012    Class: Acute  . Mood disorder [F39] 03/16/2012  . Normocytic anemia [D64.9] 03/15/2012  . Suicidal ideation [R45.851] 03/12/2012  . Substance abuse [F19.10] 03/12/2012  . ANEMIA NOS [D64.9] 03/27/2006  . OPIOID DEPENDENCE [F11.20] 03/27/2006  . COCAINE ABUSE [F14.10] 03/27/2006  . TRANSAMINASES, SERUM, ELEVATED [R74.0] 03/27/2006  . GASTROPARESIS [K31.84] 02/16/2006  . KNEE PAIN, RIGHT, CHRONIC [M25.569] 02/16/2006   Total Time spent with patient: 25 minutes    Past Medical History:  Past Medical History  Diagnosis Date  . Drug abuse   . Kidney failure   . Bipolar 1 disorder   . Rhabdomyolysis   . Acute renal failure   . Opiate abuse, continuous   . Dental caries     extensive  . Anxiety   . Depression   . Bipolar affective disorder 03/17/2012  . Mood disorder 03/16/2012    Past Surgical History  Procedure Laterality Date  . Knee surgery      x 5   Family History:  Family History  Problem Relation Age of Onset  . Heart disease Mother   . Heart disease Father    Social History:  History  Alcohol Use No     History  Drug Use  . Yes  . Special: Oxycodone, Amphetamines, Barbituates, Cocaine, Methamphetamines    Comment: admits to snorting percocet today    History   Social History  . Marital Status: Divorced    Spouse Name: N/A    Number of Children: N/A  . Years of Education: N/A   Social History Main Topics  .  Smoking status: Never Smoker   . Smokeless tobacco: Never Used  . Alcohol Use: No  . Drug Use: Yes    Special: Oxycodone, Amphetamines, Barbituates, Cocaine, Methamphetamines     Comment: admits to snorting percocet today  . Sexual Activity: Yes    Birth Control/ Protection: None   Other Topics Concern  . None   Social History Narrative   ** Merged History Encounter **       Additional History:    Sleep: Fair  Appetite:  Good   Assessment:   Musculoskeletal: Strength & Muscle Tone: within normal limits Gait &  Station: slow but steady gait, related to chronic knee pain Patient leans: N/A   Psychiatric Specialty Exam: Physical Exam  ROS no chest  Pain, no shortness of breath, no coughing, no vomiting, no fever or chills. (+)  Aches, (+) knee pain.  Blood pressure 119/83, pulse 110, temperature 97.3 F (36.3 C), temperature source Oral, resp. rate 16, height _0  (1.905 m), weight 242 lb (109.77 kg), SpO2 100 %.Body mass index is 30.25 kg/(m^2).  General Appearance: improved grooming  Eye Contact::  Good  Speech:  Normal Rate  Volume:  Normal  Mood:  improved mood and improved range of affect-   Affect:  Congruent and reactive-   Thought Process:  Goal Directed and Linear  Orientation:  Full (Time, Place, and Person)  Thought Content:  denies hallucinations, no delusions  Suicidal Thoughts:  No- at this time denies plan or intention of hurting self or anyone else   Homicidal Thoughts:  No  Memory:  Recent and remote grossly intact   Judgement:  Fair  Insight:  Fair  Psychomotor Activity:  Normal- no agitation or psychomotor restlessness   Concentration:  Good  Recall:  Good  Fund of Knowledge:Good  Language: Good  Akathisia:  Negative  Handed:  Right  AIMS (if indicated):     Assets:  Desire for Improvement Resilience  ADL's: fair   Cognition: WNL  Sleep:  Number of Hours: 6.5     Current Medications: Current Facility-Administered Medications  Medication Dose Route Frequency Provider Last Rate Last Dose  . acetaminophen (TYLENOL) tablet 650 mg  650 mg Oral Q6H PRN Lurena Nida, NP   650 mg at 03/19/14 1656  . alum & mag hydroxide-simeth (MAALOX/MYLANTA) 200-200-20 MG/5ML suspension 30 mL  30 mL Oral PRN Lurena Nida, NP      . cloNIDine (CATAPRES) tablet 0.1 mg  0.1 mg Oral BH-qamhs Lurena Nida, NP   0.1 mg at 03/19/14 0827   Followed by  . [START ON 03/20/2014] cloNIDine (CATAPRES) tablet 0.1 mg  0.1 mg Oral QAC breakfast Lurena Nida, NP      . dicyclomine (BENTYL)  tablet 20 mg  20 mg Oral Q6H PRN Lurena Nida, NP      . hydrOXYzine (ATARAX/VISTARIL) tablet 50 mg  50 mg Oral Q6H PRN Lurena Nida, NP   50 mg at 03/17/14 2114  . loperamide (IMODIUM) capsule 2-4 mg  2-4 mg Oral PRN Lurena Nida, NP      . magnesium hydroxide (MILK OF MAGNESIA) suspension 30 mL  30 mL Oral Daily PRN Lurena Nida, NP      . methocarbamol (ROBAXIN) tablet 500 mg  500 mg Oral Q8H PRN Lurena Nida, NP   500 mg at 03/19/14 1655  . naproxen (NAPROSYN) tablet 500 mg  500 mg Oral BID PRN Lurena Nida, NP  500 mg at 03/19/14 1311  . ondansetron (ZOFRAN) tablet 4 mg  4 mg Oral Q8H PRN Lurena Nida, NP      . phenylephrine-shark liver oil-mineral oil-petrolatum (PREPARATION H) rectal ointment   Rectal BID PRN Encarnacion Slates, NP      . thiamine (VITAMIN B-1) tablet 100 mg  100 mg Oral Daily Lurena Nida, NP   100 mg at 03/19/14 4818   Or  . thiamine (B-1) injection 100 mg  100 mg Intravenous Daily Lurena Nida, NP      . traMADol Veatrice Bourbon) tablet 50 mg  50 mg Oral Q6H PRN Lurena Nida, NP   50 mg at 03/19/14 1311  . ziprasidone (GEODON) capsule 20 mg  20 mg Oral BID WC Myer Peer Cobos, MD   20 mg at 03/19/14 1653  . zolpidem (AMBIEN) tablet 5 mg  5 mg Oral QHS PRN Jenne Campus, MD   5 mg at 03/18/14 2106    Lab Results: No results found for this or any previous visit (from the past 20 hour(s)).  Physical Findings: AIMS: Facial and Oral Movements Muscles of Facial Expression: None, normal Lips and Perioral Area: None, normal Jaw: None, normal Tongue: None, normal,Extremity Movements Upper (arms, wrists, hands, fingers): None, normal Lower (legs, knees, ankles, toes): None, normal, Trunk Movements Neck, shoulders, hips: None, normal, Overall Severity Severity of abnormal movements (highest score from questions above): None, normal Incapacitation due to abnormal movements: None, normal Patient's awareness of abnormal movements (rate only patient's report): No Awareness,  Dental Status Current problems with teeth and/or dentures?: Yes Does patient usually wear dentures?: No  CIWA:  CIWA-Ar Total: 1 COWS:  COWS Total Score: 2   Assessment: Patient  Improving gradually. Mood and affect improved. Tolerating Geodon well thus far. Patient interested in going to an inpatient rehab program upon discharge   Treatment Plan Summary: Daily contact with patient to assess and evaluate symptoms and progress in treatment, Medication management, Plan continue inpatient treatment  and continue medications as below   Increase Geodon to 40 mgrs BID Increase Ambien 10  mgrs QHS PRN Insomnia Completing opiate detox protocol ( clonidine) .    Medical Decision Making:  Established Problem, Stable/Improving (1), Review of Medication Regimen & Side Effects (2) and Review of New Medication or Change in Dosage (2) Problem Points:  Established problem, stable/improving (1), Review of last therapy session (1) and Review of psycho-social stressors (1) Data Points:  Review of medication regiment & side effects (2) Review of new medications or change in dosage (2)    COBOS, FERNANDO 03/19/2014, 7:26 PM

## 2014-03-19 NOTE — Progress Notes (Signed)
D: Patient appears sad and depressed. Cooperative with assessment. Pt rated pain as 8 on 0-10 scale. Pt thought process is organized and behavior is appropriate. Denies SI/HI/AV and contract to come to staff if feeling unsafe.  A: Safety has been maintained with Q15 minutes observation. Supported and encouragement provided.  R: Patient remains safe. He is complaint with medication and group programming.

## 2014-03-19 NOTE — [facility] Group Notes (Signed)
[facility] LCSW Group Therapy  [facility] 1:15 - 2:30 PM  03/19/2014 4:00 PM  Type of Therapy:  Group Therapy  Participation Level:  Did Not Attend - patient in bed.  [name], Brandon [name] 03/19/2014, 4:00 PM

## 2014-03-19 NOTE — Progress Notes (Signed)
Patient ID: Brandon SearlesJohn J Pittsley, male   DOB: 03/03/1965, 49 y.o.   MRN: [id number]  Pt suspected to be engaging in substance abuse activities while on the unit. [facility]. Pt consulted and pt denied any illegal activity. Environmental search conducted. No contraband found. Provider order for 24 hour visitation started at 1130 on 03/19/2014.

## 2014-03-19 NOTE — Plan of Care (Signed)
Problem: Diagnosis: Increased Risk For Suicide Attempt Goal: STG-Patient Will Comply With Medication Regime Outcome: Progressing Pt is safe and complaint with medication regimen.

## 2014-03-19 NOTE — Clinical Social Work Note (Signed)
CSW contact [facility] in [place] as requested by patient.  Patient's name was not released.  CSW advised they do not have an opening and there is a very extensive waiting list.  Patient advised [facility] is the only program he would be willing to discharge to outside of [place] area.  He advised of he does not want to go to [facility]  Because of drugs but has no place he can live at discharge.  He called his father early while CSW at desk.  He advised father will not allow him to return to the home.

## 2014-03-19 NOTE — Progress Notes (Signed)
D:Patient in bed on approach.  Patient does arouse to name call but he is drowsy.  Patient did engage in minimal conversation with Clinical research associatewriter while he was awake.  Patient states he has been feeling ok.  Patient states his goal for today was, "to make it through the day.  Patient denies SI/HI and denies AVH.  Patient stated he would be needed Ambien tonight to sleep.  Patient did not come back for sleep medications because he is already sleeping soundly.   A: Staff to monitor Q 15 mins for safety.  Encouragement and support offered.  No Scheduled medications administered per orders.  Patient schedule clonidine was not administered due to a heart rate of 60.    R: Patient remains safe on the unit.  Patient did not attend group tonight.  Patient not visible on the unit tonight.  Patient had not medications to administer.

## 2014-03-20 MED ORDER — HYDROXYZINE HCL 50 MG PO TABS
50.0000 mg | ORAL_TABLET | Freq: Four times a day (QID) | ORAL | Status: DC | PRN
  Administered 2014-03-20: 50 mg via ORAL

## 2014-03-20 NOTE — Progress Notes (Signed)
D: Patient is A&Ox4, denies SI/HI, A/V hallucination, states he feels good. Patient does state he has a headache as well as multiple pain sites. Also spoke about getting his dad to drop him off some money because he was paid today. Patient did attend group this morning.    A: Medications given as scheduled and prn. Emotional support given prn. Continue q15 minute safety checks. R: Patient remains safe and states he will let staff know if he feels like hurting himself or anyone else.   Cyanne Delmar, Wyman SongsterAngela Marie

## 2014-03-20 NOTE — [facility] Group Notes (Signed)
[facility] Note   03/20/2014 10:12 AM    Participation Quality:  Appropraite  Mood/Affect:  Appropriate  Depression Rating:  8  Anxiety Rating:  8  Thoughts of Suicide:  No  Will you contract for safety?   NA  Current AVH:  No  Plan for Discharge/Comments:  Patient attended discharge planning group and actively participated in group. Patient is [facility] to [facility].  Suicide prevention education reviewed and SPE document provided.   Transportation Means: Patient has transportation.   Supports:  Patient has a limited support system.   Brandon [name], Joesph JulyQuylle Hairston

## 2014-03-20 NOTE — Plan of Care (Signed)
Problem: Alteration in mood Goal: LTG-Patient reports reduction in suicidal thoughts (Patient reports reduction in suicidal thoughts and is able to verbalize a safety plan for whenever patient is feeling suicidal)  Outcome: Progressing Patient denies SI/HI and denies AVH.  Patient verbally contracts for safety.

## 2014-03-20 NOTE — Progress Notes (Signed)
Patient ID: Brandon [name], male   DOB: 21-Jun-1965, 49 y.o.   MRN: [id number] [facility] MD Progress Note  03/20/2014 12:41 PM Brandon [name]  MRN:  [id number] Subjective:  Patient reports overall improvement. Reports he is hoping to go to [facility] after discharge is possible. States he feels medications are helping , denies side effects except for mild headache. Describes some lingering opiate withdrawal symptoms- some diffuse aches, loose stools, but no vomiting or severe symptoms. Objective: I have discussed case with treatment team and have met with patient. Patient has been visible in day room and has been attending groups. He is tolerating medications well, except for mild headache. States he feels Geodon is helpful in stabilizing his mood. He is future oriented, and focused on going to an inpatient rehab setting after discharge. No disruptive behaviors on unit. Going to groups. Mood and affect are improved.    Principal Problem:  Polysubstance Dependence ( Opiate most recent /major drug being used ) Diagnosis:   Patient Active Problem List   Diagnosis Date Noted  . Bipolar depression [F31.30] 03/16/2014  . Bipolar disorder, current episode depressed, severe, without psychotic features [F31.4]   . Polysubstance abuse [F19.10]   . Bipolar disorder [F31.9] 11/05/2013  . 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 Acute meniscal tear of right knee [S83.206A] 11/04/2013  . Dehydration [E86.0] 10/27/2013  . Rhabdomyolysis [M62.82] 10/27/2013  . Polysubstance dependence [F19.20] 11/06/2012  . Overdose by amphetamine [T55.732K] 11/02/2012  . Overdose [T50.901A] 10/14/2012  . Bipolar affective disorder [F31.9] 03/17/2012    Class: Chronic  . Cocaine abuse [F14.10] 03/17/2012    Class: Acute  . Mood disorder [F39] 03/16/2012  . Normocytic anemia [D64.9] 03/15/2012  . Suicidal ideation [R45.851] 03/12/2012  . Substance abuse [F19.10] 03/12/2012  . ANEMIA NOS [D64.9] 03/27/2006  . OPIOID DEPENDENCE  [F11.20] 03/27/2006  . COCAINE ABUSE [F14.10] 03/27/2006  . TRANSAMINASES, SERUM, ELEVATED [R74.0] 03/27/2006  . GASTROPARESIS [K31.84] 02/16/2006  . KNEE PAIN, RIGHT, CHRONIC [M25.569] 02/16/2006   Total Time spent with patient: 25 minutes    Past Medical History:  Past Medical History  Diagnosis Date  . Drug abuse   . Kidney failure   . Bipolar 1 disorder   . Rhabdomyolysis   . Acute renal failure   . Opiate abuse, continuous   . Dental caries     extensive  . Anxiety   . Depression   . Bipolar affective disorder 03/17/2012  . Mood disorder 03/16/2012    Past Surgical History  Procedure Laterality Date  . Knee surgery      x 5   Family History:  Family History  Problem Relation Age of Onset  . Heart disease Mother   . Heart disease Father    Social History:  History  Alcohol Use No     History  Drug Use  . Yes  . Special: Oxycodone, Amphetamines, Barbituates, Cocaine, Methamphetamines    Comment: admits to snorting percocet today    History   Social History  . Marital Status: Divorced    Spouse Name: N/A    Number of Children: N/A  . Years of Education: N/A   Social History Main Topics  . Smoking status: Never Smoker   . Smokeless tobacco: Never Used  . Alcohol Use: No  . Drug Use: Yes    Special: Oxycodone, Amphetamines, Barbituates, Cocaine, Methamphetamines     Comment: admits to snorting percocet today  . Sexual Activity: Yes    Birth Control/ Protection: None  Other Topics Concern  . None   Social History Narrative   ** Merged History Encounter **       Additional History:    Sleep:  Good   Appetite:  Good   Assessment:   Musculoskeletal: Strength & Muscle Tone: within normal limits Gait & Station: slow but steady gait, related to chronic knee pain Patient leans: N/A   Psychiatric Specialty Exam: Physical Exam  ROS no chest  Pain, no shortness of breath, no coughing, no vomiting, no fever or chills. (+)  Aches, (+) knee  pain.  Blood pressure 133/73, pulse 73, temperature 97.4 F (36.3 C), temperature source Oral, resp. rate 16, height 6' 3"  (1.905 m), weight 242 lb (109.77 kg), SpO2 100 %.Body mass index is 30.25 kg/(m^2).  General Appearance: improved grooming  Eye Contact::  Good  Speech:  Normal Rate  Volume:  Normal  Mood:  improved mood and improved range of affect-   Affect:  Appropriate and Full Range-   Thought Process:  Goal Directed and Linear  Orientation:  Full (Time, Place, and Person)  Thought Content:  denies hallucinations, no delusions  Suicidal Thoughts:  No- at this time denies plan or intention of hurting self or anyone else   Homicidal Thoughts:  No  Memory:  Recent and remote grossly intact   Judgement:  Fair  Insight:  Fair  Psychomotor Activity:  Normal  Concentration:  Good  Recall:  Good  Fund of Knowledge:Good  Language: Good  Akathisia:  Negative  Handed:  Right  AIMS (if indicated):     Assets:  Desire for Improvement Resilience  ADL's: fair   Cognition: WNL  Sleep:  Number of Hours: 6.5     Current Medications: Current Facility-Administered Medications  Medication Dose Route Frequency Provider Last Rate Last Dose  . acetaminophen (TYLENOL) tablet 650 mg  650 mg Oral Q6H PRN Lurena Nida, NP   650 mg at 03/19/14 1656  . alum & mag hydroxide-simeth (MAALOX/MYLANTA) 200-200-20 MG/5ML suspension 30 mL  30 mL Oral PRN Lurena Nida, NP   30 mL at 03/20/14 0721  . cloNIDine (CATAPRES) tablet 0.1 mg  0.1 mg Oral QAC breakfast Lurena Nida, NP   0.1 mg at 03/20/14 0720  . dicyclomine (BENTYL) tablet 20 mg  20 mg Oral Q6H PRN Lurena Nida, NP      . hydrOXYzine (ATARAX/VISTARIL) tablet 50 mg  50 mg Oral Q6H PRN Lurena Nida, NP   50 mg at 03/17/14 2114  . loperamide (IMODIUM) capsule 2-4 mg  2-4 mg Oral PRN Lurena Nida, NP      . magnesium hydroxide (MILK OF MAGNESIA) suspension 30 mL  30 mL Oral Daily PRN Lurena Nida, NP      . methocarbamol (ROBAXIN) tablet  500 mg  500 mg Oral Q8H PRN Lurena Nida, NP   500 mg at 03/19/14 1655  . naproxen (NAPROSYN) tablet 500 mg  500 mg Oral BID PRN Lurena Nida, NP   500 mg at 03/19/14 1311  . ondansetron (ZOFRAN) tablet 4 mg  4 mg Oral Q8H PRN Lurena Nida, NP      . phenylephrine-shark liver oil-mineral oil-petrolatum (PREPARATION H) rectal ointment   Rectal BID PRN Encarnacion Slates, NP      . thiamine (VITAMIN B-1) tablet 100 mg  100 mg Oral Daily Lurena Nida, NP   100 mg at 03/20/14 0719   Or  . thiamine (B-1) injection  100 mg  100 mg Intravenous Daily Lurena Nida, NP      . traMADol Veatrice Bourbon) tablet 50 mg  50 mg Oral Q6H PRN Lurena Nida, NP   50 mg at 03/20/14 0721  . ziprasidone (GEODON) capsule 40 mg  40 mg Oral BID WC Myer Peer Adan Baehr, MD   40 mg at 03/20/14 0720  . zolpidem (AMBIEN) tablet 10 mg  10 mg Oral QHS PRN Jenne Campus, MD   10 mg at 03/20/14 0022    Lab Results: No results found for this or any previous visit (from the past 30 hour(s)).  Physical Findings: AIMS: Facial and Oral Movements Muscles of Facial Expression: None, normal Lips and Perioral Area: None, normal Jaw: None, normal Tongue: None, normal,Extremity Movements Upper (arms, wrists, hands, fingers): None, normal Lower (legs, knees, ankles, toes): None, normal, Trunk Movements Neck, shoulders, hips: None, normal, Overall Severity Severity of abnormal movements (highest score from questions above): None, normal Incapacitation due to abnormal movements: None, normal Patient's awareness of abnormal movements (rate only patient's report): No Awareness, Dental Status Current problems with teeth and/or dentures?: Yes Does patient usually wear dentures?: No  CIWA:  CIWA-Ar Total: 4 COWS:  COWS Total Score: 5   Assessment: Patient  Improved compared to admission. Mood is improved and today seems euthymic, affect is appropriate. Mild residual opiate withdrawal symptoms. Tolerating Geodon trial well. Slept better on Ambien  PRN. No side effects.  Treatment Plan Summary: Daily contact with patient to assess and evaluate symptoms and progress in treatment, Medication management, Plan continue inpatient treatment  and continue medications as below  Continue Geodon to 40 mgrs BID Continue  Ambien 10  mgrs QHS PRN Insomnia Completing opiate detox protocol ( clonidine) .    Medical Decision Making:  Established Problem, Stable/Improving (1), Review of Medication Regimen & Side Effects (2) and Review of New Medication or Change in Dosage (2) Problem Points:  Established problem, stable/improving (1), Review of last therapy session (1) and Review of psycho-social stressors (1) Data Points:  Review of medication regiment & side effects (2)    Ashmi Blas 03/20/2014, 12:41 PM

## 2014-03-20 NOTE — Progress Notes (Signed)
Patient denied SI and HI, contracts for safety.  Denied A/V hallucinations.  Patient has been playing cards in dayroom with peers.  Emotional support and encouragement given patient.  Safety maintained with 15 minute checks.

## 2014-03-20 NOTE — Tx Team (Signed)
Interdisciplinary Treatment Plan Update   Date Reviewed:  03/20/2014  Time Reviewed:  10:10 AM  Progress in Treatment:   Attending groups: Patient is new to the [facility]. Participating in groups: Patient is new to the [facility]. Taking medication as prescribed: Yes  Tolerating medication: Yes Family/Significant other contact made:  No, but will ask patient for consent for collateral contact Patient understands diagnosis: Yes  Discussing patient identified problems/goals with staff: Yes Medical problems stabilized or resolved: Yes Denies suicidal/homicidal ideation: Yes. Patient has not harmed self or others: Yes  For review of initial/current patient goals, please see plan of care.  Estimated Length of Stay:  3-4 days  Reasons for Continued Hospitalization:  Anxiety Depression Medication stabilization   New Problems/Goals identified:    Discharge Plan or Barriers:   Home with outpatient follow up to be determined  Additional Comments:   Continue medication stabilization.   Patient and CSW reviewed patient's identified goals and treatment plan.  Patient verbalized understanding and agreed to treatment plan.   Attendees:  Patient:  03/20/2014 10:10 AM   Signature:  Sallyanne HaversF. Cobos, MD 03/20/2014 10:10 AM  Signature:  03/20/2014 10:10 AM  Signature:  Earl ManySara Twyman, RN 03/20/2014 10:10 AM  Signature:  Rolla PlateAngela Troxler, RN 03/20/2014 10:10 AM  Signature:  Liborio NixonPatrice White, RN 03/20/2014 10:10 AM  Signature:  Juline PatchQuylle Tonnie Stillman, LCSW 03/20/2014 10:10 AM   03/20/2014 10:10 AM   03/20/2014 10:10 AM   03/20/2014 10:10 AM   03/20/2014  10:10 AM   03/20/2014  10:10 AM   03/20/2014  10:10 AM    Scribe for Treatment Team:   Juline PatchQuylle Ajane Novella,  03/20/2014 10:10 AM

## 2014-03-20 NOTE — Progress Notes (Signed)
Patient did wake up tonight to come get pain medications (Tramadol)  and Ambien for sleep.

## 2014-03-20 NOTE — Progress Notes (Signed)
[facility] Group Notes:  (Nursing/MHT/Case Management/Adjunct)  Date:  03/20/2014  Time:  9:53 PM  Type of Therapy:  Psychoeducational Skills  Participation Level:  Minimal  Participation Quality:  Resistant  Affect:  Appropriate  Cognitive:  Lacking  Insight:  Lacking  Engagement in Group:  Limited  Modes of Intervention:  Education  Summary of Progress/Problems: The patient verbalized that his day went "all right" since he played cards with his peers. He did little to explain the rest of his day. As a theme for the day, his coping skill is to talk more in public.   Hazle CocaGOODMAN, Kristopher Delk S 03/20/2014, 9:53 PM

## 2014-03-21 ENCOUNTER — Encounter (HOSPITAL_COMMUNITY): Payer: Self-pay | Admitting: Registered Nurse

## 2014-03-21 DIAGNOSIS — F315 Bipolar disorder, current episode depressed, severe, with psychotic features: Secondary | ICD-10-CM

## 2014-03-21 MED ORDER — ZIPRASIDONE HCL 40 MG PO CAPS: 40.0000 mg | ORAL_CAPSULE | Freq: Two times a day (BID) | ORAL | Status: DC

## 2014-03-21 MED ORDER — TRAMADOL HCL 50 MG PO TABS: 50.0000 mg | ORAL_TABLET | Freq: Four times a day (QID) | ORAL | Status: DC | PRN

## 2014-03-21 MED ORDER — HYDROXYZINE HCL 50 MG PO TABS: 50.0000 mg | ORAL_TABLET | Freq: Four times a day (QID) | ORAL | Status: DC | PRN

## 2014-03-21 MED ORDER — ZOLPIDEM TARTRATE 10 MG PO TABS: 10.0000 mg | ORAL_TABLET | Freq: Every evening | ORAL | Status: AC | PRN

## 2014-03-21 NOTE — [facility] Group Notes (Signed)
[facility]:  Coping skills  Date:  03/21/2014  Time:10:00  Type of Therapy:  Nurse Education  Participation Level:  Active  Participation Quality:  Appropriate  Affect:  Appropriate  Cognitive:  Alert  Insight:  Appropriate  Engagement in Group:  Engaged  Modes of Intervention:  Discussion  Summary of Progress/Problems:pt did attend group this am  Rodman KeyWebb, Latalia Etzler [facility] Region[facility]es 03/21/2014, 10:23 AM

## 2014-03-21 NOTE — Progress Notes (Addendum)
Pt is very anxious to leave today. He stated," My dad can pick me up. 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 " Pt stated he could live with his dad tonight and tomorrow and then will [facility] Monday or go to a shelter. Pt does contract for safety and denies Si and HI.He has been pacing in the halls asking what the verdict is about him going home. Pt did go to group this am. He was given 50mg  of tramadol for chronic knee pain. Pain is a 6/10. Spoke with pts father who stated he will come and pick the pt up.Dad did say that his wife did not want the pt at the house but he would let him stay there tonight. Pt appears very anxious to leave pacing up and down the halls. He stated,"I can make about $700.00 this weekend shoveling and cutting down trees. "pt stated he does not need medications as he has some at home. He will take the RX. May made aware.

## 2014-03-21 NOTE — Discharge Summary (Signed)
Physician Discharge Summary Note  Patient:  Brandon [name] is an 49 y.o., male MRN:  [id number] DOB:  02/21/1966 Patient phone:  [phone] (home)  Patient address:   [place] [address],  Total Time spent with patient: Greater than 30 minutes  Date of Admission:  03/16/2014 Date of Discharge: 03/21/2014  Reason for Admission:  Brandon [name] is a 31 yo old Caucasian male who presented to [facility] endorsing suicide with plans to overdose on IV Heroin. He was living in a half-way house ([facility]) and started "shooting dope with some of the people there." He states "heroin I can handle but not cocaine." States "I went cocaine crazy Friday." States he got paid Friday and continued to do cocaine. States he stole stuff and was thrown out of the half-way house. States she went to his dad's and drew up "1 gram of dark tar heroin and I was getting ready to shoot it and my dad walked in and knocked it out of my hand." He reports a long history of substance abuse which also includes pain pills, benzodiazepines, amphetamines, and hallucinogens. Patient reports using cocaine since the age of 44, daily, snorting and IV, $100 or more, and last used 03/14/2014. Patient reports using heroin since the age of 4, IV, a gram or more, last used 03/14/2014. Patient reports using alcohol since the age of 23, fifth of vodka daily, last drank 03/14/2014. Patient reports using pain pills, Oxcycotin, Vicodin, opana, various amount, and last used a week ago. Patient reports using Adderall, started 1 year ago, snorting, and last used a month ago.  Principal Problem: Bipolar disorder, current episode depressed, severe, without psychotic features Discharge Diagnoses: Patient Active Problem List   Diagnosis Date Noted  . Bipolar depression [F31.30] 03/16/2014  . Bipolar disorder, current episode depressed, severe, without psychotic features [F31.4]   . Polysubstance abuse [F19.10]   . Bipolar disorder [F31.9]  11/05/2013  . 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 Acute meniscal tear of right knee [S83.206A] 11/04/2013  . Dehydration [E86.0] 10/27/2013  . Rhabdomyolysis [M62.82] 10/27/2013  . Polysubstance dependence [F19.20] 11/06/2012  . Overdose by amphetamine [H47.425Z] 11/02/2012  . Overdose [T50.901A] 10/14/2012  . Bipolar affective disorder [F31.9] 03/17/2012    Class: Chronic  . Cocaine abuse [F14.10] 03/17/2012    Class: Acute  . Mood disorder [F39] 03/16/2012  . Normocytic anemia [D64.9] 03/15/2012  . Suicidal ideation [R45.851] 03/12/2012  . Substance abuse [F19.10] 03/12/2012  . ANEMIA NOS [D64.9] 03/27/2006  . OPIOID DEPENDENCE [F11.20] 03/27/2006  . COCAINE ABUSE [F14.10] 03/27/2006  . TRANSAMINASES, SERUM, ELEVATED [R74.0] 03/27/2006  . GASTROPARESIS [K31.84] 02/16/2006  . KNEE PAIN, RIGHT, CHRONIC [M25.569] 02/16/2006    Musculoskeletal: Strength & Muscle Tone: within normal limits Gait & Station: normal Patient leans: N/A  Psychiatric Specialty Exam:  See Suicide Risk Assessment Physical Exam  ROS  Blood pressure 110/76, pulse 85, temperature 97.8 F (36.6 C), temperature source Oral, resp. rate 18, height  (1.905 m), weight 109.77 kg (242 lb), SpO2 100 %.Body mass index is 30.25 kg/(m^2).    Past Medical History:  Past Medical History  Diagnosis Date  . Drug abuse   . Kidney failure   . Bipolar 1 disorder   . Rhabdomyolysis   . Acute renal failure   . Opiate abuse, continuous   . Dental caries     extensive  . Anxiety   . Depression   . Bipolar affective disorder 03/17/2012  . Mood disorder 03/16/2012  Past Surgical History  Procedure Laterality Date  . Knee surgery      x 5   Family History:  Family History  Problem Relation Age of Onset  . Heart disease Mother   . Heart disease Father    Social History:  History  Alcohol Use No     History  Drug Use  . Yes  . Special: Oxycodone, Amphetamines, Barbituates, Cocaine,  Methamphetamines    Comment: admits to snorting percocet today    History   Social History  . Marital Status: Divorced    Spouse Name: N/A    Number of Children: N/A  . Years of Education: N/A   Social History Main Topics  . Smoking status: Never Smoker   . Smokeless tobacco: Never Used  . Alcohol Use: No  . Drug Use: Yes    Special: Oxycodone, Amphetamines, Barbituates, Cocaine, Methamphetamines     Comment: admits to snorting percocet today  . Sexual Activity: Yes    Birth Control/ Protection: None   Other Topics Concern  . None   Social History Narrative   ** Merged History Encounter **        Past Psychiatric History: Hospitalizations:  Outpatient Care:  Substance Abuse Care:  Self-Mutilation:  Suicidal Attempts:  Violent Behaviors:   Risk to Self: Is patient at risk for suicide?: Yes What has been your use of drugs/alcohol within the last 12 months?: Patient reports abusing alcohol, cocaine, heroin and pills - as much as he can get each day  Risk to Others:   Prior Inpatient Therapy:   Prior Outpatient Therapy:    Level of Care:  OP  Hospital Course:    Brandon [name] was admitted for Bipolar disorder, current episode depressed, severe, without psychotic features and crisis management.  He was treated with Vistaril for anxiety, Geodon for mood stabilization, and Ambien for insomnia.  Medical problems were identified and treated as needed.  Home medications were restarted as appropriate.  Improvement was monitored by observation and Brandon [name] daily report of symptom reduction.  Emotional and mental status was monitored by daily self inventory reports completed by Brandon [name] and clinical staff.         Brandon [name] was evaluated by the treatment team for stability and plans for continued recovery upon discharge.  He was offered further treatment options upon discharge including Residential, Intensive Outpatient and Outpatient treatment. He/She will follow  up with [facility] and [facility] for medication management and counseling.     Brandon [name] motivation was an integral factor for scheduling further treatment.  Employment, transportation, bed availability, health status, family support, and any pending legal issues were also considered during his hospital stay.  Upon completion of this admission the patient was both mentally and medically stable for discharge denying suicidal/homicidal ideation, auditory/visual/tactile hallucinations, delusional thoughts and paranoia.       Consults:  psychiatry  Significant Diagnostic Studies:  labs: UDS, ETOH, CBC/Diff, CMET, Urinalysis  Discharge Vitals:   Blood pressure 110/76, pulse 85, temperature 97.8 F (36.6 C), temperature source Oral, resp. rate 18, height  (1.905 m), weight 109.77 kg (242 lb), SpO2 100 %. Body mass index is 30.25 kg/(m^2). Lab Results:   No results found for this or any previous visit (from the past 72 hour(s)).  Physical Findings: AIMS: Facial and Oral Movements Muscles of Facial Expression: None, normal Lips and Perioral Area: None, normal Jaw: None, normal Tongue: None, normal,Extremity Movements Upper (  arms, wrists, hands, fingers): None, normal Lower (legs, knees, ankles, toes): None, normal, Trunk Movements Neck, shoulders, hips: None, normal, Overall Severity Severity of abnormal movements (highest score from questions above): None, normal Incapacitation due to abnormal movements: None, normal Patient's awareness of abnormal movements (rate only patient's report): No Awareness, Dental Status Current problems with teeth and/or dentures?: Yes Does patient usually wear dentures?: No  CIWA:  CIWA-Ar Total: 1 COWS:  COWS Total Score: 0   See Psychiatric Specialty Exam and Suicide Risk Assessment completed by Attending Physician prior to discharge.  Discharge destination:  Home  Is patient on multiple antipsychotic therapies at discharge:  No    Has Patient had three or more failed trials of antipsychotic monotherapy by history:  No    Recommended Plan for Multiple Antipsychotic Therapies: NA     Medication List    STOP taking these medications        clonazePAM 1 MG tablet  Commonly known as:  KLONOPIN     diclofenac 75 MG EC tablet  Commonly known as:  VOLTAREN     diphenhydramine-acetaminophen 25-500 MG Tabs  Commonly known as:  TYLENOL PM     lurasidone 40 MG Tabs tablet  Commonly known as:  LATUDA     naproxen 500 MG tablet  Commonly known as:  NAPROSYN     traZODone 50 MG tablet  Commonly known as:  DESYREL      TAKE these medications      Indication   hydrOXYzine 50 MG tablet  Commonly known as:  ATARAX/VISTARIL  Take 1 tablet (50 mg total) by mouth every 6 (six) hours as needed for anxiety.   Indication:  Anxiety Neurosis     traMADol 50 MG tablet  Commonly known as:  ULTRAM  Take 1 tablet (50 mg total) by mouth every 6 (six) hours as needed for severe pain.      ziprasidone 40 MG capsule  Commonly known as:  GEODON  Take 1 capsule (40 mg total) by mouth 2 (two) times daily with a meal.   Indication:  mood srabilization     zolpidem 10 MG tablet  Commonly known as:  AMBIEN  Take 1 tablet (10 mg total) by mouth at bedtime as needed for sleep.   Indication:  Trouble Sleeping         Follow-up recommendations:  Activity:  As tolerated Diet:  As tolerated  Comments:   Patient has been instructed to take medications as prescribed; and report adverse effects to outpatient provider.  Follow up with primary doctor for any medical issues and If symptoms recur report to nearest emergency or crisis hot line.    Total Discharge Time: Greater than 30 minutes   Signed: Assunta FoundRankin, Shuvon, FNP-BC 03/21/2014, 12:01 PM   I have personally seen the patient and agreed with the findings and involved in the treatment plan. Kathryne SharperSyed Arfeen, MD

## 2014-03-21 NOTE — [facility] Suicide Risk Assessment (Signed)
[facility]   Demographic Factors:  Male and Caucasian  Total Time spent with patient: 30 minutes  Musculoskeletal: Strength & Muscle Tone: within normal limits Gait & Station: slow due to difficulty related to chronic knee pain Patient leans: N/A  Psychiatric Specialty Exam: Physical Exam  ROS  Blood pressure 110/76, pulse 85, temperature 97.8 F (36.6 C), temperature source Oral, resp. rate 18, height 6\' 3"  (1.905 m), weight 109.77 kg (242 lb), SpO2 100 %.Body mass index is 30.25 kg/(m^2).  General Appearance: Casual  Eye Contact::  Good  Speech:  Normal Rate409  Volume:  Normal  Mood:  Anxious  Affect:  Congruent  Thought Process:  Intact  Orientation:  Full (Time, Place, and Person)  Thought Content:  WDL  Suicidal Thoughts:  No  Homicidal Thoughts:  No  Memory:  Immediate;   Fair Recent;   Good Remote;   Fair  Judgement:  Good  Insight:  Good  Psychomotor Activity:  Restlessness  Concentration:  Fair  Recall:  Fair  Fund of Knowledge:Good  Language: Good  Akathisia:  No  Handed:  Right  AIMS (if indicated):     Assets:  Communication Skills Desire for Improvement Housing Physical Health Resilience Social Support  Sleep:  Number of Hours: 5.25  Cognition: WNL  ADL's:  Intact   Have you used any form of tobacco in the last 30 days? (Cigarettes, Smokeless Tobacco, Cigars, and/or Pipes): Yes  Has this patient used any form of tobacco in the last 30 days? (Cigarettes, Smokeless Tobacco, Cigars, and/or Pipes) No  Mental Status Per Nursing Assessment::   On Admission:  Suicidal ideation indicated by patient, Self-harm thoughts  Current Mental Status by Physician: NA  Loss Factors: Substance use  Historical Factors: Impulsivity and Substance use  Risk Reduction Factors:   Sense of responsibility to family, Religious beliefs about death, Employed, Living with another person, especially a relative, Positive social support,  Positive therapeutic relationship and Positive coping skills or problem solving skills  Continued Clinical Symptoms:  Alcohol/Substance Abuse/Dependencies  Cognitive Features That Contribute To Risk:  Polarized thinking    Suicide Risk:  Minimal: No identifiable suicidal ideation.  Patients presenting with no risk factors but with morbid ruminations; may be classified as minimal risk based on the severity of the depressive symptoms  Principal Problem: <principal problem not specified> Discharge Diagnoses:  Patient Active Problem List   Diagnosis Date Noted  . Bipolar depression [F31.30] 03/16/2014  . Bipolar disorder, current episode depressed, severe, without psychotic features [F31.4]   . Polysubstance abuse [F19.10]   . Bipolar disorder [F31.9] 11/05/2013  . 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 Acute meniscal tear of right knee [S83.206A] 11/04/2013  . Dehydration [E86.0] 10/27/2013  . Rhabdomyolysis [M62.82] 10/27/2013  . Polysubstance dependence [F19.20] 11/06/2012  . Overdose by amphetamine [Z61.096E][T43.621A] 11/02/2012  . Overdose [T50.901A] 10/14/2012  . Bipolar affective disorder [F31.9] 03/17/2012    Class: Chronic  . Cocaine abuse [F14.10] 03/17/2012    Class: Acute  . Mood disorder [F39] 03/16/2012  . Normocytic anemia [D64.9] 03/15/2012  . Suicidal ideation [R45.851] 03/12/2012  . Substance abuse [F19.10] 03/12/2012  . ANEMIA NOS [D64.9] 03/27/2006  . OPIOID DEPENDENCE [F11.20] 03/27/2006  . COCAINE ABUSE [F14.10] 03/27/2006  . TRANSAMINASES, SERUM, ELEVATED [R74.0] 03/27/2006  . GASTROPARESIS [K31.84] 02/16/2006  . KNEE PAIN, RIGHT, CHRONIC [M25.569] 02/16/2006      Plan Of Care/Follow-up recommendations:  Activity:  As tolerated Diet:  Unchanged from the past Other:  Patient  will stay with his father and then if he accepted that he will join day [facility] treatment patient also wants to continue NA and AA meetings.  He will receive his medication from [facility].   Is  patient on multiple antipsychotic therapies at discharge:  No   Has Patient had three or more failed trials of antipsychotic monotherapy by history:  No  Recommended Plan for Multiple Antipsychotic Therapies: NA    Makeshia Seat T. 03/21/2014, 10:59 AM

## 2014-03-21 NOTE — Progress Notes (Signed)
D: Pt denies SI/HI/AVH. Pt is pleasant and cooperative. Pt had minimal interaction on unit. Pt drowsy upon approach, and did not want to talk.   A: Pt was offered support and encouragement. Pt was given scheduled medications. Pt was encourage to attend groups. Q 15 minute checks were done for safety.   R: Pt is taking medication. Pt has no complaints.Pt receptive to treatment and safety maintained on unit.

## 2014-03-23 NOTE — Progress Notes (Signed)
Late Entry for 03/21/14   [facility] :  Will you be returning to the same living situation after discharge:  No.  [facility]. At discharge, do you have transportation home?: Yes,  Patient was to arrange transnportation hime. Do you have the ability to pay for your medications: No.  Patient needed assistance with indigent medications   Release of information consent forms completed and in the chart;  Patient's signature needed at discharge.  Patient to Follow up at: Follow-up Information    Follow up with [facility].   Why:  Per note of 03/21/14, patient to follow up with [facility].  [facility]    Contact information:   [address] [place], [place]  [id number]  [phone]      Patient denies SI/HI: Per RN note, patient was not endorsing SI/HI or thoughts of self harm at time of discharge.  Safety Planning and Suicide Prevention discussed: .Reviewed with all patients during discharge planning group   Has patient been referred to the Quitline?:Patient refused referral to Quitline [name], Akyla Vavrek Hairston 03/23/2014, 8:41 AM

## 2014-03-25 NOTE — Progress Notes (Signed)
Patient Discharge Instructions:  After Visit Summary (AVS):   Faxed to:  03/25/14 Discharge Summary Note:   Faxed to:  03/25/14 Psychiatric Admission Assessment Note:   Faxed to:  03/25/14 Suicide Risk Assessment - Discharge Assessment:   Faxed to:  03/25/14 Faxed/Sent to the Next Level Care provider:  03/25/14 [facility] @ [phone] Jerelene ReddenSheena E Ripon, 03/25/2014, 3:26 PM

## 2014-05-07 ENCOUNTER — Encounter (HOSPITAL_BASED_OUTPATIENT_CLINIC_OR_DEPARTMENT_OTHER): Payer: Self-pay | Admitting: *Deleted

## 2014-05-07 ENCOUNTER — Emergency Department (HOSPITAL_BASED_OUTPATIENT_CLINIC_OR_DEPARTMENT_OTHER)
Admission: EM | Admit: 2014-05-07 | Discharge: 2014-05-07 | Disposition: A | Discharge status: Home / Self Care | Payer: Self-pay | Attending: Emergency Medicine | Admitting: Emergency Medicine

## 2014-05-07 ENCOUNTER — Emergency Department (HOSPITAL_BASED_OUTPATIENT_CLINIC_OR_DEPARTMENT_OTHER): Payer: Self-pay

## 2014-05-07 DIAGNOSIS — Y998 Other external cause status: Secondary | ICD-10-CM | POA: Insufficient documentation

## 2014-05-07 DIAGNOSIS — Z8719 Personal history of other diseases of the digestive system: Secondary | ICD-10-CM | POA: Insufficient documentation

## 2014-05-07 DIAGNOSIS — Z8739 Personal history of other diseases of the musculoskeletal system and connective tissue: Secondary | ICD-10-CM | POA: Insufficient documentation

## 2014-05-07 DIAGNOSIS — Y93K1 Activity, walking an animal: Secondary | ICD-10-CM | POA: Insufficient documentation

## 2014-05-07 DIAGNOSIS — F419 Anxiety disorder, unspecified: Secondary | ICD-10-CM | POA: Insufficient documentation

## 2014-05-07 DIAGNOSIS — Z87448 Personal history of other diseases of urinary system: Secondary | ICD-10-CM | POA: Insufficient documentation

## 2014-05-07 DIAGNOSIS — S8991XA Unspecified injury of right lower leg, initial encounter: Secondary | ICD-10-CM | POA: Insufficient documentation

## 2014-05-07 DIAGNOSIS — W010XXA Fall on same level from slipping, tripping and stumbling without subsequent striking against object, initial encounter: Secondary | ICD-10-CM | POA: Insufficient documentation

## 2014-05-07 DIAGNOSIS — F319 Bipolar disorder, unspecified: Secondary | ICD-10-CM | POA: Insufficient documentation

## 2014-05-07 DIAGNOSIS — Y9289 Other specified places as the place of occurrence of the external cause: Secondary | ICD-10-CM | POA: Insufficient documentation

## 2014-05-07 DIAGNOSIS — Z79899 Other long term (current) drug therapy: Secondary | ICD-10-CM | POA: Insufficient documentation

## 2014-05-07 MED ORDER — NAPROXEN 500 MG PO TABS: 500.0000 mg | ORAL_TABLET | Freq: Two times a day (BID) | ORAL | Status: DC

## 2014-05-07 MED ORDER — HYDROMORPHONE HCL 1 MG/ML IJ SOLN
INTRAMUSCULAR | Status: AC
  Administered 2014-05-07: 2 mg via INTRAMUSCULAR
  Filled 2014-05-07: qty 2

## 2014-05-07 MED ORDER — HYDROMORPHONE HCL 2 MG/ML IJ SOLN
2.0000 mg | Freq: Once | INTRAMUSCULAR | Status: AC
  Filled 2014-05-07: qty 1

## 2014-05-07 NOTE — Discharge Instructions (Signed)
Use knee immobilizer at all times. Can remove it to take a shower. Use crutches. Make an appointment to follow-up with orthopedics. Take Naprosyn as directed. Elevate the right leg is much as possible.

## 2014-05-07 NOTE — ED Notes (Signed)
Pt sts his dog tripped him on the steps and he felt a pop in his right knee.

## 2014-05-07 NOTE — ED Provider Notes (Signed)
CSN: [id number]     Arrival date & time 05/07/14  1503 History   First MD Initiated Contact with Patient 05/07/14 1650     Chief Complaint  Patient presents with  . Knee Pain     (Consider location/radiation/quality/duration/timing/severity/associated sxs/prior Treatment) Patient is a 49 y.o. male presenting with knee pain. The history is provided by the patient.  Knee Pain Associated symptoms: no back pain, no fever and no neck pain   patient tripped walking his dog and fell onto the steps. Felt a pop in his right knee. This happened around noontime. Now is develop swelling to the right knee. Patient is followed by Dr. Lajoyce Cornersuda from orthopedics in the past. Patient has a history of opiate abuse. Patient states the pain is 10 out of 10. No other injuries. No loss of consciousness.  Past Medical History  Diagnosis Date  . Drug abuse   . Kidney failure   . Bipolar 1 disorder   . Rhabdomyolysis   . Acute renal failure   . Opiate abuse, continuous   . Dental caries     extensive  . Anxiety   . Depression   . Bipolar affective disorder 03/17/2012  . Mood disorder 03/16/2012   Past Surgical History  Procedure Laterality Date  . Knee surgery      x 5   Family History  Problem Relation Age of Onset  . Heart disease Mother   . Heart disease Father    History  Substance Use Topics  . Smoking status: Never Smoker   . Smokeless tobacco: Never Used  . Alcohol Use: No    Review of Systems  Constitutional: Negative for fever.  HENT: Negative for congestion.   Eyes: Negative for redness.  Respiratory: Negative for shortness of breath.   Cardiovascular: Negative for chest pain.  Gastrointestinal: Negative for abdominal pain.  Genitourinary: Negative for dysuria.  Musculoskeletal: Positive for joint swelling. Negative for back pain and neck pain.  Skin: Negative for wound.  Neurological: Negative for headaches.  Hematological: Does not bruise/bleed easily.  Psychiatric/Behavioral:  Negative for confusion.      Allergies  Other  Home Medications   Prior to Admission medications   Medication Sig Start Date End Date Taking? Authorizing Provider  lurasidone (LATUDA) 40 MG TABS tablet Take 40 mg by mouth daily with breakfast.   Yes Historical Provider, MD  hydrOXYzine (ATARAX/VISTARIL) 50 MG tablet Take 1 tablet (50 mg total) by mouth every 6 (six) hours as needed for anxiety. 03/21/14   Adonis BrookSheila Agustin, NP  naproxen (NAPROSYN) 500 MG tablet Take 1 tablet (500 mg total) by mouth 2 (two) times daily. 05/07/14   Vanetta MuldersScott Tai Syfert, MD  traMADol (ULTRAM) 50 MG tablet Take 1 tablet (50 mg total) by mouth every 6 (six) hours as needed for severe pain. 03/21/14   Adonis BrookSheila Agustin, NP  ziprasidone (GEODON) 40 MG capsule Take 1 capsule (40 mg total) by mouth 2 (two) times daily with a meal. 03/21/14   Adonis BrookSheila Agustin, NP   BP 118/70 mmHg  Pulse 64  Temp(Src) 97.8 F (36.6 C) (Oral)  Resp 18  Ht 6\' 3"  (1.905 m)  Wt 250 lb (113.399 kg)  BMI 31.25 kg/m2  SpO2 99% Physical Exam  Constitutional: He is oriented to person, place, and time. He appears well-developed and well-nourished. No distress.  HENT:  Head: Normocephalic and atraumatic.  Mouth/Throat: Oropharynx is clear and moist.  Eyes: Conjunctivae and EOM are normal. Pupils are equal, round, and reactive to light.  Neck: Normal range of motion.  Cardiovascular: Normal rate, regular rhythm and normal heart sounds.   Pulmonary/Chest: Effort normal and breath sounds normal. No respiratory distress.  Abdominal: Soft. Bowel sounds are normal. There is no tenderness.  Musculoskeletal: He exhibits edema and tenderness.  No abrasion to the knee. Right knee does have an effusion small to moderate. Patellas in place. Dorsalis pedis pulses are 2+ distally. Limited range of motion due to pain. No deformity. No other extremity injuries.  Neurological: He is alert and oriented to person, place, and time. No cranial nerve deficit. He exhibits  normal muscle tone. Coordination normal.  Skin: Skin is warm. No erythema.  Nursing note and vitals reviewed.   ED Course  Procedures (including critical care time) Labs Review Labs Reviewed - No data to display  Imaging Review Dg Knee Complete 4 Views Right  05/07/2014   CLINICAL DATA:  Twisting injury to the knee. Medial RIGHT knee pain. History of ACL operation.  EXAM: RIGHT KNEE - COMPLETE 4+ VIEW  COMPARISON:  11/06/2013.  FINDINGS: Moderate knee effusion is evident on the lateral view. Moderate medial compartment and mild lateral compartment osteoarthritis. Mild to moderate patellofemoral osteoarthritis. On the frontal projections, the patella appears located. There is mild varus deformity of the knee associated with medial compartment joint space loss. Mild prepatellar soft tissue swelling.  IMPRESSION: Moderate effusion. Tricompartmental osteoarthritis, worst in the medial compartment.   Electronically Signed   By: Andreas Newport M.D.   On: 05/07/2014 16:03     EKG Interpretation None      MDM   Final diagnoses:  Knee injury, right, initial encounter    Patient status post fall today landing on right knee happened around noon time. Patient with pain and swelling to the right knee. X-ray shows no bony abnormalities but does show significant osteoarthritis. Also shows moderate effusion. Possibility of internal injury is there. Patient retrieve a knee immobilizer crutches anti-inflammatory medicines and follow-up with orthopedics.  Patient has a history of substance abuse.    Vanetta Mulders, MD 05/07/14 [phone]

## 2014-08-12 ENCOUNTER — Encounter (HOSPITAL_BASED_OUTPATIENT_CLINIC_OR_DEPARTMENT_OTHER): Payer: Self-pay | Admitting: *Deleted

## 2014-08-12 ENCOUNTER — Emergency Department (HOSPITAL_BASED_OUTPATIENT_CLINIC_OR_DEPARTMENT_OTHER)
Admission: EM | Admit: 2014-08-12 | Discharge: 2014-08-12 | Disposition: A | Discharge status: Home / Self Care | Payer: Self-pay | Attending: Emergency Medicine | Admitting: Emergency Medicine

## 2014-08-12 ENCOUNTER — Emergency Department (HOSPITAL_BASED_OUTPATIENT_CLINIC_OR_DEPARTMENT_OTHER): Payer: Self-pay

## 2014-08-12 DIAGNOSIS — R05 Cough: Secondary | ICD-10-CM

## 2014-08-12 DIAGNOSIS — R1013 Epigastric pain: Secondary | ICD-10-CM | POA: Insufficient documentation

## 2014-08-12 DIAGNOSIS — W57XXXA Bitten or stung by nonvenomous insect and other nonvenomous arthropods, initial encounter: Secondary | ICD-10-CM | POA: Insufficient documentation

## 2014-08-12 DIAGNOSIS — R112 Nausea with vomiting, unspecified: Secondary | ICD-10-CM | POA: Insufficient documentation

## 2014-08-12 DIAGNOSIS — R1011 Right upper quadrant pain: Secondary | ICD-10-CM | POA: Insufficient documentation

## 2014-08-12 DIAGNOSIS — Z8739 Personal history of other diseases of the musculoskeletal system and connective tissue: Secondary | ICD-10-CM | POA: Insufficient documentation

## 2014-08-12 DIAGNOSIS — Y998 Other external cause status: Secondary | ICD-10-CM | POA: Insufficient documentation

## 2014-08-12 DIAGNOSIS — Z79899 Other long term (current) drug therapy: Secondary | ICD-10-CM | POA: Insufficient documentation

## 2014-08-12 DIAGNOSIS — Z8719 Personal history of other diseases of the digestive system: Secondary | ICD-10-CM | POA: Insufficient documentation

## 2014-08-12 DIAGNOSIS — F1110 Opioid abuse, uncomplicated: Secondary | ICD-10-CM | POA: Insufficient documentation

## 2014-08-12 DIAGNOSIS — R197 Diarrhea, unspecified: Secondary | ICD-10-CM | POA: Insufficient documentation

## 2014-08-12 DIAGNOSIS — R1012 Left upper quadrant pain: Secondary | ICD-10-CM | POA: Insufficient documentation

## 2014-08-12 DIAGNOSIS — Z87448 Personal history of other diseases of urinary system: Secondary | ICD-10-CM | POA: Insufficient documentation

## 2014-08-12 DIAGNOSIS — Y9389 Activity, other specified: Secondary | ICD-10-CM | POA: Insufficient documentation

## 2014-08-12 DIAGNOSIS — S50862A Insect bite (nonvenomous) of left forearm, initial encounter: Secondary | ICD-10-CM | POA: Insufficient documentation

## 2014-08-12 DIAGNOSIS — Z791 Long term (current) use of non-steroidal anti-inflammatories (NSAID): Secondary | ICD-10-CM | POA: Insufficient documentation

## 2014-08-12 DIAGNOSIS — R531 Weakness: Secondary | ICD-10-CM | POA: Insufficient documentation

## 2014-08-12 DIAGNOSIS — Y9289 Other specified places as the place of occurrence of the external cause: Secondary | ICD-10-CM | POA: Insufficient documentation

## 2014-08-12 DIAGNOSIS — F319 Bipolar disorder, unspecified: Secondary | ICD-10-CM | POA: Insufficient documentation

## 2014-08-12 DIAGNOSIS — R059 Cough, unspecified: Secondary | ICD-10-CM

## 2014-08-12 LAB — CBC WITH DIFFERENTIAL/PLATELET
BASOS ABS: 0 10*3/uL (ref 0.0–0.1)
BASOS PCT: 0 % (ref 0–1)
EOS ABS: 0.1 10*3/uL (ref 0.0–0.7)
Eosinophils Relative: 1 % (ref 0–5)
HCT: 40.9 % (ref 39.0–52.0)
Hemoglobin: 14 g/dL (ref 13.0–17.0)
Lymphocytes Relative: 25 % (ref 12–46)
Lymphs Abs: 1.5 10*3/uL (ref 0.7–4.0)
MCH: 30.5 pg (ref 26.0–34.0)
MCHC: 34.2 g/dL (ref 30.0–36.0)
MCV: 89.1 fL (ref 78.0–100.0)
MONO ABS: 0.5 10*3/uL (ref 0.1–1.0)
Monocytes Relative: 9 % (ref 3–12)
NEUTROS ABS: 4 10*3/uL (ref 1.7–7.7)
Neutrophils Relative %: 65 % (ref 43–77)
Platelets: 406 10*3/uL — ABNORMAL HIGH (ref 150–400)
RBC: 4.59 MIL/uL (ref 4.22–5.81)
RDW: 12 % (ref 11.5–15.5)
WBC: 6.1 10*3/uL (ref 4.0–10.5)

## 2014-08-12 LAB — URINALYSIS, ROUTINE W REFLEX MICROSCOPIC
BILIRUBIN URINE: NEGATIVE
Glucose, UA: NEGATIVE mg/dL
Hgb urine dipstick: NEGATIVE
Ketones, ur: 40 mg/dL — AB
LEUKOCYTES UA: NEGATIVE
NITRITE: NEGATIVE
PH: 7.5 (ref 5.0–8.0)
Protein, ur: NEGATIVE mg/dL
Specific Gravity, Urine: 1.028 (ref 1.005–1.030)
Urobilinogen, UA: 1 mg/dL (ref 0.0–1.0)

## 2014-08-12 LAB — COMPREHENSIVE METABOLIC PANEL
ALK PHOS: 48 U/L (ref 38–126)
ALT: 24 U/L (ref 17–63)
AST: 24 U/L (ref 15–41)
Albumin: 3.5 g/dL (ref 3.5–5.0)
Anion gap: 13 (ref 5–15)
BUN: 17 mg/dL (ref 6–20)
CHLORIDE: 105 mmol/L (ref 101–111)
CO2: 21 mmol/L — ABNORMAL LOW (ref 22–32)
Calcium: 9.3 mg/dL (ref 8.9–10.3)
Creatinine, Ser: 1.01 mg/dL (ref 0.61–1.24)
GFR calc Af Amer: >60 mL/min (ref >60)
GLUCOSE: 115 mg/dL — AB (ref 65–99)
Potassium: 4.3 mmol/L (ref 3.5–5.1)
Sodium: 139 mmol/L (ref 135–145)
Total Bilirubin: 0.7 mg/dL (ref 0.3–1.2)
Total Protein: 7.8 g/dL (ref 6.5–8.1)

## 2014-08-12 LAB — LIPASE, BLOOD: LIPASE: 24 U/L (ref 22–51)

## 2014-08-12 LAB — RAPID URINE DRUG SCREEN, HOSP PERFORMED
Amphetamines: NOT DETECTED
Barbiturates: NOT DETECTED
Benzodiazepines: NOT DETECTED
Cocaine: NOT DETECTED
Opiates: POSITIVE — AB
TETRAHYDROCANNABINOL: NOT DETECTED

## 2014-08-12 LAB — TROPONIN I: Troponin I: <0.03 ng/mL (ref <0.031)

## 2014-08-12 LAB — ETHANOL

## 2014-08-12 MED ORDER — SODIUM CHLORIDE 0.9 % IV BOLUS (SEPSIS)
500.0000 mL | Freq: Once | INTRAVENOUS | Status: AC
  Administered 2014-08-12: 500 mL via INTRAVENOUS

## 2014-08-12 MED ORDER — DOXYCYCLINE HYCLATE 100 MG PO CAPS: 100.0000 mg | ORAL_CAPSULE | Freq: Two times a day (BID) | ORAL | Status: DC

## 2014-08-12 MED ORDER — ONDANSETRON HCL 4 MG/2ML IJ SOLN: 4.0000 mg | Freq: Once | INTRAMUSCULAR | Status: DC

## 2014-08-12 NOTE — ED Provider Notes (Signed)
CSN: [id number]     Arrival date & time 08/12/14  0815 History   None    Chief Complaint  Patient presents with  . Insect Bite     (Consider location/radiation/quality/duration/timing/severity/associated sxs/prior Treatment) HPI Comments: Patient presents to the emergency department for evaluation of multiple problems. Patient reportedly noticed a tender bump on his left arm 2 days ago. He thinks he was bitten by an insect. Father reports that last night he became ill and had nausea and vomiting after dinner. When he awakened this morning he was very weak, reports that he could not get to work. Father was bringing him to the emergency when he suddenly became extremely weak and unable to stand or walk. Patient needed to be taken out of his car in order to be brought into the ER. He reports that he is experiencing some upper abdominal discomfort. There is no further vomiting. He has been expressing diarrhea. He also reports cough, chest congestion and shortness of breath.   Past Medical History  Diagnosis Date  . Drug abuse   . Kidney failure   . Bipolar 1 disorder   . Rhabdomyolysis   . Acute renal failure   . Opiate abuse, continuous   . Dental caries     extensive  . Anxiety   . Depression   . Bipolar affective disorder 03/17/2012  . Mood disorder 03/16/2012   Past Surgical History  Procedure Laterality Date  . Knee surgery      x 5   Family History  Problem Relation Age of Onset  . Heart disease Mother   . Heart disease Father    History  Substance Use Topics  . Smoking status: Never Smoker   . Smokeless tobacco: Never Used  . Alcohol Use: No    Review of Systems  Constitutional: Positive for fatigue.  Respiratory: Positive for cough and shortness of breath.   Gastrointestinal: Positive for nausea, vomiting, abdominal pain and diarrhea.  All other systems reviewed and are negative.     Allergies  Other  Home Medications   Prior to Admission medications    Medication Sig Start Date End Date Taking? Authorizing Provider  OXYCODONE PO Take by mouth.   Yes Historical Provider, MD  traMADol (ULTRAM) 50 MG tablet Take 1 tablet (50 mg total) by mouth every 6 (six) hours as needed for severe pain. 03/21/14  Yes Adonis Brook, NP  Zolpidem Tartrate (AMBIEN PO) Take by mouth.   Yes Historical Provider, MD  doxycycline (VIBRAMYCIN) 100 MG capsule Take 1 capsule (100 mg total) by mouth 2 (two) times daily. 08/12/14   Gilda Crease, MD  hydrOXYzine (ATARAX/VISTARIL) 50 MG tablet Take 1 tablet (50 mg total) by mouth every 6 (six) hours as needed for anxiety. 03/21/14   Adonis Brook, NP  lurasidone (LATUDA) 40 MG TABS tablet Take 40 mg by mouth daily with breakfast.    Historical Provider, MD  naproxen (NAPROSYN) 500 MG tablet Take 1 tablet (500 mg total) by mouth 2 (two) times daily. 05/07/14   Vanetta Mulders, MD  ziprasidone (GEODON) 40 MG capsule Take 1 capsule (40 mg total) by mouth 2 (two) times daily with a meal. 03/21/14   Adonis Brook, NP   BP 119/67 mmHg  Pulse 69  Temp(Src) 99.1 F (37.3 C) (Rectal)  Resp 20  Ht 6' (1.829 m)  Wt 240 lb (108.863 kg)  BMI 32.54 kg/m2  SpO2 100% Physical Exam  Constitutional: He is oriented to person, place, and time.  He appears well-developed and well-nourished. No distress.  HENT:  Head: Normocephalic and atraumatic.  Right Ear: Hearing normal.  Left Ear: Hearing normal.  Nose: Nose normal.  Mouth/Throat: Oropharynx is clear and moist and mucous membranes are normal.  Eyes: Conjunctivae and EOM are normal. Pupils are equal, round, and reactive to light.  Neck: Normal range of motion. Neck supple.  Cardiovascular: Regular rhythm, S1 normal and S2 normal.  Exam reveals no gallop and no friction rub.   No murmur heard. Pulmonary/Chest: Effort normal and breath sounds normal. No respiratory distress. He exhibits no tenderness.  Abdominal: Soft. Normal appearance and bowel sounds are normal. There is  no hepatosplenomegaly. There is tenderness in the right upper quadrant, epigastric area and left upper quadrant. There is no rebound, no guarding, no tenderness at McBurney's point and negative Murphy's sign. No hernia.  Musculoskeletal: Normal range of motion.  Neurological: He is alert and oriented to person, place, and time. He has normal strength. No cranial nerve deficit or sensory deficit. Coordination normal. GCS eye subscore is 4. GCS verbal subscore is 5. GCS motor subscore is 6.  Skin: Skin is warm, dry and intact. No rash noted. No cyanosis.  Psychiatric: He has a normal mood and affect. His speech is normal and behavior is normal. Thought content normal.  Nursing note and vitals reviewed.   ED Course  Procedures (including critical care time) Labs Review Labs Reviewed  CBC WITH DIFFERENTIAL/PLATELET - Abnormal; Notable for the following:    Platelets 406 (*)    All other components within normal limits  COMPREHENSIVE METABOLIC PANEL - Abnormal; Notable for the following:    CO2 21 (*)    Glucose, Bld 115 (*)    All other components within normal limits  URINALYSIS, ROUTINE W REFLEX MICROSCOPIC (NOT AT [facility]) - Abnormal; Notable for the following:    Ketones, ur 40 (*)    All other components within normal limits  URINE RAPID DRUG SCREEN, HOSP PERFORMED - Abnormal; Notable for the following:    Opiates POSITIVE (*)    All other components within normal limits  LIPASE, BLOOD  ETHANOL  TROPONIN I    Imaging Review Dg Chest 2 View  08/12/2014   CLINICAL DATA:  Cough.  EXAM: CHEST  2 VIEW  COMPARISON:  05/26/2012  FINDINGS: Normal heart size and mediastinal contours. No acute infiltrate or edema. No effusion or pneumothorax. No acute osseous findings.  IMPRESSION: Negative chest.   Electronically Signed   By: Marnee Spring M.D.   On: 08/12/2014 08:57     EKG Interpretation   Date/Time:  Wednesday August 12 2014 08:49:48 EDT Ventricular Rate:  57 PR Interval:  142 QRS  Duration: 90 QT Interval:  428 QTC Calculation: 416 R Axis:   49 Text Interpretation:  Sinus bradycardia Otherwise normal ECG Confirmed by  Zael Shuman  MD, Ajwa Kimberley ([id number]) on 08/12/2014 9:08:44 AM      MDM   Final diagnoses:  General weakness  Tick bite    Patient presented to the ER for evaluation of generalized weakness. Patient reports that he thinks he was bitten by an insect on his left forearm because there was a tender lump in the region. He indicates that he has been sticking a needle to drain it, but nothing has drained. He does have a history of substance abuse, I did directly asked him if he was injecting drugs and he denies it. There is no erythema, warmth or induration associated. No swelling of the  arm to suggest DVT.  Patient indicates that he has been bitten by multiple ticks recently. Will cover empirically with doxycycline. Patient's workup has been unremarkable. He was given IV fluids here in the ER and seems to have improved.    Gilda Crease, MD 08/12/14 [phone]

## 2014-08-12 NOTE — ED Notes (Signed)
Patient eating and drinking w/o difficulty 

## 2014-08-12 NOTE — ED Notes (Signed)
Pt at X-ray

## 2014-08-12 NOTE — ED Notes (Signed)
Father of patient states the patient complained of getting bite by a spider two days ago.  States he has vomited several times yesterday and c/o generalized body aches.  States this morning he told his dad he wanted to go to the ED.  States the patient was able to ambulate to the car with no difficulty.  States while driving here, the patient passed out.  Patient was sitting beside the car and required full assist to get onto the stretcher.  Patient is hyperventilating, with eyes closed.

## 2014-08-12 NOTE — Discharge Instructions (Signed)
Insect Bite Mosquitoes, flies, fleas, bedbugs, and many other insects can bite. Insect bites are different from insect stings. A sting is when venom is injected into the skin. Some insect bites can transmit infectious diseases. SYMPTOMS  Insect bites usually turn red, swell, and itch for 2 to 4 days. They often go away on their own. TREATMENT  Your caregiver may prescribe antibiotic medicines if a bacterial infection develops in the bite. HOME CARE INSTRUCTIONS  Do not scratch the bite area.  Keep the bite area clean and dry. Wash the bite area thoroughly with soap and water.  Put ice or cool compresses on the bite area.  Put ice in a plastic bag.  Place a towel between your skin and the bag.  Leave the ice on for 20 minutes, 4 times a day for the first 2 to 3 days, or as directed.  You may apply a baking soda paste, cortisone cream, or calamine lotion to the bite area as directed by your caregiver. This can help reduce itching and swelling.  Only take over-the-counter or prescription medicines as directed by your caregiver.  If you are given antibiotics, take them as directed. Finish them even if you start to feel better. You may need a tetanus shot if:  You cannot remember when you had your last tetanus shot.  You have never had a tetanus shot.  The injury broke your skin. If you get a tetanus shot, your arm may swell, get red, and feel warm to the touch. This is common and not a problem. If you need a tetanus shot and you choose not to have one, there is a rare chance of getting tetanus. Sickness from tetanus can be serious. SEEK IMMEDIATE MEDICAL CARE IF:   You have increased pain, redness, or swelling in the bite area.  You see a red line on the skin coming from the bite.  You have a fever.  You have joint pain.  You have a headache or neck pain.  You have unusual weakness.  You have a rash.  You have chest pain or shortness of breath.  You have abdominal pain,  nausea, or vomiting.  You feel unusually tired or sleepy. MAKE SURE YOU:   Understand these instructions.  Will watch your condition.  Will get help right away if you are not doing well or get worse. Document Released: 03/23/2004 Document Revised: 05/08/2011 Document Reviewed: 09/14/2010 [facility] Patient Information 2015 [place], [place]. This information is not intended to replace advice given to you by your health care provider. Make sure you discuss any questions you have with your health care provider.  Tick Bite Information Ticks are insects that attach themselves to the skin and draw blood for food. There are various types of ticks. Common types include wood ticks and deer ticks. Most ticks live in shrubs and grassy areas. Ticks can climb onto your body when you make contact with leaves or grass where the tick is waiting. The most common places on the body for ticks to attach themselves are the scalp, neck, armpits, waist, and groin. Most tick bites are harmless, but sometimes ticks carry germs that cause diseases. These germs can be spread to a person during the tick's feeding process. The chance of a disease spreading through a tick bite depends on:   The type of tick.  Time of year.   How long the tick is attached.   Geographic location.  HOW CAN YOU PREVENT TICK BITES? Take these steps to  help prevent tick bites when you are outdoors:  Wear protective clothing. Long sleeves and long pants are best.   Wear white clothes so you can see ticks more easily.  Tuck your pant legs into your socks.   If walking on a trail, stay in the middle of the trail to avoid brushing against bushes.  Avoid walking through areas with long grass.  Put insect repellent on all exposed skin and along boot tops, pant legs, and sleeve cuffs.   Check clothing, hair, and skin repeatedly and before going inside.   Brush off any ticks that are not attached.  Take a shower or bath as soon  as possible after being outdoors.  WHAT IS THE PROPER WAY TO REMOVE A TICK? Ticks should be removed as soon as possible to help prevent diseases caused by tick bites.  If latex gloves are available, put them on before trying to remove a tick.   Using fine-point tweezers, grasp the tick as close to the skin as possible. You may also use curved forceps or a tick removal tool. Grasp the tick as close to its head as possible. Avoid grasping the tick on its body.  Pull gently with steady upward pressure until the tick lets go. Do not twist the tick or jerk it suddenly. This may break off the tick's head or mouth parts.  Do not squeeze or crush the tick's body. This could force disease-carrying fluids from the tick into your body.   After the tick is removed, wash the bite area and your hands with soap and water or other disinfectant such as alcohol.  Apply a small amount of antiseptic cream or ointment to the bite site.   Wash and disinfect any instruments that were used.  Do not try to remove a tick by applying a hot match, petroleum jelly, or fingernail polish to the tick. These methods do not work and may increase the chances of disease being spread from the tick bite.  WHEN SHOULD YOU SEEK MEDICAL CARE? Contact your health care provider if you are unable to remove a tick from your skin or if a part of the tick breaks off and is stuck in the skin.  After a tick bite, you need to be aware of signs and symptoms that could be related to diseases spread by ticks. Contact your health care provider if you develop any of the following in the days or weeks after the tick bite:  Unexplained fever.  Rash. A circular rash that appears days or weeks after the tick bite may indicate the possibility of Lyme disease. The rash may resemble a target with a bull's-eye and may occur at a different part of your body than the tick bite.  Redness and swelling in the area of the tick bite.   Tender,  swollen lymph glands.   Diarrhea.   Weight loss.   Cough.   Fatigue.   Muscle, joint, or bone pain.   Abdominal pain.   Headache.   Lethargy or a change in your level of consciousness.  Difficulty walking or moving your legs.   Numbness in the legs.   Paralysis.  Shortness of breath.   Confusion.   Repeated vomiting.  Document Released: 02/11/2000 Document Revised: 12/04/2012 Document Reviewed: 07/24/2012 [facility] Patient Information 2015 [place], [place]. This information is not intended to replace advice given to you by your health care provider. Make sure you discuss any questions you have with your health care provider.  Weakness  Weakness is a lack of strength. It may be felt all over the body (generalized) or in one specific part of the body (focal). Some causes of weakness can be serious. You may need further medical evaluation, especially if you are elderly or you have a history of immunosuppression (such as chemotherapy or HIV), kidney disease, heart disease, or diabetes. CAUSES  Weakness can be caused by many different things, including:  Infection.  Physical exhaustion.  Internal bleeding or other blood loss that results in a lack of red blood cells (anemia).  Dehydration. This cause is more common in elderly people.  Side effects or electrolyte abnormalities from medicines, such as pain medicines or sedatives.  Emotional distress, anxiety, or depression.  Circulation problems, especially severe peripheral arterial disease.  Heart disease, such as rapid atrial fibrillation, bradycardia, or heart failure.  Nervous system disorders, such as Guillain-Barr syndrome, multiple sclerosis, or stroke. DIAGNOSIS  To find the cause of your weakness, your caregiver will take your history and perform a physical exam. Lab tests or X-rays may also be ordered, if needed. TREATMENT  Treatment of weakness depends on the cause of your symptoms and can vary  greatly. HOME CARE INSTRUCTIONS   Rest as needed.  Eat a well-balanced diet.  Try to get some exercise every day.  Only take over-the-counter or prescription medicines as directed by your caregiver. SEEK MEDICAL CARE IF:   Your weakness seems to be getting worse or spreads to other parts of your body.  You develop new aches or pains. SEEK IMMEDIATE MEDICAL CARE IF:   You cannot perform your normal daily activities, such as getting dressed and feeding yourself.  You cannot walk up and down stairs, or you feel exhausted when you do so.  You have shortness of breath or chest pain.  You have difficulty moving parts of your body.  You have weakness in only one area of the body or on only one side of the body.  You have a fever.  You have trouble speaking or swallowing.  You cannot control your bladder or bowel movements.  You have black or bloody vomit or stools. MAKE SURE YOU:  Understand these instructions.  Will watch your condition.  Will get help right away if you are not doing well or get worse. Document Released: 02/13/2005 Document Revised: 08/15/2011 Document Reviewed: 04/14/2011 [facility] Patient Information 2015 [place], [place]. This information is not intended to replace advice given to you by your health care provider. Make sure you discuss any questions you have with your health care provider.

## 2014-08-12 NOTE — ED Notes (Signed)
Patient ambulated to restroom.

## 2014-09-05 ENCOUNTER — Encounter (HOSPITAL_BASED_OUTPATIENT_CLINIC_OR_DEPARTMENT_OTHER): Payer: Self-pay | Admitting: *Deleted

## 2014-09-05 ENCOUNTER — Emergency Department (HOSPITAL_BASED_OUTPATIENT_CLINIC_OR_DEPARTMENT_OTHER)
Admission: EM | Admit: 2014-09-05 | Discharge: 2014-09-05 | Disposition: A | Discharge status: Home / Self Care | Payer: Self-pay | Attending: Emergency Medicine | Admitting: Emergency Medicine

## 2014-09-05 DIAGNOSIS — Z79899 Other long term (current) drug therapy: Secondary | ICD-10-CM | POA: Insufficient documentation

## 2014-09-05 DIAGNOSIS — F419 Anxiety disorder, unspecified: Secondary | ICD-10-CM | POA: Insufficient documentation

## 2014-09-05 DIAGNOSIS — Z792 Long term (current) use of antibiotics: Secondary | ICD-10-CM | POA: Insufficient documentation

## 2014-09-05 DIAGNOSIS — F1990 Other psychoactive substance use, unspecified, uncomplicated: Secondary | ICD-10-CM

## 2014-09-05 DIAGNOSIS — Z791 Long term (current) use of non-steroidal anti-inflammatories (NSAID): Secondary | ICD-10-CM | POA: Insufficient documentation

## 2014-09-05 DIAGNOSIS — F319 Bipolar disorder, unspecified: Secondary | ICD-10-CM | POA: Insufficient documentation

## 2014-09-05 DIAGNOSIS — L03113 Cellulitis of right upper limb: Secondary | ICD-10-CM | POA: Insufficient documentation

## 2014-09-05 DIAGNOSIS — Z8719 Personal history of other diseases of the digestive system: Secondary | ICD-10-CM | POA: Insufficient documentation

## 2014-09-05 DIAGNOSIS — Z8739 Personal history of other diseases of the musculoskeletal system and connective tissue: Secondary | ICD-10-CM | POA: Insufficient documentation

## 2014-09-05 DIAGNOSIS — Z87448 Personal history of other diseases of urinary system: Secondary | ICD-10-CM | POA: Insufficient documentation

## 2014-09-05 DIAGNOSIS — T8029XA Infection following other infusion, transfusion and therapeutic injection, initial encounter: Secondary | ICD-10-CM | POA: Insufficient documentation

## 2014-09-05 LAB — BASIC METABOLIC PANEL
Anion gap: 9 (ref 5–15)
BUN: 11 mg/dL (ref 6–20)
CO2: 24 mmol/L (ref 22–32)
Calcium: 8.6 mg/dL — ABNORMAL LOW (ref 8.9–10.3)
Chloride: 98 mmol/L — ABNORMAL LOW (ref 101–111)
Creatinine, Ser: 0.96 mg/dL (ref 0.61–1.24)
GFR calc Af Amer: >60 mL/min (ref >60)
GFR calc non Af Amer: >60 mL/min (ref >60)
Glucose, Bld: 104 mg/dL — ABNORMAL HIGH (ref 65–99)
Potassium: 4.3 mmol/L (ref 3.5–5.1)
Sodium: 131 mmol/L — ABNORMAL LOW (ref 135–145)

## 2014-09-05 LAB — CBC WITH DIFFERENTIAL/PLATELET
BASOS ABS: 0 10*3/uL (ref 0.0–0.1)
Basophils Relative: 0 % (ref 0–1)
EOS PCT: 1 % (ref 0–5)
Eosinophils Absolute: 0.1 10*3/uL (ref 0.0–0.7)
HCT: 35.8 % — ABNORMAL LOW (ref 39.0–52.0)
Hemoglobin: 12 g/dL — ABNORMAL LOW (ref 13.0–17.0)
Lymphocytes Relative: 10 % — ABNORMAL LOW (ref 12–46)
Lymphs Abs: 1 10*3/uL (ref 0.7–4.0)
MCH: 30.3 pg (ref 26.0–34.0)
MCHC: 33.5 g/dL (ref 30.0–36.0)
MCV: 90.4 fL (ref 78.0–100.0)
MONOS PCT: 10 % (ref 3–12)
Monocytes Absolute: 0.9 10*3/uL (ref 0.1–1.0)
NEUTROS PCT: 79 % — AB (ref 43–77)
Neutro Abs: 7.7 10*3/uL (ref 1.7–7.7)
Platelets: 277 10*3/uL (ref 150–400)
RBC: 3.96 MIL/uL — ABNORMAL LOW (ref 4.22–5.81)
RDW: 12.5 % (ref 11.5–15.5)
WBC: 9.7 10*3/uL (ref 4.0–10.5)

## 2014-09-05 LAB — I-STAT CG4 LACTIC ACID, ED: LACTIC ACID, VENOUS: 1.77 mmol/L (ref 0.5–2.0)

## 2014-09-05 MED ORDER — VANCOMYCIN HCL IN DEXTROSE 1-5 GM/200ML-% IV SOLN
1000.0000 mg | Freq: Once | INTRAVENOUS | Status: AC
  Administered 2014-09-05: 1000 mg via INTRAVENOUS
  Filled 2014-09-05: qty 200

## 2014-09-05 MED ORDER — CEPHALEXIN 500 MG PO CAPS: 500.0000 mg | ORAL_CAPSULE | Freq: Four times a day (QID) | ORAL | Status: DC

## 2014-09-05 MED ORDER — SULFAMETHOXAZOLE-TRIMETHOPRIM 800-160 MG PO TABS
1.0000 | ORAL_TABLET | Freq: Two times a day (BID) | ORAL | Status: DC
Start: 2014-09-05 — End: 2014-10-11

## 2014-09-05 MED ORDER — SODIUM CHLORIDE 0.9 % IV BOLUS (SEPSIS)
1000.0000 mL | Freq: Once | INTRAVENOUS | Status: AC
  Administered 2014-09-05: 1000 mL via INTRAVENOUS

## 2014-09-05 MED ORDER — ACETAMINOPHEN 500 MG PO TABS
1000.0000 mg | ORAL_TABLET | Freq: Once | ORAL | Status: AC
  Administered 2014-09-05: 1000 mg via ORAL
  Filled 2014-09-05: qty 2

## 2014-09-05 NOTE — ED Notes (Signed)
Father at BS.

## 2014-09-05 NOTE — ED Notes (Signed)
Pt alert, NAD, calm, interactive. "feel a little better", still reports high pain level. EDPA in to see pt at time of d/c for team d/c. Denies needs or questions, given Rx x2. Steady gait. Father in w/r.

## 2014-09-05 NOTE — ED Notes (Signed)
[facility] [place]

## 2014-09-05 NOTE — Discharge Instructions (Signed)
If you see signs of worsening infection (warmth, redness, tenderness, pus, sharp increase in pain, fever, red streaking) immediately return to the emergency department.  Take your antibiotics as directed and to completion. You should never have any leftover antibiotics! Push fluids and stay well hydrated.   Do not hesitate to return to the emergency room for any new, worsening or concerning symptoms.  Please obtain primary care using resource guide below. Let them know that you were seen in the emergency room and that they will need to obtain records for further outpatient management.    Emergency Department Resource Guide 1) Find a Doctor and Pay Out of Pocket Although you won't have to find out who is covered by your insurance plan, it is a good idea to ask around and get recommendations. You will then need to call the office and see if the doctor you have chosen will accept you as a new patient and what types of options they offer for patients who are self-pay. Some doctors offer discounts or will set up payment plans for their patients who do not have insurance, but you will need to ask so you aren't surprised when you get to your appointment.  2) Contact Your Local Health Department Not all health departments have doctors that can see patients for sick visits, but many do, so it is worth a call to see if yours does. If you don't know where your local health department is, you can check in your phone book. The CDC also has a tool to help you locate your state's health department, and many state websites also have listings of all of their local health departments.  3) Find a Walk-in Clinic If your illness is not likely to be very severe or complicated, you may want to try a walk in clinic. These are popping up all over the country in pharmacies, drugstores, and shopping centers. They're usually staffed by nurse practitioners or physician assistants that have been trained to treat common  illnesses and complaints. They're usually fairly quick and inexpensive. However, if you have serious medical issues or chronic medical problems, these are probably not your best option.  No Primary Care Doctor: - Call Health Connect at  [phone] - they can help you locate a primary care doctor that  accepts your insurance, provides certain services, etc. - Physician Referral Service- [phone]  Chronic Pain Problems: Organization         Address  Phone   Notes  [facility]  [phone] Patients need to be referred by their primary care doctor.   Medication Assistance: Organization         Address  Phone   Notes  [facility] Medication [facility] [address] [place], [place] [address] [phone] --Must be a resident of [facility] -- Must have NO insurance coverage whatsoever (no Medicaid/ Medicare, etc.) -- The pt. MUST have a primary care doctor that directs their care regularly and follows them in the community   [facility]  [phone]   [employer]  [phone]    Agencies that provide inexpensive medical care: Organization         Address  Phone   Notes  [facility]  [phone]   [facility]    [phone]   [facility] [address] [place], [place] [address] [phone]   [facility] [address] [place] [place] (  [phone]) [phone]   Planned Parenthood    [phone]) [phone]   [facility]    [phone]) [phone]   [facility]  [address], [place] Phone:  [phone]) [phone], Fax:  [phone] Hours of Operation:  9 am - 6 pm, M-F.  Also accepts Medicaid/Medicare and self-pay.  [facility]  [address], [place] Phone: [phone]) [phone], Fax: [phone]) [phone]. Hours of Operation:  8:30 am - 5:30 pm, M-F.  Also accepts Medicaid and self-pay.  [facility] [place]  [address], [place] Phone: [phone](336) [phone]   [facility] [address], [place], [place] [phone], Ext. [phone] Mondays & Thursdays: 7-9 AM.  First 15 patients are seen on a first come, first serve basis.    Medicaid-accepting [facility] Providers:  Organization         Address  Phone   Notes  [facility] [address], [place] [phone]) [phone] Also accepts self-pay patients.  [facility] [address], [place]  [phone]) [phone]   [facility] [address], [place] [phone]) [phone]   [facility] [address] [place] [address], [place] [phone]) [phone]   Renaye RakersVeita Bland [address], [place]   [phone]) [phone] Only accepts [place] Access [place] patients after they have their name applied to their card.   Self-Pay (no insurance) in [facility]:  Organization         Address  Phone   Notes  Sickle Cell Patients, [facility] [address], [place] [phone](336) [phone]   [facility] [address], [place] [phone](336) [phone]   [facility]  [address], [place] [phone]) [phone]   [facility]/Dr. Osei-Bonsu  [address] [place] [address], [place] or [phone], [place] [phone] Phone number for both [place] and [place] locations is the same.  [facility] [address], [place] [phone](336) [phone]   [facility] [place] [address] [place] [address], [place] or [address] [phone](336) [phone]   [facility] [address], [place] [phone]) [phone], phone; [phone]) [phone], fax Sees patients 1st and 3rd Saturday of every month.  Must not qualify for public or private insurance (i.e. Medicaid, Medicare, [place] Health Choice, Veterans' Benefits)  Household income should be no more than 200% of the  poverty level The clinic cannot treat you if you are pregnant or think you are pregnant  Sexually transmitted diseases are not treated at the clinic.    Dental Care: Organization         Address  Phone  Notes  [facility] of [facility] [address], [place] [phone] Accepts children up to age 49 who are enrolled in [place] or [place] Health Choice; pregnant women with a Medicaid card; and children who have applied for Medicaid or [place] Health Choice, but were declined, whose parents can pay a reduced fee at time of service.  [facility] of [facility] [place]  [address], [place] [phone]) [phone] Accepts children up to age 49 who are enrolled in [place] or [place] Health Choice; pregnant women with a Medicaid card; and children who have applied for Medicaid or [place] Health  Choice, but were declined, whose parents can pay a reduced fee at time of service.  [facility]  [address] [place], [place] [phone] Patients are seen by appointment only. Walk-ins are not accepted. [facility] will see patients 49 years of age and older. Monday - Tuesday (8am-5pm) Most Wednesdays (8:30-5pm) $30 per visit, cash only  [facility] Adult [facility] Access [facility]  [address], [facility] [phone] Patients are seen by appointment only. Walk-ins are not accepted. [facility] will see patients 23 years of age and older. One Wednesday Evening (Monthly: Volunteer Based).  $30 per visit, cash only  [employer]  [phone] for adults; Children under age 56, call Graduate Pediatric Dentistry at [phone]. Children aged 61-14, please call [phone] to request a pediatric application.  Dental services are provided in all areas of dental care including fillings, crowns and bridges, complete and partial dentures, implants, gum treatment, root canals, and extractions.  Preventive care is also provided. Treatment is provided to both adults and children. Patients are selected via a lottery and there is often a waiting list.   [facility] [address], [place]  [phone] [email or web address]   [facility] [address] [place], [place] [phone], Ext. [phone] Second and Fourth Thursday of each month, opens at 6:30 AM; Clinic ends at 9 AM.  Patients are seen on a first-come first-served basis, and a limited number are seen during each clinic.   [facility]  [address] [place], [place] [phone]   Eligibility Requirements You must have lived in [place], [place], or [place] counties for at least the last three months.   You cannot be eligible for state or federal sponsored [place], including [employer], [place], or [employer].   You generally cannot be eligible for healthcare insurance through your employer.    How to apply: Eligibility screenings are held every Tuesday and Wednesday afternoon from 1:00 pm until 4:00 pm. You do not need an appointment for the interview!  [facility] [address], [place], [place] [phone]   [facility] Health Department  [phone]   [facility] Health Department  [phone]   [facility] Health Department  [phone]    Behavioral Health Resources in the Community: Intensive Outpatient Programs Organization         Address  Phone  Notes  [facility] [address] [place], [place] [phone]   [facility] [address], [place], [place] [phone]   [facility] [address] [place], [place]  [phone]   [facility] [address]  [place], [place] [phone] or [phone]   Substance Abuse Resources Organization         Address  Phone  Notes  Al[facility] Drug Services  [phone]   Addiction  [facility]  [phone]   [facility]  [phone]   Floydene Flock  [phone]   Residential & Outpatient Substance Abuse Program  [phone]   Psychological Services Organization         Address  Phone  Notes  [facility] Behavior[facility] Health  [phone]   [facility]  [phone]   [facility] Mental Health [address], [place] [phone] or [phone]    Mobile Crisis Teams Organization         Address  Phone  Notes  [facility], Mobile Crisis Care Unit  [phone]   [facility]  [address] [place], [place] [phone]   [facility] [address] [place] [phone]    Self-Help/Support Groups Organization         Address  Phone             Notes  [facility] Assoc[facility] of [place] - variety of support groups  [phone]- [phone] Call for more information  Narcotics Anonymous (NA), [facility] [address], [employer] [place]  2 meetings at this location   Statistician         Address  Phone  Notes  [facility] [address],    [place]  [phone]   [facility]  [address] [place] [address], [place], [place] [phone]   [facility] [address] [place], [place] [phone] Admissions: 8am-3pm M-F  [facility] [address]    [place], [place] [phone]   [facility] [address] [place], [place], [place] [phone]   [facility] [address]  [place], [place] [phone]   [facility] - Intensive Outpatient [address], [place], [place] [phone]   [facility] ([facility] [address]  [place], [place] [phone] or [phone]   [facility] (RT[facility]) [address] [place], [place] [phone] Accepts Medicaid  [facility] [place] [address],  [place]  [phone] Substance Abuse/Addiction Treatment   [facility] Organization         Address  Phone  Notes  [facility]  [phone]   Angie Fava, PhD [address] [place], [place]   [phone] or [phone]   [facility] Behavioral   [address] [place], [place] [phone]   [facility] [address], [place], [place] [phone] Insurance/Medicaid/sponsorship through [facility] and Families [address]                                    [place], [place] [phone] Therapy/tele-psych/case  [facility] [address], [place] [phone]    Dr. Lolly Mustache  [phone]   [facility]  [employer] [facility] Dept. 1) [address], [place] 2) [address], [place] 3)  [address], [place] [phone]  [phone]   [facility] Child Abuse Hotline 570-134-9511 or 818-052-9282 (After Hours)

## 2014-09-05 NOTE — ED Notes (Signed)
Pt admits to IV drug use with relapse yesterday. Pt reports he injected heroin in his right AC yesterday- arm is red and swollen and difficult to straighten- states he felt bad after injecting and the supplier told him it may have been cut with fentanyl- states he has been feeling sick all day

## 2014-09-05 NOTE — ED Notes (Signed)
EDPA into room, at BS.  

## 2014-09-05 NOTE — ED Provider Notes (Signed)
CSN: [id number]     Arrival date & time 09/05/14  2016 History   First MD Initiated Contact with Patient 09/05/14 2039     Chief Complaint  Patient presents with  . Wound Infection     (Consider location/radiation/quality/duration/timing/severity/associated sxs/prior Treatment) HPI  Blood pressure 114/54, pulse 96, temperature 98.8 F (37.1 C), temperature source Oral, resp. rate 20, height  (1.905 m), weight 238 lb (107.956 kg), SpO2 95 %.  Brandon [name]. is a 49 y.o. male complaining of infection at injection site to right antecubital area. Patient states that he injected 3 times yesterday, states that he's been clean for 2 years. Patient states the pain is severe, 10 out of 10, states he hasn't used today. Patient denies fever, chills, chest pain, cervicalgia. She has been feeling nauseated all day today. States the pain is exacerbated when he bends the elbow.  Past Medical History  Diagnosis Date  . Drug abuse   . Kidney failure   . Bipolar 1 disorder   . Rhabdomyolysis   . Acute renal failure   . Opiate abuse, continuous   . Dental caries     extensive  . Anxiety   . Depression   . Bipolar affective disorder 03/17/2012  . Mood disorder 03/16/2012   Past Surgical History  Procedure Laterality Date  . Knee surgery      x 5  . Hernia repair     Family History  Problem Relation Age of Onset  . Heart disease Mother   . Heart disease Father    History  Substance Use Topics  . Smoking status: Never Smoker   . Smokeless tobacco: Never Used  . Alcohol Use: No    Review of Systems  10 systems reviewed and found to be negative, except as noted in the HPI.   Allergies  Other; Contrast media; and Hydrocodone  Home Medications   Prior to Admission medications   Medication Sig Start Date End Date Taking? Authorizing Provider  Zolpidem Tartrate (AMBIEN PO) Take by mouth.   Yes Historical Provider, MD  doxycycline (VIBRAMYCIN) 100 MG capsule Take 1 capsule (100  mg total) by mouth 2 (two) times daily. 08/12/14   Gilda Crease, MD  hydrOXYzine (ATARAX/VISTARIL) 50 MG tablet Take 1 tablet (50 mg total) by mouth every 6 (six) hours as needed for anxiety. 03/21/14   Adonis Brook, NP  lurasidone (LATUDA) 40 MG TABS tablet Take 40 mg by mouth daily with breakfast.    Historical Provider, MD  naproxen (NAPROSYN) 500 MG tablet Take 1 tablet (500 mg total) by mouth 2 (two) times daily. 05/07/14   Vanetta Mulders, MD  OXYCODONE PO Take by mouth.    Historical Provider, MD  traMADol (ULTRAM) 50 MG tablet Take 1 tablet (50 mg total) by mouth every 6 (six) hours as needed for severe pain. 03/21/14   Adonis Brook, NP  ziprasidone (GEODON) 40 MG capsule Take 1 capsule (40 mg total) by mouth 2 (two) times daily with a meal. 03/21/14   Adonis Brook, NP   BP 114/54 mmHg  Pulse 96  Temp(Src) 98.8 F (37.1 C) (Oral)  Resp 20  Ht  (1.905 m)  Wt 238 lb (107.956 kg)  BMI 29.75 kg/m2  SpO2 95% Physical Exam  Constitutional: He is oriented to person, place, and time. He appears well-developed and well-nourished. No distress.  HENT:  Head: Normocephalic.  Eyes: Conjunctivae and EOM are normal. Pupils are equal, round, and reactive to light.  Cardiovascular: Normal rate.   No murmur heard. Pulmonary/Chest: Effort normal and breath sounds normal. No stridor.  Abdominal: Soft.  Musculoskeletal: Normal range of motion.  Neurological: He is alert and oriented to person, place, and time.  Skin: Rash noted.  10 cm area of induration to right antecubital area there are several puncture wounds to the area. No fluctuance. Exquisitely tender to palpation.  Psychiatric:  Jittery, anxious  Nursing note and vitals reviewed.         ED Course  Procedures (including critical care time) EMERGENCY DEPARTMENT US SOFT TISSUE INTERPRETATION "Study: Limited Ultrasound of the noted body part in comments below"  INDICATIONS: Soft tissue infection Multiple views  of the body part are obtained with a multi-frequency linear probe  PERFORMED BY:  Myself  IMAGES ARCHIVED?: Yes  SIDE:Right   BODY PART:Upper extremity  FINDINGS: Cellulitis present  INTERPRETATION:  Cellulitis present     Labs Review Labs Reviewed  CBC WITH DIFFERENTIAL/PLATELET - Abnormal; Notable for the following:    RBC 3.96 (*)    Hemoglobin 12.0 (*)    HCT 35.8 (*)    Neutrophils Relative % 79 (*)    Lymphocytes Relative 10 (*)    All other components within normal limits  BASIC METABOLIC PANEL - Abnormal; Notable for the following:    Sodium 131 (*)    Chloride 98 (*)    Glucose, Bld 104 (*)    Calcium 8.6 (*)    All other components within normal limits  I-STAT CG4 LACTIC ACID, ED    Imaging Review No results found.   EKG Interpretation None      MDM   Final diagnoses:  Cellulitis of right upper extremity  IVDU (intravenous drug user)   Filed Vitals:   09/05/14 2026 09/05/14 2145  BP: 114/54 122/65  Pulse: 96 76  Temp: 98.8 F (37.1 C)   TempSrc: Oral   Resp: 20   Height: 6\' 3"  (1.905 m)   Weight: 238 lb (107.956 kg)   SpO2: 95% 96%    Medications  sodium chloride 0.9 % bolus 1,000 mL (0 mLs Intravenous Stopped 09/05/14 2317)  vancomycin (VANCOCIN) IVPB 1000 mg/200 mL premix (0 mg Intravenous Stopped 09/05/14 2317)  acetaminophen (TYLENOL) tablet 1,000 mg (1,000 mg Oral Given 09/05/14 2149)    Brandon [name]. is a pleasant 49 y.o. male presenting with cellulitis to right antecubital area, patient injected heroin yesterday. Ultrasound with no focal abscess. Patient given vancomycin in the ED. Normal white blood cell count and lactic acid. Area is outlined and extensive discussion of return precautions.  Patient states that he doesn't have money to afford antibiotics until Friday. States that he has half a bottle of doxycycline leftover from last visit for similar cellulitis he states he only took half the bottle. Advised him that he should take  it as directed and he can fill the Keflex and Bactrim when he is paid. We've had an extensive discussion of return precautions patient verbalizes understanding.  Evaluation does not show pathology that would require ongoing emergent intervention or inpatient treatment. Pt is hemodynamically stable and mentating appropriately. Discussed findings and plan with patient/guardian, who agrees with care plan. All questions answered. Return precautions discussed and outpatient follow up given.   New Prescriptions   CEPHALEXIN (KEFLEX) 500 MG CAPSULE    Take 1 capsule (500 mg total) by mouth 4 (four) times daily.   SULFAMETHOXAZOLE-TRIMETHOPRIM (BACTRIM DS) 800-160 MG PER TABLET    Take 1 tablet by  mouth 2 (two) times daily.       Wynetta Emeryicole Dionysios Massman, PA-C 09/05/14 2340  Geoffery Lyonsouglas Delo, MD 09/08/14 [phone]

## 2014-09-05 NOTE — ED Notes (Signed)
Sleeping resting, NAD, calm, vanc infusing, "hanging in there". Denies needs at this time.

## 2014-09-07 ENCOUNTER — Telehealth: Payer: Self-pay | Admitting: *Deleted

## 2014-09-07 NOTE — Telephone Encounter (Signed)
Received a call from this patient asking if he needs to get his arm lanced after being seen in the ED for infection and it is not getting better.  Left message for patient on his personal machine  that we dont have a patient by this name and to call his primary care physician to discuss this with him

## 2014-09-10 IMAGING — CR DG RIBS W/ CHEST 3+V*R*
4 series · 4 of 4 positions shown · non-contrast
Comparison: 01/17/2010 and earlier.

CLINICAL DATA: 46-year-old male status post fall with right upper
rib pain.

RIGHT RIBS AND CHEST - 3+ VIEW

[w chest pa]
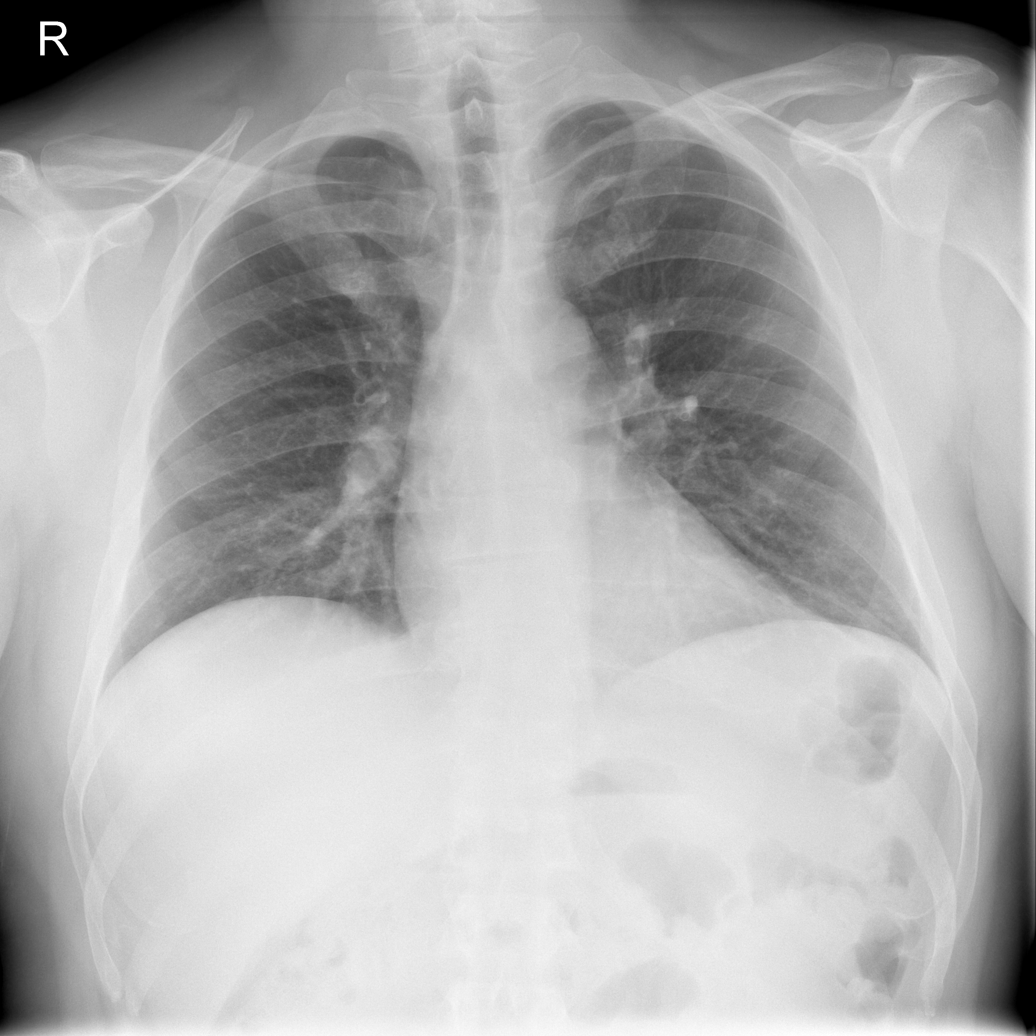

[t ribs ap/pa upper right]
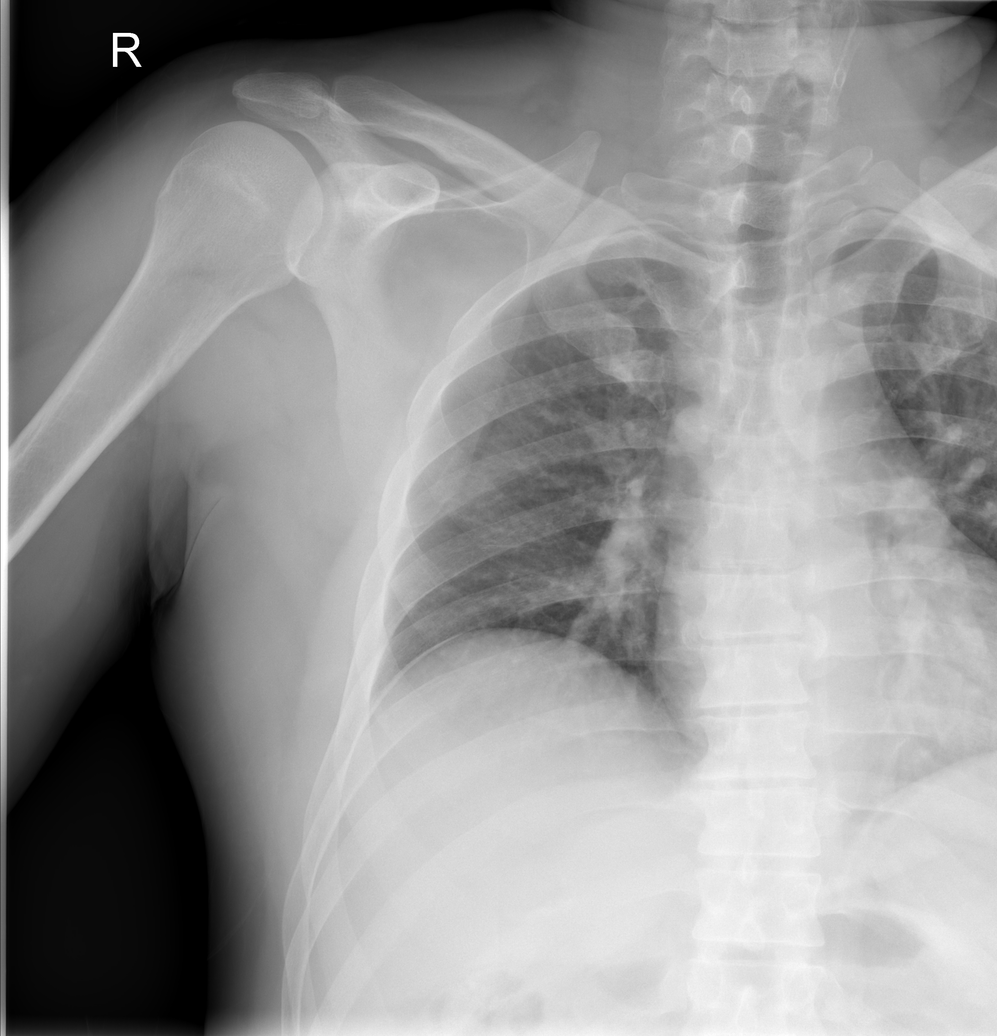

[t ribs ap/pa  lower right]
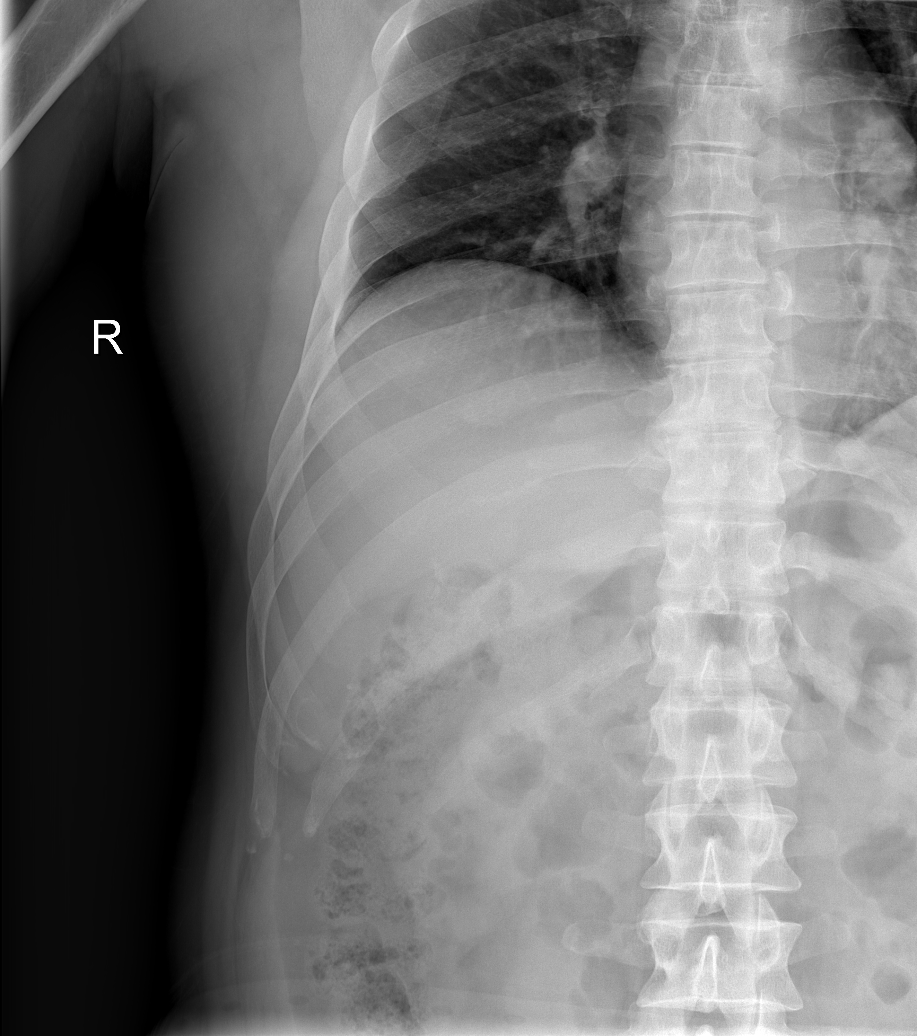

[w ribs oblique right]
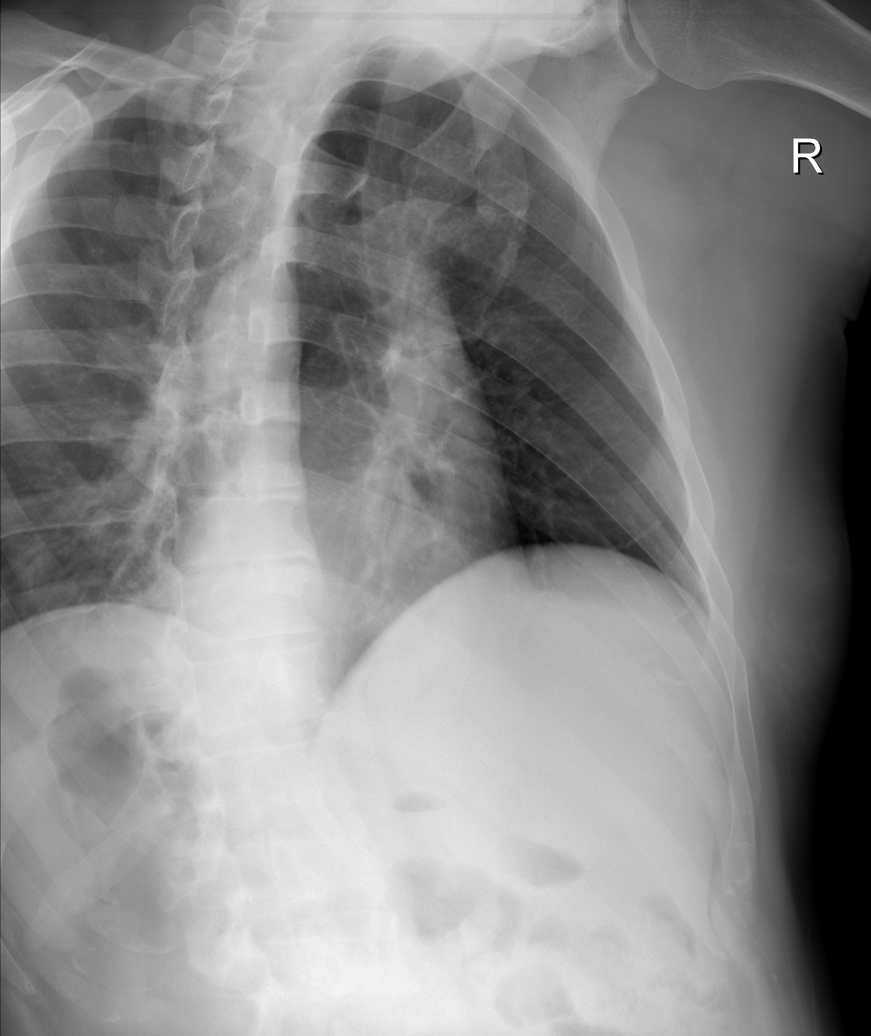

[4 of 4 positions shown; findings below may reference images not displayed]

FINDINGS: Low lung volumes.  Cardiac size and mediastinal contours
are within normal limits.  Visualized tracheal air column is within
normal limits.  No pneumothorax or pleural effusion.  Mild crowding
of basilar lung markings.

Oblique views of the right ribs.  No displaced rib fracture
identified.  Osseous structures about the right shoulder appear
grossly intact.
IMPRESSION: 1. No displaced right rib fracture identified.
2.  Low lung volumes with mild atelectasis.

## 2014-10-04 ENCOUNTER — Emergency Department (HOSPITAL_COMMUNITY)
Admission: EM | Admit: 2014-10-04 | Discharge: 2014-10-04 | Disposition: A | Discharge status: Home / Self Care | Payer: Self-pay | Attending: Emergency Medicine | Admitting: Emergency Medicine

## 2014-10-04 ENCOUNTER — Encounter (HOSPITAL_COMMUNITY): Payer: Self-pay

## 2014-10-04 DIAGNOSIS — Z8719 Personal history of other diseases of the digestive system: Secondary | ICD-10-CM | POA: Insufficient documentation

## 2014-10-04 DIAGNOSIS — F1310 Sedative, hypnotic or anxiolytic abuse, uncomplicated: Secondary | ICD-10-CM | POA: Insufficient documentation

## 2014-10-04 DIAGNOSIS — R000 Tachycardia, unspecified: Secondary | ICD-10-CM | POA: Insufficient documentation

## 2014-10-04 DIAGNOSIS — Z8739 Personal history of other diseases of the musculoskeletal system and connective tissue: Secondary | ICD-10-CM | POA: Insufficient documentation

## 2014-10-04 DIAGNOSIS — F1123 Opioid dependence with withdrawal: Secondary | ICD-10-CM | POA: Insufficient documentation

## 2014-10-04 DIAGNOSIS — N179 Acute kidney failure, unspecified: Secondary | ICD-10-CM | POA: Insufficient documentation

## 2014-10-04 DIAGNOSIS — F419 Anxiety disorder, unspecified: Secondary | ICD-10-CM | POA: Insufficient documentation

## 2014-10-04 LAB — COMPREHENSIVE METABOLIC PANEL
ALBUMIN: 3.2 g/dL — AB (ref 3.5–5.0)
ALT: 59 U/L (ref 17–63)
AST: 104 U/L — ABNORMAL HIGH (ref 15–41)
Alkaline Phosphatase: 86 U/L (ref 38–126)
Anion gap: 10 (ref 5–15)
BUN: 12 mg/dL (ref 6–20)
CO2: 23 mmol/L (ref 22–32)
Calcium: 8.7 mg/dL — ABNORMAL LOW (ref 8.9–10.3)
Chloride: 101 mmol/L (ref 101–111)
Creatinine, Ser: 1.08 mg/dL (ref 0.61–1.24)
GFR calc Af Amer: >60 mL/min (ref >60)
GFR calc non Af Amer: >60 mL/min (ref >60)
Glucose, Bld: 119 mg/dL — ABNORMAL HIGH (ref 65–99)
Potassium: 3.5 mmol/L (ref 3.5–5.1)
Sodium: 134 mmol/L — ABNORMAL LOW (ref 135–145)
Total Bilirubin: 0.8 mg/dL (ref 0.3–1.2)
Total Protein: 6.4 g/dL — ABNORMAL LOW (ref 6.5–8.1)

## 2014-10-04 LAB — CBC
HCT: 35.8 % — ABNORMAL LOW (ref 39.0–52.0)
Hemoglobin: 11.6 g/dL — ABNORMAL LOW (ref 13.0–17.0)
MCH: 28.6 pg (ref 26.0–34.0)
MCHC: 32.4 g/dL (ref 30.0–36.0)
MCV: 88.2 fL (ref 78.0–100.0)
PLATELETS: 128 10*3/uL — AB (ref 150–400)
RBC: 4.06 MIL/uL — ABNORMAL LOW (ref 4.22–5.81)
RDW: 14.8 % (ref 11.5–15.5)
WBC: 3.8 10*3/uL — ABNORMAL LOW (ref 4.0–10.5)

## 2014-10-04 LAB — RAPID URINE DRUG SCREEN, HOSP PERFORMED
Amphetamines: NOT DETECTED
Barbiturates: POSITIVE — AB
Benzodiazepines: NOT DETECTED
Cocaine: NOT DETECTED
Opiates: POSITIVE — AB
TETRAHYDROCANNABINOL: NOT DETECTED

## 2014-10-04 LAB — ETHANOL: Alcohol, Ethyl (B): <5 mg/dL (ref <5)

## 2014-10-04 MED ORDER — CLONIDINE HCL 0.1 MG PO TABS
0.1000 mg | ORAL_TABLET | Freq: Once | ORAL | Status: AC
  Administered 2014-10-04: 0.1 mg via ORAL
  Filled 2014-10-04: qty 1

## 2014-10-04 MED ORDER — CLONIDINE HCL 0.1 MG PO TABS
0.1000 mg | ORAL_TABLET | Freq: Once | ORAL | Status: DC
  Filled 2014-10-04: qty 1

## 2014-10-04 MED ORDER — DIAZEPAM 5 MG PO TABS
5.0000 mg | ORAL_TABLET | Freq: Once | ORAL | Status: AC
  Administered 2014-10-04: 5 mg via ORAL
  Filled 2014-10-04: qty 1

## 2014-10-04 MED ORDER — ACETAMINOPHEN 325 MG PO TABS
650.0000 mg | ORAL_TABLET | Freq: Once | ORAL | Status: AC
  Administered 2014-10-04: 650 mg via ORAL
  Filled 2014-10-04: qty 2

## 2014-10-04 MED ORDER — ONDANSETRON 4 MG PO TBDP
4.0000 mg | ORAL_TABLET | Freq: Once | ORAL | Status: AC
  Administered 2014-10-04: 4 mg via ORAL
  Filled 2014-10-04: qty 1

## 2014-10-04 MED ORDER — IBUPROFEN 800 MG PO TABS
800.0000 mg | ORAL_TABLET | Freq: Once | ORAL | Status: AC
  Administered 2014-10-04: 800 mg via ORAL
  Filled 2014-10-04: qty 1

## 2014-10-04 MED ORDER — CLONIDINE HCL 0.1 MG PO TABS: 0.2000 mg | ORAL_TABLET | Freq: Every day | ORAL | Status: AC

## 2014-10-04 MED ORDER — DIAZEPAM 5 MG PO TABS: 5.0000 mg | ORAL_TABLET | Freq: Four times a day (QID) | ORAL | Status: AC | PRN

## 2014-10-04 MED ORDER — LORAZEPAM 1 MG PO TABS
1.0000 mg | ORAL_TABLET | Freq: Once | ORAL | Status: AC
  Administered 2014-10-04: 1 mg via ORAL
  Filled 2014-10-04: qty 1

## 2014-10-04 NOTE — ED Notes (Signed)
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  He c/o diffuse areas of pain, plus anxiety "can't calm down-I feel like shit".

## 2014-10-04 NOTE — ED Provider Notes (Signed)
CSN: [id number]     Arrival date & time 10/04/14  1149 History   First MD Initiated Contact with Patient 10/04/14 1256     Chief Complaint  Patient presents with  . Drug Problem     (Consider location/radiation/quality/duration/timing/severity/associated sxs/prior Treatment) Patient is a 49 y.o. male presenting with general illness. The history is provided by the patient.  Illness Severity:  Moderate Onset quality:  Sudden Duration:  2 days Timing:  Constant Progression:  Unchanged Chronicity:  New Associated symptoms: no abdominal pain, no chest pain, no congestion, no diarrhea, no fatigue, no fever, no headaches, no myalgias, no rash, no shortness of breath and no vomiting      48 year old male with a chief complaint of heroin withdrawal. Patient last used yesterday feels like he's been very nervous agitated that his heart is racing have vomiting diarrhea and generalized muscle aches. This been going on today. Patient is supposed to follow-up on Wednesday at old [facility] for formal withdrawal. Patient denies symptomatology prior to this morning.  Past Medical History  Diagnosis Date  . Drug abuse   . Kidney failure   . Bipolar 1 disorder   . Rhabdomyolysis   . Acute renal failure   . Opiate abuse, continuous   . Dental caries     extensive  . Anxiety   . Depression   . Bipolar affective disorder 03/17/2012  . Mood disorder 03/16/2012   Past Surgical History  Procedure Laterality Date  . Knee surgery      x 5  . Hernia repair     Family History  Problem Relation Age of Onset  . Heart disease Mother   . Heart disease Father    History  Substance Use Topics  . Smoking status: Never Smoker   . Smokeless tobacco: Never Used  . Alcohol Use: No    Review of Systems  Constitutional: Negative for fever, chills and fatigue.  HENT: Negative for congestion and facial swelling.   Eyes: Negative for discharge and visual disturbance.  Respiratory: Negative for shortness of  breath.   Cardiovascular: Negative for chest pain and palpitations.  Gastrointestinal: Negative for vomiting, abdominal pain and diarrhea.  Musculoskeletal: Negative for myalgias and arthralgias.  Skin: Negative for color change and rash.  Neurological: Positive for tremors. Negative for syncope and headaches.  Psychiatric/Behavioral: Positive for agitation. Negative for confusion and dysphoric mood. The patient is nervous/anxious.       Allergies  Other; Contrast media; and Hydrocodone  Home Medications   Prior to Admission medications   Medication Sig Start Date End Date Taking? Authorizing Provider  buprenorphine (SUBUTEX) 2 MG SUBL SL tablet Place 2 mg under the tongue daily as needed (for symptoms).  09/12/14  Yes Historical Provider, MD  zolpidem (AMBIEN) 10 MG tablet Take 10 mg by mouth at bedtime as needed for sleep.    Yes Historical Provider, MD  cephALEXin (KEFLEX) 500 MG capsule Take 1 capsule (500 mg total) by mouth 4 (four) times daily. Patient not taking: Reported on 10/04/2014 09/05/14   Joni Reining Pisciotta, PA-C  doxycycline (VIBRAMYCIN) 100 MG capsule Take 1 capsule (100 mg total) by mouth 2 (two) times daily. Patient not taking: Reported on 10/04/2014 08/12/14   Gilda Crease, MD  sulfamethoxazole-trimethoprim (BACTRIM DS) 800-160 MG per tablet Take 1 tablet by mouth 2 (two) times daily. Patient not taking: Reported on 10/04/2014 09/05/14   Joni Reining Pisciotta, PA-C   BP 109/48 mmHg  Pulse 87  Temp(Src) 98.3 F (36.8 C) (  Oral)  Resp 22  SpO2 99% Physical Exam  Constitutional: He is oriented to person, place, and time. He appears well-developed and well-nourished.  HENT:  Head: Normocephalic and atraumatic.  Eyes: EOM are normal. Pupils are equal, round, and reactive to light.  Neck: Normal range of motion. Neck supple. No JVD present.  Cardiovascular: Regular rhythm.  Tachycardia present.  Exam reveals no gallop and no friction rub.   No murmur  heard. Pulmonary/Chest: No respiratory distress. He has no wheezes.  Abdominal: He exhibits no distension. There is no rebound and no guarding.  Musculoskeletal: Normal range of motion.  Neurological: He is alert and oriented to person, place, and time.  Skin: No rash noted. No pallor.  Psychiatric: He has a normal mood and affect. His behavior is normal.    ED Course  Procedures (including critical care time) Labs Review Labs Reviewed  COMPREHENSIVE METABOLIC PANEL  ETHANOL  CBC  URINE RAPID DRUG SCREEN, HOSP PERFORMED    Imaging Review No results found.   EKG Interpretation None      MDM   Final diagnoses:  None    49 year old male with a chief complaint of opiate withdrawal. Clinically appears to be in opiate withdrawal. We'll give clonidine Ativan and reassess.  Patient given two doses of clonidine, ativan, tylenol, motrin, valium, with improvement of symptoms.  Discussed with patient, elects to try to go home.    6:14 PM:  I have discussed the diagnosis/risks/treatment options with the patient and family and believe the pt to be eligible for discharge home to follow-up with PCP/opiate withdrawal clinic. We also discussed returning to the ED immediately if new or worsening sx occur. We discussed the sx which are most concerning (e.g., inability to eat or drink) that necessitate immediate return. Medications administered to the patient during their visit and any new prescriptions provided to the patient are listed below.  Medications given during this visit Medications  cloNIDine (CATAPRES) tablet 0.1 mg (0.1 mg Oral Not Given 10/04/14 1511)  cloNIDine (CATAPRES) tablet 0.1 mg (0.1 mg Oral Given 10/04/14 1357)  LORazepam (ATIVAN) tablet 1 mg (1 mg Oral Given 10/04/14 1357)  ondansetron (ZOFRAN-ODT) disintegrating tablet 4 mg (4 mg Oral Given 10/04/14 1357)  acetaminophen (TYLENOL) tablet 650 mg (650 mg Oral Given 10/04/14 1504)  ibuprofen (ADVIL,MOTRIN) tablet 800 mg (800 mg  Oral Given 10/04/14 1504)  diazepam (VALIUM) tablet 5 mg (5 mg Oral Given 10/04/14 1532)    Discharge Medication List as of 10/04/2014  3:17 PM    START taking these medications   Details  cloNIDine (CATAPRES) 0.1 MG tablet Take 2 tablets (0.2 mg total) by mouth daily., Starting 10/04/2014, Until Discontinued, Print    diazepam (VALIUM) 5 MG tablet Take 1 tablet (5 mg total) by mouth every 6 (six) hours as needed for anxiety (spasms)., Starting 10/04/2014, Until Discontinued, Print         The patient appears reasonably screen and/or stabilized for discharge and I doubt any other medical condition or other [facility] requiring further screening, evaluation, or treatment in the ED at this time prior to discharge.    Melene Plan, DO 10/04/14 1815

## 2014-10-04 NOTE — ED Notes (Signed)
He requested to "wait about ten minutes" after receiving Valium.  Will check and plan to d/c shortly.  He continues to be calmer and to feel better than when he arrived.

## 2014-10-04 NOTE — ED Notes (Signed)
Bed: RU04 Expected date:  Expected time:  Means of arrival:  Comments: Seizures/Heroin abuse

## 2014-10-04 NOTE — ED Notes (Signed)
Pt eating dinner tray in room

## 2014-10-04 NOTE — ED Notes (Signed)
He also tells [place] he has been in a few drug treatment facilities, and thus far has been unable to stop his substance abuse.  He also tells [place] he took 10 (ten) o.t.c. Loperamide tablets upon the advisement of a friend who told him it "would help my withdrawal symptoms".  He rec'd. Zofran 4 mg IV en route to hospital.  He is oriented x 4 with clear speech.

## 2014-10-04 NOTE — Discharge Instructions (Signed)
Opioid Withdrawal  Opioids are a group of narcotic drugs. They include the street drug heroin. They also include pain medicines, such as morphine, hydrocodone, oxycodone, and fentanyl. Opioid withdrawal is a group of characteristic physical and mental signs and symptoms. It typically occurs if you have been using opioids daily for several weeks or longer and stop using or rapidly decrease use. Opioid withdrawal can also occur if you have used opioids daily for a long time and are given a medicine to block the effect.   SIGNS AND SYMPTOMS  Opioid withdrawal includes three or more of the following symptoms:   · Depressed, anxious, or irritable mood.  · Nausea or vomiting.  · Muscle aches or spasms.    · Watery eyes.     · Runny nose.  · Dilated pupils, sweating, or hairs standing on end.  · Diarrhea or intestinal cramping.  · Yawning.    · Fever.  · Increased blood pressure.  · Fast pulse.  · Restlessness or trouble sleeping.  These signs and symptoms occur within several hours of stopping or reducing short-acting opioids, such as heroin. They can occur within 3 days of stopping or reducing long-acting opioids, such as methadone. Withdrawal begins within minutes of receiving a drug that blocks the effects of opioids, such as naltrexone or naloxone.  DIAGNOSIS   Opioid use disorder is diagnosed by your health care provider. You will be asked about your symptoms, drug and alcohol use, medical history, and use of medicines. A physical exam may be done. Lab tests may be ordered. Your health care provider may have you see a mental health professional.   TREATMENT   The treatment for opioid withdrawal is usually provided by medical doctors with special training in substance use disorders (addiction specialists). The following medicines may be included in treatment:  · Opioids given in place of the abused opioid. They turn on opioid receptors in the brain and lessen or prevent withdrawal symptoms. They are gradually  decreased (opioid substitution and taper).  · Non-opioids that can lessen certain opioid withdrawal symptoms. They may be used alone or with opioid substitution and taper.  Successful long-term recovery usually requires medicine, counseling, and group support.  HOME CARE INSTRUCTIONS   · Take medicines only as directed by your health care provider.  · Check with your health care provider before starting new medicines.  · Keep all follow-up visits as directed by your health care provider.  SEEK MEDICAL CARE IF:  · You are not able to take your medicines as directed.  · Your symptoms get worse.  · You relapse.  SEEK IMMEDIATE MEDICAL CARE IF:  · You have serious thoughts about hurting yourself or others.  · You have a seizure.  · You lose consciousness.  Document Released: 02/16/2003 Document Revised: 06/30/2013 Document Reviewed: 02/26/2013  Exit[employer]® Patient Information ©2015 [employer]. This information is not intended to replace advice given to you by your health care provider. Make sure you discuss any questions you have with your health care provider.

## 2014-10-04 NOTE — ED Notes (Signed)
He remains jittery and anxious, but less so than when he arrived. His father is now with him and he thanks me for the meds I just gave him.  I have just been informed that his labs need re-drawn and main lab phlebotomy has been notified to do this.

## 2014-10-09 ENCOUNTER — Emergency Department (HOSPITAL_BASED_OUTPATIENT_CLINIC_OR_DEPARTMENT_OTHER)
Admission: EM | Admit: 2014-10-09 | Discharge: 2014-10-09 | Disposition: A | Discharge status: Home / Self Care | Payer: Self-pay | Attending: Emergency Medicine | Admitting: Emergency Medicine

## 2014-10-09 ENCOUNTER — Encounter (HOSPITAL_BASED_OUTPATIENT_CLINIC_OR_DEPARTMENT_OTHER): Payer: Self-pay

## 2014-10-09 DIAGNOSIS — Z79899 Other long term (current) drug therapy: Secondary | ICD-10-CM | POA: Insufficient documentation

## 2014-10-09 DIAGNOSIS — Z87448 Personal history of other diseases of urinary system: Secondary | ICD-10-CM | POA: Insufficient documentation

## 2014-10-09 DIAGNOSIS — F319 Bipolar disorder, unspecified: Secondary | ICD-10-CM | POA: Insufficient documentation

## 2014-10-09 DIAGNOSIS — L03116 Cellulitis of left lower limb: Secondary | ICD-10-CM | POA: Insufficient documentation

## 2014-10-09 DIAGNOSIS — F39 Unspecified mood [affective] disorder: Secondary | ICD-10-CM | POA: Insufficient documentation

## 2014-10-09 DIAGNOSIS — F419 Anxiety disorder, unspecified: Secondary | ICD-10-CM | POA: Insufficient documentation

## 2014-10-09 DIAGNOSIS — Z8719 Personal history of other diseases of the digestive system: Secondary | ICD-10-CM | POA: Insufficient documentation

## 2014-10-09 MED ORDER — CLONIDINE HCL 0.1 MG PO TABS
0.1000 mg | ORAL_TABLET | Freq: Once | ORAL | Status: AC
  Administered 2014-10-09: 0.1 mg via ORAL
  Filled 2014-10-09: qty 1

## 2014-10-09 MED ORDER — CLINDAMYCIN HCL 150 MG PO CAPS: 450.0000 mg | ORAL_CAPSULE | Freq: Three times a day (TID) | ORAL | Status: DC

## 2014-10-09 MED ORDER — CLINDAMYCIN HCL 150 MG PO CAPS
450.0000 mg | ORAL_CAPSULE | Freq: Once | ORAL | Status: AC
  Administered 2014-10-09: 450 mg via ORAL
  Filled 2014-10-09: qty 3

## 2014-10-09 NOTE — ED Provider Notes (Signed)
CSN: [id number]     Arrival date & time 10/09/14  1016 History   First MD Initiated Contact with Patient 10/09/14 1033     Chief Complaint  Patient presents with  . Cellulitis     (Consider location/radiation/quality/duration/timing/severity/associated sxs/prior Treatment) Patient is a 49 y.o. male presenting with rash.  Rash Location:  Foot Foot rash location:  L foot Quality: painful and redness   Pain details:    Quality:  Hot   Severity:  Severe   Onset quality:  Gradual   Duration:  2 days   Timing:  Constant   Progression:  Worsening Context comment:  Injecting drugs into his foot Relieved by:  Nothing Exacerbated by: Touch. Ineffective treatments:  None tried Associated symptoms comment:  "Going through serious withdrawals" - patient is currently an inpatient drug treatment program.   Past Medical History  Diagnosis Date  . Drug abuse   . Kidney failure   . Bipolar 1 disorder   . Rhabdomyolysis   . Acute renal failure   . Opiate abuse, continuous   . Dental caries     extensive  . Anxiety   . Depression   . Bipolar affective disorder 03/17/2012  . Mood disorder 03/16/2012   Past Surgical History  Procedure Laterality Date  . Knee surgery      x 5  . Hernia repair     Family History  Problem Relation Age of Onset  . Heart disease Mother   . Heart disease Father    Social History  Substance Use Topics  . Smoking status: Never Smoker   . Smokeless tobacco: Never Used  . Alcohol Use: No    Review of Systems  Skin: Positive for rash.  All other systems reviewed and are negative.     Allergies  Other; Contrast media; and Hydrocodone  Home Medications   Prior to Admission medications   Medication Sig Start Date End Date Taking? Authorizing Provider  buprenorphine (SUBUTEX) 2 MG SUBL SL tablet Place 2 mg under the tongue daily as needed (for symptoms).  09/12/14   Historical Provider, MD  cephALEXin (KEFLEX) 500 MG capsule Take 1 capsule (500  mg total) by mouth 4 (four) times daily. Patient not taking: Reported on 10/04/2014 09/05/14   Joni Reining Pisciotta, PA-C  cloNIDine (CATAPRES) 0.1 MG tablet Take 2 tablets (0.2 mg total) by mouth daily. 10/04/14   Melene Plan, DO  diazepam (VALIUM) 5 MG tablet Take 1 tablet (5 mg total) by mouth every 6 (six) hours as needed for anxiety (spasms). 10/04/14   Melene Plan, DO  doxycycline (VIBRAMYCIN) 100 MG capsule Take 1 capsule (100 mg total) by mouth 2 (two) times daily. Patient not taking: Reported on 10/04/2014 08/12/14   Gilda Crease, MD  sulfamethoxazole-trimethoprim (BACTRIM DS) 800-160 MG per tablet Take 1 tablet by mouth 2 (two) times daily. Patient not taking: Reported on 10/04/2014 09/05/14   Joni Reining Pisciotta, PA-C  zolpidem (AMBIEN) 10 MG tablet Take 10 mg by mouth at bedtime as needed for sleep.     Historical Provider, MD   BP 150/93 mmHg  Pulse 75  Temp(Src) 98 F (36.7 C) (Oral)  Resp 16  Ht  (1.905 m)  Wt 224 lb (101.606 kg)  BMI 28.00 kg/m2  SpO2 100% Physical Exam  Constitutional: He is oriented to person, place, and time. He appears well-developed and well-nourished. No distress.  HENT:  Head: Normocephalic and atraumatic.  Eyes: Conjunctivae are normal. No scleral icterus.  Neck: Neck  supple.  Cardiovascular: Normal rate and intact distal pulses.   Pulmonary/Chest: Effort normal. No stridor. No respiratory distress.  Abdominal: Normal appearance. He exhibits no distension.  Neurological: He is alert and oriented to person, place, and time.  Skin: Skin is warm and dry. No rash noted.  Erythema and warmth of left medial foot. No fluctuance or masses. 2+ DP pulse.  Psychiatric: His behavior is normal. His mood appears anxious.  Nursing note and vitals reviewed.   ED Course  Procedures (including critical care time) Labs Review Labs Reviewed - No data to display  Imaging Review No results found. I, Nyelle Wolfson III, Carlena Bjornstad, personally reviewed and evaluated these  images and lab results as part of my medical decision-making.   EKG Interpretation None      MDM   Final diagnoses:  Cellulitis of foot, left    Patient has cellulitis of his left foot associated with an injection site. No apparent abscess. Will treat with clindamycin. He requests a dose of clonidine to help with his withdrawal symptoms. His blood pressure is 150/93.  Discharged back to treatment facility.  Blake Divine, MD 10/09/14 1245

## 2014-10-09 NOTE — Discharge Instructions (Signed)

## 2014-10-09 NOTE — ED Notes (Signed)
Pt reports left foot pain since last night - area of redness - pt reports "shooting up Iv heroin" at site on Wednesday. Pt states he is going through bad withdrawals. Similar area also noted to left antecubital.

## 2014-10-11 ENCOUNTER — Emergency Department (HOSPITAL_BASED_OUTPATIENT_CLINIC_OR_DEPARTMENT_OTHER)
Admission: EM | Admit: 2014-10-11 | Discharge: 2014-10-11 | Disposition: A | Discharge status: Home / Self Care | Payer: Self-pay | Attending: Emergency Medicine | Admitting: Emergency Medicine

## 2014-10-11 ENCOUNTER — Encounter (HOSPITAL_BASED_OUTPATIENT_CLINIC_OR_DEPARTMENT_OTHER): Payer: Self-pay | Admitting: Emergency Medicine

## 2014-10-11 DIAGNOSIS — F419 Anxiety disorder, unspecified: Secondary | ICD-10-CM | POA: Insufficient documentation

## 2014-10-11 DIAGNOSIS — L02612 Cutaneous abscess of left foot: Secondary | ICD-10-CM | POA: Insufficient documentation

## 2014-10-11 DIAGNOSIS — Z87448 Personal history of other diseases of urinary system: Secondary | ICD-10-CM | POA: Insufficient documentation

## 2014-10-11 DIAGNOSIS — L0291 Cutaneous abscess, unspecified: Secondary | ICD-10-CM

## 2014-10-11 MED ORDER — CEPHALEXIN 500 MG PO CAPS: 500.0000 mg | ORAL_CAPSULE | Freq: Four times a day (QID) | ORAL | Status: DC

## 2014-10-11 MED ORDER — LIDOCAINE-EPINEPHRINE (PF) 2 %-1:200000 IJ SOLN
INTRAMUSCULAR | Status: AC
  Filled 2014-10-11: qty 10

## 2014-10-11 MED ORDER — HYDROMORPHONE HCL 1 MG/ML IJ SOLN
1.0000 mg | Freq: Once | INTRAMUSCULAR | Status: AC
  Administered 2014-10-11: 1 mg via INTRAVENOUS
  Filled 2014-10-11: qty 1

## 2014-10-11 MED ORDER — DEXTROSE 5 % IV SOLN
1.0000 g | Freq: Once | INTRAVENOUS | Status: AC
  Administered 2014-10-11: 1 g via INTRAVENOUS

## 2014-10-11 MED ORDER — LIDOCAINE-EPINEPHRINE (PF) 2 %-1:200000 IJ SOLN
10.0000 mL | Freq: Once | INTRAMUSCULAR | Status: AC
  Administered 2014-10-11: 10 mL via INTRADERMAL

## 2014-10-11 MED ORDER — CEFTRIAXONE SODIUM 1 G IJ SOLR
INTRAMUSCULAR | Status: AC
  Filled 2014-10-11: qty 10

## 2014-10-11 MED ORDER — LIDOCAINE HCL (PF) 2 % IJ SOLN
INTRAMUSCULAR | Status: AC
  Administered 2014-10-11: 5 mL
  Filled 2014-10-11: qty 4

## 2014-10-11 MED ORDER — OXYCODONE-ACETAMINOPHEN 5-325 MG PO TABS
2.0000 | ORAL_TABLET | Freq: Once | ORAL | Status: AC
  Administered 2014-10-11: 2 via ORAL
  Filled 2014-10-11: qty 2

## 2014-10-11 MED ORDER — LIDOCAINE HCL (PF) 2 % IJ SOLN
10.0000 mL | Freq: Once | INTRAMUSCULAR | Status: AC
  Administered 2014-10-11: 10 mL via INTRADERMAL

## 2014-10-11 NOTE — ED Provider Notes (Signed)
CSN: [id number]     Arrival date & time 10/11/14  1817 History   First MD Initiated Contact with Patient 10/11/14 1829     Chief Complaint  Patient presents with  . Foot Pain   HPI  49yo male presenting today for foot pain. Recently seen on 8/12 for left foot rash secondary to injecting heroin into his foot 1.5 weeks ago. Noted to be hot, erythematous, and tender. Diagnosed with cellulitis of left foot at injection site. No abscess was noted. Prescription for Clindamycin given. States he took Clindamycin x1 day and then left the rehab facility he was staying at. Then took Bactrim and Keflex x2 day. States redness, swelling, and pain acutely worsened today around 3pm. Father was present and corroborated that foot acutely worsened today. Denies any further injection of medications. States he is going to a different rehab facility. Took two Oxycodone  that he had left over from a hernia surgery this morning. Denies fever.   Past Medical History  Diagnosis Date  . Drug abuse   . Kidney failure   . Bipolar 1 disorder   . Rhabdomyolysis   . Acute renal failure   . Opiate abuse, continuous   . Dental caries     extensive  . Anxiety   . Depression   . Bipolar affective disorder 03/17/2012  . Mood disorder 03/16/2012   Past Surgical History  Procedure Laterality Date  . Knee surgery      x 5  . Hernia repair     Family History  Problem Relation Age of Onset  . Heart disease Mother   . Heart disease Father    Social History  Substance Use Topics  . Smoking status: Never Smoker   . Smokeless tobacco: Never Used  . Alcohol Use: No    Review of Systems  Constitutional: Negative for fever and chills.  Musculoskeletal: Positive for joint swelling and gait problem.   Allergies  Other; Contrast media; and Hydrocodone  Home Medications   Prior to Admission medications   Medication Sig Start Date End Date Taking? Authorizing Provider  buprenorphine (SUBUTEX) 2 MG SUBL SL tablet  Place 2 mg under the tongue daily as needed (for symptoms).  09/12/14   Historical Provider, MD  cephALEXin (KEFLEX) 500 MG capsule Take 1 capsule (500 mg total) by mouth 4 (four) times daily. 10/11/14   [place] N Rumley, DO  cloNIDine (CATAPRES) 0.1 MG tablet Take 2 tablets (0.2 mg total) by mouth daily. 10/04/14   Melene Plan, DO  diazepam (VALIUM) 5 MG tablet Take 1 tablet (5 mg total) by mouth every 6 (six) hours as needed for anxiety (spasms). 10/04/14   Melene Plan, DO  zolpidem (AMBIEN) 10 MG tablet Take 10 mg by mouth at bedtime as needed for sleep.     Historical Provider, MD   BP 123/67 mmHg  Pulse 68  Temp(Src) 98.4 F (36.9 C) (Oral)  Resp 18  Ht  (1.905 m)  Wt 226 lb (102.513 kg)  BMI 28.25 kg/m2  SpO2 98% Physical Exam  Constitutional: He is oriented to person, place, and time. He appears well-developed and well-nourished. No distress.  HENT:  Head: Atraumatic.  Cardiovascular: Normal rate and regular rhythm.  Exam reveals no gallop and no friction rub.   No murmur heard. Pulmonary/Chest: Effort normal. No respiratory distress. He has no wheezes. He has no rales.  Abdominal: Soft. He exhibits no distension. There is no tenderness. There is no rebound.  Musculoskeletal: Normal range  of motion. He exhibits no edema.  Neurological: He is alert and oriented to person, place, and time.  Skin:  Erythema and warmth over medial aspect of left foot. Fluctuance noted with palpation. Tender. No injection marks noted.    ED Course  Procedures (including critical care time) Labs Review Labs Reviewed  CULTURE, ROUTINE-ABSCESS    Imaging Review No results found. I, [facility], personally reviewed and evaluated these images and lab results as part of my medical decision-making.   EKG Interpretation None      MDM   Final diagnoses:  Abscess  [place] with abscess. Consent obtained for I&D. Incision made and cultures obtained. Percocet given. 1g Rocephin given. Stable for  discharge with Keflex. Instructed to go to [facility] ED if fails to improve. 1mg  dilaudid given prior to discharge.    [address] [place], [place] 10/16/14 1242  Nelva Nay, MD 10/25/14 [phone]

## 2014-10-11 NOTE — ED Notes (Signed)
Patient has noted redness to his left foot, on antibiotics  - reports that pain has become worse over the last hour

## 2014-10-11 NOTE — Discharge Instructions (Signed)
Please take your antibiotics as prescribed every 6 hours for ten days. If not improved within 1-2 days, please go to [facility] Emergency Department. Please take Ibuprofen or Tylenol as needed for pain control.  Abscess An abscess is an infected area that contains a collection of pus and debris.It can occur in almost any part of the body. An abscess is also known as a furuncle or boil. CAUSES  An abscess occurs when tissue gets infected. This can occur from blockage of oil or sweat glands, infection of hair follicles, or a minor injury to the skin. As the body tries to fight the infection, pus collects in the area and creates pressure under the skin. This pressure causes pain. People with weakened immune systems have difficulty fighting infections and get certain abscesses more often.  SYMPTOMS Usually an abscess develops on the skin and becomes a painful mass that is red, warm, and tender. If the abscess forms under the skin, you may feel a moveable soft area under the skin. Some abscesses break open (rupture) on their own, but most will continue to get worse without care. The infection can spread deeper into the body and eventually into the bloodstream, causing you to feel ill.  DIAGNOSIS  Your caregiver will take your medical history and perform a physical exam. A sample of fluid may also be taken from the abscess to determine what is causing your infection. TREATMENT  Your caregiver may prescribe antibiotic medicines to fight the infection. However, taking antibiotics alone usually does not cure an abscess. Your caregiver may need to make a small cut (incision) in the abscess to drain the pus. In some cases, gauze is packed into the abscess to reduce pain and to continue draining the area. HOME CARE INSTRUCTIONS   Only take over-the-counter or prescription medicines for pain, discomfort, or fever as directed by your caregiver.  If you were prescribed antibiotics, take them as directed. Finish  them even if you start to feel better.  If gauze is used, follow your caregiver's directions for changing the gauze.  To avoid spreading the infection:  Keep your draining abscess covered with a bandage.  Wash your hands well.  Do not share personal care items, towels, or whirlpools with others.  Avoid skin contact with others.  Keep your skin and clothes clean around the abscess.  Keep all follow-up appointments as directed by your caregiver. SEEK MEDICAL CARE IF:   You have increased pain, swelling, redness, fluid drainage, or bleeding.  You have muscle aches, chills, or a general ill feeling.  You have a fever. MAKE SURE YOU:   Understand these instructions.  Will watch your condition.  Will get help right away if you are not doing well or get worse. Document Released: 11/23/2004 Document Revised: 08/15/2011 Document Reviewed: 04/28/2011 [facility] Patient Information 2015 [place], [place]. This information is not intended to replace advice given to you by your health care provider. Make sure you discuss any questions you have with your health care provider.

## 2014-10-11 NOTE — ED Notes (Signed)
Wound care completed, I cleaned dried blood with hydrogen peroxide, then heavy folded application of 4x4, with large kerlix wrap, secured with tape. Bleeding controlled.

## 2014-10-15 LAB — CULTURE, ROUTINE-ABSCESS: Gram Stain: NONE SEEN

## 2014-10-16 ENCOUNTER — Telehealth (HOSPITAL_BASED_OUTPATIENT_CLINIC_OR_DEPARTMENT_OTHER): Payer: Self-pay | Admitting: Emergency Medicine

## 2014-10-16 NOTE — Telephone Encounter (Signed)
Post ED Visit - Positive Culture Follow-up  Culture report reviewed by antimicrobial stewardship pharmacist:  Wes Dulaney, Pharm.D., BCPS  Celedonio Miyamoto, Pharm.D., BCPS  Georgina Pillion, [address].D., BCPS  [place], [address].D., BCPS, AAHIVP  Estella Husk, Pharm.D., BCPS, AAHIVP  Elder Cyphers, [address].D., BCPS T. Stone PharmD  Positive abcess culture Strep Treated with cephalexin, organism sensitive to the same and no further patient follow-up is required at this time.  Berle Mull 10/16/2014, 2:46 PM

## 2014-11-06 ENCOUNTER — Inpatient Hospital Stay (HOSPITAL_COMMUNITY): Payer: Self-pay

## 2014-11-06 ENCOUNTER — Encounter (HOSPITAL_COMMUNITY): Payer: Self-pay | Admitting: Cardiology

## 2014-11-06 ENCOUNTER — Emergency Department (HOSPITAL_COMMUNITY): Payer: Self-pay

## 2014-11-06 ENCOUNTER — Inpatient Hospital Stay (HOSPITAL_COMMUNITY)
Admission: EM | Admit: 2014-11-06 | Discharge: 2014-11-09 | DRG: 918 | Discharge status: Against Medical Advice | Payer: Self-pay | Attending: Internal Medicine | Admitting: Internal Medicine

## 2014-11-06 DIAGNOSIS — E860 Dehydration: Secondary | ICD-10-CM | POA: Diagnosis present

## 2014-11-06 DIAGNOSIS — N179 Acute kidney failure, unspecified: Secondary | ICD-10-CM | POA: Diagnosis present

## 2014-11-06 DIAGNOSIS — Z792 Long term (current) use of antibiotics: Secondary | ICD-10-CM

## 2014-11-06 DIAGNOSIS — E875 Hyperkalemia: Secondary | ICD-10-CM | POA: Diagnosis present

## 2014-11-06 DIAGNOSIS — F319 Bipolar disorder, unspecified: Secondary | ICD-10-CM | POA: Diagnosis present

## 2014-11-06 DIAGNOSIS — M6282 Rhabdomyolysis: Secondary | ICD-10-CM | POA: Diagnosis present

## 2014-11-06 DIAGNOSIS — W19XXXA Unspecified fall, initial encounter: Secondary | ICD-10-CM

## 2014-11-06 DIAGNOSIS — M791 Myalgia: Secondary | ICD-10-CM

## 2014-11-06 DIAGNOSIS — F10239 Alcohol dependence with withdrawal, unspecified: Secondary | ICD-10-CM | POA: Diagnosis not present

## 2014-11-06 DIAGNOSIS — F1110 Opioid abuse, uncomplicated: Secondary | ICD-10-CM | POA: Diagnosis present

## 2014-11-06 DIAGNOSIS — T405X1A Poisoning by cocaine, accidental (unintentional), initial encounter: Principal | ICD-10-CM | POA: Diagnosis present

## 2014-11-06 DIAGNOSIS — R079 Chest pain, unspecified: Secondary | ICD-10-CM | POA: Diagnosis present

## 2014-11-06 DIAGNOSIS — M542 Cervicalgia: Secondary | ICD-10-CM | POA: Diagnosis present

## 2014-11-06 DIAGNOSIS — F1910 Other psychoactive substance abuse, uncomplicated: Secondary | ICD-10-CM | POA: Diagnosis present

## 2014-11-06 DIAGNOSIS — R0789 Other chest pain: Secondary | ICD-10-CM | POA: Diagnosis present

## 2014-11-06 DIAGNOSIS — Z79899 Other long term (current) drug therapy: Secondary | ICD-10-CM

## 2014-11-06 DIAGNOSIS — F1410 Cocaine abuse, uncomplicated: Secondary | ICD-10-CM | POA: Diagnosis present

## 2014-11-06 LAB — COMPREHENSIVE METABOLIC PANEL
ALT: 80 U/L — AB (ref 17–63)
AST: 365 U/L — ABNORMAL HIGH (ref 15–41)
Albumin: 3.8 g/dL (ref 3.5–5.0)
Alkaline Phosphatase: 52 U/L (ref 38–126)
Anion gap: 13 (ref 5–15)
BUN: 23 mg/dL — ABNORMAL HIGH (ref 6–20)
CHLORIDE: 97 mmol/L — AB (ref 101–111)
CO2: 22 mmol/L (ref 22–32)
CREATININE: 1.39 mg/dL — AB (ref 0.61–1.24)
Calcium: 9 mg/dL (ref 8.9–10.3)
GFR calc Af Amer: >60 mL/min (ref >60)
GFR calc non Af Amer: 59 mL/min — ABNORMAL LOW (ref >60)
Glucose, Bld: 105 mg/dL — ABNORMAL HIGH (ref 65–99)
POTASSIUM: 4.8 mmol/L (ref 3.5–5.1)
SODIUM: 132 mmol/L — AB (ref 135–145)
Total Bilirubin: 1.4 mg/dL — ABNORMAL HIGH (ref 0.3–1.2)
Total Protein: 7.5 g/dL (ref 6.5–8.1)

## 2014-11-06 LAB — CBC WITH DIFFERENTIAL/PLATELET
Basophils Absolute: 0 10*3/uL (ref 0.0–0.1)
Basophils Relative: 0 % (ref 0–1)
EOS ABS: 0 10*3/uL (ref 0.0–0.7)
EOS PCT: 0 % (ref 0–5)
HCT: 34.8 % — ABNORMAL LOW (ref 39.0–52.0)
Hemoglobin: 11.8 g/dL — ABNORMAL LOW (ref 13.0–17.0)
Lymphocytes Relative: 11 % — ABNORMAL LOW (ref 12–46)
Lymphs Abs: 1.2 10*3/uL (ref 0.7–4.0)
MCH: 28.9 pg (ref 26.0–34.0)
MCHC: 33.9 g/dL (ref 30.0–36.0)
MCV: 85.1 fL (ref 78.0–100.0)
MONO ABS: 1.1 10*3/uL — AB (ref 0.1–1.0)
Monocytes Relative: 11 % (ref 3–12)
Neutro Abs: 8.5 10*3/uL — ABNORMAL HIGH (ref 1.7–7.7)
Neutrophils Relative %: 78 % — ABNORMAL HIGH (ref 43–77)
PLATELETS: 298 10*3/uL (ref 150–400)
RBC: 4.09 MIL/uL — AB (ref 4.22–5.81)
RDW: 15 % (ref 11.5–15.5)
WBC: 10.8 10*3/uL — AB (ref 4.0–10.5)

## 2014-11-06 LAB — ACETAMINOPHEN LEVEL: Acetaminophen (Tylenol), Serum: 20 ug/mL (ref 10–30)

## 2014-11-06 LAB — I-STAT CHEM 8, ED
BUN: 31 mg/dL — ABNORMAL HIGH (ref 6–20)
CALCIUM ION: 1.05 mmol/L — AB (ref 1.12–1.23)
Chloride: 99 mmol/L — ABNORMAL LOW (ref 101–111)
Creatinine, Ser: 1.4 mg/dL — ABNORMAL HIGH (ref 0.61–1.24)
GLUCOSE: 103 mg/dL — AB (ref 65–99)
HCT: 38 % — ABNORMAL LOW (ref 39.0–52.0)
HEMOGLOBIN: 12.9 g/dL — AB (ref 13.0–17.0)
Potassium: 5 mmol/L (ref 3.5–5.1)
SODIUM: 133 mmol/L — AB (ref 135–145)
TCO2: 23 mmol/L (ref 0–100)

## 2014-11-06 LAB — TROPONIN I
Troponin I: <0.03 ng/mL (ref <0.031)
Troponin I: <0.03 ng/mL (ref <0.031)

## 2014-11-06 LAB — I-STAT TROPONIN, ED: TROPONIN I, POC: 0.02 ng/mL (ref 0.00–0.08)

## 2014-11-06 LAB — SALICYLATE LEVEL

## 2014-11-06 LAB — ETHANOL

## 2014-11-06 LAB — MRSA PCR SCREENING: MRSA by PCR: NEGATIVE

## 2014-11-06 MED ORDER — FENTANYL CITRATE (PF) 100 MCG/2ML IJ SOLN
50.0000 ug | Freq: Once | INTRAMUSCULAR | Status: AC
  Administered 2014-11-06: 50 ug via INTRAVENOUS
  Filled 2014-11-06: qty 2

## 2014-11-06 MED ORDER — SODIUM CHLORIDE 0.9 % IV SOLN
INTRAVENOUS | Status: AC
  Administered 2014-11-06: 15:00:00 via INTRAVENOUS

## 2014-11-06 MED ORDER — ACETAMINOPHEN 325 MG PO TABS
650.0000 mg | ORAL_TABLET | ORAL | Status: DC | PRN
  Administered 2014-11-08: 650 mg via ORAL
  Filled 2014-11-06: qty 2

## 2014-11-06 MED ORDER — LORAZEPAM 2 MG/ML IJ SOLN
1.0000 mg | Freq: Once | INTRAMUSCULAR | Status: AC
  Administered 2014-11-06: 1 mg via INTRAVENOUS
  Filled 2014-11-06: qty 1

## 2014-11-06 MED ORDER — GI COCKTAIL ~~LOC~~: 30.0000 mL | Freq: Four times a day (QID) | ORAL | Status: DC | PRN

## 2014-11-06 MED ORDER — NITROGLYCERIN 0.4 MG SL SUBL
0.4000 mg | SUBLINGUAL_TABLET | SUBLINGUAL | Status: DC | PRN
  Administered 2014-11-06 (×2): 0.4 mg via SUBLINGUAL
  Filled 2014-11-06: qty 1

## 2014-11-06 MED ORDER — HEPARIN SODIUM (PORCINE) 5000 UNIT/ML IJ SOLN
5000.0000 [IU] | Freq: Three times a day (TID) | INTRAMUSCULAR | Status: DC
  Administered 2014-11-06 – 2014-11-08 (×4): 5000 [IU] via SUBCUTANEOUS
  Filled 2014-11-06 (×4): qty 1

## 2014-11-06 MED ORDER — SODIUM CHLORIDE 0.9 % IV BOLUS (SEPSIS)
1000.0000 mL | Freq: Once | INTRAVENOUS | Status: AC
  Administered 2014-11-06: 1000 mL via INTRAVENOUS

## 2014-11-06 MED ORDER — ONDANSETRON HCL 4 MG/2ML IJ SOLN
4.0000 mg | Freq: Four times a day (QID) | INTRAMUSCULAR | Status: DC | PRN
  Administered 2014-11-08: 4 mg via INTRAVENOUS
  Filled 2014-11-06: qty 2

## 2014-11-06 MED ORDER — SODIUM CHLORIDE 0.9 % IV SOLN: INTRAVENOUS | Status: AC

## 2014-11-06 MED ORDER — LORAZEPAM 2 MG/ML IJ SOLN
1.0000 mg | INTRAMUSCULAR | Status: DC | PRN
  Administered 2014-11-06 (×2): 1 mg via INTRAVENOUS
  Filled 2014-11-06 (×2): qty 1

## 2014-11-06 MED ORDER — NITROGLYCERIN IN D5W 200-5 MCG/ML-% IV SOLN
10.0000 ug/min | INTRAVENOUS | Status: DC
  Administered 2014-11-06: 10 ug/min via INTRAVENOUS
  Filled 2014-11-06: qty 250

## 2014-11-06 NOTE — ED Notes (Signed)
Patient has returned from being out of the department at this time; patient placed back on monitor, continuous pulse oximetry and blood pressure cuff; [employer] at bedside

## 2014-11-06 NOTE — ED Notes (Signed)
Patient undressed, placed in gown, on monitor, continuous pulse oximetry and blood pressure cuff; vitals and EKG performed; [employer]PD at bedside

## 2014-11-06 NOTE — H&P (Signed)
Date: 11/06/2014               Patient Name:  Brandon [name]. MRN: [id number]  DOB: June 16, 1965 Age / Sex: 49 y.o., male   PCP: No primary care provider on file.         Medical Service: Internal Medicine Teaching Service         Attending Physician: Dr. Doneen Poisson, MD    First Contact: Dr. Orest Dikes Pager: [phone]  Second Contact: Dr. Hyacinth Meeker Pager: [phone]       After Hours (After 5p/  First Contact Pager: [phone]  weekends / holidays): Second Contact Pager: [phone]   Chief Complaint: chest pain  History of Present Illness: Brandon [name]. is a 49 year old male with PMH of bipolar disorder and polysubstance abuse which includes cocaine, heroine, ETOH who presents to [facility] with chest pain.  He reports the chest pain started this morning while he was smoking crack cocaine.  He does not give a quantify but does report  "a whole lot."  He notes that the pain was substernal and left sided without radiation.  It started about 1.5 hrs to 3 hours prior to arrival in the ED and did not resolve until he was placed on a Nitro drip in the Emergency department.  He reports this type of pain has never happened before when he uses drugs.  While I was in the room he also grunted and groaned a lot and reported severe neck pain.  He had apparently told the ED provide that he may have passed out and fallen.  Again with me he is unsure if he has passed out or fallen.  He is not sure if he has hit his head or neck but reports that it is painful to move it. In the ED his initial EKG was concerning for possible ST elevation in anterior leads a code stemi was called but canceled after cardiology reviewed his EKG.  A CT of his head was also obtained and was unremarkable. Patient does report that he is hungry and has not eaten in 3 days.   Meds: Current Facility-Administered Medications  Medication Dose Route Frequency Provider Last Rate Last Dose  . nitroGLYCERIN (NITROSTAT) SL tablet 0.4 mg  0.4 mg  Sublingual Q5 min PRN Richardean Canal, MD   0.4 mg at 11/06/14 0843  . nitroGLYCERIN 50 mg in dextrose 5 % 250 mL (0.2 mg/mL) infusion  10-200 mcg/min Intravenous Titrated Richardean Canal, MD 6 mL/hr at 11/06/14 1038 20 mcg/min at 11/06/14 1038   Current Outpatient Prescriptions  Medication Sig Dispense Refill  . buprenorphine (SUBUTEX) 2 MG SUBL SL tablet Place 2 mg under the tongue daily as needed (for symptoms).     . cephALEXin (KEFLEX) 500 MG capsule Take 1 capsule (500 mg total) by mouth 4 (four) times daily. 40 capsule 0  . cloNIDine (CATAPRES) 0.1 MG tablet Take 2 tablets (0.2 mg total) by mouth daily. 3 tablet 0  . diazepam (VALIUM) 5 MG tablet Take 1 tablet (5 mg total) by mouth every 6 (six) hours as needed for anxiety (spasms). 10 tablet 0  . zolpidem (AMBIEN) 10 MG tablet Take 10 mg by mouth at bedtime as needed for sleep.       Allergies: Allergies as of 11/06/2014 - Review Complete 11/06/2014  Allergen Reaction Noted  . Contrast media [iodinated diagnostic agents] Shortness Of Breath and Anxiety 09/05/2014  . Food Anaphylaxis and Shortness Of Breath 11/06/2014  .  Hydrocodone Other (See Comments) 09/05/2014   Past Medical History  Diagnosis Date  . Drug abuse   . Kidney failure   . Bipolar 1 disorder   . Rhabdomyolysis   . Acute renal failure   . Opiate abuse, continuous   . Dental caries     extensive  . Anxiety   . Depression   . Bipolar affective disorder 03/17/2012  . Mood disorder 03/16/2012   Past Surgical History  Procedure Laterality Date  . Knee surgery      x 5  . Hernia repair     Family History  Problem Relation Age of Onset  . Heart disease Mother   . Heart disease Father    Social History   Social History  . Marital Status: Divorced    Spouse Name: N/A  . Number of Children: N/A  . Years of Education: N/A   Occupational History  . Not on file.   Social History Main Topics  . Smoking status: Never Smoker   . Smokeless tobacco: Never Used  .  Alcohol Use: No  . Drug Use: Yes    Special: Oxycodone, Amphetamines, Barbituates, Cocaine, Methamphetamines, IV     Comment: admits to injecting heroin yesterday  . Sexual Activity: Yes    Birth Control/ Protection: None   Other Topics Concern  . Not on file   Social History Narrative   ** Merged History Encounter **        Review of Systems: Review of Systems  Constitutional: Negative for fever and malaise/fatigue.  Respiratory: Negative for cough.   Cardiovascular: Positive for chest pain (now resolved). Negative for orthopnea and leg swelling.  Gastrointestinal: Negative for abdominal pain.  Musculoskeletal: Positive for falls (possible not sure) and neck pain.  Neurological: Negative for focal weakness and headaches.  Psychiatric/Behavioral: Positive for substance abuse. Negative for suicidal ideas.     Physical Exam: Blood pressure 127/89, pulse 73, temperature 97.9 F (36.6 C), temperature source Oral, resp. rate 17, weight 102.513 kg (226 lb), SpO2 99 %. Physical Exam  Constitutional: He is oriented to person, place, and time and well-developed, well-nourished, and in no distress.  HENT:  Head: Normocephalic and atraumatic.  Eyes: Conjunctivae are normal.  Neck:  Rotated left, no active ROM did not allow for passive ROM due to pain, right paraspinals acutely tender to touch.  Cardiovascular: Normal rate and regular rhythm.   Pulmonary/Chest: Effort normal and breath sounds normal.  Abdominal: Soft. Bowel sounds are normal.  Musculoskeletal:  Gross strength 4/5 in all extremities/poor effort, no focal deficit  Neurological: He is alert and oriented to person, place, and time.  Nursing note and vitals reviewed.    Lab results: Basic Metabolic Panel:  Recent Labs  16/10/96 0735 11/06/14 0750  NA 132* 133*  K 4.8 5.0  CL 97* 99*  CO2 22  --   GLUCOSE 105* 103*  BUN 23* 31*  CREATININE 1.39* 1.40*  CALCIUM 9.0  --    Liver Function Tests:  Recent  Labs  11/06/14 0735  AST 365*  ALT 80*  ALKPHOS 52  BILITOT 1.4*  PROT 7.5  ALBUMIN 3.8   CBC:  Recent Labs  11/06/14 0735 11/06/14 0750  WBC 10.8*  --   NEUTROABS 8.5*  --   HGB 11.8* 12.9*  HCT 34.8* 38.0*  MCV 85.1  --   PLT 298  --    Cardiac Enzymes:  Recent Labs  11/06/14 1330  TROPONINI <0.03   Urine Drug  Screen: Drugs of Abuse     Component Value Date/Time   LABOPIA POSITIVE* 10/04/2014 1510   COCAINSCRNUR NONE DETECTED 10/04/2014 1510   LABBENZ NONE DETECTED 10/04/2014 1510   AMPHETMU NONE DETECTED 10/04/2014 1510   THCU NONE DETECTED 10/04/2014 1510   LABBARB POSITIVE* 10/04/2014 1510    Alcohol Level:  Recent Labs  11/06/14 0735  ETH <5    Imaging results:  Dg Chest 1 View  11/06/2014   CLINICAL DATA:  Intoxication.  EXAM: CHEST  1 VIEW  COMPARISON:  PA and lateral chest 08/12/2014.  FINDINGS: Heart size and mediastinal contours are within normal limits. Both lungs are clear. Visualized skeletal structures are unremarkable.  IMPRESSION: Negative exam.   Electronically Signed   By: Drusilla Kanner M.D.   On: 11/06/2014 08:13   Ct Head Wo Contrast  11/06/2014   CLINICAL DATA:  Altered mental status.  Drug abuse.  EXAM: CT HEAD WITHOUT CONTRAST  TECHNIQUE: Contiguous axial images were obtained from the base of the skull through the vertex without intravenous contrast.  COMPARISON:  Head CT scan 10/26/2013.  FINDINGS: The brain appears normal without hemorrhage, infarct, mass lesion, mass effect, midline shift or abnormal extra-axial fluid collection. No hydrocephalus or pneumocephalus. The calvarium is intact. Mild mucosal thickening in the maxillary sinuses is identified. There is no pneumocephalus or hydrocephalus.  IMPRESSION: No acute abnormality.  Mild maxillary sinus disease.   Electronically Signed   By: Drusilla Kanner M.D.   On: 11/06/2014 08:48    Other results: EKG: Normal sinus rhythm with mild ST elevation in leads V2 and V3.  [place]  prolonged QTc at 481.  Assessment & Plan by Problem: 49 yo Male with history of polysubstance abuse presents with chest pain and possible syncopal episode after/ during cocaine use.    Chest pain - EKG shows some mild anterior lead ST elevations without receprical changes.  A code STEMI was called but after review of the EKG by Dr Katrinka Blazing this was canceled.  His initial troponin and first repeat are both negative.  He has no known CAD.  He did not get relief but SL NTG but did get relief by a NTG drip.  It was recommended by cardiology that patient be admitted for ACS rule out by IMTS but his Chest pain was likely secondary to coronary vasospasm due to cocaine. -Admit to stepdown given Nitro drip - Given 325 ASA by EMS - No BB given active cocaine -Trend CE, if positive, formal Cards consult, Heparin drip, start statin.    Neck pain- likely MSK - He initially complained of severe neck pain when I saw him, I spoke with Dr Silverio Lay the ED physican who reported he had no complaints of neck pain when he evaluated patinet and his neck was moving freely.  Exam was limited by pain. - CT C-spine ordered by Dr Silverio Lay: no acute fracture - By exam suspect muscle spasm - Narcotic pain medications would be more harmful than helpful for this patinet. Will try heating pad. If no improved can give toradol inj.   Possible syncope. - We are ruling out ACS, his EKG is otherwise unremarkable.  Given his mild AKI, report of decreased PO intake and drug abuse if he did syncopize it is unlikely a cardiac event.  I suspect he may be orthostatic or it may but due to his drug use. - Monitor on telemetry - Check orthostatics (although will be after at least 1L of fluid bolus.)  Polysubstance abuse - This appears to be the route of many of his medical problems. -Consult social work if patient expresses desire to quit - Monitor for withdrawl    Bipolar disorder -Currently not on medication     AKI (acute kidney  injury) -SCr increased to 1.4 from baseline of of 1.  BUN is also increased to 31, likely prerenal in setting of cocaine beinge.  Will give 2L of NS and repeat in AM.  Dispo: Disposition is deferred at this time, awaiting improvement of current medical problems. Anticipated discharge in approximately 1 day(s).   The patient does not have a current PCP (No primary care provider on file.) and does not need an [facility] hospital follow-up appointment after discharge.  Can get appointment a [facility]  The patient does not have transportation limitations that hinder transportation to clinic appointments.  Signed: Gust Rung, DO 11/06/2014, 11:46 AM

## 2014-11-06 NOTE — Progress Notes (Signed)
Dr. Dimple Casey notified of pt irritability, anxiety and screaming out at staff, thrashing about bed.  Orders received.  Plan reported to next RN.

## 2014-11-06 NOTE — ED Notes (Signed)
Pt denies CP at this time 

## 2014-11-06 NOTE — Progress Notes (Signed)
MD notified of pt restless, irritable. Will continue to monitor pt closely.

## 2014-11-06 NOTE — Progress Notes (Signed)
MD notified that pt anxious, irritable, thrashing in bed. Pt requesting something to help him relax and manage his anxiety.  Orders received.  Will continue to monitor pt closely.

## 2014-11-06 NOTE — ED Provider Notes (Addendum)
CSN: [id number]     Arrival date & time 11/06/14  0716 History   First MD Initiated Contact with Patient 11/06/14 [phone]     Chief Complaint  Patient presents with  . Drug Overdose     (Consider location/radiation/quality/duration/timing/severity/associated sxs/prior Treatment) The history is provided by the patient.  Brandon [name]. is a 49 y.o. male hx of cocaine abuse, bipolar here presenting with chest pain, syncope. Patient has been binging on cocaine for the last 3 days. Has substernal chest pain since yesterday. Also states that he may have passed out yesterday. Did not remember how he passed out. He also drinks alcohol and last drink was yesterday. Was recently here for foot infection from IV drug use. Given ASA 325 mg, nitro by EMS.    Past Medical History  Diagnosis Date  . Drug abuse   . Kidney failure   . Bipolar 1 disorder   . Rhabdomyolysis   . Acute renal failure   . Opiate abuse, continuous   . Dental caries     extensive  . Anxiety   . Depression   . Bipolar affective disorder 03/17/2012  . Mood disorder 03/16/2012   Past Surgical History  Procedure Laterality Date  . Knee surgery      x 5  . Hernia repair     Family History  Problem Relation Age of Onset  . Heart disease Mother   . Heart disease Father    Social History  Substance Use Topics  . Smoking status: Never Smoker   . Smokeless tobacco: Never Used  . Alcohol Use: No    Review of Systems  Cardiovascular: Positive for chest pain.  All other systems reviewed and are negative.     Allergies  Contrast media; Food; and Hydrocodone  Home Medications   Prior to Admission medications   Medication Sig Start Date End Date Taking? Authorizing Provider  buprenorphine (SUBUTEX) 2 MG SUBL SL tablet Place 2 mg under the tongue daily as needed (for symptoms).  09/12/14   Historical Provider, MD  cephALEXin (KEFLEX) 500 MG capsule Take 1 capsule (500 mg total) by mouth 4 (four) times daily. 10/11/14    [place] N Rumley, DO  cloNIDine (CATAPRES) 0.1 MG tablet Take 2 tablets (0.2 mg total) by mouth daily. 10/04/14   Melene Plan, DO  diazepam (VALIUM) 5 MG tablet Take 1 tablet (5 mg total) by mouth every 6 (six) hours as needed for anxiety (spasms). 10/04/14   Melene Plan, DO  zolpidem (AMBIEN) 10 MG tablet Take 10 mg by mouth at bedtime as needed for sleep.     Historical Provider, MD   BP 127/89 mmHg  Pulse 73  Temp(Src) 97.9 F (36.6 C) (Oral)  Resp 17  Wt 226 lb (102.513 kg)  SpO2 99% Physical Exam  Constitutional: He is oriented to person, place, and time.  Uncomfortable   HENT:  Head: Normocephalic.  Mouth/Throat: Oropharynx is clear and moist.  Atraumatic head   Eyes: Conjunctivae are normal. Pupils are equal, round, and reactive to light.  Neck: Normal range of motion. Neck supple.  Cardiovascular: Normal rate, regular rhythm and normal heart sounds.   Pulmonary/Chest: Effort normal and breath sounds normal. No respiratory distress. He has no wheezes. He has no rales.  Abdominal: Soft. Bowel sounds are normal.  Musculoskeletal: Normal range of motion. He exhibits no edema or tenderness.  Neurological: He is alert and oriented to person, place, and time. No cranial nerve deficit. Coordination normal.  Skin:  Skin is warm and dry.  Psychiatric: He has a normal mood and affect. His behavior is normal. Judgment and thought content normal.  Nursing note and vitals reviewed.   ED Course  Procedures (including critical care time)  CRITICAL CARE Performed by: Silverio Lay, DAVID   Total critical care time: 30 min   Critical care time was exclusive of separately billable procedures and treating other patients.  Critical care was necessary to treat or prevent imminent or life-threatening deterioration.  Critical care was time spent personally by me on the following activities: development of treatment plan with patient and/or surrogate as well as nursing, discussions with consultants,  evaluation of patient's response to treatment, examination of patient, obtaining history from patient or surrogate, ordering and performing treatments and interventions, ordering and review of laboratory studies, ordering and review of radiographic studies, pulse oximetry and re-evaluation of patient's condition.   Labs Review Labs Reviewed  CBC WITH DIFFERENTIAL/PLATELET - Abnormal; Notable for the following:    WBC 10.8 (*)    RBC 4.09 (*)    Hemoglobin 11.8 (*)    HCT 34.8 (*)    Neutrophils Relative % 78 (*)    Neutro Abs 8.5 (*)    Lymphocytes Relative 11 (*)    Monocytes Absolute 1.1 (*)    All other components within normal limits  COMPREHENSIVE METABOLIC PANEL - Abnormal; Notable for the following:    Sodium 132 (*)    Chloride 97 (*)    Glucose, Bld 105 (*)    BUN 23 (*)    Creatinine, Ser 1.39 (*)    AST 365 (*)    ALT 80 (*)    Total Bilirubin 1.4 (*)    GFR calc non Af Amer 59 (*)    All other components within normal limits  I-STAT CHEM 8, ED - Abnormal; Notable for the following:    Sodium 133 (*)    Chloride 99 (*)    BUN 31 (*)    Creatinine, Ser 1.40 (*)    Glucose, Bld 103 (*)    Calcium, Ion 1.05 (*)    Hemoglobin 12.9 (*)    HCT 38.0 (*)    All other components within normal limits  ETHANOL  SALICYLATE LEVEL  ACETAMINOPHEN LEVEL  URINE RAPID DRUG SCREEN, HOSP PERFORMED  I-STAT TROPOININ, ED    Imaging Review Dg Chest 1 View  11/06/2014   CLINICAL DATA:  Intoxication.  EXAM: CHEST  1 VIEW  COMPARISON:  PA and lateral chest 08/12/2014.  FINDINGS: Heart size and mediastinal contours are within normal limits. Both lungs are clear. Visualized skeletal structures are unremarkable.  IMPRESSION: Negative exam.   Electronically Signed   By: Drusilla Kanner M.D.   On: 11/06/2014 08:13   Ct Head Wo Contrast  11/06/2014   CLINICAL DATA:  Altered mental status.  Drug abuse.  EXAM: CT HEAD WITHOUT CONTRAST  TECHNIQUE: Contiguous axial images were obtained from the  base of the skull through the vertex without intravenous contrast.  COMPARISON:  Head CT scan 10/26/2013.  FINDINGS: The brain appears normal without hemorrhage, infarct, mass lesion, mass effect, midline shift or abnormal extra-axial fluid collection. No hydrocephalus or pneumocephalus. The calvarium is intact. Mild mucosal thickening in the maxillary sinuses is identified. There is no pneumocephalus or hydrocephalus.  IMPRESSION: No acute abnormality.  Mild maxillary sinus disease.   Electronically Signed   By: Drusilla Kanner M.D.   On: 11/06/2014 08:48   I have personally reviewed and evaluated these  images and lab results as part of my medical decision-making.   EKG Interpretation   Date/Time:  Friday November 06 2014 07:37:55 EDT Ventricular Rate:  73 PR Interval:  135 QRS Duration: 85 QT Interval:  438 QTC Calculation: 483 R Axis:   28 Text Interpretation:  Sinus rhythm ST elevation suggests acute  pericarditis Borderline prolonged QT interval ? STEMI V2, V3, No STEMI per  Dr. Waldon Reining by Silverio Lay  MD, DAVID ([id number]) on 11/06/2014 7:56:46 AM      MDM   Final diagnoses:  Other chest pain  Cocaine abuse   Brandon Markovic. is a 49 y.o. male here with chest pain, cocaine abuse. Has ? STEMI on initial EKG, I consulted Dr. Katrinka Blazing at 7:40 AM, who agrees that there are EKG changes but doesn't want to activate STEMI. Consider vasospasm vs pericarditis as per Dr. Katrinka Blazing. Will get trop, labs, CT head.   9 AM  Trop neg x 1. CT head nl. Still has active chest pain. Will start nitro drip. Consulted cardiology. Medicine to admit to stepdown under internal medicine.   11:08 AM Pain controlled with nitro drip. Internal medicine to admit.   11:34 AM Dr. Elease Hashimoto from cardiology called me. States that since first troponin neg, can get ruled out under medicine service. Consult tomorrow if he has chest pain or troponin positive. Likely cocaine induced vasospasm.       Richardean Canal, MD 11/06/14  1108  Richardean Canal, MD 11/06/14 1136

## 2014-11-06 NOTE — ED Notes (Signed)
Pt to department via EMS- pt reports he has been using cocaine for the past 3 days. Pt used 1500$ over this time period. Pt also passed out and hit his head at some point he reports. Given 324 asa 1 nitro and  versed. Pt reports midsternal chest pain, shoulder pain and back pain. 6/10. Pt has been A&O with EMS. Pt unable to sit still. Bp- 123/76 HR-70

## 2014-11-06 NOTE — ED Notes (Signed)
EDP at the bedside.  ?

## 2014-11-06 NOTE — ED Notes (Signed)
Pt has over 6000 dollars in cash in his wallet. [employer] currently has posession of wallet and money. Instructed [employer] to let staff know if they gave the wallet back to pt so that we can put it in the safe.

## 2014-11-06 NOTE — ED Notes (Signed)
Patient transported to X-ray 

## 2014-11-07 DIAGNOSIS — M797 Fibromyalgia: Secondary | ICD-10-CM

## 2014-11-07 DIAGNOSIS — F1123 Opioid dependence with withdrawal: Secondary | ICD-10-CM

## 2014-11-07 DIAGNOSIS — M6282 Rhabdomyolysis: Secondary | ICD-10-CM | POA: Insufficient documentation

## 2014-11-07 DIAGNOSIS — E875 Hyperkalemia: Secondary | ICD-10-CM | POA: Insufficient documentation

## 2014-11-07 LAB — BASIC METABOLIC PANEL
ANION GAP: 9 (ref 5–15)
Anion gap: 7 (ref 5–15)
BUN: 11 mg/dL (ref 6–20)
BUN: 11 mg/dL (ref 6–20)
CALCIUM: 8.3 mg/dL — AB (ref 8.9–10.3)
CALCIUM: 8.5 mg/dL — AB (ref 8.9–10.3)
CO2: 21 mmol/L — ABNORMAL LOW (ref 22–32)
CO2: 23 mmol/L (ref 22–32)
CREATININE: 0.9 mg/dL (ref 0.61–1.24)
CREATININE: 0.93 mg/dL (ref 0.61–1.24)
Chloride: 105 mmol/L (ref 101–111)
Chloride: 107 mmol/L (ref 101–111)
GFR calc Af Amer: >60 mL/min (ref >60)
GFR calc Af Amer: >60 mL/min (ref >60)
GLUCOSE: 92 mg/dL (ref 65–99)
Glucose, Bld: 123 mg/dL — ABNORMAL HIGH (ref 65–99)
Potassium: 5.8 mmol/L — ABNORMAL HIGH (ref 3.5–5.1)
Potassium: 4.4 mmol/L (ref 3.5–5.1)
Sodium: 135 mmol/L (ref 135–145)
Sodium: 137 mmol/L (ref 135–145)

## 2014-11-07 LAB — RAPID URINE DRUG SCREEN, HOSP PERFORMED
Amphetamines: NOT DETECTED
BENZODIAZEPINES: POSITIVE — AB
Barbiturates: NOT DETECTED
Cocaine: POSITIVE — AB
OPIATES: POSITIVE — AB
Tetrahydrocannabinol: NOT DETECTED

## 2014-11-07 LAB — HEPATITIS B SURFACE ANTIBODY,QUALITATIVE: Hep B S Ab: NONREACTIVE

## 2014-11-07 LAB — CK: Total CK: 3115 U/L — ABNORMAL HIGH (ref 49–397)

## 2014-11-07 LAB — HIV ANTIBODY (ROUTINE TESTING W REFLEX): HIV SCREEN 4TH GENERATION: NONREACTIVE

## 2014-11-07 LAB — HEPATITIS B CORE ANTIBODY, TOTAL: Hep B Core Total Ab: NEGATIVE

## 2014-11-07 LAB — HEPATITIS C ANTIBODY: HCV Ab: <0.1 {s_co_ratio} (ref 0.0–0.9)

## 2014-11-07 LAB — HEPATITIS B SURFACE ANTIGEN: HEP B S AG: NEGATIVE

## 2014-11-07 MED ORDER — BACLOFEN 5 MG HALF TABLET
5.0000 mg | ORAL_TABLET | Freq: Three times a day (TID) | ORAL | Status: DC
  Administered 2014-11-07 – 2014-11-08 (×5): 5 mg via ORAL
  Filled 2014-11-07 (×5): qty 1

## 2014-11-07 MED ORDER — VITAMIN B-1 100 MG PO TABS
100.0000 mg | ORAL_TABLET | Freq: Every day | ORAL | Status: DC
  Administered 2014-11-08: 100 mg via ORAL
  Filled 2014-11-07: qty 1

## 2014-11-07 MED ORDER — ADULT MULTIVITAMIN W/MINERALS CH
1.0000 | ORAL_TABLET | Freq: Every day | ORAL | Status: DC
  Administered 2014-11-07 – 2014-11-08 (×2): 1 via ORAL
  Filled 2014-11-07 (×2): qty 1

## 2014-11-07 MED ORDER — DICLOFENAC SODIUM 1 % TD GEL
2.0000 g | Freq: Four times a day (QID) | TRANSDERMAL | Status: DC
  Administered 2014-11-07 – 2014-11-08 (×2): 2 g via TOPICAL
  Filled 2014-11-07 (×3): qty 100

## 2014-11-07 MED ORDER — FOLIC ACID 1 MG PO TABS
1.0000 mg | ORAL_TABLET | Freq: Every day | ORAL | Status: DC
  Administered 2014-11-07 – 2014-11-08 (×2): 1 mg via ORAL
  Filled 2014-11-07 (×2): qty 1

## 2014-11-07 MED ORDER — LORAZEPAM 2 MG/ML IJ SOLN
1.0000 mg | INTRAMUSCULAR | Status: DC | PRN
  Administered 2014-11-07 – 2014-11-08 (×10): 1 mg via INTRAVENOUS
  Filled 2014-11-07 (×10): qty 1

## 2014-11-07 MED ORDER — SODIUM CHLORIDE 0.9 % IV SOLN: INTRAVENOUS | Status: DC

## 2014-11-07 MED ORDER — SODIUM CHLORIDE 0.9 % IV SOLN
INTRAVENOUS | Status: AC
Start: 2014-11-07 — End: 2014-11-08
  Administered 2014-11-07: 11:00:00 via INTRAVENOUS
  Administered 2014-11-08: 150 mL via INTRAVENOUS

## 2014-11-07 MED ORDER — LORAZEPAM 2 MG/ML IJ SOLN
1.0000 mg | INTRAMUSCULAR | Status: DC | PRN
  Administered 2014-11-07 (×6): 1 mg via INTRAVENOUS
  Filled 2014-11-07 (×6): qty 1

## 2014-11-07 MED ORDER — THIAMINE HCL 100 MG/ML IJ SOLN
100.0000 mg | Freq: Every day | INTRAMUSCULAR | Status: DC
  Administered 2014-11-07: 1 mg via INTRAVENOUS
  Filled 2014-11-07: qty 2

## 2014-11-07 NOTE — Progress Notes (Signed)
Subjective: Patient has been intermittently agitated throughout the night, controlled with IV ativan as needed. He has resolution of his chest pain but complains of diffuse myalgias throughout his upper and lower body.   Objective: Vital signs in last 24 hours: Filed Vitals:   11/07/14 0400 11/07/14 0800 11/07/14 0808 11/07/14 1200  BP: 116/65 129/91 132/98 112/87  Pulse: 79 64 71 64  Temp:   99.1 F (37.3 C) 98.1 F (36.7 C)  TempSrc:   Axillary Oral  Resp: 18 13 17 12   Height:      Weight:      SpO2: 98% 100% 100% 99%   Weight change:   Intake/Output Summary (Last 24 hours) at 11/07/14 1403 Last data filed at 11/07/14 1200  Gross per 24 hour  Intake 1599.41 ml  Output    675 ml  Net 924.41 ml   GENERAL- restive, NAD CARDIAC- RRR, no murmurs, rubs or gallops. RESP- Clear on anterior and lateral auscultation, no wheezes or crackles. ABDOMEN- Soft, mild diffuse TTP, no guarding or rebound EXTREMITIES- pulse 2+, symmetric, no pedal edema, TTP over muscle groups especially at thighs SKIN- Warm, dry, No rash or lesion. PSYCH- Normal mood and affect, appropriate thought content and speech.  Lab Results: Basic Metabolic Panel:  Recent Labs Lab 11/06/14 0735 11/06/14 0750 11/07/14 0310  NA 132* 133* 135  K 4.8 5.0 5.8*  CL 97* 99* 105  CO2 22  --  21*  GLUCOSE 105* 103* 92  BUN 23* 31* 11  CREATININE 1.39* 1.40* 0.90  CALCIUM 9.0  --  8.3*   Liver Function Tests:  Recent Labs Lab 11/06/14 0735  AST 365*  ALT 80*  ALKPHOS 52  BILITOT 1.4*  PROT 7.5  ALBUMIN 3.8   No results for input(s): LIPASE, AMYLASE in the last 168 hours. No results for input(s): AMMONIA in the last 168 hours. CBC:  Recent Labs Lab 11/06/14 0735 11/06/14 0750  WBC 10.8*  --   NEUTROABS 8.5*  --   HGB 11.8* 12.9*  HCT 34.8* 38.0*  MCV 85.1  --   PLT 298  --    Cardiac Enzymes:  Recent Labs Lab 11/06/14 1330 11/06/14 1916  TROPONINI <0.03 <0.03   BNP: No results  for input(s): PROBNP in the last 168 hours. D-Dimer: No results for input(s): DDIMER in the last 168 hours. CBG: No results for input(s): GLUCAP in the last 168 hours. Hemoglobin A1C: No results for input(s): HGBA1C in the last 168 hours. Fasting Lipid Panel: No results for input(s): CHOL, HDL, LDLCALC, TRIG, CHOLHDL, LDLDIRECT in the last 168 hours. Thyroid Function Tests: No results for input(s): TSH, T4TOTAL, FREET4, T3FREE, THYROIDAB in the last 168 hours. Coagulation: No results for input(s): LABPROT, INR in the last 168 hours. Anemia Panel: No results for input(s): VITAMINB12, FOLATE, FERRITIN, TIBC, IRON, RETICCTPCT in the last 168 hours. Urine Drug Screen: Drugs of Abuse     Component Value Date/Time   LABOPIA POSITIVE* 11/07/2014 0857   COCAINSCRNUR POSITIVE* 11/07/2014 0857   LABBENZ POSITIVE* 11/07/2014 0857   AMPHETMU NONE DETECTED 11/07/2014 0857   THCU NONE DETECTED 11/07/2014 0857   LABBARB NONE DETECTED 11/07/2014 0857    Alcohol Level:  Recent Labs Lab 11/06/14 0735  ETH <5   Urinalysis: No results for input(s): COLORURINE, LABSPEC, PHURINE, GLUCOSEU, HGBUR, BILIRUBINUR, KETONESUR, PROTEINUR, UROBILINOGEN, NITRITE, LEUKOCYTESUR in the last 168 hours.  Invalid input(s): APPERANCEUR   Micro Results: Recent Results (from the past 240 hour(s))  MRSA PCR  Screening     Status: None   Collection Time: 11/06/14  2:20 PM  Result Value Ref Range Status   MRSA by PCR NEGATIVE NEGATIVE Final    Comment:        The GeneXpert MRSA Assay (FDA approved for NASAL specimens only), is one component of a comprehensive MRSA colonization surveillance program. It is not intended to diagnose MRSA infection nor to guide or monitor treatment for MRSA infections.    Studies/Results: Dg Chest 1 View  11/06/2014   CLINICAL DATA:  Intoxication.  EXAM: CHEST  1 VIEW  COMPARISON:  PA and lateral chest 08/12/2014.  FINDINGS: Heart size and mediastinal contours are within  normal limits. Both lungs are clear. Visualized skeletal structures are unremarkable.  IMPRESSION: Negative exam.   Electronically Signed   By: Drusilla Kanner M.D.   On: 11/06/2014 08:13   Ct Head Wo Contrast  11/06/2014   CLINICAL DATA:  Altered mental status.  Drug abuse.  EXAM: CT HEAD WITHOUT CONTRAST  TECHNIQUE: Contiguous axial images were obtained from the base of the skull through the vertex without intravenous contrast.  COMPARISON:  Head CT scan 10/26/2013.  FINDINGS: The brain appears normal without hemorrhage, infarct, mass lesion, mass effect, midline shift or abnormal extra-axial fluid collection. No hydrocephalus or pneumocephalus. The calvarium is intact. Mild mucosal thickening in the maxillary sinuses is identified. There is no pneumocephalus or hydrocephalus.  IMPRESSION: No acute abnormality.  Mild maxillary sinus disease.   Electronically Signed   By: Drusilla Kanner M.D.   On: 11/06/2014 08:48   Ct Cervical Spine Wo Contrast  11/06/2014   CLINICAL DATA:  Trauma CT head complaining of head and neck pain, passed out, cocaine abuse  EXAM: CT CERVICAL SPINE WITHOUT CONTRAST  TECHNIQUE: Multidetector CT imaging of the cervical spine was performed without intravenous contrast. Multiplanar CT image reconstructions were also generated.  COMPARISON:  None.  FINDINGS: Patient's head is rotated on the cervical spine. No paraspinous hematoma. Normal anterior-posterior alignment. No fracture. No soft tissue abnormalities. Lung apices are clear.  Mild C4-5 and C5-6 degenerative disc disease.  IMPRESSION: Mild degenerative change with no acute findings   Electronically Signed   By: Esperanza Heir M.D.   On: 11/06/2014 12:08   Medications: I have reviewed the patient's current medications. Scheduled Meds: . sodium chloride   Intravenous STAT  . heparin  5,000 Units Subcutaneous 3 times per day   Continuous Infusions:  PRN Meds:.acetaminophen, gi cocktail, LORazepam, nitroGLYCERIN, ondansetron  (ZOFRAN) IV Assessment/Plan: Chest pain Chest pain was likely secondary to coronary vasospasm due to cocaine. Troponins trended and negative x3, and nitroglycerin drip was discontinued. Chest pain is largely resolved this morning and patient complains more of diffuse muscle aches. Repeat EKG today showed no progression from admission EKG. - Continue avoid BB 2/2 cocaine abuse, avoid statin therapy in setting of myalgias and elevated LFTs acutely  Rhabdomyolysis Secondary to cocaine use. Neck ROM is normal today, pain is instead generalized with diffuse muscle aches. CK 3115, Potassium 5.8, AST 365 - Continue IV NS 150 mL/hr  Polysubstance abuse (cocaine, heroin, ethanol) Patient with evidence of opiate vs EtOH withdrawal scoring on CIWA protocol. Currently controlled at 1mg  ativan IV q2hrs PRN. - Ativan 1mg  IV q2hrs PRN for agitation, withdrawal symptoms  AKI (acute kidney injury) Renal function panel improving back to baseline except hyperkalemia to 5.8. No EKG changes from admission. Could be related to hypovolemia vs rhabdomyolysis vs cocaine induced vasospasm.  Bipolar disorder: Currently  not on medication  Diet: Regular DVT ppx: [place] Heparin FULL CODE  Dispo: Disposition is deferred at this time, awaiting improvement of current medical problems. Anticipated discharge in approximately 1-2 day(s).   The patient does not have a current PCP (No primary care provider on file.) and does not need an [facility] hospital follow-up appointment after discharge. Can get appointment a [facility]  The patient does not have transportation limitations that hinder transportation to clinic appointments.   LOS: 1 day   Fuller Plan, MD 11/07/2014, 2:03 PM

## 2014-11-07 NOTE — Progress Notes (Signed)
Pt is not on CIWA protocol.  Prior RN gave  ativan at [employer].  Pt still shaking,  Forgetful, headache, teeth pain.  Notified Md.  New orders received. Will continue to monitor Brandon [name]

## 2014-11-08 DIAGNOSIS — M6282 Rhabdomyolysis: Secondary | ICD-10-CM

## 2014-11-08 LAB — BASIC METABOLIC PANEL
Anion gap: 7 (ref 5–15)
BUN: 9 mg/dL (ref 6–20)
CALCIUM: 8.7 mg/dL — AB (ref 8.9–10.3)
CO2: 22 mmol/L (ref 22–32)
Chloride: 109 mmol/L (ref 101–111)
Creatinine, Ser: 0.86 mg/dL (ref 0.61–1.24)
GFR calc Af Amer: >60 mL/min (ref >60)
GLUCOSE: 93 mg/dL (ref 65–99)
POTASSIUM: 4 mmol/L (ref 3.5–5.1)
Sodium: 138 mmol/L (ref 135–145)

## 2014-11-08 LAB — CK: Total CK: 1678 U/L — ABNORMAL HIGH (ref 49–397)

## 2014-11-08 MED ORDER — LORAZEPAM 2 MG/ML IJ SOLN
1.0000 mg | Freq: Once | INTRAMUSCULAR | Status: AC
  Administered 2014-11-08: 1 mg via INTRAVENOUS

## 2014-11-08 MED ORDER — SODIUM CHLORIDE 0.9 % IV SOLN
INTRAVENOUS | Status: AC
Start: 2014-11-08 — End: 2014-11-09
  Administered 2014-11-08: 09:00:00 via INTRAVENOUS

## 2014-11-08 NOTE — Progress Notes (Signed)
Pt restless wanting to get up to chair.  Assisted pt up to chair.  Chair alarm on.  Call bell in reach.  Instructed pt to call if need anything.  Pt has Book bag beside him on floor.  Pt aware we are not responsible for anything valuable.  Pt has on/off drowsiness.  Will continue to monitor. Karena Addison T

## 2014-11-08 NOTE — Progress Notes (Signed)
Md called made aware.  Pt very anxious.  Shaking.  CIWA 23 Ativan dose of  not holding pt.  Will continue to monitor. Karena Addison T

## 2014-11-08 NOTE — Progress Notes (Signed)
Subjective: Remained agitated overnight, required frequent Ativan every 1-2 hours. Vitals stable. Complaining of neck pain and b/l Leg pain. No chest pain or sob.   Objective: Vital signs in last 24 hours: Filed Vitals:   11/08/14 0300 11/08/14 0400 11/08/14 0539 11/08/14 0800  BP: 136/89 142/95 138/99 134/89  Pulse:   113 63  Temp: 97.6 F (36.4 C)     TempSrc: Oral     Resp: 18 15  12   Height:      Weight:      SpO2: 98%      Weight change:   Intake/Output Summary (Last 24 hours) at 11/08/14 0845 Last data filed at 11/08/14 0500  Gross per 24 hour  Intake   3735 ml  Output   1851 ml  Net   1884 ml   GENERAL- restive, moving his legs frequently.  CARDIAC- RRR, no murmurs, rubs or gallops. RESP- Clear on anterior and lateral auscultation, no wheezes or crackles. ABDOMEN- Soft, no TTP, normal BS+. EXTREMITIES- pulse 2+, symmetric, no pedal edema, TTP over muscle groups on both legs.  SKIN- Warm, dry, No rash or lesion. PSYCH- Normal mood and affect, appropriate thought content and speech. Neuro: oriented to place and year. Pupils normal size and reactive.   Lab Results: Basic Metabolic Panel:  Recent Labs Lab 11/07/14 1325 11/08/14 0249  NA 137 138  K 4.4 4.0  CL 107 109  CO2 23 22  GLUCOSE 123* 93  BUN 11 9  CREATININE 0.93 0.86  CALCIUM 8.5* 8.7*   Liver Function Tests:  Recent Labs Lab 11/06/14 0735  AST 365*  ALT 80*  ALKPHOS 52  BILITOT 1.4*  PROT 7.5  ALBUMIN 3.8   No results for input(s): LIPASE, AMYLASE in the last 168 hours. No results for input(s): AMMONIA in the last 168 hours. CBC:  Recent Labs Lab 11/06/14 0735 11/06/14 0750  WBC 10.8*  --   NEUTROABS 8.5*  --   HGB 11.8* 12.9*  HCT 34.8* 38.0*  MCV 85.1  --   PLT 298  --    Cardiac Enzymes:  Recent Labs Lab 11/06/14 1330 11/06/14 1916 11/07/14 1325 11/08/14 0249  CKTOTAL  --   --  3115* 1678*  TROPONINI <0.03 <0.03  --   --    BNP: No results for input(s):  PROBNP in the last 168 hours. D-Dimer: No results for input(s): DDIMER in the last 168 hours. CBG: No results for input(s): GLUCAP in the last 168 hours. Hemoglobin A1C: No results for input(s): HGBA1C in the last 168 hours. Fasting Lipid Panel: No results for input(s): CHOL, HDL, LDLCALC, TRIG, CHOLHDL, LDLDIRECT in the last 168 hours. Thyroid Function Tests: No results for input(s): TSH, T4TOTAL, FREET4, T3FREE, THYROIDAB in the last 168 hours. Coagulation: No results for input(s): LABPROT, INR in the last 168 hours. Anemia Panel: No results for input(s): VITAMINB12, FOLATE, FERRITIN, TIBC, IRON, RETICCTPCT in the last 168 hours. Urine Drug Screen: Drugs of Abuse     Component Value Date/Time   LABOPIA POSITIVE* 11/07/2014 0857   COCAINSCRNUR POSITIVE* 11/07/2014 0857   LABBENZ POSITIVE* 11/07/2014 0857   AMPHETMU NONE DETECTED 11/07/2014 0857   THCU NONE DETECTED 11/07/2014 0857   LABBARB NONE DETECTED 11/07/2014 0857    Alcohol Level:  Recent Labs Lab 11/06/14 0735  ETH <5   Urinalysis: No results for input(s): COLORURINE, LABSPEC, PHURINE, GLUCOSEU, HGBUR, BILIRUBINUR, KETONESUR, PROTEINUR, UROBILINOGEN, NITRITE, LEUKOCYTESUR in the last 168 hours.  Invalid input(s): APPERANCEUR  Micro Results: Recent Results (from the past 240 hour(s))  MRSA PCR Screening     Status: None   Collection Time: 11/06/14  2:20 PM  Result Value Ref Range Status   MRSA by PCR NEGATIVE NEGATIVE Final    Comment:        The GeneXpert MRSA Assay (FDA approved for NASAL specimens only), is one component of a comprehensive MRSA colonization surveillance program. It is not intended to diagnose MRSA infection nor to guide or monitor treatment for MRSA infections.    Studies/Results: Ct Cervical Spine Wo Contrast  11/06/2014   CLINICAL DATA:  Trauma CT head complaining of head and neck pain, passed out, cocaine abuse  EXAM: CT CERVICAL SPINE WITHOUT CONTRAST  TECHNIQUE: Multidetector  CT imaging of the cervical spine was performed without intravenous contrast. Multiplanar CT image reconstructions were also generated.  COMPARISON:  None.  FINDINGS: Patient's head is rotated on the cervical spine. No paraspinous hematoma. Normal anterior-posterior alignment. No fracture. No soft tissue abnormalities. Lung apices are clear.  Mild C4-5 and C5-6 degenerative disc disease.  IMPRESSION: Mild degenerative change with no acute findings   Electronically Signed   By: Esperanza Heir M.D.   On: 11/06/2014 12:08   Medications: I have reviewed the patient's current medications. Scheduled Meds: . baclofen  5 mg Oral TID  . diclofenac sodium  2 g Topical QID  . folic acid  1 mg Oral Daily  . heparin  5,000 Units Subcutaneous 3 times per day  . multivitamin with minerals  1 tablet Oral Daily  . thiamine  100 mg Oral Daily   Or  . thiamine  100 mg Intravenous Daily   Continuous Infusions:  PRN Meds:.acetaminophen, gi cocktail, LORazepam, nitroGLYCERIN, ondansetron (ZOFRAN) IV Assessment/Plan:  Rhabdomyolysis Secondary to cocaine use vs fall?. Neck ROM is normal today, pain is instead generalized with diffuse muscle aches. CK was elevated in 3000's, but trending down now, also had mild hyperkalemia and AST elevation all likely 2/2 to rhabdo which are improving with fluids. - Continue IV NS 150 mL/hr  Chest pain - resolved  Chest pain was likely secondary to coronary vasospasm due to cocaine. EKG unchanged and trops negative. Was no nitro drip until tropx3 were negative.   - Continue avoid BB 2/2 cocaine abuse, avoid statin therapy in setting of myalgias and elevated LFTs acutely  Polysubstance abuse (cocaine, heroin, ethanol) Patient with evidence of opiate vs EtOH withdrawal scoring on CIWA protocol. Currently controlled on Ativan. - Ativan  IV q1hrs PRN for agitation, withdrawal symptoms  AKI (acute kidney injury) - likely 2/2 to dehydration and rhabdo -resolved with  fluids.  Bipolar disorder: Currently not on medication  Diet: Regular DVT ppx: [place] Heparin FULL CODE  Dispo: Disposition is deferred at this time, awaiting improvement of current medical problems. Anticipated discharge in approximately 1-2 day(s).   The patient does not have a current PCP (No primary care provider on file.) and does not need an [facility] hospital follow-up appointment after discharge. Can get appointment a [facility]  The patient does not have transportation limitations that hinder transportation to clinic appointments.   LOS: 2 days   Brandon Meeker, MD 11/08/2014, 8:45 AM

## 2014-11-09 LAB — CK: CK TOTAL: 674 U/L — AB (ref 49–397)

## 2014-11-09 LAB — COMPREHENSIVE METABOLIC PANEL
ALBUMIN: 3.3 g/dL — AB (ref 3.5–5.0)
ALT: 46 U/L (ref 17–63)
ANION GAP: 7 (ref 5–15)
AST: 76 U/L — ABNORMAL HIGH (ref 15–41)
Alkaline Phosphatase: 45 U/L (ref 38–126)
BILIRUBIN TOTAL: 0.9 mg/dL (ref 0.3–1.2)
BUN: 9 mg/dL (ref 6–20)
CO2: 21 mmol/L — ABNORMAL LOW (ref 22–32)
Calcium: 9 mg/dL (ref 8.9–10.3)
Chloride: 110 mmol/L (ref 101–111)
Creatinine, Ser: 0.96 mg/dL (ref 0.61–1.24)
GLUCOSE: 100 mg/dL — AB (ref 65–99)
POTASSIUM: 4.8 mmol/L (ref 3.5–5.1)
Sodium: 138 mmol/L (ref 135–145)
TOTAL PROTEIN: 6.4 g/dL — AB (ref 6.5–8.1)

## 2014-11-09 NOTE — Progress Notes (Signed)
Pt called out needing to go home to help a "brother in need" .  Wanted to talk with doctor.  Educated pt the reason he needed to stay, however pt still wants to leave.  bp 155/115 hr 71, rr 17 sats 98 on RA.  Will continue to monitor. Karena Addison T

## 2014-11-09 NOTE — Progress Notes (Addendum)
Md talked with pt about leaving AMA.  Pt talked with someone on phone and decided he would be leaving AMA. Pt's belongings picked up from safe and counted with pt.   Karena Addison T

## 2014-11-09 NOTE — Progress Notes (Signed)
Notified by RN that patient requesting to speak with MD regarding leaving AMA.  Patient states he is upset that his friend is dying in prison in [place].  He was told that his friend will not survive long and to come to [place] as soon as he can get there.  Patient asked what he was still being treated for and whether it was life threatening.  He asked whether he could just establish care at a hospital in [place] if necessary.  Patient informed that he was being treated for rhabdomyolysis and substance withdrawal.  He was informed that he still required IV fluids in order to treat the rhabdo.  He was also informed about concerns for continued withdrawal from cocaine.  Previously in the admission, he had required frequent doses of Ativan for agitation surrounding cocaine withdrawal.  He denies other substances, including alcohol or other benzos.  He was informed that we cannot give him Ativan or other benzos if he leaves AMA and that he could experience further agitation/withdrawal if he leaves.  He denies a history of seizures.  He was informed that, should he choose to leave, it would be against medical advice.  He was told that he should establish care with a physician for monitoring of his rhabdo.  He stated that he would only be without a doctor for the time it took him to reach [place], at which time he would go to another hospital.  He was informed he needs to drink lots of fluids, especially lots of water, to protect his kidneys from rhabdo.  He was told to seek immediate medical attention if he started to experience agitation, confusion, balance issues, tremors, nausea, vomiting, dysuria, decreased urine output, or chest pain.  Patient stated that he would need a few minutes to think about it and make his decision.

## 2014-11-12 ENCOUNTER — Encounter (HOSPITAL_COMMUNITY): Payer: Self-pay | Admitting: Emergency Medicine

## 2014-11-12 ENCOUNTER — Emergency Department (HOSPITAL_COMMUNITY): Payer: Self-pay

## 2014-11-12 ENCOUNTER — Emergency Department (HOSPITAL_COMMUNITY)
Admission: EM | Admit: 2014-11-12 | Discharge: 2014-11-13 | Disposition: A | Discharge status: Home / Self Care | Payer: Self-pay | Attending: Emergency Medicine | Admitting: Emergency Medicine

## 2014-11-12 ENCOUNTER — Other Ambulatory Visit: Payer: Self-pay

## 2014-11-12 DIAGNOSIS — R079 Chest pain, unspecified: Secondary | ICD-10-CM | POA: Insufficient documentation

## 2014-11-12 DIAGNOSIS — Z8719 Personal history of other diseases of the digestive system: Secondary | ICD-10-CM | POA: Insufficient documentation

## 2014-11-12 DIAGNOSIS — S0990XA Unspecified injury of head, initial encounter: Secondary | ICD-10-CM | POA: Insufficient documentation

## 2014-11-12 DIAGNOSIS — Z87448 Personal history of other diseases of urinary system: Secondary | ICD-10-CM | POA: Insufficient documentation

## 2014-11-12 DIAGNOSIS — Z8739 Personal history of other diseases of the musculoskeletal system and connective tissue: Secondary | ICD-10-CM | POA: Insufficient documentation

## 2014-11-12 DIAGNOSIS — Y929 Unspecified place or not applicable: Secondary | ICD-10-CM | POA: Insufficient documentation

## 2014-11-12 DIAGNOSIS — R21 Rash and other nonspecific skin eruption: Secondary | ICD-10-CM | POA: Insufficient documentation

## 2014-11-12 DIAGNOSIS — M79661 Pain in right lower leg: Secondary | ICD-10-CM | POA: Insufficient documentation

## 2014-11-12 DIAGNOSIS — F419 Anxiety disorder, unspecified: Secondary | ICD-10-CM | POA: Insufficient documentation

## 2014-11-12 DIAGNOSIS — R109 Unspecified abdominal pain: Secondary | ICD-10-CM | POA: Insufficient documentation

## 2014-11-12 DIAGNOSIS — Y999 Unspecified external cause status: Secondary | ICD-10-CM | POA: Insufficient documentation

## 2014-11-12 DIAGNOSIS — Y939 Activity, unspecified: Secondary | ICD-10-CM | POA: Insufficient documentation

## 2014-11-12 DIAGNOSIS — F1410 Cocaine abuse, uncomplicated: Secondary | ICD-10-CM | POA: Insufficient documentation

## 2014-11-12 DIAGNOSIS — S60512A Abrasion of left hand, initial encounter: Secondary | ICD-10-CM | POA: Insufficient documentation

## 2014-11-12 DIAGNOSIS — S60511A Abrasion of right hand, initial encounter: Secondary | ICD-10-CM | POA: Insufficient documentation

## 2014-11-12 DIAGNOSIS — W19XXXA Unspecified fall, initial encounter: Secondary | ICD-10-CM | POA: Insufficient documentation

## 2014-11-12 LAB — BASIC METABOLIC PANEL
ANION GAP: 11 (ref 5–15)
BUN: 10 mg/dL (ref 6–20)
CO2: 25 mmol/L (ref 22–32)
Calcium: 9.1 mg/dL (ref 8.9–10.3)
Chloride: 98 mmol/L — ABNORMAL LOW (ref 101–111)
Creatinine, Ser: 1.01 mg/dL (ref 0.61–1.24)
GLUCOSE: 105 mg/dL — AB (ref 65–99)
POTASSIUM: 3.6 mmol/L (ref 3.5–5.1)
Sodium: 134 mmol/L — ABNORMAL LOW (ref 135–145)

## 2014-11-12 LAB — I-STAT TROPONIN, ED: TROPONIN I, POC: 0.01 ng/mL (ref 0.00–0.08)

## 2014-11-12 LAB — CBC
HEMATOCRIT: 35.4 % — AB (ref 39.0–52.0)
Hemoglobin: 12 g/dL — ABNORMAL LOW (ref 13.0–17.0)
MCH: 29.3 pg (ref 26.0–34.0)
MCHC: 33.9 g/dL (ref 30.0–36.0)
MCV: 86.3 fL (ref 78.0–100.0)
Platelets: 476 10*3/uL — ABNORMAL HIGH (ref 150–400)
RBC: 4.1 MIL/uL — AB (ref 4.22–5.81)
RDW: 15.2 % (ref 11.5–15.5)
WBC: 11 10*3/uL — AB (ref 4.0–10.5)

## 2014-11-12 LAB — I-STAT CG4 LACTIC ACID, ED: Lactic Acid, Venous: 2 mmol/L (ref 0.5–2.0)

## 2014-11-12 MED ORDER — SODIUM CHLORIDE 0.9 % IV BOLUS (SEPSIS)
2000.0000 mL | Freq: Once | INTRAVENOUS | Status: AC
  Administered 2014-11-12: 2000 mL via INTRAVENOUS

## 2014-11-12 MED ORDER — LORAZEPAM 2 MG/ML IJ SOLN
2.0000 mg | Freq: Once | INTRAMUSCULAR | Status: AC
  Administered 2014-11-12: 2 mg via INTRAVENOUS
  Filled 2014-11-12: qty 1

## 2014-11-12 NOTE — ED Notes (Signed)
Pt presents POV for cocaine use three hours ago. Reports fall with LOC, c/o chest and neck pain, numbness in right calf.

## 2014-11-12 NOTE — ED Provider Notes (Signed)
History  This chart was scribed for Brandon Crumble, MD by Karle Plumber, ED Scribe. This patient was seen in room WA15/WA15 and the patient's care was started at 11:09 PM.  Chief Complaint  Patient presents with  . Addiction Problem  . Cocaine use    HPI  HPI Comments:  Brandon [name]. is a 49 y.o. male with PMHx of drug abuse who presents to the Emergency Department complaining of CP secondary to cocaine use PTA. He reports using approximately "$600-$800 worth". He reports associated abdominal pain, severe HA and severe right calf pain and swelling that began yesterday. Pt reports an unknown length of LOC after the cocaine use. He is unsure if he hit his head when he had the syncopal episode. He denies modifying factors of his symptoms. He denies alcohol use today.   Past Medical History  Diagnosis Date  . Drug abuse   . Kidney failure   . Bipolar 1 disorder   . Rhabdomyolysis   . Acute renal failure   . Opiate abuse, continuous   . Dental caries     extensive  . Anxiety   . Depression   . Bipolar affective disorder 03/17/2012  . Mood disorder 03/16/2012   Past Surgical History  Procedure Laterality Date  . Knee surgery      x 5  . Hernia repair     Family History  Problem Relation Age of Onset  . Heart disease Mother   . Heart disease Father    Social History  Substance Use Topics  . Smoking status: Never Smoker   . Smokeless tobacco: Never Used  . Alcohol Use: No    Review of Systems A complete 10 system review of systems was obtained and all systems are negative except as noted in the HPI and PMH.   Allergies  Contrast media; Food; and Hydrocodone  Home Medications   Prior to Admission medications   Medication Sig Start Date End Date Taking? Authorizing Provider  buprenorphine (SUBUTEX) 2 MG SUBL SL tablet Place 2 mg under the tongue daily as needed (for symptoms).  09/12/14   Historical Provider, MD  cloNIDine (CATAPRES) 0.1 MG tablet Take 2 tablets (0.2 mg  total) by mouth daily. Patient not taking: Reported on 11/06/2014 10/04/14   Melene Plan, DO  diazepam (VALIUM) 5 MG tablet Take 1 tablet (5 mg total) by mouth every 6 (six) hours as needed for anxiety (spasms). Patient not taking: Reported on 11/06/2014 10/04/14   Melene Plan, DO  zolpidem (AMBIEN) 10 MG tablet Take 10 mg by mouth at bedtime as needed for sleep.     Historical Provider, MD   Triage Vitals: BP 108/63 mmHg  Pulse 71  Temp(Src) 97.6 F (36.4 C) (Oral)  Resp 22  Ht 6\' 3"  (1.905 m)  Wt 220 lb (99.791 kg)  BMI 27.50 kg/m2  SpO2 97% Physical Exam  Constitutional: He is oriented to person, place, and time. Vital signs are normal. He appears well-developed and well-nourished.  Non-toxic appearance. He does not appear ill. He appears distressed.  Clinically intoxicated  HENT:  Head: Normocephalic and atraumatic.  Nose: Nose normal.  Mouth/Throat: Oropharynx is clear and moist. No oropharyngeal exudate.  Eyes: Conjunctivae and EOM are normal. Pupils are equal, round, and reactive to light. No scleral icterus.  Neck: Normal range of motion. Neck supple. No tracheal deviation, no edema, no erythema and normal range of motion present. No thyroid mass and no thyromegaly present.  Cardiovascular: Normal rate, regular rhythm,  S1 normal, S2 normal, normal heart sounds, intact distal pulses and normal pulses.  Exam reveals no gallop and no friction rub.   No murmur heard. Pulses:      Radial pulses are 2+ on the right side, and 2+ on the left side.       Dorsalis pedis pulses are 2+ on the right side, and 2+ on the left side.  Pulmonary/Chest: Effort normal and breath sounds normal. No respiratory distress. He has no wheezes. He has no rhonchi. He has no rales.  Abdominal: Soft. Normal appearance and bowel sounds are normal. He exhibits no distension, no ascites and no mass. There is no hepatosplenomegaly. There is no tenderness. There is no rebound, no guarding and no CVA tenderness.   Musculoskeletal: Normal range of motion. He exhibits tenderness (right calf tenderness to palpation). He exhibits no edema.  Lymphadenopathy:    He has no cervical adenopathy.  Neurological: He is alert and oriented to person, place, and time. He has normal strength. No cranial nerve deficit or sensory deficit. He exhibits normal muscle tone.  Skin: Skin is warm, dry and intact. Rash (bilateral hands) noted. No petechiae noted. He is not diaphoretic. No erythema. No pallor.  Multiple abrasions to bilateral palms  Nursing note and vitals reviewed.   ED Course  Procedures (including critical care time) DIAGNOSTIC STUDIES: Oxygen Saturation is 97% on RA, normal by my interpretation.   COORDINATION OF CARE: 11:17 PM- Will order labs and imaging. Pt verbalizes understanding and agrees to plan.  Medications  LORazepam (ATIVAN) injection 2 mg (not administered)  sodium chloride 0.9 % bolus 2,000 mL (not administered)   Labs Review Labs Reviewed  BASIC METABOLIC PANEL - Abnormal; Notable for the following:    Sodium 134 (*)    Chloride 98 (*)    Glucose, Bld 105 (*)    All other components within normal limits  CBC - Abnormal; Notable for the following:    WBC 11.0 (*)    RBC 4.10 (*)    Hemoglobin 12.0 (*)    HCT 35.4 (*)    Platelets 476 (*)    All other components within normal limits  CK - Abnormal; Notable for the following:    Total CK 485 (*)    All other components within normal limits  MYOGLOBIN, SERUM  I-STAT CG4 LACTIC ACID, ED  I-STAT TROPOININ, ED  I-STAT CG4 LACTIC ACID, ED    Imaging Review Dg Chest 2 View  11/12/2014   CLINICAL DATA:  Altered mental status.  Upper chest and back pain.  EXAM: CHEST  2 VIEW  COMPARISON:  11/06/2014  FINDINGS: Lung volumes are low. The cardiomediastinal contours are normal. The lungs are clear. Pulmonary vasculature is normal. No consolidation, pleural effusion, or pneumothorax. No acute osseous abnormalities are seen.   IMPRESSION: Hypoventilatory chest without acute process.   Electronically Signed   By: Rubye Oaks M.D.   On: 11/12/2014 23:42   Ct Head Wo Contrast  11/13/2014   CLINICAL DATA:  Fall with loss of consciousness. Recent cocaine use.  EXAM: CT HEAD WITHOUT CONTRAST  CT CERVICAL SPINE WITHOUT CONTRAST  TECHNIQUE: Multidetector CT imaging of the head and cervical spine was performed following the standard protocol without intravenous contrast. Multiplanar CT image reconstructions of the cervical spine were also generated.  COMPARISON:  CT 11/06/2014  FINDINGS: CT HEAD FINDINGS  No intracranial hemorrhage, mass effect, or midline shift. No hydrocephalus. The basilar cisterns are patent. No evidence of territorial infarct.  No intracranial fluid collection. Calvarium is intact. Again seen mucosal thickening involving the right maxillary sinus. Mastoid air cells are well aerated.  CT CERVICAL SPINE FINDINGS  Cervical spine alignment is maintained. Vertebral body heights are preserved. Mild disc space narrowing at C4-C5, unchanged. There is no fracture. The dens is intact. There are no jumped or perched facets. No prevertebral soft tissue edema.  IMPRESSION: 1.  No acute intracranial abnormality. 2. No fracture or subluxation of the cervical spine.   Electronically Signed   By: Rubye Oaks M.D.   On: 11/13/2014 00:27   Ct Cervical Spine Wo Contrast  11/13/2014   CLINICAL DATA:  Fall with loss of consciousness. Recent cocaine use.  EXAM: CT HEAD WITHOUT CONTRAST  CT CERVICAL SPINE WITHOUT CONTRAST  TECHNIQUE: Multidetector CT imaging of the head and cervical spine was performed following the standard protocol without intravenous contrast. Multiplanar CT image reconstructions of the cervical spine were also generated.  COMPARISON:  CT 11/06/2014  FINDINGS: CT HEAD FINDINGS  No intracranial hemorrhage, mass effect, or midline shift. No hydrocephalus. The basilar cisterns are patent. No evidence of territorial  infarct. No intracranial fluid collection. Calvarium is intact. Again seen mucosal thickening involving the right maxillary sinus. Mastoid air cells are well aerated.  CT CERVICAL SPINE FINDINGS  Cervical spine alignment is maintained. Vertebral body heights are preserved. Mild disc space narrowing at C4-C5, unchanged. There is no fracture. The dens is intact. There are no jumped or perched facets. No prevertebral soft tissue edema.  IMPRESSION: 1.  No acute intracranial abnormality. 2. No fracture or subluxation of the cervical spine.   Electronically Signed   By: Rubye Oaks M.D.   On: 11/13/2014 00:27   I have personally reviewed and evaluated these images and lab results as part of my medical decision-making.   EKG Interpretation   Date/Time:  Friday November 13 2014 00:46:21 EDT Ventricular Rate:  79 PR Interval:  147 QRS Duration: 88 QT Interval:  405 QTC Calculation: 464 R Axis:   24 Text Interpretation:  Sinus rhythm Consider left atrial enlargement ST  elev, probable normal early repol pattern No significant change since last  tracing Confirmed by Erroll Luna [phone]) on 11/13/2014 1:15:15 AM      MDM   Final diagnoses:  None     Patient presents to the ED for multiple symptoms after cocaine use.  He believes he overdosed.  He states he has chest pain and a headache after a fall.  Also with severe R calf tenderness.  Will evaluate with CK and myoglobin, CT of head and he was given ativan for cocaine induced chest pain.  Chest xray does not show "crack lung", EKG is pending.  Work up is negative.  Patient will be observed until sober and likely DC'ed in the morning.  I personally performed the services described in this documentation, which was scribed in my presence. The recorded information has been reviewed and is accurate.     Brandon Crumble, MD 11/13/14 [phone]

## 2014-11-12 NOTE — ED Notes (Signed)
Patient states he thinks he overdosed on cocaine, did 600$ cocaine over last 2 days.  Patient states he passed out and does not know what happened.  Patient states he vomited x 1.

## 2014-11-13 LAB — I-STAT CG4 LACTIC ACID, ED: LACTIC ACID, VENOUS: 0.55 mmol/L (ref 0.5–2.0)

## 2014-11-13 LAB — CK: Total CK: 485 U/L — ABNORMAL HIGH (ref 49–397)

## 2014-11-13 NOTE — Discharge Instructions (Signed)
Cocaine Abuse Brandon [name], see a primary care doctor within 3 days to help you to quit using cocaine.  Call [facility] is you need immediate help.  Return to the ED for any worsening.  Thank you. Cocaine is one of a group of powerful drugs called stimulants. Cocaine has medical uses for stopping nosebleeds and for pain control before minor nose or dental surgery. However, cocaine is misused because of the effects that it produces. These effects include:   A feeling of extreme pleasure.  Alertness.  High energy. Common street names for cocaine include coke, crack, blow, snow, and nose candy. Cocaine is snorted, dissolved in water and injected, or smoked.  Stimulants are addictive because they activate regions of the brain that produce both the pleasurable sensation of "reward" and psychological dependence. Together, these actions account for loss of control and the rapid development of drug dependence. This means you become ill without the drug (withdrawal) and need to keep using it to function.  Stimulant use disorder is use of stimulants that disrupts your daily life. It disrupts relationships with family and friends and how you do your job. Cocaine increases your blood pressure and heart rate. It can cause a heart attack or stroke. Cocaine can also cause death from irregular heart rate or seizures. SYMPTOMS Symptoms of stimulant use disorder with cocaine include:  Use of cocaine in larger amounts or over a longer period of time than intended.  Unsuccessful attempts to cut down or control cocaine use.  A lot of time spent obtaining, using, or recovering from the effects of cocaine.  A strong desire or urge to use cocaine (craving).  Continued use of cocaine in spite of major problems at work, school, or home because of use.  Continued use of cocaine in spite of relationship problems because of use.  Giving up or cutting down on important life activities because of cocaine use.  Use of  cocaine over and over in situations when it is physically hazardous, such as driving a car.  Continued use of cocaine in spite of a physical problem that is likely related to use. Physical problems can include:  Malnutrition.  Nosebleeds.  Chest pain.  High blood pressure.  A hole that develops between the part of your nose that separates your nostrils (perforated nasal septum).  Lung and kidney damage.  Continued use of cocaine in spite of a mental problem that is likely related to use. Mental problems can include:  Schizophrenia-like symptoms.  Depression.  Bipolar mood swings.  Anxiety.  Sleep problems.  Need to use more and more cocaine to get the same effect, or lessened effect over time with use of the same amount of cocaine (tolerance).  Having withdrawal symptoms when cocaine use is stopped, or using cocaine to reduce or avoid withdrawal symptoms. Withdrawal symptoms include:  Depressed or irritable mood.  Low energy or restlessness.  Bad dreams.  Poor or excessive sleep.  Increased appetite. DIAGNOSIS Stimulant use disorder is diagnosed by your health care provider. You may be asked questions about your cocaine use and how it affects your life. A physical exam may be done. A drug screen may be ordered. You may be referred to a mental health professional. The diagnosis of stimulant use disorder requires at least two symptoms within 12 months. The type of stimulant use disorder depends on the number of signs and symptoms you have. The type may be:  Mild. Two or three signs and symptoms.  Moderate. Four or five  signs and symptoms.  Severe. Six or more signs and symptoms. TREATMENT Treatment for stimulant use disorder is usually provided by mental health professionals with training in substance use disorders. The following options are available:  Counseling or talk therapy. Talk therapy addresses the reasons you use cocaine and ways to keep you from using  again. Goals of talk therapy include:  Identifying and avoiding triggers for use.  Handling cravings.  Replacing use with healthy activities.  Support groups. Support groups provide emotional support, advice, and guidance.  Medicine. Certain medicines may decrease cocaine cravings or withdrawal symptoms. HOME CARE INSTRUCTIONS  Take medicines only as directed by your health care provider.  Identify the people and activities that trigger your cocaine use and avoid them.  Keep all follow-up visits as directed by your health care provider. SEEK MEDICAL CARE IF:  Your symptoms get worse or you relapse.  You are not able to take medicines as directed. SEEK IMMEDIATE MEDICAL CARE IF:  You have serious thoughts about hurting yourself or others.  You have a seizure, chest pain, sudden weakness, or loss of speech or vision. FOR MORE INFORMATION  National Institute on Drug Abuse: http://www.price-smith.com/  Substance Abuse and Mental Health Services Administration: SkateOasis.com.pt Document Released: 02/11/2000 Document Revised: 06/30/2013 Document Reviewed: 02/26/2013 [facility] Patient Information 2015 [place], [place]. This information is not intended to replace advice given to you by your health care provider. Make sure you discuss any questions you have with your health care provider. Substance Abuse Treatment Programs  Intensive Outpatient Programs [facility]     [address]      [place], [place]                   [phone]       [facility] [address] [place], [place] [phone]  [facility]     (Inpatient and outpatient)     [address]           [phone]    [facility] [phone] (Suboxone and Methadone)  [address]      [place], [place] [address]      [phone]       [address] [place], [place] [phone]  [facility]  (Outpatient/Inpatient, [facility])    (insurance only) [phone]             [facility] (Groups & Residential) [place], [place] [phone]     [facility]     [address]     [place], [place]      [phone]       [facility] (for caregivers and family) [phone] [place], [place] [phone]

## 2014-11-14 LAB — MYOGLOBIN, SERUM: Myoglobin: 69 ng/mL (ref 28–72)

## 2016-02-28 DEATH — deceased

## 2017-09-21 ENCOUNTER — Telehealth: Payer: Self-pay | Admitting: *Deleted

## 2017-09-21 NOTE — Telephone Encounter (Signed)
Error, note for Loreen FreudJohn Hafen Sr./SLS

## 2022-01-11 ENCOUNTER — Other Ambulatory Visit (HOSPITAL_COMMUNITY): Payer: Self-pay
